# Patient Record
Sex: Female | Born: 1965 | Race: Black or African American | Hispanic: No | Smoking: Never smoker
Health system: Southern US, Community
[De-identification: ages and names within clinical notes are randomized; demographics above are authoritative.]

## PROBLEM LIST (undated history)

## (undated) DIAGNOSIS — R609 Edema, unspecified: Secondary | ICD-10-CM

## (undated) DIAGNOSIS — I519 Heart disease, unspecified: Secondary | ICD-10-CM

## (undated) DIAGNOSIS — I5189 Other ill-defined heart diseases: Secondary | ICD-10-CM

## (undated) DIAGNOSIS — I10 Essential (primary) hypertension: Secondary | ICD-10-CM

## (undated) DIAGNOSIS — E785 Hyperlipidemia, unspecified: Secondary | ICD-10-CM

## (undated) DIAGNOSIS — F329 Major depressive disorder, single episode, unspecified: Secondary | ICD-10-CM

## (undated) DIAGNOSIS — J302 Other seasonal allergic rhinitis: Secondary | ICD-10-CM

## (undated) DIAGNOSIS — J4520 Mild intermittent asthma, uncomplicated: Secondary | ICD-10-CM

## (undated) DIAGNOSIS — M1990 Unspecified osteoarthritis, unspecified site: Secondary | ICD-10-CM

## (undated) DIAGNOSIS — G4730 Sleep apnea, unspecified: Secondary | ICD-10-CM

## (undated) DIAGNOSIS — F32A Depression, unspecified: Secondary | ICD-10-CM

## (undated) DIAGNOSIS — E669 Obesity, unspecified: Secondary | ICD-10-CM

## (undated) DIAGNOSIS — J309 Allergic rhinitis, unspecified: Secondary | ICD-10-CM

## (undated) DIAGNOSIS — E1169 Type 2 diabetes mellitus with other specified complication: Secondary | ICD-10-CM

## (undated) DIAGNOSIS — F419 Anxiety disorder, unspecified: Secondary | ICD-10-CM

## (undated) DIAGNOSIS — T4145XA Adverse effect of unspecified anesthetic, initial encounter: Secondary | ICD-10-CM

## (undated) DIAGNOSIS — T8859XA Other complications of anesthesia, initial encounter: Secondary | ICD-10-CM

## (undated) DIAGNOSIS — J189 Pneumonia, unspecified organism: Secondary | ICD-10-CM

## (undated) DIAGNOSIS — D649 Anemia, unspecified: Secondary | ICD-10-CM

## (undated) HISTORY — DX: Edema, unspecified: R60.9

## (undated) HISTORY — PX: ABDOMINAL SURGERY: SHX537

## (undated) HISTORY — PX: KNEE ARTHROSCOPY: SUR90

## (undated) HISTORY — DX: Depression, unspecified: F32.A

## (undated) HISTORY — DX: Type 2 diabetes mellitus with other specified complication: E11.69

## (undated) HISTORY — DX: Heart disease, unspecified: I51.9

## (undated) HISTORY — DX: Other seasonal allergic rhinitis: J30.2

## (undated) HISTORY — DX: Major depressive disorder, single episode, unspecified: F32.9

## (undated) HISTORY — DX: Hyperlipidemia, unspecified: E78.5

## (undated) HISTORY — DX: Anxiety disorder, unspecified: F41.9

## (undated) HISTORY — PX: CHOLECYSTECTOMY: SHX55

## (undated) HISTORY — DX: Other ill-defined heart diseases: I51.89

## (undated) HISTORY — PX: OOPHORECTOMY: SHX86

## (undated) HISTORY — DX: Morbid (severe) obesity due to excess calories: E66.01

## (undated) HISTORY — PX: OTHER SURGICAL HISTORY: SHX169

## (undated) HISTORY — DX: Type 2 diabetes mellitus with other specified complication: E66.9

## (undated) HISTORY — DX: Allergic rhinitis, unspecified: J30.9

## (undated) HISTORY — DX: Mild intermittent asthma, uncomplicated: J45.20

## (undated) HISTORY — DX: Anemia, unspecified: D64.9

---

## 1997-08-29 ENCOUNTER — Ambulatory Visit (HOSPITAL_BASED_OUTPATIENT_CLINIC_OR_DEPARTMENT_OTHER): Admission: RE | Admit: 1997-08-29 | Discharge: 1997-08-29 | Payer: Self-pay | Admitting: *Deleted

## 1998-09-05 ENCOUNTER — Ambulatory Visit (HOSPITAL_COMMUNITY): Admission: RE | Admit: 1998-09-05 | Discharge: 1998-09-05 | Payer: Self-pay | Admitting: *Deleted

## 1998-10-03 ENCOUNTER — Other Ambulatory Visit: Admission: RE | Admit: 1998-10-03 | Discharge: 1998-10-03 | Payer: Self-pay | Admitting: *Deleted

## 1999-02-11 ENCOUNTER — Encounter: Admission: RE | Admit: 1999-02-11 | Discharge: 1999-02-11 | Payer: Self-pay | Admitting: Hematology and Oncology

## 1999-04-02 ENCOUNTER — Encounter: Admission: RE | Admit: 1999-04-02 | Discharge: 1999-04-02 | Payer: Self-pay | Admitting: Internal Medicine

## 1999-06-04 ENCOUNTER — Encounter: Admission: RE | Admit: 1999-06-04 | Discharge: 1999-06-04 | Payer: Self-pay | Admitting: Hematology and Oncology

## 2000-05-07 ENCOUNTER — Inpatient Hospital Stay (HOSPITAL_COMMUNITY): Admission: AD | Admit: 2000-05-07 | Discharge: 2000-05-07 | Payer: Self-pay | Admitting: *Deleted

## 2000-05-08 ENCOUNTER — Encounter: Payer: Self-pay | Admitting: Obstetrics

## 2000-05-08 ENCOUNTER — Ambulatory Visit (HOSPITAL_COMMUNITY): Admission: RE | Admit: 2000-05-08 | Discharge: 2000-05-08 | Payer: Self-pay | Admitting: Obstetrics

## 2000-06-07 ENCOUNTER — Other Ambulatory Visit: Admission: RE | Admit: 2000-06-07 | Discharge: 2000-06-07 | Payer: Self-pay | Admitting: Obstetrics and Gynecology

## 2000-06-13 ENCOUNTER — Inpatient Hospital Stay (HOSPITAL_COMMUNITY): Admission: AD | Admit: 2000-06-13 | Discharge: 2000-06-13 | Payer: Self-pay | Admitting: Obstetrics and Gynecology

## 2000-07-02 ENCOUNTER — Inpatient Hospital Stay (HOSPITAL_COMMUNITY): Admission: AD | Admit: 2000-07-02 | Discharge: 2000-07-02 | Payer: Self-pay | Admitting: Obstetrics and Gynecology

## 2000-07-29 ENCOUNTER — Ambulatory Visit (HOSPITAL_COMMUNITY): Admission: RE | Admit: 2000-07-29 | Discharge: 2000-07-29 | Payer: Self-pay | Admitting: Obstetrics and Gynecology

## 2000-07-29 ENCOUNTER — Encounter: Payer: Self-pay | Admitting: Obstetrics and Gynecology

## 2000-10-18 ENCOUNTER — Ambulatory Visit (HOSPITAL_COMMUNITY): Admission: RE | Admit: 2000-10-18 | Discharge: 2000-10-18 | Payer: Self-pay | Admitting: Obstetrics and Gynecology

## 2000-10-18 ENCOUNTER — Encounter: Payer: Self-pay | Admitting: Obstetrics and Gynecology

## 2000-11-15 ENCOUNTER — Inpatient Hospital Stay (HOSPITAL_COMMUNITY): Admission: AD | Admit: 2000-11-15 | Discharge: 2000-11-15 | Payer: Self-pay | Admitting: Obstetrics and Gynecology

## 2000-11-15 ENCOUNTER — Encounter: Payer: Self-pay | Admitting: Obstetrics and Gynecology

## 2000-11-18 ENCOUNTER — Encounter (HOSPITAL_COMMUNITY): Admission: RE | Admit: 2000-11-18 | Discharge: 2000-12-18 | Payer: Self-pay | Admitting: Obstetrics and Gynecology

## 2000-11-26 ENCOUNTER — Encounter: Payer: Self-pay | Admitting: Obstetrics and Gynecology

## 2000-12-08 ENCOUNTER — Observation Stay (HOSPITAL_COMMUNITY): Admission: AD | Admit: 2000-12-08 | Discharge: 2000-12-09 | Payer: Self-pay | Admitting: Obstetrics and Gynecology

## 2000-12-09 ENCOUNTER — Encounter: Payer: Self-pay | Admitting: Obstetrics and Gynecology

## 2000-12-12 ENCOUNTER — Inpatient Hospital Stay (HOSPITAL_COMMUNITY): Admission: AD | Admit: 2000-12-12 | Discharge: 2000-12-12 | Payer: Self-pay | Admitting: Obstetrics and Gynecology

## 2000-12-14 ENCOUNTER — Encounter: Payer: Self-pay | Admitting: Obstetrics and Gynecology

## 2000-12-14 ENCOUNTER — Inpatient Hospital Stay (HOSPITAL_COMMUNITY): Admission: AD | Admit: 2000-12-14 | Discharge: 2000-12-14 | Payer: Self-pay | Admitting: Obstetrics and Gynecology

## 2000-12-17 ENCOUNTER — Encounter: Payer: Self-pay | Admitting: Obstetrics and Gynecology

## 2000-12-21 ENCOUNTER — Encounter (INDEPENDENT_AMBULATORY_CARE_PROVIDER_SITE_OTHER): Payer: Self-pay

## 2000-12-21 ENCOUNTER — Inpatient Hospital Stay (HOSPITAL_COMMUNITY): Admission: AD | Admit: 2000-12-21 | Discharge: 2000-12-24 | Payer: Self-pay | Admitting: Obstetrics and Gynecology

## 2000-12-25 ENCOUNTER — Observation Stay (HOSPITAL_COMMUNITY): Admission: AD | Admit: 2000-12-25 | Discharge: 2000-12-26 | Payer: Self-pay | Admitting: Obstetrics and Gynecology

## 2000-12-25 ENCOUNTER — Encounter: Payer: Self-pay | Admitting: Obstetrics and Gynecology

## 2000-12-26 ENCOUNTER — Encounter: Payer: Self-pay | Admitting: Obstetrics and Gynecology

## 2001-01-15 ENCOUNTER — Inpatient Hospital Stay (HOSPITAL_COMMUNITY): Admission: AD | Admit: 2001-01-15 | Discharge: 2001-01-15 | Payer: Self-pay | Admitting: Obstetrics and Gynecology

## 2001-01-19 ENCOUNTER — Encounter (INDEPENDENT_AMBULATORY_CARE_PROVIDER_SITE_OTHER): Payer: Self-pay | Admitting: *Deleted

## 2001-01-19 ENCOUNTER — Ambulatory Visit (HOSPITAL_COMMUNITY): Admission: RE | Admit: 2001-01-19 | Discharge: 2001-01-19 | Payer: Self-pay | Admitting: Obstetrics and Gynecology

## 2001-01-19 ENCOUNTER — Encounter: Payer: Self-pay | Admitting: Obstetrics and Gynecology

## 2001-01-25 ENCOUNTER — Encounter (HOSPITAL_BASED_OUTPATIENT_CLINIC_OR_DEPARTMENT_OTHER): Payer: Self-pay | Admitting: General Surgery

## 2001-01-26 ENCOUNTER — Ambulatory Visit (HOSPITAL_COMMUNITY): Admission: RE | Admit: 2001-01-26 | Discharge: 2001-01-27 | Payer: Self-pay | Admitting: General Surgery

## 2001-01-26 ENCOUNTER — Encounter (INDEPENDENT_AMBULATORY_CARE_PROVIDER_SITE_OTHER): Payer: Self-pay | Admitting: *Deleted

## 2001-01-26 ENCOUNTER — Encounter (HOSPITAL_BASED_OUTPATIENT_CLINIC_OR_DEPARTMENT_OTHER): Payer: Self-pay | Admitting: General Surgery

## 2001-05-03 ENCOUNTER — Encounter: Admission: RE | Admit: 2001-05-03 | Discharge: 2001-05-03 | Payer: Self-pay

## 2002-03-10 ENCOUNTER — Encounter (INDEPENDENT_AMBULATORY_CARE_PROVIDER_SITE_OTHER): Payer: Self-pay | Admitting: *Deleted

## 2002-03-10 ENCOUNTER — Encounter: Payer: Self-pay | Admitting: Endocrinology

## 2002-03-10 ENCOUNTER — Encounter: Admission: RE | Admit: 2002-03-10 | Discharge: 2002-03-10 | Payer: Self-pay | Admitting: Endocrinology

## 2002-04-11 ENCOUNTER — Emergency Department (HOSPITAL_COMMUNITY): Admission: EM | Admit: 2002-04-11 | Discharge: 2002-04-11 | Payer: Self-pay | Admitting: *Deleted

## 2002-04-12 ENCOUNTER — Encounter: Payer: Self-pay | Admitting: Emergency Medicine

## 2002-09-27 ENCOUNTER — Emergency Department (HOSPITAL_COMMUNITY): Admission: EM | Admit: 2002-09-27 | Discharge: 2002-09-27 | Payer: Self-pay | Admitting: Emergency Medicine

## 2002-09-27 ENCOUNTER — Encounter: Payer: Self-pay | Admitting: Emergency Medicine

## 2002-10-05 ENCOUNTER — Emergency Department (HOSPITAL_COMMUNITY): Admission: EM | Admit: 2002-10-05 | Discharge: 2002-10-05 | Payer: Self-pay | Admitting: Emergency Medicine

## 2002-10-06 ENCOUNTER — Emergency Department (HOSPITAL_COMMUNITY): Admission: EM | Admit: 2002-10-06 | Discharge: 2002-10-06 | Payer: Self-pay | Admitting: Emergency Medicine

## 2002-10-06 ENCOUNTER — Encounter (INDEPENDENT_AMBULATORY_CARE_PROVIDER_SITE_OTHER): Payer: Self-pay | Admitting: *Deleted

## 2002-10-06 ENCOUNTER — Encounter: Payer: Self-pay | Admitting: Emergency Medicine

## 2002-10-12 ENCOUNTER — Encounter (INDEPENDENT_AMBULATORY_CARE_PROVIDER_SITE_OTHER): Payer: Self-pay | Admitting: *Deleted

## 2002-10-12 ENCOUNTER — Encounter: Payer: Self-pay | Admitting: Gastroenterology

## 2002-10-12 ENCOUNTER — Encounter: Admission: RE | Admit: 2002-10-12 | Discharge: 2002-10-12 | Payer: Self-pay | Admitting: Gastroenterology

## 2002-12-25 ENCOUNTER — Encounter: Admission: RE | Admit: 2002-12-25 | Discharge: 2002-12-25 | Payer: Self-pay | Admitting: Orthopedic Surgery

## 2002-12-25 ENCOUNTER — Encounter: Payer: Self-pay | Admitting: Orthopedic Surgery

## 2004-01-02 ENCOUNTER — Emergency Department (HOSPITAL_COMMUNITY): Admission: EM | Admit: 2004-01-02 | Discharge: 2004-01-02 | Payer: Self-pay | Admitting: Emergency Medicine

## 2004-03-03 ENCOUNTER — Inpatient Hospital Stay (HOSPITAL_COMMUNITY): Admission: AD | Admit: 2004-03-03 | Discharge: 2004-03-03 | Payer: Self-pay | Admitting: Obstetrics & Gynecology

## 2004-03-14 ENCOUNTER — Other Ambulatory Visit: Admission: RE | Admit: 2004-03-14 | Discharge: 2004-03-14 | Payer: Self-pay | Admitting: Gynecology

## 2004-03-20 ENCOUNTER — Emergency Department (HOSPITAL_COMMUNITY): Admission: EM | Admit: 2004-03-20 | Discharge: 2004-03-20 | Payer: Self-pay | Admitting: Emergency Medicine

## 2004-03-22 ENCOUNTER — Emergency Department (HOSPITAL_COMMUNITY): Admission: EM | Admit: 2004-03-22 | Discharge: 2004-03-23 | Payer: Self-pay | Admitting: Emergency Medicine

## 2004-03-25 ENCOUNTER — Emergency Department (HOSPITAL_COMMUNITY): Admission: EM | Admit: 2004-03-25 | Discharge: 2004-03-26 | Payer: Self-pay | Admitting: Emergency Medicine

## 2004-03-27 ENCOUNTER — Ambulatory Visit: Payer: Self-pay | Admitting: Internal Medicine

## 2004-03-30 ENCOUNTER — Ambulatory Visit (HOSPITAL_COMMUNITY): Admission: RE | Admit: 2004-03-30 | Discharge: 2004-03-30 | Payer: Self-pay | Admitting: Internal Medicine

## 2004-05-07 ENCOUNTER — Ambulatory Visit: Payer: Self-pay | Admitting: Internal Medicine

## 2004-08-22 ENCOUNTER — Ambulatory Visit: Payer: Self-pay | Admitting: Internal Medicine

## 2004-09-17 ENCOUNTER — Ambulatory Visit: Payer: Self-pay | Admitting: Internal Medicine

## 2004-10-31 ENCOUNTER — Ambulatory Visit: Payer: Self-pay | Admitting: Internal Medicine

## 2005-01-12 ENCOUNTER — Ambulatory Visit: Payer: Self-pay | Admitting: Internal Medicine

## 2005-04-16 ENCOUNTER — Ambulatory Visit: Payer: Self-pay | Admitting: Internal Medicine

## 2005-05-12 ENCOUNTER — Ambulatory Visit: Payer: Self-pay | Admitting: Internal Medicine

## 2005-07-24 ENCOUNTER — Ambulatory Visit: Payer: Self-pay | Admitting: Internal Medicine

## 2005-08-19 ENCOUNTER — Ambulatory Visit: Payer: Self-pay | Admitting: Internal Medicine

## 2005-08-26 ENCOUNTER — Ambulatory Visit: Payer: Self-pay | Admitting: Internal Medicine

## 2005-08-27 ENCOUNTER — Encounter: Admission: RE | Admit: 2005-08-27 | Discharge: 2005-08-27 | Payer: Self-pay | Admitting: Internal Medicine

## 2006-02-14 ENCOUNTER — Emergency Department (HOSPITAL_COMMUNITY): Admission: EM | Admit: 2006-02-14 | Discharge: 2006-02-15 | Payer: Self-pay | Admitting: *Deleted

## 2006-03-25 ENCOUNTER — Ambulatory Visit: Payer: Self-pay | Admitting: Internal Medicine

## 2006-05-06 ENCOUNTER — Ambulatory Visit: Payer: Self-pay | Admitting: Internal Medicine

## 2006-05-20 ENCOUNTER — Other Ambulatory Visit: Admission: RE | Admit: 2006-05-20 | Discharge: 2006-05-20 | Payer: Self-pay | Admitting: Gynecology

## 2006-08-23 ENCOUNTER — Ambulatory Visit: Payer: Self-pay | Admitting: Internal Medicine

## 2006-08-23 LAB — CONVERTED CEMR LAB
ALT: 15 units/L (ref 0–40)
AST: 16 units/L (ref 0–37)
Albumin: 3.1 g/dL — ABNORMAL LOW (ref 3.5–5.2)
Alkaline Phosphatase: 81 units/L (ref 39–117)
BUN: 8 mg/dL (ref 6–23)
Bacteria, UA: NEGATIVE
Basophils Absolute: 0 10*3/uL (ref 0.0–0.1)
Basophils Relative: 0.2 % (ref 0.0–1.0)
Bilirubin Urine: NEGATIVE
Bilirubin, Direct: 0.1 mg/dL (ref 0.0–0.3)
CO2: 30 meq/L (ref 19–32)
Calcium: 8.9 mg/dL (ref 8.4–10.5)
Chloride: 107 meq/L (ref 96–112)
Cholesterol: 194 mg/dL (ref 0–200)
Creatinine, Ser: 0.6 mg/dL (ref 0.4–1.2)
Creatinine,U: 31.6 mg/dL
Crystals: NEGATIVE
Eosinophils Absolute: 0.2 10*3/uL (ref 0.0–0.6)
Eosinophils Relative: 2.8 % (ref 0.0–5.0)
Folate: 5.1 ng/mL
GFR calc Af Amer: 144 mL/min
GFR calc non Af Amer: 119 mL/min
Glucose, Bld: 165 mg/dL — ABNORMAL HIGH (ref 70–99)
HCT: 35.9 % — ABNORMAL LOW (ref 36.0–46.0)
HDL: 33.3 mg/dL — ABNORMAL LOW (ref >39.0)
Hemoglobin: 12.3 g/dL (ref 12.0–15.0)
Hgb A1c MFr Bld: 8.4 % — ABNORMAL HIGH (ref 4.6–6.0)
Iron: 47 ug/dL (ref 42–145)
Ketones, ur: NEGATIVE mg/dL
LDL Cholesterol: 147 mg/dL — ABNORMAL HIGH (ref 0–99)
Leukocytes, UA: NEGATIVE
Lymphocytes Relative: 27.2 % (ref 12.0–46.0)
MCHC: 34.3 g/dL (ref 30.0–36.0)
MCV: 92.9 fL (ref 78.0–100.0)
Microalb Creat Ratio: 9.5 mg/g (ref 0.0–30.0)
Microalb, Ur: 0.3 mg/dL (ref 0.0–1.9)
Monocytes Absolute: 0.4 10*3/uL (ref 0.2–0.7)
Monocytes Relative: 4.7 % (ref 3.0–11.0)
Mucus, UA: NEGATIVE
Neutro Abs: 5.4 10*3/uL (ref 1.4–7.7)
Neutrophils Relative %: 65.1 % (ref 43.0–77.0)
Nitrite: NEGATIVE
Platelets: 430 10*3/uL — ABNORMAL HIGH (ref 150–400)
Potassium: 3.8 meq/L (ref 3.5–5.1)
RBC / HPF: NONE SEEN
RBC: 3.86 M/uL — ABNORMAL LOW (ref 3.87–5.11)
RDW: 12.7 % (ref 11.5–14.6)
Saturation Ratios: 14.9 % — ABNORMAL LOW (ref 20.0–50.0)
Sodium: 142 meq/L (ref 135–145)
Specific Gravity, Urine: 1.01 (ref 1.000–1.03)
TSH: 0.7 microintl units/mL (ref 0.35–5.50)
Total Bilirubin: 0.5 mg/dL (ref 0.3–1.2)
Total CHOL/HDL Ratio: 5.8
Total Protein, Urine: NEGATIVE mg/dL
Total Protein: 7.4 g/dL (ref 6.0–8.3)
Transferrin: 226 mg/dL (ref >=212.0)
Triglycerides: 71 mg/dL (ref 0–149)
Urine Glucose: NEGATIVE mg/dL
Urobilinogen, UA: 0.2 (ref 0.0–1.0)
VLDL: 14 mg/dL (ref 0–40)
Vitamin B-12: 406 pg/mL (ref 211–911)
WBC: 8.3 10*3/uL (ref 4.5–10.5)
pH: 6.5 (ref 5.0–8.0)

## 2006-09-28 ENCOUNTER — Ambulatory Visit: Payer: Self-pay | Admitting: Pulmonary Disease

## 2006-11-13 DIAGNOSIS — R51 Headache: Secondary | ICD-10-CM | POA: Insufficient documentation

## 2006-11-13 DIAGNOSIS — R519 Headache, unspecified: Secondary | ICD-10-CM | POA: Insufficient documentation

## 2006-12-14 ENCOUNTER — Ambulatory Visit: Payer: Self-pay | Admitting: Internal Medicine

## 2007-01-13 ENCOUNTER — Ambulatory Visit: Payer: Self-pay | Admitting: Internal Medicine

## 2007-01-13 LAB — CONVERTED CEMR LAB
ALT: 12 units/L (ref 0–35)
AST: 16 units/L (ref 0–37)
Albumin: 2.7 g/dL — ABNORMAL LOW (ref 3.5–5.2)
Alkaline Phosphatase: 72 units/L (ref 39–117)
BUN: 8 mg/dL (ref 6–23)
Basophils Absolute: 0 10*3/uL (ref 0.0–0.1)
Basophils Relative: 0.5 % (ref 0.0–1.0)
Bilirubin, Direct: 0.1 mg/dL (ref 0.0–0.3)
CO2: 29 meq/L (ref 19–32)
Calcium: 8.6 mg/dL (ref 8.4–10.5)
Chloride: 108 meq/L (ref 96–112)
Creatinine, Ser: 0.5 mg/dL (ref 0.4–1.2)
Eosinophils Absolute: 0.3 10*3/uL (ref 0.0–0.6)
Eosinophils Relative: 3.5 % (ref 0.0–5.0)
Folate: 6 ng/mL
GFR calc Af Amer: 177 mL/min
GFR calc non Af Amer: 146 mL/min
Glucose, Bld: 159 mg/dL — ABNORMAL HIGH (ref 70–99)
HCT: 29.7 % — ABNORMAL LOW (ref 36.0–46.0)
Hemoglobin: 10.1 g/dL — ABNORMAL LOW (ref 12.0–15.0)
Hgb A1c MFr Bld: 7.6 % — ABNORMAL HIGH (ref 4.6–6.0)
Iron: 24 ug/dL — ABNORMAL LOW (ref 42–145)
Lymphocytes Relative: 33.7 % (ref 12.0–46.0)
MCHC: 34.1 g/dL (ref 30.0–36.0)
MCV: 85.8 fL (ref 78.0–100.0)
Monocytes Absolute: 0.5 10*3/uL (ref 0.2–0.7)
Monocytes Relative: 4.7 % (ref 3.0–11.0)
Neutro Abs: 5.6 10*3/uL (ref 1.4–7.7)
Neutrophils Relative %: 57.6 % (ref 43.0–77.0)
Platelets: 483 10*3/uL — ABNORMAL HIGH (ref 150–400)
Potassium: 4 meq/L (ref 3.5–5.1)
RBC: 3.46 M/uL — ABNORMAL LOW (ref 3.87–5.11)
RDW: 13.7 % (ref 11.5–14.6)
Saturation Ratios: 5.9 % — ABNORMAL LOW (ref 20.0–50.0)
Sodium: 141 meq/L (ref 135–145)
TSH: 1.13 microintl units/mL (ref 0.35–5.50)
Total Bilirubin: 0.4 mg/dL (ref 0.3–1.2)
Total Protein: 6.7 g/dL (ref 6.0–8.3)
Transferrin: 289.3 mg/dL (ref >=212.0)
Vitamin B-12: 225 pg/mL (ref 211–911)
WBC: 9.6 10*3/uL (ref 4.5–10.5)

## 2007-02-05 ENCOUNTER — Emergency Department (HOSPITAL_COMMUNITY): Admission: EM | Admit: 2007-02-05 | Discharge: 2007-02-05 | Payer: Self-pay | Admitting: Emergency Medicine

## 2007-03-21 ENCOUNTER — Telehealth (INDEPENDENT_AMBULATORY_CARE_PROVIDER_SITE_OTHER): Payer: Self-pay | Admitting: *Deleted

## 2007-03-22 DIAGNOSIS — A6000 Herpesviral infection of urogenital system, unspecified: Secondary | ICD-10-CM | POA: Insufficient documentation

## 2007-03-22 DIAGNOSIS — I10 Essential (primary) hypertension: Secondary | ICD-10-CM | POA: Insufficient documentation

## 2007-03-22 DIAGNOSIS — E119 Type 2 diabetes mellitus without complications: Secondary | ICD-10-CM | POA: Insufficient documentation

## 2007-03-22 DIAGNOSIS — F329 Major depressive disorder, single episode, unspecified: Secondary | ICD-10-CM | POA: Insufficient documentation

## 2007-03-22 DIAGNOSIS — J309 Allergic rhinitis, unspecified: Secondary | ICD-10-CM | POA: Insufficient documentation

## 2007-03-22 DIAGNOSIS — E785 Hyperlipidemia, unspecified: Secondary | ICD-10-CM

## 2007-03-22 DIAGNOSIS — M5030 Other cervical disc degeneration, unspecified cervical region: Secondary | ICD-10-CM | POA: Insufficient documentation

## 2007-03-22 DIAGNOSIS — D509 Iron deficiency anemia, unspecified: Secondary | ICD-10-CM | POA: Insufficient documentation

## 2007-03-22 DIAGNOSIS — E1169 Type 2 diabetes mellitus with other specified complication: Secondary | ICD-10-CM | POA: Insufficient documentation

## 2007-03-22 DIAGNOSIS — F419 Anxiety disorder, unspecified: Secondary | ICD-10-CM | POA: Insufficient documentation

## 2007-06-18 ENCOUNTER — Ambulatory Visit: Payer: Self-pay | Admitting: Internal Medicine

## 2007-09-26 ENCOUNTER — Ambulatory Visit: Payer: Self-pay | Admitting: Internal Medicine

## 2007-10-06 ENCOUNTER — Emergency Department (HOSPITAL_COMMUNITY): Admission: EM | Admit: 2007-10-06 | Discharge: 2007-10-06 | Payer: Self-pay | Admitting: Emergency Medicine

## 2008-01-30 ENCOUNTER — Ambulatory Visit: Payer: Self-pay | Admitting: Internal Medicine

## 2008-01-31 ENCOUNTER — Telehealth: Payer: Self-pay | Admitting: Family Medicine

## 2008-04-19 ENCOUNTER — Ambulatory Visit: Payer: Self-pay | Admitting: Internal Medicine

## 2008-04-19 LAB — CONVERTED CEMR LAB: Rapid Strep: POSITIVE

## 2008-06-19 ENCOUNTER — Emergency Department (HOSPITAL_COMMUNITY): Admission: EM | Admit: 2008-06-19 | Discharge: 2008-06-19 | Payer: Self-pay | Admitting: Emergency Medicine

## 2008-09-05 ENCOUNTER — Telehealth: Payer: Self-pay | Admitting: Internal Medicine

## 2008-09-06 ENCOUNTER — Telehealth: Payer: Self-pay | Admitting: Internal Medicine

## 2008-09-18 ENCOUNTER — Telehealth: Payer: Self-pay | Admitting: Internal Medicine

## 2008-09-18 ENCOUNTER — Ambulatory Visit: Payer: Self-pay | Admitting: Internal Medicine

## 2008-12-13 ENCOUNTER — Emergency Department (HOSPITAL_COMMUNITY): Admission: EM | Admit: 2008-12-13 | Discharge: 2008-12-13 | Payer: Self-pay | Admitting: Emergency Medicine

## 2008-12-14 ENCOUNTER — Ambulatory Visit: Payer: Self-pay | Admitting: Internal Medicine

## 2008-12-17 ENCOUNTER — Telehealth: Payer: Self-pay | Admitting: Internal Medicine

## 2008-12-17 DIAGNOSIS — R42 Dizziness and giddiness: Secondary | ICD-10-CM | POA: Insufficient documentation

## 2008-12-18 ENCOUNTER — Encounter: Payer: Self-pay | Admitting: Internal Medicine

## 2008-12-20 ENCOUNTER — Telehealth: Payer: Self-pay | Admitting: Internal Medicine

## 2008-12-21 ENCOUNTER — Ambulatory Visit: Payer: Self-pay | Admitting: Internal Medicine

## 2008-12-21 ENCOUNTER — Encounter: Payer: Self-pay | Admitting: Internal Medicine

## 2008-12-21 DIAGNOSIS — R002 Palpitations: Secondary | ICD-10-CM | POA: Insufficient documentation

## 2008-12-24 ENCOUNTER — Encounter (INDEPENDENT_AMBULATORY_CARE_PROVIDER_SITE_OTHER): Payer: Self-pay | Admitting: *Deleted

## 2009-01-01 ENCOUNTER — Telehealth: Payer: Self-pay | Admitting: Internal Medicine

## 2009-01-03 ENCOUNTER — Ambulatory Visit: Payer: Self-pay | Admitting: Internal Medicine

## 2009-01-03 DIAGNOSIS — G4700 Insomnia, unspecified: Secondary | ICD-10-CM | POA: Insufficient documentation

## 2009-01-03 DIAGNOSIS — R609 Edema, unspecified: Secondary | ICD-10-CM | POA: Insufficient documentation

## 2009-01-29 ENCOUNTER — Ambulatory Visit: Payer: Self-pay | Admitting: Internal Medicine

## 2009-01-29 DIAGNOSIS — E876 Hypokalemia: Secondary | ICD-10-CM | POA: Insufficient documentation

## 2009-01-29 DIAGNOSIS — R197 Diarrhea, unspecified: Secondary | ICD-10-CM | POA: Insufficient documentation

## 2009-01-29 LAB — CONVERTED CEMR LAB
BUN: 12 mg/dL (ref 6–23)
CO2: 28 meq/L (ref 19–32)
Calcium: 8.8 mg/dL (ref 8.4–10.5)
Chloride: 101 meq/L (ref 96–112)
Cholesterol: 176 mg/dL (ref 0–200)
Creatinine, Ser: 0.8 mg/dL (ref 0.4–1.2)
GFR calc non Af Amer: 101.54 mL/min (ref >60)
HDL: 35.1 mg/dL — ABNORMAL LOW (ref >39.00)
Hgb A1c MFr Bld: 7.6 % — ABNORMAL HIGH (ref 4.6–6.5)
LDL Cholesterol: 120 mg/dL — ABNORMAL HIGH (ref 0–99)
Potassium: 3.3 meq/L — ABNORMAL LOW (ref 3.5–5.1)
Sodium: 137 meq/L (ref 135–145)
Total CHOL/HDL Ratio: 5
Triglycerides: 104 mg/dL (ref 0.0–149.0)

## 2009-02-01 ENCOUNTER — Telehealth: Payer: Self-pay | Admitting: Internal Medicine

## 2009-02-05 ENCOUNTER — Ambulatory Visit: Payer: Self-pay | Admitting: Internal Medicine

## 2009-02-05 DIAGNOSIS — IMO0002 Reserved for concepts with insufficient information to code with codable children: Secondary | ICD-10-CM | POA: Insufficient documentation

## 2009-02-20 ENCOUNTER — Ambulatory Visit: Payer: Self-pay | Admitting: Internal Medicine

## 2009-03-06 ENCOUNTER — Encounter (INDEPENDENT_AMBULATORY_CARE_PROVIDER_SITE_OTHER): Payer: Self-pay | Admitting: *Deleted

## 2009-03-28 ENCOUNTER — Telehealth: Payer: Self-pay | Admitting: Internal Medicine

## 2009-04-02 ENCOUNTER — Telehealth: Payer: Self-pay | Admitting: Internal Medicine

## 2009-04-22 ENCOUNTER — Telehealth: Payer: Self-pay | Admitting: Internal Medicine

## 2009-04-25 ENCOUNTER — Telehealth: Payer: Self-pay | Admitting: Internal Medicine

## 2009-05-03 ENCOUNTER — Ambulatory Visit: Payer: Self-pay | Admitting: Internal Medicine

## 2009-05-20 ENCOUNTER — Telehealth: Payer: Self-pay | Admitting: Internal Medicine

## 2009-05-21 ENCOUNTER — Encounter: Payer: Self-pay | Admitting: Internal Medicine

## 2009-05-21 ENCOUNTER — Encounter: Admission: RE | Admit: 2009-05-21 | Discharge: 2009-05-21 | Payer: Self-pay | Admitting: Orthopaedic Surgery

## 2009-07-17 ENCOUNTER — Ambulatory Visit: Payer: Self-pay | Admitting: Internal Medicine

## 2009-07-17 DIAGNOSIS — R300 Dysuria: Secondary | ICD-10-CM | POA: Insufficient documentation

## 2009-07-17 DIAGNOSIS — F4321 Adjustment disorder with depressed mood: Secondary | ICD-10-CM | POA: Insufficient documentation

## 2009-07-17 LAB — CONVERTED CEMR LAB
Bilirubin Urine: NEGATIVE
Blood in Urine, dipstick: NEGATIVE
Ketones, urine, test strip: NEGATIVE
Nitrite: NEGATIVE
Protein, U semiquant: NEGATIVE
Specific Gravity, Urine: <1.005
WBC Urine, dipstick: NEGATIVE
pH: 5

## 2009-07-22 ENCOUNTER — Telehealth: Payer: Self-pay | Admitting: Internal Medicine

## 2009-07-23 ENCOUNTER — Other Ambulatory Visit: Admission: RE | Admit: 2009-07-23 | Discharge: 2009-07-23 | Payer: Self-pay | Admitting: Gynecology

## 2009-07-23 ENCOUNTER — Ambulatory Visit: Payer: Self-pay | Admitting: Gynecology

## 2009-07-24 ENCOUNTER — Ambulatory Visit: Payer: Self-pay | Admitting: Gynecology

## 2009-08-16 ENCOUNTER — Encounter: Payer: Self-pay | Admitting: Internal Medicine

## 2009-09-24 ENCOUNTER — Telehealth: Payer: Self-pay | Admitting: Internal Medicine

## 2009-11-22 ENCOUNTER — Ambulatory Visit: Payer: Self-pay | Admitting: Internal Medicine

## 2009-12-04 ENCOUNTER — Ambulatory Visit: Payer: Self-pay | Admitting: Internal Medicine

## 2009-12-11 ENCOUNTER — Telehealth: Payer: Self-pay | Admitting: Pulmonary Disease

## 2009-12-26 ENCOUNTER — Encounter: Payer: Self-pay | Admitting: Pulmonary Disease

## 2010-03-06 ENCOUNTER — Ambulatory Visit: Payer: Self-pay | Admitting: Gynecology

## 2010-03-25 ENCOUNTER — Ambulatory Visit: Payer: Self-pay | Admitting: Gynecology

## 2010-05-16 ENCOUNTER — Telehealth: Payer: Self-pay | Admitting: Internal Medicine

## 2010-05-18 ENCOUNTER — Encounter: Payer: Self-pay | Admitting: Internal Medicine

## 2010-05-19 ENCOUNTER — Ambulatory Visit
Admission: RE | Admit: 2010-05-19 | Discharge: 2010-05-19 | Payer: Self-pay | Source: Home / Self Care | Attending: Internal Medicine | Admitting: Internal Medicine

## 2010-05-23 ENCOUNTER — Encounter (INDEPENDENT_AMBULATORY_CARE_PROVIDER_SITE_OTHER): Payer: Self-pay | Admitting: *Deleted

## 2010-05-25 LAB — CONVERTED CEMR LAB
BUN: 12 mg/dL
Basophils Absolute: 0.1 10*3/uL (ref 0.0–0.1)
Basophils Relative: 0.8 % (ref 0.0–3.0)
Chloride: 101 meq/L
Creatinine, Ser: 0.4 mg/dL
Eosinophils Absolute: 0.3 10*3/uL (ref 0.0–0.7)
Eosinophils Relative: 3.5 % (ref 0.0–5.0)
Glucose, Bld: 192 mg/dL
HCT: 36.4 % (ref 36.0–46.0)
HCT: 39 %
Hemoglobin: 12.5 g/dL (ref 12.0–15.0)
Hemoglobin: 13.3 g/dL
Lymphocytes Relative: 26.6 % (ref 12.0–46.0)
Lymphs Abs: 2.5 10*3/uL (ref 0.7–4.0)
MCHC: 34.3 g/dL (ref 30.0–36.0)
MCV: 91.2 fL (ref 78.0–100.0)
Monocytes Absolute: 0.5 10*3/uL (ref 0.1–1.0)
Monocytes Relative: 5.6 % (ref 3.0–12.0)
Neutro Abs: 5.9 10*3/uL (ref 1.4–7.7)
Neutrophils Relative %: 63.5 % (ref 43.0–77.0)
Platelets: 486 10*3/uL — ABNORMAL HIGH (ref 150.0–400.0)
Potassium: 3.7 meq/L
RBC: 3.99 M/uL (ref 3.87–5.11)
RDW: 14.1 % (ref 11.5–14.6)
Sodium: 138 meq/L
TSH: 1.17 microintl units/mL (ref 0.35–5.50)
WBC: 9.3 10*3/uL (ref 4.5–10.5)

## 2010-05-27 NOTE — Assessment & Plan Note (Signed)
Summary: EAR AND  PART OF NECK SWOLLEN--STC   Vital Signs:  Patient profile:   45 year old female Height:      66.5 inches (168.91 cm) Weight:      309.4 pounds (140.64 kg) O2 Sat:      97 % on Room air Temp:     98.4 degrees F (36.89 degrees C) oral Pulse rate:   88 / minute BP sitting:   118 / 70  (left arm) Cuff size:   large  Vitals Entered By: Orlan Leavens RMA (December 04, 2009 11:46 AM)  O2 Flow:  Room air CC: (R) Ear & neck is swollen Is Patient Diabetic? Yes Did you bring your meter with you today? No Pain Assessment Patient in pain? no        Primary Care Provider:  Newt Lukes MD  CC:  (R) Ear & neck is swollen.  History of Present Illness:  07/17/09 OV reviewed:: concerned with rapid hr, ?med changes needed has not f/u with sleep study. also reports death of her kids father and stress/ sadness with upcoming funeral, needs med to help sleep, ambin ineffective  seen 7/29 OV for similar prob: Headaches      This is a 45 year old woman who presents with Headaches.  The symptoms began 8-12 hrs ago.  On a scale of 1 to 10, the intensity is described as a 4.  surface of scalp is tender to touch  posterior right area behind ear and occipital region - ?precipitated by steroid injections into scalp for alopecia at [facility] by derm 4 days ago.  The patient reports nasal congestion and sinus pressure, but denies nausea, vomiting, tearing of eyes, sinus pain, photophobia, and phonophobia.  The headache is described as constant and throbbing.  The location of the pain is unilateral on the right and occipital.  The patient denies the following high-risk features: fever, neck pain/stiffness, vision loss or change, focal weakness, altered mental status, rash, trauma, pain worse with exertion, immunosuppression, concomitant infection, and anticoagulation use.  The headaches are precipitated by change in weather.  Prior treatment has included a narcotic.   Tx with 7 d levaquin for  poss infx now reports symptoms improved while on abx, but now worse than before pain in right ear - deep and superficial to touch also swollen submandib LN 4 days ago, improved with ibuprofen - now reduced in size but not normal  has sched sleep eval 12/10/09 for ?sleep study given apnea problems postop 07/2009 at [facility] (following gyn surg) - prev referred 12/2008 but cx appt  Current Medications (verified): 1)  Celexa 20 Mg Tabs (Citalopram Hydrobromide) .... Take 1 Tablet By Mouth Once A Day 2)  Coreg Cr 20 Mg Xr24h-Cap (Carvedilol Phosphate) .Marland Kitchen.. 1 By Mouth Once Daily 3)  Diovan 320 Mg Tabs (Valsartan) .Marland Kitchen.. 1 By Mouth Once Daily 4)  Klor-Con M20 20 Meq Cr-Tabs (Potassium Chloride Crys Cr) .Marland Kitchen.. 1 By Mouth Once Daily 5)  Lantus 100 Unit/ml Soln (Insulin Glargine) .... Inject Subcutaneously 80 Units At Bedtime 6)  Metformin Hcl 500 Mg Tb24 (Metformin Hcl) .... Take 2 Tablet By Mouth Twice A Day 7)  Novolog Flexpen 100 Unit/ml  Soln (Insulin Aspart) .... Take 18 Units Subcutaneously Three Times A Day/ac 8)  Torsemide 20 Mg Tabs (Torsemide) .Marland Kitchen.. 1 By Mouth Two Times A Day 9)  Proair Hfa 108 (90 Base) Mcg/act Aers (Albuterol Sulfate) .... 2 Puffs Four Times Per Day As Needed 10)  Valium  5 Mg Tabs (Diazepam) .... 1/2-1 Tab By Mouth At Bedtime As Needed 11)  Bayer Breeze 2 Test  Disk (Glucose Blood) .... Check Blood Sugars Two Times A Day  Allergies (verified): 1)  ! * Altace  Past History:  Past Medical History: morbid obesity Hypertension Anemia-iron deficiency Diabetes mellitus, type II, controlled c-spine disc dz Hyperlipidemia  Anxiety Depression Allergic rhinitis  genital herpes hx hx of + PPD alopecia areata  Review of Systems  The patient denies fever, vision loss, decreased hearing, hoarseness, headaches, and abdominal pain.    Physical Exam  General:  alert, appropriate dress, cooperative to examination, and overweight-appearing.   Eyes:  vision grossly intact; pupils  equal, round and reactive to light.  conjunctiva and lids normal.    Ears:  normal pinnae bilaterally, without erythema or swelling, mild tenderness to palpation along right side. L TM clear and R TM hazy but without erythema, effusion, or cerumen impaction. Hearing grossly normal bilaterally  Mouth:  teeth and gums in good repair; mucous membranes moist, without lesions or ulcers. oropharynx clear without exudate, no erythema. +PND Neck:  +marble size firm submandib LN, tender to palp - freely mobile Lungs:  normal respiratory effort, no intercostal retractions or use of accessory muscles; normal breath sounds bilaterally - no crackles and no wheezes.    Heart:  distant tachycardic rate, regular rhythm, no murmur, and no rub. BLE with trace edema. Neurologic:  alert & oriented X3 and cranial nerves II-XII symetrically intact.  strength normal in all extremities, sensation intact to light touch, and gait normal. speech fluent without dysarthria or aphasia; follows commands with good comprehension.    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 Take 1 tablet by mouth once a day 2)  Coreg Cr 20 Mg Xr24h-cap (Carvedilol phosphate) .Marland Kitchen.. 1 by mouth once daily 3)  Diovan 320 Mg Tabs (Valsartan) .Marland Kitchen.. 1 by mouth once daily 4)  Klor-con M20 20 Meq Cr-tabs (Potassium chloride crys cr) .Marland Kitchen.. 1 by mouth once daily 5)  Lantus 100 Unit/ml Soln (Insulin glargine) .... Inject subcutaneously 80 units at bedtime 6)  Metformin Hcl 500 Mg Tb24  (Metformin hcl) .... Take 2 tablet by mouth twice a day 7)  Novolog Flexpen 100 Unit/ml Soln (Insulin aspart) .... Take 18 units subcutaneously three times a day/ac 8)  Torsemide 20 Mg Tabs (Torsemide) .Marland Kitchen.. 1 by mouth two times a day 9)  Proair Hfa 108 (90 Base) Mcg/act Aers (Albuterol sulfate) .... 2 puffs four times per day as needed 10)  Valium 5 Mg Tabs (Diazepam) .... 1/2-1 tab by mouth at bedtime as needed 11)  Bayer Breeze 2 Test Disk (Glucose blood) .... Check blood sugars two times a day 12)  Levaquin 750 Mg Tabs (Levofloxacin) .Marland Kitchen.. 1 by mouth once daily x 3 weeks 13)  Flonase 50 Mcg/act Susp (Fluticasone propionate) .Marland Kitchen.. 1 spray each nostril  every morning  Patient Instructions: 1)  it was good to see you today. 2)  Levaquin x 3 weeks and Flonase nasal spray - your  prescriptions have been electronically submitted to your pharmacy. Please take as directed. Contact our office if you believe you're having problems with the medication(s).  3)  we'll make referral to [facility]. Our office will contact you regarding this appointment once made.  4)  Get plenty of rest, drink lots of clear liquids, use warm compress to tender area and use Tylenol or Ibuprofen for comfort. Return in 7-10 days if you're not better:sooner if you're feeling worse. 5)  also use childrens decongestants as directed (box label use) Prescriptions: FLONASE 50 MCG/ACT SUSP (FLUTICASONE PROPIONATE) 1 spray each nostril  every morning  #1 x 1   Entered and Authorized by:   Newt Lukes MD   Signed by:   Newt Lukes MD on 12/04/2009   Method used:   Electronically to        [phone]* (retail)       [address]       [place], [place]  [address]       Ph: [phone]       Fax: [phone]   RxID:   [id number] LEVAQUIN 750 MG TABS (LEVOFLOXACIN) 1 by mouth once daily x 3 weeks  #21 x 0   Entered and Authorized by:   Newt Lukes MD   Signed by:   Newt Lukes MD on  12/04/2009   Method used:   Electronically to        [facility] # [id number]* (retail)       [address]       [place], [place]  [address]       Ph: [phone]       Fax: [phone]   RxID:   [id number]

## 2010-05-27 NOTE — Progress Notes (Signed)
Summary: breeze 2  Phone Note Refill Request Message from:  Fax from Pharmacy on Sep 24, 2009 8:20 AM  Refills Requested: Medication #1:  Breeze 2 test disc 100's use as directed  Method Requested: Fax to Local Pharmacy Initial call taken by: Orlan Leavens,  Sep 24, 2009 8:20 AM  Follow-up for Phone Call        Faxed paper request back to pharmacy Follow-up by: Orlan Leavens,  Sep 24, 2009 8:22 AM    New/Updated Medications: BAYER BREEZE 2 TEST  DISK (GLUCOSE BLOOD) check blood sugars two times a day Prescriptions: BAYER BREEZE 2 TEST  DISK (GLUCOSE BLOOD) check blood sugars two times a day  #100 x 2   Entered by:   Orlan Leavens   Authorized by:   Newt Lukes MD   Signed by:   Orlan Leavens on 09/24/2009   Method used:   Historical   RxID:   [id number]

## 2010-05-27 NOTE — Miscellaneous (Signed)
Summary: Orders Update  Clinical Lists Changes  Orders: Added new Service order of No Show NS50 (NS50) - Signed 

## 2010-05-27 NOTE — Assessment & Plan Note (Signed)
Summary: STOMACH PAIN/ PAIN WHEN URINATES/NWS   Vital Signs:  Patient profile:   45 year old female Height:      66.5 inches (168.91 cm) Weight:      308.4 pounds (140.18 kg) O2 Sat:      98 % on Room air Temp:     97.0 degrees F (36.11 degrees C) oral Pulse rate:   116 / minute BP sitting:   104 / 60  (left arm) Cuff size:   large  Vitals Entered By: Orlan Leavens (July 17, 2009 4:05 PM)  O2 Flow:  Room air CC: Stomach pain when she urinate. Pt ? UTI, Abdominal pain Is Patient Diabetic? Yes Did you bring your meter with you today? No Pain Assessment Patient in pain? yes     Location: abdomen   Primary Care Provider:  Newt Lukes MD  CC:  Stomach pain when she urinate. Pt ? UTI and Abdominal pain.  History of Present Illness:  Abdominal Pain      This is a 45 year old woman who presents with Abdominal pain.  The symptoms began 1 week ago.  On a scale of 1 to 10, the intensity is described as a 4-5.  taking cranberry supp which seemed to worsen symptoms - then took OTC Azo which helped.  The patient denies nausea, vomiting, diarrhea, constipation, and anorexia.  The location of the pain is suprapubic.  The pain is described as intermittent and dull.  Associated symptoms include dysuria.  The patient denies the following symptoms: fever, weight loss, chest pain, dark urine, and vaginal bleeding.  The pain is worse with lying down.  The pain is better with antispasmodics.    also still concerned with rapid hr, ?med changes needed has not f/u with sleep study. also reports death of her kids father and stress/ sadness with upcoming funeral, needs med to help sleep, ambin ineffective  Current Medications (verified): 1)  Celexa 20 Mg Tabs (Citalopram Hydrobromide) .... Take 1 Tablet By Mouth Once A Day 2)  Coreg Cr 20 Mg Xr24h-Cap (Carvedilol Phosphate) .Marland Kitchen.. 1 By Mouth Once Daily 3)  Diovan 320 Mg Tabs (Valsartan) .Marland Kitchen.. 1 By Mouth Once Daily 4)  Klor-Con M20 20 Meq Cr-Tabs  (Potassium Chloride Crys Cr) .Marland Kitchen.. 1 By Mouth Once Daily 5)  Lantus 100 Unit/ml Soln (Insulin Glargine) .... Inject Subcutaneously 80 Units At Bedtime 6)  Metformin Hcl 500 Mg Tb24 (Metformin Hcl) .... Take 2 Tablet By Mouth Twice A Day 7)  Novolog Flexpen 100 Unit/ml  Soln (Insulin Aspart) .... Take 18 Units Subcutaneously Three Times A Day/ac 8)  Torsemide 20 Mg Tabs (Torsemide) .Marland Kitchen.. 1 By Mouth Two Times A Day 9)  Proair Hfa 108 (90 Base) Mcg/act Aers (Albuterol Sulfate) .... 2 Puffs Four Times Per Day As Needed 10)  Symlin 600 Mcg/ml Soln (Pramlintide Acetate) .... Take 10 Units With Each Meals  Allergies (verified): 1)  ! * Altace  Past History:  Past Medical History: morbid obesity Hypertension Anemia-iron deficiency Diabetes mellitus, type II, controlled c-spine disc dz Hyperlipidemia Anxiety Depression Allergic rhinitis  genital herpes hx hx of + PPD    Review of Systems  The patient denies hoarseness, chest pain, syncope, and severe indigestion/heartburn.    Physical Exam  General:  alert, appropriate dress, cooperative to examination, and overweight-appearing.  spouse at side Lungs:  normal respiratory effort, no intercostal retractions or use of accessory muscles; normal breath sounds bilaterally - no crackles and no wheezes.  Heart:  distant tachycardic rate, regular rhythm, no murmur, and no rub. BLE with trace edema. Abdomen:  obese, soft, non-tender, and normal bowel sounds.     Impression & Recommendations:  Problem # 1:  DYSURIA (ICD-788.1)  ua dip unremarkable but classic symptoms  send for ucx and emperic tx for uti Her updated medication list for this problem includes:    Ciprofloxacin Hcl 500 Mg Tabs (Ciprofloxacin hcl) .Marland Kitchen... 1 by mouth two times a day x 7days  Orders: T-Culture, Urine ([id number]) UA Dipstick w/o Micro (manual) (14782)  Encouraged to push clear liquids, get enough rest, and take acetaminophen as needed. To be seen in 10 days  if no improvement, sooner if worse.  Problem # 2:  GRIEF REACTION (ICD-309.0) death of her kids dad unexpected. anxiety a/w same limited number valium given to try aiding sleep, d/w pt / spouse who understand this will not be refilled  Problem # 3:  PALPITATIONS (ICD-785.1)  Her updated medication list for this problem includes:    Coreg Cr 20 Mg Xr24h-cap (Carvedilol phosphate) .Marland Kitchen... 1 by mouth once daily  likely exac by infx (uti) and stress/anxiety Labs reviewed and normal did not keep cards apt as referred 01/2009 Stress echo ordered 11/2008 for evaluation of underlying anatomic cardiac issues not done plan check of echo or stress test consider re-refer to cards for pt peace of mind and asst with eval of same also discussed need to lose weight to reduce cardiac strain and cont with sleep eval as prev scheduled for ?[facility] Continue ongoing treatment with beta blocker,  offered change of antiHTN meds to inc coreg, dec diovan and pt declines f/u 2-4 weeks to rediscuss  Problem # 4:  MORBID OBESITY (ICD-278.01)  Ht: 66.5 (07/17/2009)   Wt: 308.4 (07/17/2009)   BMI: 50.29 (01/29/2009)  Problem # 5:  DIABETES MELLITUS, TYPE II (ICD-250.00)  Her updated medication list for this problem includes:    Diovan 320 Mg Tabs (Valsartan) .Marland Kitchen... 1 by mouth once daily    Lantus 100 Unit/ml Soln (Insulin glargine) ..... Inject subcutaneously 80 units at bedtime    Metformin Hcl 500 Mg Tb24 (Metformin hcl) .Marland Kitchen... Take 2 tablet by mouth twice a day    Novolog Flexpen 100 Unit/ml Soln (Insulin aspart) .Marland Kitchen... Take 18 units subcutaneously three times a day/ac    Symlin 600 Mcg/ml Soln (Pramlintide acetate) .Marland Kitchen... Take 10 units with each meals  Labs Reviewed: Creat: 0.8 (01/29/2009)     Last Eye Exam: diabetic retinopathy (04/17/2009) Reviewed HgBA1c results: 7.6 (01/29/2009)  7.6 (01/13/2007)  Complete Medication List: 1)  Celexa 20 Mg Tabs (Citalopram hydrobromide) .... Take 1 tablet by mouth once a  day 2)  Coreg Cr 20 Mg Xr24h-cap (Carvedilol phosphate) .Marland Kitchen.. 1 by mouth once daily 3)  Diovan 320 Mg Tabs (Valsartan) .Marland Kitchen.. 1 by mouth once daily 4)  Klor-con M20 20 Meq Cr-tabs (Potassium chloride crys cr) .Marland Kitchen.. 1 by mouth once daily 5)  Lantus 100 Unit/ml Soln (Insulin glargine) .... Inject subcutaneously 80 units at bedtime 6)  Metformin Hcl 500 Mg Tb24 (Metformin hcl) .... Take 2 tablet by mouth twice a day 7)  Novolog Flexpen 100 Unit/ml Soln (Insulin aspart) .... Take 18 units subcutaneously three times a day/ac 8)  Torsemide 20 Mg Tabs (Torsemide) .Marland Kitchen.. 1 by mouth two times a day 9)  Proair Hfa 108 (90 Base) Mcg/act Aers (Albuterol sulfate) .... 2 puffs four times per day as needed 10)  Symlin 600 Mcg/ml Soln (Pramlintide acetate) .Marland KitchenMarland KitchenMarland Kitchen  Take 10 units with each meals 11)  Ciprofloxacin Hcl 500 Mg Tabs (Ciprofloxacin hcl) .Marland Kitchen.. 1 by mouth two times a day x 7days 12)  Valium 5 Mg Tabs (Diazepam) .... 1/2-1 tab by mouth at bedtime as needed  Patient Instructions: 1)  it was good to see you today. 2)  will send urine for urine culture to confirm or deny UTI - in meanwhile treat with Cipro as discussed - 3)  also limited supply of Valium to use for sleep - no refills 4)  Please schedule a follow-up appointment in 2-4 weeks to discuss other concerns like heart rate and sleep apnea, call sooner if problems.  Prescriptions: VALIUM 5 MG TABS (DIAZEPAM) 1/2-1 tab by mouth at bedtime as needed  #7 x 0   Entered and Authorized by:   Newt Lukes MD   Signed by:   Newt Lukes MD on 07/17/2009   Method used:   Print then Give to Patient   RxID:   [id number] CIPROFLOXACIN HCL 500 MG TABS (CIPROFLOXACIN HCL) 1 by mouth two times a day x 7days  #14 x 0   Entered and Authorized by:   Newt Lukes MD   Signed by:   Newt Lukes MD on 07/17/2009   Method used:   Print then Give to Patient   RxID:   [id number]   Laboratory Results   Urine Tests    Routine  Urinalysis   Color: colorless Appearance: Clear Glucose: negative   (Normal Range: Negative) Bilirubin: negative   (Normal Range: Negative) Ketone: negative   (Normal Range: Negative) Spec. Gravity: <1.005   (Normal Range: 1.003-1.035) Blood: negative   (Normal Range: Negative) pH: 5.0   (Normal Range: 5.0-8.0) Protein: negative   (Normal Range: Negative) Urobilinogen: 0.2   (Normal Range: 0-1) Nitrite: negative   (Normal Range: Negative) Leukocyte Esterace: negative   (Normal Range: Negative)

## 2010-05-27 NOTE — Assessment & Plan Note (Signed)
Summary: BAD HEADACHE/CD   Vital Signs:  Patient profile:   45 year old female Height:      66.5 inches (168.91 cm) Weight:      310.0 pounds (140.91 kg) BMI:     49.46 O2 Sat:      97 % on Room air Temp:     98.6 degrees F (37.00 degrees C) oral Pulse rate:   94 / minute BP sitting:   132 / 78  (left arm) Cuff size:   large  Vitals Entered By: Orlan Leavens RMA (November 22, 2009 11:24 AM)  O2 Flow:  Room air CC: Headache, Headaches Is Patient Diabetic? Yes Did you bring your meter with you today? No Pain Assessment Patient in pain? yes     Location: head Type: aching Comments Pt states early this am lower part of (R) head was throbbing/aching. Painful to touch   Primary Care Provider:  Newt Lukes MD  CC:  Headache and Headaches.  History of Present Illness:  07/17/09 OV reviewed:: concerned with rapid hr, ?med changes needed has not f/u with sleep study. also reports death of her kids father and stress/ sadness with upcoming funeral, needs med to help sleep, ambin ineffective  Headaches      This is a 45 year old woman who presents with Headaches.  The symptoms began 8-12 hrs ago.  On a scale of 1 to 10, the intensity is described as a 4.  surface of scalp is tender to touch  posterior right area behind ear and occipital region - ?precipitated by steroid injections into scalp for alopecia at crown by derm 4 days ago.  The patient reports nasal congestion and sinus pressure, but denies nausea, vomiting, tearing of eyes, sinus pain, photophobia, and phonophobia.  The headache is described as constant and throbbing.  The location of the pain is unilateral on the right and occipital.  The patient denies the following high-risk features: fever, neck pain/stiffness, vision loss or change, focal weakness, altered mental status, rash, trauma, pain worse with exertion, immunosuppression, concomitant infection, and anticoagulation use.  The headaches are precipitated by change in  weather.  Prior treatment has included a narcotic.    would like f/u with sleep study given apnea problems postop 07/2009 at [facility] (following gyn surg) - prev referred 12/2008 but cx appt  Current Medications (verified): 1)  Celexa 20 Mg Tabs (Citalopram Hydrobromide) .... Take 1 Tablet By Mouth Once A Day 2)  Coreg Cr 20 Mg Xr24h-Cap (Carvedilol Phosphate) .Marland Kitchen.. 1 By Mouth Once Daily 3)  Diovan 320 Mg Tabs (Valsartan) .Marland Kitchen.. 1 By Mouth Once Daily 4)  Klor-Con M20 20 Meq Cr-Tabs (Potassium Chloride Crys Cr) .Marland Kitchen.. 1 By Mouth Once Daily 5)  Lantus 100 Unit/ml Soln (Insulin Glargine) .... Inject Subcutaneously 80 Units At Bedtime 6)  Metformin Hcl 500 Mg Tb24 (Metformin Hcl) .... Take 2 Tablet By Mouth Twice A Day 7)  Novolog Flexpen 100 Unit/ml  Soln (Insulin Aspart) .... Take 18 Units Subcutaneously Three Times A Day/ac 8)  Torsemide 20 Mg Tabs (Torsemide) .Marland Kitchen.. 1 By Mouth Two Times A Day 9)  Proair Hfa 108 (90 Base) Mcg/act Aers (Albuterol Sulfate) .... 2 Puffs Four Times Per Day As Needed 10)  Valium 5 Mg Tabs (Diazepam) .... 1/2-1 Tab By Mouth At Bedtime As Needed 11)  Bayer Breeze 2 Test  Disk (Glucose Blood) .... Check Blood Sugars Two Times A Day  Allergies (verified): 1)  ! * Altace  Past History:  Past Medical History: morbid obesity Hypertension Anemia-iron deficiency Diabetes mellitus, type II, controlled c-spine disc dz Hyperlipidemia Anxiety Depression Allergic rhinitis  genital herpes hx hx of + PPD alopecia areata  Review of Systems  The patient denies fever, vision loss, decreased hearing, syncope, abdominal pain, and depression.    Physical Exam  General:  alert, appropriate dress, cooperative to examination, and overweight-appearing.  spouse at side Head:  Normocephalic and atraumatic without obvious abnormalities. + alopecia at crown (broken hairs) - no scalp redness or pustules, tender to touch over right occipital region Eyes:  vision grossly intact; pupils  equal, round and reactive to light.  conjunctiva and lids normal.    Ears:  normal pinnae bilaterally, without erythema, swelling, or tenderness to palpation. L TM clear and R TM hazy but without effusion, or cerumen impaction. Hearing grossly normal bilaterally  Mouth:  teeth and gums in good repair; mucous membranes moist, without lesions or ulcers. oropharynx clear without exudate, no erythema. +PND Neurologic:  alert & oriented X3 and cranial nerves II-XII symetrically intact.  strength normal in all extremities, sensation intact to light touch, and gait normal. speech fluent without dysarthria or aphasia; follows commands with good comprehension.    Impression & Recommendations:  Problem # 1:  HEADACHE (ICD-784.0)  symptoms today most c/w scalp cellulitis and or early R OM (hazy TM) given tenderness to palp - neuro exam beningn - reassurance provided tx with 7 days Levaquin -  Her updated medication list for this problem includes:    Coreg Cr 20 Mg Xr24h-cap (Carvedilol phosphate) .Marland Kitchen... 1 by mouth once daily  Problem # 2:  SLEEPLESSNESS (ICD-780.52)  High suspicion for underlying obstructive sleep apnea, treating to patient's sleep complaints Re-refer to pulmonary for further evaluation of same and treatment as needed Discussed with patient's reasons to avoid sleep aids at this time also discussed need to recognize obstructive sleep apnea if present to prevent cardiac and pulmonary complications related to obesity  Orders: Sleep Disorder Referral (Sleep Disorder)  Problem # 3:  MORBID OBESITY (ICD-278.01)  Orders: Sleep Disorder Referral (Sleep Disorder)  Ht: 66.5 (07/17/2009)   Wt: 308.4 (07/17/2009)   BMI: 50.29 (01/29/2009)  Ht: 66.5 (11/22/2009)   Wt: 310.0 (11/22/2009)   BMI: 49.46 (11/22/2009)  Complete Medication List: 1)  Celexa 20 Mg Tabs (Citalopram hydrobromide) .... Take 1 tablet by mouth once a day 2)  Coreg Cr 20 Mg Xr24h-cap (Carvedilol phosphate) .Marland Kitchen.. 1 by  mouth once daily 3)  Diovan 320 Mg Tabs (Valsartan) .Marland Kitchen.. 1 by mouth once daily 4)  Klor-con M20 20 Meq Cr-tabs (Potassium chloride crys cr) .Marland Kitchen.. 1 by mouth once daily 5)  Lantus 100 Unit/ml Soln (Insulin glargine) .... Inject subcutaneously 80 units at bedtime 6)  Metformin Hcl 500 Mg Tb24 (Metformin hcl) .... Take 2 tablet by mouth twice a day 7)  Novolog Flexpen 100 Unit/ml Soln (Insulin aspart) .... Take 18 units subcutaneously three times a day/ac 8)  Torsemide 20 Mg Tabs (Torsemide) .Marland Kitchen.. 1 by mouth two times a day 9)  Proair Hfa 108 (90 Base) Mcg/act Aers (Albuterol sulfate) .... 2 puffs four times per day as needed 10)  Valium 5 Mg Tabs (Diazepam) .... 1/2-1 tab by mouth at bedtime as needed 11)  Bayer Breeze 2 Test Disk (Glucose blood) .... Check blood sugars two times a day 12)  Levaquin 500 Mg Tabs (Levofloxacin) .Marland Kitchen.. 1 by mouth once daily  Patient Instructions: 1)  it was good to see you today. 2)  Levaquin - your prescription have been given to you to submit to your pharmacy. Please take as directed. Contact our office if you believe you're having problems with the medication(s).  3)  Get plenty of rest, drink lots of clear liquids, use warm compress to tender area and use Tylenol or Ibuprofen for comfort. Return in 7-10 days if you're not better:sooner if you're feeling worse. 4)  we'll make referral to sleep specialist at [facility] for evaluation of possible apnea. Our office will contact you regarding this appointment once made.  Prescriptions: LEVAQUIN 500 MG TABS (LEVOFLOXACIN) 1 by mouth once daily  #7 x 0   Entered and Authorized by:   Newt Lukes MD   Signed by:   Newt Lukes MD on 11/22/2009   Method used:   Print then Give to Patient   RxID:   [id number]

## 2010-05-27 NOTE — Progress Notes (Signed)
Summary: not better/results  Phone Note Call from Patient Call back at Home Phone [phone]   Summary of Call: Patient left message on triage that she still has pain, but it is not as bad as it was. Patient is requesting culture results. Patient would also like to know if she should come in again since she is not much better. Please advise. Initial call taken by: Lucious Groves,  July 22, 2009 3:42 PM  Follow-up for Phone Call        U cx did not show any particular bacteria - can try different abx  (ok to finish Cipro 1st)- amox x 5 days after cipro done - erx done - but if still with symptoms, should make OV - thanks Follow-up by: Newt Lukes MD,  July 22, 2009 4:37 PM  Additional Follow-up for Phone Call Additional follow up Details #1::        left message on voicemail to call back to office. Additional Follow-up by: Lucious Groves,  July 22, 2009 4:40 PM    Additional Follow-up for Phone Call Additional follow up Details #2::    Patient notified and will her GYN due to cx results. Follow-up by: Lucious Groves,  July 22, 2009 4:48 PM  New/Updated Medications: AMOXICILLIN 500 MG CAPS (AMOXICILLIN) 1 by mouth three times a day x 5 days Prescriptions: AMOXICILLIN 500 MG CAPS (AMOXICILLIN) 1 by mouth three times a day x 5 days  #15 x 0   Entered and Authorized by:   Newt Lukes MD   Signed by:   Newt Lukes MD on 07/22/2009   Method used:   Electronically to        [facility] # [phone]* (retail)       [address]       [place], [place]  [address]       Ph: [phone]       Fax: [phone]   RxID:   [id number]

## 2010-05-27 NOTE — Consult Note (Signed)
Summary: Endocrinology/[facility]  [facility]ocrinology/[facility]   Imported By: Sherian Rein 08/12/2009 12:06:12  _____________________________________________________________________  External Attachment:    Type:   Image     Comment:   External Document

## 2010-05-27 NOTE — Letter (Signed)
[facility]   Imported By: Lester Campbell 08/26/2009 10:45:25  _____________________________________________________________________  External Attachment:    Type:   Image     Comment:   External Document

## 2010-05-27 NOTE — Progress Notes (Signed)
Summary: nos appt  Phone Note Call from Patient   Caller: [email or web address]  Call For: clance Summary of Call: Rsc nos from 8/16 to 9/1 @ 10:15a. Initial call taken by: Darletta Moll,  December 11, 2009 2:18 PM

## 2010-05-27 NOTE — Assessment & Plan Note (Signed)
Summary: CONGESTION/NWS   Vital Signs:  Patient profile:   45 year old female Height:      66.5 inches (168.91 cm) Weight:      314.4 pounds (142.91 kg) O2 Sat:      97 % on Room air Temp:     98.1 degrees F (36.72 degrees C) oral Pulse rate:   91 / minute BP sitting:   120 / 70  (left arm) Cuff size:   regular  Vitals Entered By: Orlan Leavens (May 03, 2009 11:30 AM)  O2 Flow:  Room air CC: sinus & nasal congestion Is Patient Diabetic? Yes Did you bring your meter with you today? No Pain Assessment Patient in pain? no        Primary Care Provider:  Newt Lukes MD  CC:  sinus & nasal congestion.  History of Present Illness: here today with complaint of  sinus and nasal congestion. onset of symptoms was 8 days ago. course has been gradual onset and now occurs in progressive worsening pattern (spreading into chest from head). problem precipitated by +sick contacts and weather change (snow) symptom characterized as pressure in head located behind and below her eyes and nose problem associated with nasal discharge and postnasal drip (yellow, thick, occ blood tinge)  but not associated with fever, vision or hearing changes. symptoms improved by nothing - tried OTC antihistamines and decongestants. symptoms worsened with nighttime (cool air and lying flat). + prior hx of same symptoms  -generally a/w allergy flares in winter.   Current Medications (verified): 1)  Celexa 20 Mg Tabs (Citalopram Hydrobromide) .... Take 1 Tablet By Mouth Once A Day 2)  Coreg Cr 20 Mg Xr24h-Cap (Carvedilol Phosphate) .Marland Kitchen.. 1 By Mouth Once Daily 3)  Diovan 320 Mg Tabs (Valsartan) .Marland Kitchen.. 1 By Mouth Once Daily 4)  Klor-Con M20 20 Meq Cr-Tabs (Potassium Chloride Crys Cr) .Marland Kitchen.. 1 By Mouth Once Daily 5)  Lantus 100 Unit/ml Soln (Insulin Glargine) .... Inject Subcutaneously 80 Units At Bedtime 6)  Metformin Hcl 500 Mg Tb24 (Metformin Hcl) .... Take 2 Tablet By Mouth Twice A Day 7)  Novolog Flexpen  100 Unit/ml  Soln (Insulin Aspart) .... Take 18 Units Subcutaneously Three Times A Day/ac 8)  Torsemide 20 Mg Tabs (Torsemide) .Marland Kitchen.. 1 By Mouth Two Times A Day 9)  Proair Hfa 108 (90 Base) Mcg/act Aers (Albuterol Sulfate) .... 2 Puffs Four Times Per Day As Needed 10)  Symlin 600 Mcg/ml Soln (Pramlintide Acetate) .... Take 10 Units With Each Meals 11)  Triamcinolone Acetonide 0.5 % Crea (Triamcinolone Acetonide) .... Apply To Affected Area Four Times A Day  Allergies (verified): 1)  ! * Altace  Past History:  Past Medical History: Last updated: 01/29/2009 morbid obesity Hypertension Anemia-iron deficiency Diabetes mellitus, type II, controlled c-spine disc dz Hyperlipidemia Anxiety Depression Allergic rhinitis genital herpes hx hx of + PPD  Review of Systems  The patient denies fever, vision loss, decreased hearing, hoarseness, chest pain, syncope, and peripheral edema.    Physical Exam  General:  alert, appropriate dress, cooperative to examination, and overweight-appearing.   Eyes:  vision grossly intact; pupils equal, round and reactive to light.  conjunctiva and lids normal.    Nose:  no external deformity, no external erythema, mucosal erythema, L maxillary sinus tenderness, and R maxillary sinus tenderness.   Mouth:  teeth and gums in good repair; mucous membranes moist, without lesions or ulcers. oropharynx clear without exudate, mod erythema. +PND Lungs:  normal respiratory effort, no  intercostal retractions or use of accessory muscles; normal breath sounds bilaterally - no crackles and no wheezes.    Heart:  normal rate, regular rhythm, no murmur, and no rub. BLE with trace edema.   Impression & Recommendations:  Problem # 1:  ACUTE MAXILLARY SINUSITIS (ICD-461.0)  Her updated medication list for this problem includes:    Augmentin 875-125 Mg Tabs (Amoxicillin-pot clavulanate) .Marland Kitchen... 1 by mouth two times a day x 7days  Instructed on treatment. Call if symptoms  persist or worsen.   Orders: Prescription Created Electronically [phone])  Complete Medication List: 1)  Celexa 20 Mg Tabs (Citalopram hydrobromide) .... Take 1 tablet by mouth once a day 2)  Coreg Cr 20 Mg Xr24h-cap (Carvedilol phosphate) .Marland Kitchen.. 1 by mouth once daily 3)  Diovan 320 Mg Tabs (Valsartan) .Marland Kitchen.. 1 by mouth once daily 4)  Klor-con M20 20 Meq Cr-tabs (Potassium chloride crys cr) .Marland Kitchen.. 1 by mouth once daily 5)  Lantus 100 Unit/ml Soln (Insulin glargine) .... Inject subcutaneously 80 units at bedtime 6)  Metformin Hcl 500 Mg Tb24 (Metformin hcl) .... Take 2 tablet by mouth twice a day 7)  Novolog Flexpen 100 Unit/ml Soln (Insulin aspart) .... Take 18 units subcutaneously three times a day/ac 8)  Torsemide 20 Mg Tabs (Torsemide) .Marland Kitchen.. 1 by mouth two times a day 9)  Proair Hfa 108 (90 Base) Mcg/act Aers (Albuterol sulfate) .... 2 puffs four times per day as needed 10)  Symlin 600 Mcg/ml Soln (Pramlintide acetate) .... Take 10 units with each meals 11)  Triamcinolone Acetonide 0.5 % Crea (Triamcinolone acetonide) .... Apply to affected area four times a day 12)  Augmentin 875-125 Mg Tabs (Amoxicillin-pot clavulanate) .Marland Kitchen.. 1 by mouth two times a day x 7days  Patient Instructions: 1)  it was good to see you today.  2)  antibiotics - augmentin - your prescription has been electronically submitted to your pharmacy. Please take as directed. Contact our office if you believe you're having problems with the medication(s).  3)  continue Mucinex, nasal saline and try tylenol as needed for ache and fatigue 4)  Acute sinusitis symptoms: Call if no improvement in 5-7 days, sooner if increasing pain, fever, or new symptoms. Prescriptions: AUGMENTIN 875-125 MG TABS (AMOXICILLIN-POT CLAVULANATE) 1 by mouth two times a day x 7days  #14 x 0   Entered and Authorized by:   Newt Lukes MD   Signed by:   Newt Lukes MD on 05/03/2009   Method used:   Electronically to        [facility] # [phone]* (retail)       [address]       [place], [place]  [address]       Ph: [phone]       Fax: [phone]   RxID:   [id number]

## 2010-05-27 NOTE — Progress Notes (Signed)
Summary: Referral  Phone Note Call from Patient   Caller: Patient [phone] Summary of Call: pt called to inform MD of Endo Dr what she wants to be referred to. Pt is requesting Endo clinic at [facility] [phone] Initial call taken by: Margaret Pyle, CMA,  May 20, 2009 1:51 PM  Follow-up for Phone Call        will make referral as requested Follow-up by: Newt Lukes MD,  May 20, 2009 1:57 PM

## 2010-05-27 NOTE — Letter (Signed)
Summary: [facility]   Imported By: Sherian Rein 09/03/2009 10:51:33  _____________________________________________________________________  External Attachment:    Type:   Image     Comment:   External Document

## 2010-05-29 NOTE — Progress Notes (Signed)
Summary: Rx refill req  Phone Note Refill Request Message from:  Patient on May 16, 2010 3:57 PM  Refills Requested: Medication #1:  COREG CR 20 MG XR24H-CAP 1 by mouth once daily   Dosage confirmed as above?Dosage Confirmed   Supply Requested: 1 month pt has appt scheduled fo 05/19/2010   Method Requested: Electronic Initial call taken by: Margaret Pyle, CMA,  May 16, 2010 3:58 PM    Prescriptions: COREG CR 20 MG XR24H-CAP (CARVEDILOL PHOSPHATE) 1 by mouth once daily  #30 Capsule x 0   Entered by:   Margaret Pyle, CMA   Authorized by:   Newt Lukes MD   Signed by:   Margaret Pyle, CMA on 05/16/2010   Method used:   Electronically to        [facility] # [phone]* (retail)       [address]       [place], [place]  [address]       Ph: [phone]       Fax: [phone]   RxID:   [id number]

## 2010-05-29 NOTE — Letter (Addendum)
Summary: Primary Care Consult Scheduled Letter  [facility]  [address] [place], [place] [address]   Phone: [phone]  Fax: [phone]      05/23/2010 MRN: [id number]  Heather Myers [address] [place], [place]  [address]    Dear Heather Myers,      We have scheduled an appointment for you.  At the recommendation of Dr.Lescber, we have scheduled you a consult with  DR. Justus Memory on Feb 29,2012 at 4:00pm.  Their address is [address] The office phone number is [phone].  If this appointment day and time is not convenient for you, please feel free to call the office of the doctor you are being referred to at the number listed above and reschedule the appointment.  Also we scheduled you an appt with Dr.Clance [facility] I[facility] floor [address]  2:15pm If you can not make that appt please give them a call @ [phone].Please give a 24hr notice to cancel /reschedule to avoid a $50.00 Fee. Also bring insurance and any co-pay due at time of visit    It is important for you to keep your scheduled appointments. We are here to make sure you are given good patient care. If you have questions or you have made changes to your appointment, please notify us at  [phone]       , ask for   Heather Myers           .    Thank you,  Patient Care Coordinator [facility]

## 2010-05-29 NOTE — Assessment & Plan Note (Signed)
Summary: FU ON BP /NWS   Vital Signs:  Patient profile:   45 year old female Height:      66.5 inches (168.91 cm) Weight:      310.4 pounds (141.09 kg) O2 Sat:      94 % on Room air Temp:     98.0 degrees F (36.67 degrees C) oral Pulse rate:   109 / minute BP sitting:   118 / 60  (left arm) Cuff size:   large  Vitals Entered By: Orlan Leavens RMA (May 19, 2010 3:07 PM)  O2 Flow:  Room air CC: Follow-up on BP Is Patient Diabetic? Yes Did you bring your meter with you today? No Pain Assessment Patient in pain? no        Primary Care Provider:  Newt Lukes MD  CC:  Follow-up on BP.  History of Present Illness: here for f/u  HTN - complicated by palpitations, chronically very concerned with rapid hr has not f/u with sleep study. missed card eval 01/2009 due to "stress"  ?OSA -cx sleep eval 12/10/09 for ?sleep study given apnea problems postop 07/2009 at [facility] (following gyn surg) - prev referred 12/2008 but cx appt - now with daytime fatigue and trouble falling aslee- ready for sleep eval now  DM2 - follows with endo at [facility] for same - reports compliance with ongoing medical treatment and no changes in medication dose or frequency. denies adverse side effects related to current therapy.   depression - on celexa for years - would like to change medication due poor sleep -   Clinical Review Panels:  Lipid Management   Cholesterol:  176 (01/29/2009)   LDL (bad choesterol):  120 (01/29/2009)   HDL (good cholesterol):  35.10 (01/29/2009)  Diabetes Management   HgBA1C:  7.6 (01/29/2009)   Creatinine:  0.8 (01/29/2009)   Last Dilated Eye Exam:  diabetic retinopathy (04/17/2009)   Last Flu Vaccine:  Fluvax 3+ (01/29/2009)  CBC   WBC:  9.3 (12/21/2008)   RBC:  3.99 (12/21/2008)   Hgb:  12.5 (12/21/2008)   Hct:  36.4 (12/21/2008)   Platelets:  486.0 (12/21/2008)   MCV  91.2 (12/21/2008)   MCHC  34.3 (12/21/2008)   RDW  14.1 (12/21/2008)   PMN:  63.5  (12/21/2008)   Lymphs:  26.6 (12/21/2008)   Monos:  5.6 (12/21/2008)   Eosinophils:  3.5 (12/21/2008)   Basophil:  0.8 (12/21/2008)  Complete Metabolic Panel   Glucose:  220 (01/29/2009)   Sodium:  137 (01/29/2009)   Potassium:  3.3 (01/29/2009)   Chloride:  101 (01/29/2009)   CO2:  28 (01/29/2009)   BUN:  12 (01/29/2009)   Creatinine:  0.8 (01/29/2009)   Albumin:  2.7 (01/13/2007)   Total Protein:  6.7 (01/13/2007)   Calcium:  8.8 (01/29/2009)   Total Bili:  0.4 (01/13/2007)   Alk Phos:  72 (01/13/2007)   SGPT (ALT):  12 (01/13/2007)   SGOT (AST):  16 (01/13/2007)   Current Medications (verified): 1)  Celexa 20 Mg Tabs (Citalopram Hydrobromide) .... Take 1 Tablet By Mouth Once A Day 2)  Coreg Cr 20 Mg Xr24h-Cap (Carvedilol Phosphate) .Marland Kitchen.. 1 By Mouth Once Daily 3)  Diovan 320 Mg Tabs (Valsartan) .Marland Kitchen.. 1 By Mouth Once Daily 4)  Klor-Con M20 20 Meq Cr-Tabs (Potassium Chloride Crys Cr) .Marland Kitchen.. 1 By Mouth Once Daily. Yearly Physical With Labs Due in Oct 5)  Lantus 100 Unit/ml Soln (Insulin Glargine) .... Inject Subcutaneously 80 Units At Bedtime 6)  Metformin  Hcl 500 Mg Tb24 (Metformin Hcl) .... Take 2 Tablet By Mouth Twice A Day 7)  Novolog Flexpen 100 Unit/ml  Soln (Insulin Aspart) .... Take 18 Units Subcutaneously Three Times A Day/ac 8)  Torsemide 20 Mg Tabs (Torsemide) .Marland Kitchen.. 1 By Mouth Two Times A Day 9)  Proair Hfa 108 (90 Base) Mcg/act Aers (Albuterol Sulfate) .... 2 Puffs Four Times Per Day As Needed 10)  Valium 5 Mg Tabs (Diazepam) .... 1/2-1 Tab By Mouth At Bedtime As Needed 11)  Bayer Breeze 2 Test  Disk (Glucose Blood) .... Check Blood Sugars Two Times A Day 12)  Flonase 50 Mcg/act Susp (Fluticasone Propionate) .Marland Kitchen.. 1 Spray Each Nostril  Every Morning  Allergies (verified): 1)  ! * Altace  Past History:  Past Medical History: morbid obesity Hypertension Anemia-iron deficiency Diabetes mellitus, type II, uncontrolled c-spine disc dz Hyperlipidemia   Anxiety Depression Allergic rhinitis   genital herpes hx hx of + PPD alopecia areata  MD roster: endo - [facility] - burns  Review of Systems  The patient denies anorexia, weight loss, chest pain, syncope, headaches, and abdominal pain.    Physical Exam  General:  alert, appropriate dress, cooperative to examination, and overweight-appearing.   Lungs:  normal respiratory effort, no intercostal retractions or use of accessory muscles; normal breath sounds bilaterally - no crackles and no wheezes.    Heart:  distant tachycardic rate, regular rhythm, no murmur, and no rub. BLE with trace edema. Psych:  Oriented X3, memory intact for recent and remote, normally interactive, good eye contact, not anxious appearing, min depressed appearing, and not agitated.      Impression & Recommendations:  Problem # 1:  DEPRESSION (ICD-311)  on celexa since 2006 - change to alt med now -  wellbutrin - erx done after discussion of risk/poss benefits of same -  i declined to rx sleep med until further sleep eval complete The following medications were removed from the medication list:    Valium 5 Mg Tabs (Diazepam) .Marland Kitchen... 1/2-1 tab by mouth at bedtime as needed Her updated medication list for this problem includes:    Wellbutrin Xl 150 Mg Xr24h-tab (Bupropion hcl) .Marland Kitchen... 1 by mouth once daily  Orders: Prescription Created Electronically [phone])  Problem # 2:  SLEEPLESSNESS (ICD-780.52)  High suspicion for underlying obstructive sleep apnea, contrib to patient's sleep complaints Re-refer to pulmonary for further evaluation of same and treatment as needed Discussed with patient's reasons to avoid sleep aids at this time also discussed need to recognize obstructive sleep apnea if present to prevent cardiac and pulmonary complications related to obesity  Orders: Pulmonary Referral (Pulmonary)  Problem # 3:  PALPITATIONS (ICD-785.1)  Her updated medication list for this problem includes:    Coreg Cr  20 Mg Xr24h-cap (Carvedilol phosphate) .Marland Kitchen... 1 by mouth once daily  chronic symptoms did not keep cards apt as referred 01/2009 Stress echo 11/2008 for evaluation of underlying anatomic cardiac issues not done but cardiolite normal re-refer to cards for pt peace of mind -  also discussed need to lose weight to reduce cardiac strain and cont with sleep eval as prev scheduled for ?OSA Continue ongoing treatment with beta blocker, offered change of antiHTN meds to inc coreg, dec diovan and pt declines  Orders: Cardiology Referral (Cardiology)  Problem # 4:  MORBID OBESITY (ICD-278.01)  Orders: Sleep Disorder Referral (Sleep Disorder)  Ht: 66.5 (07/17/2009)   Wt: 308.4 (07/17/2009)   BMI: 50.29 (01/29/2009)  Ht: 66.5 (11/22/2009)   Wt: 310.0 (  11/22/2009)   BMI: 49.46 (11/22/2009)  Ht: 66.5 (05/19/2010)   Wt: 310.4 (05/19/2010)   BMI: 49.46 (11/22/2009)  Complete Medication List: 1)  Wellbutrin Xl 150 Mg Xr24h-tab (Bupropion hcl) .Marland Kitchen.. 1 by mouth once daily 2)  Coreg Cr 20 Mg Xr24h-cap (Carvedilol phosphate) .Marland Kitchen.. 1 by mouth once daily 3)  Diovan 320 Mg Tabs (Valsartan) .Marland Kitchen.. 1 by mouth once daily 4)  Klor-con M20 20 Meq Cr-tabs (Potassium chloride crys cr) .Marland Kitchen.. 1 by mouth once daily. yearly physical with labs due in oct 5)  Lantus 100 Unit/ml Soln (Insulin glargine) .... Inject subcutaneously 80 units at bedtime 6)  Metformin Hcl 500 Mg Tb24 (Metformin hcl) .... Take 2 tablet by mouth twice a day 7)  Novolog Flexpen 100 Unit/ml Soln (Insulin aspart) .... Take 18 units subcutaneously three times a day/ac 8)  Torsemide 20 Mg Tabs (Torsemide) .Marland Kitchen.. 1 by mouth two times a day 9)  Proair Hfa 108 (90 Base) Mcg/act Aers (Albuterol sulfate) .... 2 puffs four times per day as needed 10)  Bayer Breeze 2 Test Disk (Glucose blood) .... Check blood sugars two times a day 11)  Flonase 50 Mcg/act Susp (Fluticasone propionate) .Marland Kitchen.. 1 spray each nostril  every morning  Patient Instructions: 1)  it was  good to see you today. 2)  stop celexa and start wellbutrin for mood - 3)  refills on other medications done as requested 4)  your prescriptions have been electronically submitted to your pharmacy. Please take as directed. Contact our office if you believe you're having problems with the medication(s).  5)  we'll make referral to  sleep specialist for possible sleep apnea and other sleep problems and to cardiology for opinion about your heartrate. Our office will contact you regarding this appointment once made.  6)  continue to follow at [facility] for your diabetes 7)  Please schedule a follow-up appointment in 3-4 months, call sooner if problems.  Prescriptions: TORSEMIDE 20 MG TABS (TORSEMIDE) 1 by mouth two times a day  #60 x 6   Entered and Authorized by:   Newt Lukes MD   Signed by:   Newt Lukes MD on 05/19/2010   Method used:   Electronically to        [facility] # [phone]* (retail)       [address]       [place], [place]  [address]       Ph: [phone]       Fax: [phone]   RxID:   [id number] KLOR-CON M20 20 MEQ CR-TABS (POTASSIUM CHLORIDE CRYS CR) 1 by mouth once daily. Yearly physical with labs due in Oct  #30 x 6   Entered and Authorized by:   Newt Lukes MD   Signed by:   Newt Lukes MD on 05/19/2010   Method used:   Electronically to        [facility] # [phone]* (retail)       [address]       [place], [place]  [address]       Ph: [phone]       Fax: [phone]   RxID:   [id number] DIOVAN 320 MG TABS (VALSARTAN) 1 by mouth once daily  #30 x 6   Entered and Authorized by:   Newt Lukes MD   Signed by:   Newt Lukes MD on 05/19/2010   Method used:   Electronically to        [facility] # [phone]* (retail)       [address]       [place], [place]  [address]       Ph: [phone]       Fax: [phone]   RxID:   [id number] COREG CR 20 MG XR24H-CAP (CARVEDILOL PHOSPHATE) 1 by mouth once  daily  #30 x 6   Entered and Authorized by:   Newt Lukes MD   Signed by:   Newt Lukes MD on 05/19/2010   Method used:   Electronically to        [facility] # [phone]* (retail)       [address]       [place], [place]  [address]       Ph: [phone]       Fax: [phone]   RxID:   [id number] WELLBUTRIN XL 150 MG XR24H-TAB (BUPROPION HCL) 1 by mouth once daily  #30 x 6   Entered and Authorized by:   Newt Lukes MD   Signed by:   Newt Lukes MD on 05/19/2010   Method used:   Electronically to        [facility] # [phone]* (retail)       [address]       [place], [place]  [address]       Ph: [phone]       Fax: [phone]   RxID:   [id number]    Orders Added: 1)  Est. Patient Level IV [94854] 2)  Prescription Created Electronically [[id number]] 3)  Pulmonary Referral [Pulmonary] 4)  Cardiology Referral [Cardiology]

## 2010-06-10 ENCOUNTER — Institutional Professional Consult (permissible substitution): Payer: Self-pay | Admitting: Pulmonary Disease

## 2010-06-11 ENCOUNTER — Institutional Professional Consult (permissible substitution) (INDEPENDENT_AMBULATORY_CARE_PROVIDER_SITE_OTHER): Payer: BC Managed Care – PPO | Admitting: Internal Medicine

## 2010-06-11 ENCOUNTER — Other Ambulatory Visit: Payer: Self-pay | Admitting: Internal Medicine

## 2010-06-11 ENCOUNTER — Telehealth: Payer: Self-pay | Admitting: Pulmonary Disease

## 2010-06-11 ENCOUNTER — Telehealth: Payer: Self-pay | Admitting: Internal Medicine

## 2010-06-11 ENCOUNTER — Encounter: Payer: Self-pay | Admitting: Internal Medicine

## 2010-06-11 DIAGNOSIS — I479 Paroxysmal tachycardia, unspecified: Secondary | ICD-10-CM

## 2010-06-11 DIAGNOSIS — R0600 Dyspnea, unspecified: Secondary | ICD-10-CM | POA: Insufficient documentation

## 2010-06-11 DIAGNOSIS — I10 Essential (primary) hypertension: Secondary | ICD-10-CM

## 2010-06-11 DIAGNOSIS — R0989 Other specified symptoms and signs involving the circulatory and respiratory systems: Secondary | ICD-10-CM

## 2010-06-11 DIAGNOSIS — R000 Tachycardia, unspecified: Secondary | ICD-10-CM

## 2010-06-12 ENCOUNTER — Ambulatory Visit (INDEPENDENT_AMBULATORY_CARE_PROVIDER_SITE_OTHER)
Admission: RE | Admit: 2010-06-12 | Discharge: 2010-06-12 | Disposition: A | Discharge status: Home / Self Care | Payer: BC Managed Care – PPO | Source: Ambulatory Visit | Attending: Internal Medicine | Admitting: Internal Medicine

## 2010-06-12 DIAGNOSIS — R079 Chest pain, unspecified: Secondary | ICD-10-CM

## 2010-06-12 DIAGNOSIS — R000 Tachycardia, unspecified: Secondary | ICD-10-CM

## 2010-06-12 HISTORY — DX: Essential (primary) hypertension: I10

## 2010-06-12 LAB — CONVERTED CEMR LAB
BUN: 9 mg/dL (ref 6–23)
CO2: 25 meq/L (ref 19–32)
Calcium: 9.1 mg/dL (ref 8.4–10.5)
Chloride: 100 meq/L (ref 96–112)
Creatinine, Ser: 0.65 mg/dL (ref 0.40–1.20)
Glucose, Bld: 280 mg/dL — ABNORMAL HIGH (ref 70–99)
Potassium: 4.1 meq/L (ref 3.5–5.3)

## 2010-06-12 MED ORDER — IOHEXOL 350 MG/ML SOLN
100.0000 mL | Freq: Once | INTRAVENOUS | Status: AC | PRN
  Administered 2010-06-12: 80 mL via INTRAVENOUS

## 2010-06-12 MED ORDER — IOHEXOL 300 MG/ML  SOLN: 80.0000 mL | Freq: Once | INTRAMUSCULAR | Status: DC | PRN

## 2010-06-16 ENCOUNTER — Other Ambulatory Visit: Payer: BC Managed Care – PPO

## 2010-06-18 NOTE — Progress Notes (Signed)
Summary: patient wants a med change  Phone Note Call from Patient Call back at Home Phone [phone]   Caller: Patient Call For: Newt Lukes MD Summary of Call: Patient called stating that in mid January the doctor changed her meds from Celexa to Wellbutrin. Patient states she is having side effects from the Wellbutrin, such as, anxiety and jittery feeling. Patient requests to discontinue the Wellbutrin and requests a new rx for Celexa be sent to [facility]. Please Advise. Initial call taken by: Daphane Shepherd,  June 11, 2010 11:02 AM  Follow-up for Phone Call        ok - stop wellbutrin and resume celexa 20mg  once daily as before - new erx celexa done Follow-up by: Newt Lukes MD,  June 11, 2010 11:38 AM  Additional Follow-up for Phone Call Additional follow up Details #1::        Notified pt with md response. Additional Follow-up by: Orlan Leavens RMA,  June 11, 2010 11:59 AM    New/Updated Medications: CELEXA 20 MG TABS (CITALOPRAM HYDROBROMIDE) 1 by mouth once daily Prescriptions: CELEXA 20 MG TABS (CITALOPRAM HYDROBROMIDE) 1 by mouth once daily  #30 x 6   Entered and Authorized by:   Newt Lukes MD   Signed by:   Newt Lukes MD on 06/11/2010   Method used:   Electronically to        [facility] # [phone]* (retail)       [address]       [place], [place]  [address]       Ph: [phone]       Fax: [phone]   RxID:   [id number]

## 2010-06-18 NOTE — Assessment & Plan Note (Signed)
Summary: tachycardia/palpitations  Medications Added METOPROLOL SUCCINATE 50 MG XR24H-TAB (METOPROLOL SUCCINATE) Take one tablet by mouth daily NOVOLOG FLEXPEN 100 UNIT/ML  SOLN (INSULIN ASPART) take 24 units subcutaneously three times a day/ac      Allergies Added:   Primary Provider:  Newt Lukes MD  CC:  fast heart rate. New pt with complaints of chest pain and fast heart rate.  History of Present Illness: Mrs. Heather Myers is seen at the request of Dr. Felicity Coyer because of tachycardia.  She is a morbidly obese diabetic hypertensive woman who has a long standing history of rapid heart rates. Over the last 3 or 4 years she's had multiple recordings over 100 but typically in the 110-15 range. That is quite distinct was happened over the last 72-96 hours. In the context of nausea and some vomiting, no fevers, she's had increasing palpitations associated with heart rates as fast as 150. She is on it she is sleeping on 3 pillows instead of one-2 pillows and she's been more aware of her breathing and she describes this not as discomfort or pain as much as simply an awareness.  She does not take birth control pills. She has not had prior clots. She has taken recent car trips at the 3-1/2 hours in length. She has some peripheral edema; but this has not changed significantly.  She has noted some chest discomfort more on the right side of her chest aggravated by exertion. She is also noted orthostatic lightheadedness over the same 3-5 day period  She describes her anxiety about her condition as new dating back to the death of her mother, this has been a watershed  event in herlife;  she has been on antidepressants for this but has not pursued counseling.  She has considered weight loss surgery    Current Medications (verified): 1)  Celexa 20 Mg Tabs (Citalopram Hydrobromide) .Marland Kitchen.. 1 By Mouth Once Daily 2)  Coreg Cr 20 Mg Xr24h-Cap (Carvedilol Phosphate) .Marland Kitchen.. 1 By Mouth Once Daily 3)  Diovan  320 Mg Tabs (Valsartan) .Marland Kitchen.. 1 By Mouth Once Daily 4)  Klor-Con M20 20 Meq Cr-Tabs (Potassium Chloride Crys Cr) .Marland Kitchen.. 1 By Mouth Once Daily. Yearly Physical With Labs Due in Oct 5)  Lantus 100 Unit/ml Soln (Insulin Glargine) .... Inject Subcutaneously 80 Units At Bedtime 6)  Metformin Hcl 500 Mg Tb24 (Metformin Hcl) .... Take 2 Tablet By Mouth Twice A Day 7)  Novolog Flexpen 100 Unit/ml  Soln (Insulin Aspart) .... Take 24 Units Subcutaneously Three Times A Day/ac 8)  Torsemide 20 Mg Tabs (Torsemide) .Marland Kitchen.. 1 By Mouth Two Times A Day 9)  Proair Hfa 108 (90 Base) Mcg/act Aers (Albuterol Sulfate) .... 2 Puffs Four Times Per Day As Needed 10)  Bayer Breeze 2 Test  Disk (Glucose Blood) .... Check Blood Sugars Two Times A Day  Allergies (verified): 1)  ! * Altace  Past History:  Past Medical History: Last updated: 05/19/2010 morbid obesity Hypertension Anemia-iron deficiency Diabetes mellitus, type II, uncontrolled c-spine disc dz Hyperlipidemia  Anxiety Depression Allergic rhinitis   genital herpes hx hx of + PPD alopecia areata  MD roster: endo - [facility] - burns  Past Surgical History: Last updated: 03/22/2007 Cholecystectomy c-section x 2  Family History: Last updated: 01/03/2009 Family History of CAD Female 1st degree relative mom died 74 - DM, CKD, retinopathy, AKA strong FH DM - maternal side  Social History: Last updated: 04/19/2008 Never Smoked Alcohol use-no Married Drug use-no Regular exercise-no  Review of Systems  full review of systems was negative apart from a history of present illness and past medical history.   Vital Signs:  Patient profile:   45 year old female Height:      66.5 inches Weight:      309 pounds BMI:     49.30 Pulse rate:   113 / minute BP sitting:   131 / 83  (left arm) Cuff size:   large  Vitals Entered By: Judithe Modest CMA (June 11, 2010 3:29 PM)  Physical Exam  General:  Well developed, well nourished, morbidly  obese middle-aged African American woman appearing her stated age no acute distress. Head:  normal HEENT Neck:  supple without discernible neck vein distention carotids are brisk and without bruits no masses or thyromegaly or lymphadenopathy was appreciated Chest Wall:  without kyphosis or CVA tenderness Lungs:  clear to auscultation Heart:  rapid but regular rate and rhythm Abdomen:  soft protuberant no obvious tenderness bowel sounds are present Msk:  Back normal, normal gait. Muscle strength and tone normal. Pulses:  pulses normal in all 4 extremities Extremities:  No clubbing or cyanosis.trace edema Neurologic:  Alert and oriented x 3.grossly normal motor and sensory function Skin:  warm and dry Psych:  engaging affect   EKG  Procedure date:  06/11/2010  Findings:      sinus rhythm at 113 Intervals 0.04/2005/0.35 Axis is 18 poor R wave progression  Impression & Recommendations:  Problem # 1:  UNSPECIFIED  TACHYCARDIA-PROBABLY SINUS (ICD-427.2) Mrs. Heather Myers has a tachycardia. Apparently she as stated 140 beats per minute the other day. Echocardiographic late today is similar to what she had in 2010 which appears by P wave vector to likely be sinus. The much faster heart rates may represent a different mechanism; however, given the context of typical symptoms i.e. orthostatic lightheadedness, nausea and vomiting, and shortness of breath I wonder whether it's not secondary to whatever the underlying process is.  Secondary tachycardias need to be excluded.  she has had iron deficiency anemia in the past although repeat blood work she says has not been done in the last year or so.  we'll measure her TSH.  Other potential causes of secondary tachycardia are suggested by the abrupt onset of her symptoms accompanied shortness of breath and her recent travel. Pulmonary as embolism needs to be excluded as well; she has some evidence of orthostatic intolerance which gives rise to the  questions noted above.  She also has significant shortness of breath with dyspnea on exertion and some atypical chest pains. She has multiple cardiac risk factors. Echocardiography for diastolic dysfunction and to assess valvularr disease is appropriate;   Myoview is also indicated to exclude coronary artery disease which may be manifesting also as shortness of breath.  given the long-standing nature of her tachycardia and hypertension we'll also check her urine for metanephrines. she is scheduled to see pulmonary for sleep study evaluation; will arrange a outpatient sleep study in anticipation of that visit  She has been taking carvedilol once a day. We'll change her to metoprolol as it is a more powerful beta blocker.  I note now that she is taking long-acting carvedilol. Her updated medication list for this problem includes:    Metoprolol Succinate 50 Mg Xr24h-tab (Metoprolol succinate) .Marland Kitchen... Take one tablet by mouth daily  Problem # 2:  GRIEF REACTION (ICD-309.0) it is a significant component of this ongoing as relates to the death of her mother. I have encouraged her to seek reconciliation  of this  Problem # 3:  MORBID OBESITY (ICD-278.01) I have encouraged her to consider further obesity reduction surgery  Problem # 4:  HYPERTENSION (ICD-401.9) She is a remote history of hypokalemia. in the context of hypertension hyperaldosteronism needs to be excluded. Her updated medication list for this problem includes:    Metoprolol Succinate 50 Mg Xr24h-tab (Metoprolol succinate) .Marland Kitchen... Take one tablet by mouth daily    Diovan 320 Mg Tabs (Valsartan) .Marland Kitchen... 1 by mouth once daily    Torsemide 20 Mg Tabs (Torsemide) .Marland Kitchen... 1 by mouth two times a day  Orders: T-Basic Metabolic Panel [phone]) Misc. Referral (Misc. Ref) Echocardiogram (Echo) Nuclear Stress Test (Nuc Stress Test) CT Scan  (CT Scan)  Other Orders: EKG w/ Interpretation (93000)  Patient Instructions: 1)  Non-Cardiac CT  Angiography (CTA), is a special type of CT scan that uses a computer to produce multi-dimensional views of major blood vessels throughout the body. In CT angiography, a contrast material is injected through an IV to help visualize the blood vessels. You are scheduled for this test on 06/12/10 at 10:30, please arrive in our office at 10:15 for check in. Nothing to eat or drink 2 hours prior to test.  DO NOT TAKE METFORMIN TONIGHT, TOMORROW MORNING OR 48 HOURS AFTER TEST.  Drink plenty of water before and after test.  2)  Your physician recommends that you have lab work: BMP, urine metanephrines 3)  Your physician has requested that you have an echocardiogram.  Echocardiography is a painless test that uses sound waves to create images of your heart. It provides your doctor with information about the size and shape of your heart and how well your heart's chambers and valves are working.  This procedure takes approximately one hour. There are no restrictions for this procedure. 4)  Your physician has requested that you have a Lexiscan.  For further information please visit [email or web address]  Please follow instruction sheet, as given. 5)  Your physician has recommended you make the following change in your medication: STOP Carvedilol (COREG), START Metoprolol Succinate 50mg  once a day 6)  Obtain Outpatient nightwatch study.  Prescriptions: METOPROLOL SUCCINATE 50 MG XR24H-TAB (METOPROLOL SUCCINATE) Take one tablet by mouth daily  #30 x 6   Entered by:   Julieta Gutting, RN, BSN   Authorized by:   Nathen May, MD, [facility]   Signed by:   Julieta Gutting, RN, BSN on 06/11/2010   Method used:   Electronically to        [facility] # [phone]* (retail)       [address]       [place], [place]  [address]       Ph: [phone]       Fax: [phone]   RxID:   [id number]

## 2010-06-18 NOTE — Progress Notes (Signed)
Summary: chest pains,with high pulse rate   Phone Note Call from Patient Call back at Home Phone [phone]   Caller: Patient Summary of Call: Pt pusle rate is high and pt is chest pain Initial call taken by: Judie Grieve,  June 11, 2010 10:32 AM  Follow-up for Phone Call        I spoke with the pt and she sees Dr Felicity Coyer for Primary Care.  Dr Felicity Coyer has referred the pt to EP for tachycardia and palpitations but the pt does not have an appt until the 29th.  The pt said she is feeling bad.  The pt's pulse is normally 110-118 the past couple of months.  Over the past 2 days the pt's pulse has been 130-148.  The pt also has an ache on the right side of her chest that is tender to touch. The pt denies pain in the left side of chest.  The pt also has some SOB with increased pulse and has been using inhaler more. Appt moved up to today at 2:45 with Dr Graciela Husbands.  Follow-up by: Julieta Gutting, RN, BSN,  June 11, 2010 10:42 AM

## 2010-06-18 NOTE — Progress Notes (Signed)
Summary: nos appt  Phone Note Call from Patient   Caller: [email or web address]  Call For: Malin Cervini Summary of Call: Rsc nos from 2/14 to 3/6. Initial call taken by: Darletta Moll,  June 11, 2010 9:39 AM

## 2010-06-20 ENCOUNTER — Telehealth (INDEPENDENT_AMBULATORY_CARE_PROVIDER_SITE_OTHER): Payer: Self-pay | Admitting: *Deleted

## 2010-06-23 ENCOUNTER — Telehealth (INDEPENDENT_AMBULATORY_CARE_PROVIDER_SITE_OTHER): Payer: Self-pay

## 2010-06-23 ENCOUNTER — Other Ambulatory Visit (HOSPITAL_COMMUNITY): Payer: BC Managed Care – PPO

## 2010-06-24 NOTE — Progress Notes (Signed)
Summary: Nuclear Pre-Procedure  Phone Note Outgoing Call Call back at [facility] Phone [phone]   Call placed by: Stanton Kidney, EMT-P,  June 20, 2010 12:12 PM Action Taken: Phone Call Completed Summary of Call: Left message with information on Myoview Information Sheet (see scanned document for details). Stanton Kidney, EMT-P  June 20, 2010 12:12 PM      Nuclear Med Background Indications for Stress Test: Evaluation for Ischemia     Symptoms: Chest Pain, Chest Pain with Exertion, DOE, Light-Headedness, Rapid HR, SOB    Nuclear Pre-Procedure Cardiac Risk Factors: Family History - CAD, Hypertension, IDDM Type 2, Lipids, Obesity Height (in): 66.5

## 2010-06-25 ENCOUNTER — Institutional Professional Consult (permissible substitution): Payer: Self-pay | Admitting: Internal Medicine

## 2010-07-01 ENCOUNTER — Encounter: Payer: Self-pay | Admitting: Pulmonary Disease

## 2010-07-01 ENCOUNTER — Institutional Professional Consult (permissible substitution) (INDEPENDENT_AMBULATORY_CARE_PROVIDER_SITE_OTHER): Payer: Self-pay | Admitting: Pulmonary Disease

## 2010-07-01 DIAGNOSIS — R0602 Shortness of breath: Secondary | ICD-10-CM

## 2010-07-02 ENCOUNTER — Telehealth: Payer: Self-pay | Admitting: Pulmonary Disease

## 2010-07-03 NOTE — Progress Notes (Signed)
Summary: No Show  Phone Note Outgoing Call Call back at [facility] Phone [phone]   Call placed by: Irean Hong, RN,  June 23, 2010 2:31 PM Summary of Call: Left message for the patient to call us back regarding no show for Myoview and Echo today. Notified Dr. Odessa Fleming office of the no show. Menelik Mcfarren,RN.

## 2010-07-08 NOTE — Miscellaneous (Signed)
Summary: Orders Update  Clinical Lists Changes  Orders: Added new Service order of No Show NS50 (NS50) - Signed 

## 2010-07-08 NOTE — Progress Notes (Signed)
Summary: nos appt  Phone Note Call from Patient   Caller: [email or web address]  Call For: Floyed Masoud Summary of Call: LMTCB x2 to rsc nos from 3/6. Initial call taken by: Darletta Moll,  July 02, 2010 2:54 PM

## 2010-07-29 ENCOUNTER — Other Ambulatory Visit (HOSPITAL_COMMUNITY): Payer: Self-pay | Admitting: Internal Medicine

## 2010-07-29 DIAGNOSIS — R000 Tachycardia, unspecified: Secondary | ICD-10-CM

## 2010-07-30 ENCOUNTER — Ambulatory Visit (HOSPITAL_COMMUNITY): Payer: BC Managed Care – PPO | Attending: Internal Medicine | Admitting: Radiology

## 2010-07-30 VITALS — Ht 66.5 in | Wt 305.0 lb

## 2010-07-30 DIAGNOSIS — R079 Chest pain, unspecified: Secondary | ICD-10-CM

## 2010-07-30 DIAGNOSIS — R0989 Other specified symptoms and signs involving the circulatory and respiratory systems: Secondary | ICD-10-CM

## 2010-07-30 NOTE — Progress Notes (Signed)
[facility] SITE 3 NUCLEAR MED [address] [place] [address] [phone]  Cardiology Nuclear Med Study  Heather Myers is a 45 y.o. female [id number] 1966-01-07   Nuclear Med Background Indication for Stress Test:  Evaluation for Ischemia History:  10 years ago GXT:No report; No prior known history of CAD  Cardiac Risk Factors: Family History - CAD, Hypertension, IDDM Type 2, Lipids and Obesity  Symptoms:  Chest Pain with Exertion, DOE, Fatigue, Fatigue with Exertion, Light-Headedness, Rapid HR and SOB   Nuclear Pre-Procedure Caffeine/Decaff Intake:  None NPO After: 12:00am   Lungs:  Clear IV 0.9% NS with Angio Cath:  22g  IV Site: R Antecubital  IV Started by:  Irean Hong, RN  Chest Size (in):  44 Cup Size: n/a  Height: 5' 6.5" (1.689 m)  Weight:  305 lb (138.347 kg)  BMI:  Body mass index is 48.49 kg/(m^2). Tech Comments:  Held metoprolol since 4pm yesterday    Nuclear Med Study 1 or 2 day study: 2 day  Stress Test Type:  Treadmill/Lexiscan  Reading MD: Charlton Haws, MD  Order Authorizing Provider:  Dr. Berton Mount, MD  Resting Radionuclide: Technetium 58m Tetrofosmin  Resting Radionuclide Dose: 33 mCi   Stress Radionuclide:  Technetium 83m Tetrofosmin  Stress Radionuclide Dose: 33 mCi           Stress Protocol Rest HR: 96 Stress HR: 137  Rest BP: 131/73 Stress BP: 137/71  Exercise Time (min): 1.0 MPH, 0 % grade x 2 minutes METS: n/a   Predicted Max HR: 178 bpm % Max HR: 76.97 bpm Rate Pressure Product: 21308   Dose of Adenosine (mg):  n/a Dose of Lexiscan: 0.4 mg  Dose of Atropine (mg): n/a Dose of Dobutamine: n/a mcg/kg/min (at max HR)  Stress Test Technologist: Irean Hong, RN  Nuclear Technologist:  Domenic Polite, CNMT     Rest Procedure:  Myocardial perfusion imaging was performed at rest 45 minutes following the intravenous administration of Technetium 26m Tetrofosmin. Rest ECG: NSR with poor R wave  progression  Stress Procedure:  The patient received IV Lexiscan 0.4 mg over 15-seconds with concurrent low level exercise. Technetium 34m Tetrofosmin was injected at 30-seconds while the patient continued walking.  There were no significant changes with Lexiscan.  Quantitative spect images were obtained after a 45-minute delay. Stress ECG: No significant change from baseline ECG  QPS Raw Data Images:  Patient motion noted; appropriate software correction applied. Stress Images:  Normal homogeneous uptake in all areas of the myocardium. Rest Images:  Normal homogeneous uptake in all areas of the myocardium. Subtraction (SDS):  Normal Transient Ischemic Dilatation (Normal <1.22):  1.12 Lung/Heart Ratio (Normal <0.45):  .33  Quantitative Gated Spect Images QGS EDV:  71 ml QGS ESV:  28 ml QGS cine images:  EF normal but ? apical hypokinesis with midcavitary obliteration QGS EF: 60%  Impression Exercise Capacity:  Lexiscan with low level exercise. BP Response:  Normal blood pressure response. Clinical Symptoms:  No chest pain. ECG Impression:  No significant ST segment change suggestive of ischemia. Comparison with Prior Nuclear Study: No previous nuclear study performed  Overall Impression:  Probably motion artifact with inferior wall looking normal on vertical images.  No ischemia.  Suggest echo to further assess LV   Charlton Haws

## 2010-07-31 ENCOUNTER — Other Ambulatory Visit (HOSPITAL_COMMUNITY): Payer: BC Managed Care – PPO

## 2010-07-31 ENCOUNTER — Other Ambulatory Visit (HOSPITAL_COMMUNITY): Payer: Self-pay

## 2010-07-31 ENCOUNTER — Ambulatory Visit (HOSPITAL_COMMUNITY): Payer: BC Managed Care – PPO | Attending: Internal Medicine | Admitting: Radiology

## 2010-07-31 ENCOUNTER — Other Ambulatory Visit (HOSPITAL_COMMUNITY): Payer: BC Managed Care – PPO | Admitting: Radiology

## 2010-07-31 ENCOUNTER — Ambulatory Visit (HOSPITAL_BASED_OUTPATIENT_CLINIC_OR_DEPARTMENT_OTHER): Payer: BC Managed Care – PPO

## 2010-07-31 DIAGNOSIS — R079 Chest pain, unspecified: Secondary | ICD-10-CM

## 2010-07-31 DIAGNOSIS — I479 Paroxysmal tachycardia, unspecified: Secondary | ICD-10-CM

## 2010-07-31 DIAGNOSIS — R002 Palpitations: Secondary | ICD-10-CM

## 2010-07-31 DIAGNOSIS — R000 Tachycardia, unspecified: Secondary | ICD-10-CM

## 2010-07-31 DIAGNOSIS — E119 Type 2 diabetes mellitus without complications: Secondary | ICD-10-CM

## 2010-07-31 DIAGNOSIS — R0609 Other forms of dyspnea: Secondary | ICD-10-CM

## 2010-07-31 MED ORDER — REGADENOSON 0.4 MG/5ML IV SOLN
0.4000 mg | Freq: Once | INTRAVENOUS | Status: AC
  Administered 2010-07-31: 0.4 mg via INTRAVENOUS

## 2010-07-31 MED ORDER — TECHNETIUM TC 99M TETROFOSMIN IV KIT
33.0000 | PACK | Freq: Once | INTRAVENOUS | Status: AC | PRN
  Administered 2010-07-31: 33 via INTRAVENOUS

## 2010-07-31 MED ORDER — PERFLUTREN PROTEIN A MICROSPH IV SUSP
1.3000 mL | Freq: Once | INTRAVENOUS | Status: AC
  Administered 2010-07-31: 1.3 mL via INTRAVENOUS

## 2010-07-31 MED ORDER — TECHNETIUM TC 99M TETROFOSMIN IV KIT
33.0000 | PACK | Freq: Once | INTRAVENOUS | Status: AC | PRN
  Administered 2010-07-30: 33 via INTRAVENOUS

## 2010-08-02 LAB — URINALYSIS, ROUTINE W REFLEX MICROSCOPIC
Bilirubin Urine: NEGATIVE
Glucose, UA: NEGATIVE mg/dL
Hgb urine dipstick: NEGATIVE
Protein, ur: NEGATIVE mg/dL
Specific Gravity, Urine: 1.027 (ref 1.005–1.030)

## 2010-08-02 LAB — POCT CARDIAC MARKERS
CKMB, poc: <1 ng/mL — ABNORMAL LOW (ref 1.0–8.0)
Myoglobin, poc: 70.2 ng/mL (ref 12–200)

## 2010-08-02 LAB — POCT I-STAT, CHEM 8
HCT: 39 % (ref 36.0–46.0)
Hemoglobin: 13.3 g/dL (ref 12.0–15.0)
Potassium: 3.7 mEq/L (ref 3.5–5.1)
Sodium: 138 mEq/L (ref 135–145)
TCO2: 25 mmol/L (ref 0–100)

## 2010-08-02 LAB — POCT PREGNANCY, URINE: Preg Test, Ur: NEGATIVE

## 2010-08-04 NOTE — Progress Notes (Signed)
Please inform pt normal study Thanks

## 2010-08-05 NOTE — Progress Notes (Addendum)
LMTCB.Marland Kitchen/CY  PT AWARE OF STRESS RESULTS./CY

## 2010-08-09 ENCOUNTER — Encounter: Payer: Self-pay | Admitting: Internal Medicine

## 2010-08-11 ENCOUNTER — Ambulatory Visit (HOSPITAL_BASED_OUTPATIENT_CLINIC_OR_DEPARTMENT_OTHER): Payer: BC Managed Care – PPO

## 2010-08-12 ENCOUNTER — Ambulatory Visit (HOSPITAL_BASED_OUTPATIENT_CLINIC_OR_DEPARTMENT_OTHER): Payer: BC Managed Care – PPO | Attending: Internal Medicine

## 2010-08-12 DIAGNOSIS — G4733 Obstructive sleep apnea (adult) (pediatric): Secondary | ICD-10-CM | POA: Insufficient documentation

## 2010-08-13 ENCOUNTER — Encounter (HOSPITAL_BASED_OUTPATIENT_CLINIC_OR_DEPARTMENT_OTHER): Payer: BC Managed Care – PPO

## 2010-08-21 DIAGNOSIS — R0609 Other forms of dyspnea: Secondary | ICD-10-CM

## 2010-08-21 DIAGNOSIS — G4710 Hypersomnia, unspecified: Secondary | ICD-10-CM

## 2010-08-21 DIAGNOSIS — G4730 Sleep apnea, unspecified: Secondary | ICD-10-CM

## 2010-08-21 DIAGNOSIS — R0989 Other specified symptoms and signs involving the circulatory and respiratory systems: Secondary | ICD-10-CM

## 2010-08-22 NOTE — Procedures (Signed)
NAME:  Heather Myers, Heather Myers      ACCOUNT NO.:  [id number]  MEDICAL RECORD NO.:  [id number]          PATIENT TYPE:  OUT  LOCATION:  SLEEP CENTER                 FACILITY:  [facility]  PHYSICIAN:  Barbaraann Share, MD,FCCPDATE OF BIRTH:  04-20-1968  DATE OF STUDY:  08/12/2010                           NOCTURNAL POLYSOMNOGRAM  REFERRING PHYSICIAN:  STEVEN C KLEIN  INDICATION FOR STUDY:  Hypersomnia with sleep apnea.  EPWORTH SCORE:  1.  SLEEP ARCHITECTURE:  The patient had a total sleep time of 309 minutes with only 3.5 minutes of slow wave sleep and only 43 minutes of REM. Sleep onset latency was normal at 17 minutes, and REM onset was at the upper limits of normal at 120 minutes.  Sleep efficiency was mildly reduced at 87%.  RESPIRATORY DATA:  The patient was found to have 3 apneas and 43 obstructive hypopneas, giving her an apnea/hypopnea index of 9 events per hour.  Events occurred in all body positions, but were most prominent during REM.  Mild to moderate snoring was noted.  OXYGEN DATA:  There was O2 desaturation as low as 84% with the patient's obstructive events.  CARDIAC DATA:  No clinically significant arrhythmias were noted.  MOVEMENT/PARASOMNIA:  The patient had no significant leg jerks or other abnormal behaviors seen.  IMPRESSION/RECOMMENDATIONS:  Mild obstructive sleep apnea/hypopnea syndrome with an apnea-hypopnea index of 9 events per hour and O2 desaturation as low as 84%.  Treatment for this degree of sleep apnea can include a trial of weight loss alone, upper airway surgery, dental appliance, and also CPAP.  Clinical correlation is suggested.     Barbaraann Share, MD,FCCP Diplomate, American Board of Sleep Medicine Electronically Signed    [facility]/MEDQ  D:  08/21/2010 15:53:06  T:  08/22/2010 03:50:38  Job:  [id number]

## 2010-09-09 NOTE — Assessment & Plan Note (Signed)
[facility]                             PULMONARY OFFICE NOTE   NAME:Heather Myers, Heather Myers             MRN:          [id number]  DATE:09/28/2006                            DOB:          Oct 03, 1965    HISTORY OF PRESENT ILLNESS:  Patient is a 45 year old African-American  female patient of Dr. Jonny Ruiz, who has a known history of diabetes  mellitus, hypertension, rhinitis, high blood pressure, and morbid  obesity, who presents with a one week history of nasal congestion, sinus  pain and pressure, right ear pain and shield.  The patient denies any  chest pain, shortness of breath, orthopnea, PND, leg swelling, recent  travel or antibiotic use.  She denies any use of over-the-counter  products.   PAST MEDICAL HISTORY:  Reviewed.   CURRENT MEDICATIONS:  Reviewed.   PHYSICAL EXAMINATION:  GENERAL:  Patient is a morbidly obese female in  no acute distress.  VITAL SIGNS:  She is afebrile with stable vital signs.  O2 saturation is  99% on room air.  HEENT:  Nasal mucosa is erythematous.  Maxillary sinus tenderness to  percussion.  Some mild bulging of the right TM.  The left TM is normal.  NECK:  Supple without adenopathy.  No JVD.  LUNGS:  The lung sounds are clear.  CARDIAC:  Regular rate.  ABDOMEN:  Soft and nontender.  EXTREMITIES:  Warm without any calf cyanosis, clubbing, or edema.   IMPRESSION/PLAN:  Acute sinusitis.  Patient is given Omnicef x10 days.  Added Mucinex twice daily.  Nasal hygiene regimen with saline and Afrin  nasal spray.  Patient is to return back with Dr. Jonny Ruiz as scheduled or  sooner if needed.      Rubye Oaks, NP  Electronically Signed      Lonzo Cloud. Kriste Basque, MD  Electronically Signed   TP/MedQ  DD: 09/28/2006  DT: 09/29/2006  Job #: [id number]

## 2010-09-10 LAB — TSH: TSH: 1.5 u[IU]/mL (ref 0.41–5.90)

## 2010-09-10 LAB — HEMOGLOBIN A1C: Hgb A1c MFr Bld: 7.7 % — AB (ref 4.0–6.0)

## 2010-09-10 LAB — LIPID PANEL: LDL Cholesterol: 127 mg/dL

## 2010-09-12 NOTE — H&P (Signed)
[facility] of [facility]  Patient:    Heather Myers, Heather Myers Visit Number: [id number] MRN: [id number]          Service Type: OBS Location: 910A 9105 01 Attending Physician:  Shaune Spittle Dictated by:   Philipp Deputy, C.N.M. Admit Date:  12/21/2000 Discharge Date: 12/24/2000                           History and Physical  DATE OF BIRTH:                06-14-1965.  HISTORY:                      Heather Myers is a 45 year old, gravida 3, para 2-0-1-2, who is status post cesarean section x four days who presents with shortness of breath with activity as well as edema of the lower extremities which is not improving as well as an incidence this morning of severe back pain. She was discharged from [facility] [place] on December 24, 2000 on postoperative day #3 from her cesarean section. Her pregnancy had been followed by the [facility] OB/GYN MD service and had been complicated by (1) insulin-dependent diabetes, (2) history of HSV, (3) history of previous cesarean section, (4) Rh negative, (5) obesity, (6) questionable LMP, (7) first trimester spotting, and (8) positive group B strep. Her cesarean section took place on December 22, 2000 by Dr. Dierdre Forth with delivery of a viable female infant Waldron Labs weighing 7 pounds 13 ounces. Her postoperative course was uneventful. She was breast-feeding. Her blood sugars remained stable and on postoperative day #3 she was deemed to have received the full benefit of her hospital stay and was discharged home.  PAST MEDICAL HISTORY:  ALLERGIES:                    No known drug allergies.  MEDICAL HISTORY:              She has a history of diabetes mellitus which is controlled with insulin and she is obese.  OBSTETRICAL HISTORY:          She has had cesarean section x 2 in 1998 and just recently on December 21, 2000.  GYNECOLOGICAL HISTORY:        Her Gyn history is remarkable for spontaneous AB with D&C in  May of 2000.  OBJECTIVE DATA:  VITAL SIGNS:                  Blood pressure 169/83. Her other vital signs are stable and she is afebrile.  HEENT:                        Grossly within normal limits.  CHEST:                        Lungs are decreased in the bases with slight questionable crackles.  HEART:                        Regular to rate and rhythm.  ABDOMEN:                      Soft and appropriately tender. Her incision is clean, dry, and intact.  BACK:  Negative for costovertebral angle tenderness. She does have some tenderness near her spinal site.  PELVIC:                       Deferred.  EXTREMITIES:                  Remarkable for 2+ edema.  LABORATORY DATA:              Complete blood count:  Hemoglobin is 10.4, white blood cell count is 9.5, platelets are 459,000. Her SGOT is 46 and SGPT is 48.  ASSESSMENT:                   1. Status post cesarean section.                               2. Rule out pulmonary edema.                               3. Musculoskeletal back pain.                               4. Insulin-dependent diabetes mellitus.                               5. Increased blood pressure.  PLAN:                         1. Admit for 23-hour observation per consult                                  with Dr. Stefano Gaul.                               2. Lasix therapy. Dictated by:   Philipp Deputy, C.N.M. Attending Physician:  Shaune Spittle DD:  12/25/00 TD:  12/25/00 Job: [id number]

## 2010-09-12 NOTE — Discharge Summary (Signed)
[facility]  Patient:    Heather Myers, Heather Myers                    MRN: [id number] Adm. Date:  [id number] Disc. Date: [id number] Attending:  Leonard Schwartz Dictator:   Nigel Bridgeman, C.N.M.                           Discharge Summary  DATE OF BIRTH:                07-May-1965.  ADMITTING DIAGNOSES:          1. Intrauterine pregnancy at 37 weeks.                               2. Insulin-dependent diabetes.                               3. Nausea and vomiting with ketonuria.                               4. Nonreassuring fetal heart rate.  DISCHARGE DIAGNOSES:          1. Intrauterine pregnancy at 37 weeks.                               2. Insulin-dependent diabetes.                               3. Resolution of ketonuria.                               4. Reassuring fetal status.  PROCEDURES:                   None.  HOSPITAL COURSE:              Heather Myers is a 45 year old, gravida 3, para 1-0-1-1, at 37 weeks who was seen at the office on December 08, 2000 with a 24-hour history of nausea and vomiting and inability to keep anything down for 24 hours. She had moderate ketones at the office. She was therefore sent to Maternity Admissions for monitoring. At that time, she had a fetal heart rate deceleration. The decision was made to admit for 23-hour observation for IV fluids and further fetal monitoring. The patient had an ultrasound scheduled already on August 16, therefore, she received IV fluids overnight on August 14. She had a negative ketone check in the morning of August 15. She also had an OB ultrasound with a biophysical profile which showed estimated fetal weight of 2894 g with a normal AFI. BPP was 8/8. Fetal heart rate remained within normal limits. Labs that were done on the evening of August 14 were within normal limits. The patient had resolution of her nausea and vomiting. She was deemed to have received the full benefit of her  hospital stay and was discharged home.  DISCHARGE INSTRUCTIONS:       The patient is to continue with increased rest. She is to continue her nonstress tests twice a week. She will continue her insulin dosing regimen and she  will follow up as needed in the office.  DISCHARGE MEDICATIONS:        She will continue her insulin regimen.  DISCHARGE FOLLOWUP:           Followup will occur with a visit at [facility] next weeks and two NSTs per week until delivery. DD:  12/09/00 TD:  12/09/00 Job: [id number]

## 2010-09-12 NOTE — H&P (Signed)
[facility] of [facility]  Patient:    Heather Myers, Heather Myers                    MRN: [id number] Adm. Date:  [id number] Attending:  Leonard Schwartz Dictator:   Mack Guise, C.N.M.                         History and Physical  HISTORY OF PRESENT ILLNESS:   Heather Myers is a 45 year old gravida 3 para 1-0-1-1 at [redacted] weeks gestation.  She has a history of insulin-dependent diabetes and presented to the office of [facility] today with nausea and vomiting x 24 hours and moderate ketonuria.  She was sent from the office to MAU for IV fluids and Phenergan.  She does report positive fetal movements; no cramping, contractions, or pain.  She is presently feeling better following administration of IV fluids and Phenergan but fetal heart rate tracing was nonreassuring with one prolonged deep deceleration x 80 seconds down to 70-80 with return to baseline and mild variable decelerations noted.  Otherwise, strip has been reactive at times.  Patient had mild uterine irritability on admission which has since stopped.  She is to be admitted for 23-hour observation per Dr. Marline Backbone.  This patients pregnancy has been followed by the M.D. service at [facility] and is remarkable for: 1. Insulin-dependent diabetes. 2. Previous C section. 3. History HSV. 4. First trimester spotting. 5. Questionable LMP. 6. Obesity.  This patients pregnancy was initially evaluated at the office of [facility] in February 2002.  OBSTETRICAL HISTORY:          In July 1998, patient had a cesarean delivery with the birth of a 6 pound 3 ounce female infant at 13 weeks secondary to an HSV outbreak.  In May 2000 patient had a six-week SAB with a D&C, and the present pregnancy.  MEDICAL HISTORY:              History of diabetes.  History of abnormal Pap and cryo.  History of HSV.  Trichomonas diagnosed in January 2002 and treated. Patient has a history of positive PPD with treatment for six months  and diabetes.  FAMILY HISTORY:               Mother and maternal grandfather with a history of hypertension.  Patients mother with history of kidney disease and dialysis and CVA.  Maternal grandfather - Alzheimers disease.  PREGNANCY COURSE:             This patients pregnancy has been complicated by diabetes.  Her blood sugars have been fairly stable throughout pregnancy.  She has remained size equal to dates throughout, normotensive, with no proteinuria.  Ultrasounds and NSTs/antenatal testing throughout has been within normal limits to this point.  GENETIC HISTORY:              Unremarkable.  There is no family history of genetic or chromosomal disorders, children that died in infancy or that were born with birth defects.  SOCIAL HISTORY:               Heather Myers is a 45 year old African-American divorced female.  The father of the baby, Sula Rumple, is involved and supportive.  They are of the [place] faith.  REVIEW OF SYSTEMS:            As described above.  Patient is typical of uterine pregnancy at 37 weeks admitted with nausea and vomiting.  PHYSICAL EXAMINATION:  VITAL SIGNS:                  Stable, afebrile.  HEENT:                        Unremarkable.  HEART:                        Regular rate and rhythm.  LUNGS:                        Clear.  ABDOMEN:                      Gravid in its contour.  Uterine fundus is noted to extend 37 cm above the level of the pubic symphysis.   Fetal heart rate tracing was initially reacted and then found to have a prolonged deceleration following the administration of Phenergan and IV fluids.  Fetal heart rate went down to 70-80 x 80-90 seconds with return to baseline.  Fetal heart rate has since recovered.  Accelerations are noted as well as mild variable decelerations.  Uterine irritability was noted initially on the fetal heart rate monitor strip but has since ceased following fluids.  EXTREMITIES:                   Show no pathologic edema.  DTRs are 1+ with no clonus.  ASSESSMENT:                   1. Intrauterine pregnancy at 37 weeks.                               2. Insulin-dependent diabetes.                               3. Nausea and vomiting.                               4. Ketonuria.                               5. Nonreassuring fetal heart rate tracing.  PLAN:                         Admit per Dr. Marline Backbone for 23-hour observation.  Complete OB ultrasound.  Comprehensive metabolic panel. Continuous electronic fetal monitoring.  Diet 2100 calorie ADA.  IV fluids one-half normal saline at 125 cc/hour and blood sugar parameters as ordered. DD:  12/08/00 TD:  12/08/00 Job: [id number]

## 2010-09-12 NOTE — Discharge Summary (Signed)
[facility] of [facility]  Patient:    Heather Myers, Heather Myers Visit Number: [id number] MRN: [id number]          Service Type: OBS Location: 910A 9105 01 Attending Physician:  Shaune Spittle Dictated by:   Saverio Danker, C.N.M. Admit Date:  12/21/2000 Discharge Date: 12/24/2000                             Discharge Summary  ADMISSION DIAGNOSES:          1. Intrauterine pregnancy at term.                               2. Insulin-dependent diabetes.                               3. Previous cesarean section.                               4. Desires repeat cesarean section.  DISCHARGE DIAGNOSES:          1. Intrauterine pregnancy at term.                               2. Insulin-dependent diabetes.                               3. Previous cesarean section.                               4. Desires repeat cesarean section.                               5. Breast-feeding.  PROCEDURES THIS ADMISSION:    Repeat low transverse cesarean section for delivery of a viable female infant named Waldron Labs, who weighed 7 pounds 13 ounces and had Apgars of 9 and 9 on December 21, 2000, by [employer]. Haygood, M.D., and Naima A. Normand Sloop, M.D.  The patient also had a right ovarian cyst that was removed during cesarean section.  HOSPITAL COURSE:              Heather Myers is a 45 year old, married, black female, gravida 3, para 1-0-1-1, at term, who presented for repeat cesarean section secondary to previous cesarean section and a history of insulin-dependent diabetes.  She underwent the same for delivery of a viable female infant named Waldron Labs, who weighed 7 pounds 13 ounces and had Apgars of 9 and 9, attended by [employer]. Haygood, M.D., and Naima A. Normand Sloop, M.D., on December 21, 2000.  Please see the operative note for details.  Postoperatively, she has done well.  She is ambulating, voiding, and eating without difficulty. Her vital signs are stable and she has been afebrile  throughout her hospital stay.  Her blood sugars have been relatively stable in the office throughout her hospital stay.  She is noted to have some serosanguineous drainage from her incision but is deemed ready for discharge today.  DISCHARGE INSTRUCTIONS:       As per the [facility] OB[facility]GYN handout.  DISCHARGE MEDICATIONS:  1. Motrin 600 mg p.o. q.6h. p.r.n. pain.                               2. Tylox one to two p.o. q.4-6h. p.r.n. for                                  pain.                               3. Insulin as directed by Dr. Chestine Spore, which                                  currently is 8 units of NPH b.i.d.  DISCHARGE LABORATORY DATA:    Her hemoglobin is 10.3, her WBC count is 12.8, and her platelets are 378.  DISCHARGE FOLLOW-UP:          Will be on Tuesday for staple removal, in six weeks for a routine postoperative visit, and with Dr. Chestine Spore in approximately two weeks. Dictated by:   Vance Gather Duplantis, C.N.M. Attending Physician:  Shaune Spittle DD:  12/24/00 TD:  12/24/00 Job: [id number]

## 2010-09-12 NOTE — H&P (Signed)
[facility]  Patient:    Heather Myers, VIAR Visit Number: [id number] MRN: [id number]          Service Type: ANT Location: MATC Attending Physician:  Leonard Schwartz Dictated by:   Pierre Bali Normand Sloop, M.D. Adm. Date:  11/18/2000 Disc. Date: 12/18/2000                           History and Physical  HISTORY OF PRESENT ILLNESS:   The patient is a 45 year old African-American female, G3, P1-0-1-1, whose EDC is December 29, 2000, by a 9-week ultrasound which makes her 38-6/7 weeks.  The patient is for a repeat elective cesarean section.  Prenatal care was a [address] [place].  COMPLICATIONS:                1. She is insulin-dependent diabetic for 11                                  years before conception.                               2. History of previous cesarean section.                               3. History of herpes simplex virus.                               4. Obesity.                               5. Rh-negative.  The patient did received                                  RhoGAM.  PRENATAL LABORATORY DATA:     Last hemoglobin was 11.9, platelets of 404.  She is Rh-negative, O negative, antibody negative.  Sickle cell negative. Hemoglobin A1c was 6.1.  RPR was nonreactive.  Rubella-immune.  Hepatitis B surface antigen negative.  GC and Chlamydia are both negative.  GBS positive. MSAFP and Pap within normal range.  PAST OBSTETRICAL HISTORY:     Significant for a full term C-section July 1998 and a D&E in May of 2000 for a blighted ovum.  PAST MEDICAL HISTORY:         As above.  PAST SURGICAL HISTORY:        She had a C-section x1, D&E x1, and cervical cryosurgery.  ALLERGIES:                    The patient has no known drug allergies.  FAMILY HISTORY:               Significant for hypertension and cardiovascular accident in mother and maternal grandmother.  SOCIAL HISTORY:               Negative x3.  PAST  GYNECOLOGIC HISTORY:     Menarche at age 8 occurring every 28 days, lasting for 4-5 days.  History of abnormal Pap which required cryo and repeat Paps have been normal.  The patient  is for a repeat C-section.  Physical examination will be done on admission. Dictated by:   Pierre Bali. Normand Sloop, M.D. Attending Physician:  Leonard Schwartz DD:  12/20/00 TD:  12/20/00 Job: [id number]

## 2010-09-12 NOTE — Op Note (Signed)
[facility]  Patient:    Heather Myers, Heather Myers Visit Number: [id number] MRN: [id number]          Service Type: DSU Location: [id number] Attending Physician:  Sonda Primes Dictated by:   Mardene Celeste. Lurene Shadow, M.D. Proc. Date: 01/26/01 Admit Date:  01/26/2001                             Operative Report  PREOPERATIVE DIAGNOSIS:  Chronic calculous cholecystitis.  POSTOPERATIVE DIAGNOSIS:  Chronic calculous cholecystitis.  PROCEDURE:  Laparoscopic cholecystectomy with intraoperative cholangiogram.  SURGEON:  Luisa Hart L. Lurene Shadow, M.D.  ASSISTANT:  Marnee Spring. Wiliam Ke, M.D.  ANESTHESIA:  General.  INDICATION:  The patient is a 45 year old recently postpartum diabetic woman presenting with epigastric and right upper quadrant pain associated with nausea and with emesis.  She was evaluated by ultrasound and noted to have chronic cholelithiasis.  Liver function studies done did not show any elevation of her liver function except for a mild elevation in her alkaline phosphatase up to 123.  Amylase was normal at 103.  She is brought to the operating room now for laparoscopic cholecystectomy with intraoperative cholangiography.  DESCRIPTION OF PROCEDURE:  Following the induction of satisfactory general anesthesia with the patient positioned supinely, the abdomen was routinely prepped and draped to be included in the sterile operative field.  Open laparoscopy with insertion of a Hasson cannula at the umbilicus and insufflation of the peritoneal cavity was carried out using carbon dioxide up to 14 mmHg pressure.  Visual exploration of the abdomen was carried out.  The uterus appeared to be of normal size.  The adnexal structures were not viewed. None of the small or large intestine appeared to be abnormal.  The gallbladder was chronically scarred with multiple adhesions to it, and it was packed with multiple gallstones.  The liver surfaces were smooth.   Liver edges were sharp. Anterior gastric wall and duodenal sweep appeared to be normal.  Under direct vision, epigastric and lateral ports were placed.  The gallbladder was grasped and retracted cephalad, dissection carried down to the region of the ampulla with isolation of the cystic artery and cystic duct. The cystic artery was traced up to its entrance in the gallbladder wall and then was doubly clipped and transected.  The cystic duct was traced to its entry into the ampulla of the gallbladder, and then it was clipped proximally. The cystic duct was opened and a cystic duct cholangiogram carried out with one-half strength Hypaque.  The resulting cholangiogram showed normal-caliber ducts with free flow of contrast into the duodenum.  The cystic duct was doubly clipped and transected after the cystic duct catheter was removed, and the gallbladder was then dissected free from the liver bed using electrocautery and maintaining hemostasis throughout the course of the dissection.  Having retrieved the gallbladder and removed the gallbladder, the gallbladder was retrieved using an Endocatch.  The gallbladder was removed and forwarded for pathologic evaluation.  The peritoneal cavity was then thoroughly inspected, additional bleeding points treated with electrocautery, and sponge, instrument, and sharp counts verified.  The wound was closed in layers as follows:  The midline closed with 0 Dexon and 4-0 Dexon, epigastric and lateral sites closed with 4-0 Dexon sutures.  All wounds reinforced with Steri-Strips and sterile dressings applied.  The anesthetic reversed.  Patient removed from the operating room to the recovery room in stable condition.  She tolerated the  procedure well. Dictated by:   Mardene Celeste. Lurene Shadow, M.D. Attending Physician:  Sonda Primes DD:  01/26/01 TD:  01/26/01 Job: [id number]

## 2010-09-12 NOTE — H&P (Signed)
[facility]  Patient:    Heather Myers, Heather Myers Visit Number: [id number] MRN: [id number]          Service Type: ANT Location: [facility] Attending Physician:  Leonard Schwartz Dictated by:   Vance Gather Duplantis, C.N.M. Adm. Date:  11/18/2000 Disc. Date: 12/18/2000                           History and Physical  HISTORY:                      [name] is a 45 year old married black female gravida 3, para 1-0-1-1 at [redacted] weeks gestation admitted for repeat cesarean section secondary to history of previous cesarean section and history of insulin-dependent diabetes during this pregnancy as well as prior to this pregnancy.  She denies any visual disturbances or headache.  She denies any nausea, vomiting, leaking, or vaginal bleeding.  Her pregnancy has been followed by [facility] by the M.D. service and has been complicated by insulin-dependent diabetes, history of HSV, history of previous cesarean section, Rh negative, obesity, questionable LMP, and first trimester spotting.  Her group B Strep is also positive.  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  In May 2000 she had a miscarriage at approximately 6 weeks.  She has no other OB/GYN problems.  PAST GYNECOLOGICAL HISTORY:   She did have cryo surgery for abnormal Pap.  ALLERGIES:                    No known drug allergies.  PAST MEDICAL HISTORY:         Usual childhood diseases.  Immune to hepatitis B.  She has had the ______ vaccination.  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  She has insulin-dependent diabetes for the last 11 years.  She reports occasional urinary tract infections.  PAST SURGICAL HISTORY:        Cesarean section in 1998, D&C in 2000.  FAMILY HISTORY:                Significant for multiple family members with hypertension.  Mother with type 2 diabetes along with maternal grandfather and maternal aunts.  Mother requires dialysis, has had a CVA.  Maternal grandfather has Alzheimers disease.  GENETIC HISTORY:              Negative.  SOCIAL HISTORY:               She is married.  The father of the baby is involved and supportive.  She is employed full-time.  Her husband is unemployed.  They are of the [place] faith.  They deny any illicit drug use, alcohol, or smoking with this pregnancy.  PRENATAL LABORATORIES:        Blood type O-.  Antibody screen negative. Sickle cell trait negative.  Syphilis nonreactive.  Rubella immune.  Hepatitis B surface antigen negative.  GC and chlamydia negative.  Maternal serum alpha fetoprotein within normal range.  A 36 beta Strep was positive.  PHYSICAL EXAMINATION  VITAL SIGNS:                  Stable.  She is afebrile.  HEENT:  Grossly within normal limits.  HEART:                        Regular rhythm and rate.  CHEST:                        Clear.  BREASTS:                      Soft and nontender.  ABDOMEN:                      Obese and gravid with fetal heart rate in the 150s by Doppler.  No regular uterine contractions are noted.  PELVIC:                       Deferred.  EXTREMITIES:                  Within normal limits.  ASSESSMENT:                   1. Intrauterine pregnancy at term.                               2. Insulin-dependent diabetes.                               3. Previous cesarean section, desires repeat.  PLAN:                         Admit to [facility] for elective repeat cesarean section per Dr. Dierdre Forth on December 21, 2000. Dictated by:   Vance Gather Duplantis, C.N.M. Attending Physician:  Leonard Schwartz DD:  12/20/00 TD:  12/20/00 Job: [id number]

## 2010-09-12 NOTE — Discharge Summary (Signed)
[facility]  Patient:    Heather Myers, Heather Myers Visit Number: [id number] MRN: [id number]          Service Type: OBV Location: [id number] Attending Physician:  Leonard Schwartz Dictated by:   Janine Limbo, M.D. Admit Date:  12/25/2000 Disc. Date: 12/26/00               Ricki Rodriguez, M.D.   Discharge Summary  DISCHARGE DIAGNOSES:          1. Fluid overload.                               2. Status post cesarean section on                                  December 22, 2000.                               3. Diabetes.                               4. Obesity (weight 295 pounds).                               5. Hypokalemia.                               6. Mild mitral valve regurgitation.                               7. Mild left atrial dilatation.                               8. Questionable hilar adenopathy on                                  chest x-ray.  PROCEDURES THIS ADMISSION:    1. On December 26, 2000 echocardiogram.                               2. On December 26, 2000 spiral CT scan.  HISTORY OF PRESENT ILLNESS:   [name] is a 45 year old female, gravida 3, para 2-0-1-2, who is status post cesarean section from December 22, 2000 as a repeat procedure. Her pregnancy was complicated by diabetes. She delivered a healthy infant. The patient was discharged to home on postoperative day #3. The patient did have slight proteinuria at the time of her delivery but was not noted to be hypertensive and did not have any other findings consistent with preeclampsia. However, on December 25, 2000, the patient began having progressive shortness of breath and also complained of pain in her right lower back. She presented to [facility] for evaluation. Please see history and physical exam for details.  ADMISSION PHYSICAL EXAMINATION:                  The patient was afebrile and her blood  pressure was 169/83. Her weight was 295  pounds. Her lungs had decreased breath sounds in the bases and slight rales. Her abdomen was nontender and her incision was clean, dry, and appropriately tender. Back with no CVA tenderness although she did have some tenderness in the mid spine area.  ADMISSION LABORATORY VALUES:  The patients hemoglobin was 10.4, her white blood cell count was 9500. Her platelet count was 459,000. Her chemistries were within normal limits except for an SGOT of 46 and an SGPT of 48. Her blood sugar was 106. Her urinalysis showed moderate hemoglobin but negative protein. Her chest x-ray showed mild interstitial edema with slight cardiomegaly. There was a questionable hilar adenopathy.  HOSPITAL COURSE:              The patient was admitted to the hospital and aggressive diuresis was begun with Lasix. The patient voided a large amount. Her weight on December 26, 2000 was 288 pounds. The patients exam was improved. We repeated her laboratory studies and her SGOT was 37 and her SGPT was 38. Her potassium was 3.3. We repeated her chest x-ray, however, and the chest x-ray showed a slight increased in her cardiomegaly and slight increase in her interstitial edema. And again, there was a question of hilar adenopathy noted. A spiral CT was then obtained that showed no definite pulmonary emboli and findings consistent with edema. There was a small right pleural effusion noted. Dr. Orpah Cobb from the department of cardiology saw the patient and he performed an echocardiogram. The patient was found to have normal left ventricular systolic function. Her left ventricular ejection fraction was noted to be normal. 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 There was thought to be mild mitral valve regurgitation. The left atrium was only mildly dilated. After he completed his evaluation, he felt that the patient was ready for discharge. The  patients blood sugars remained stable with her routine insulin values. Because we felt the patient did not have a peripartum cardiomyopathy, we felt that it was appropriate to discharge the patient to home. Her EKG was normal according to Dr. Algie Coffer.  DISCHARGE MEDICATIONS:        1. Toprol XL 50 mg daily.                               2. K-Dur 20 mEq twice each day.                               3. Lasix 40 mg once each day.                               4. Pain medication as needed.                               5. Insulin as needed.  FOLLOWUP INSTRUCTIONS:        The patient will return to see Dr. Algie Coffer in two weeks and she will return to [facility] in six weeks. She will maintain bed rest. She will watch her diet so that her sugars and her salt intake will be limited. She will call for questions or concerns. Dictated by:   Marlowe Sax  Stefano Gaul, M.D. Attending Physician:  Leonard Schwartz DD:  12/26/00 TD:  12/26/00 Job: [id number]

## 2010-09-15 ENCOUNTER — Encounter: Payer: Self-pay | Admitting: Internal Medicine

## 2010-10-07 ENCOUNTER — Other Ambulatory Visit: Payer: Self-pay | Admitting: Internal Medicine

## 2010-10-07 ENCOUNTER — Telehealth: Payer: Self-pay | Admitting: *Deleted

## 2010-10-07 NOTE — Telephone Encounter (Signed)
I have left a message for the patient to call regarding echo and sleep study results. It does not look like she was ever made aware of these.

## 2010-10-14 ENCOUNTER — Encounter: Payer: Self-pay | Admitting: *Deleted

## 2010-10-14 NOTE — Telephone Encounter (Signed)
Letter of results mailed to the patient.  

## 2010-10-24 ENCOUNTER — Encounter: Payer: Self-pay | Admitting: Internal Medicine

## 2010-11-27 ENCOUNTER — Other Ambulatory Visit: Payer: Self-pay | Admitting: Internal Medicine

## 2010-12-16 ENCOUNTER — Ambulatory Visit: Payer: BC Managed Care – PPO | Admitting: Gynecology

## 2010-12-28 ENCOUNTER — Other Ambulatory Visit: Payer: Self-pay | Admitting: Internal Medicine

## 2011-01-14 ENCOUNTER — Other Ambulatory Visit: Payer: Self-pay | Admitting: Internal Medicine

## 2011-01-22 LAB — DIFFERENTIAL
Basophils Absolute: 0
Basophils Relative: 0
Eosinophils Absolute: 0.3
Eosinophils Relative: 3
Lymphocytes Relative: 28
Lymphs Abs: 2.5
Monocytes Absolute: 0.5
Monocytes Relative: 5
Neutro Abs: 5.4
Neutrophils Relative %: 63

## 2011-01-22 LAB — POCT I-STAT, CHEM 8
BUN: 6
Calcium, Ion: 1.17
Chloride: 103
Creatinine, Ser: 0.6
Glucose, Bld: 269 — ABNORMAL HIGH
HCT: 33 — ABNORMAL LOW
Hemoglobin: 11.2 — ABNORMAL LOW
Potassium: 3.8
Sodium: 138
TCO2: 24

## 2011-01-22 LAB — URINALYSIS, ROUTINE W REFLEX MICROSCOPIC
Bilirubin Urine: NEGATIVE
Glucose, UA: >1000 — AB
Ketones, ur: NEGATIVE
Leukocytes, UA: NEGATIVE
Nitrite: NEGATIVE
Protein, ur: NEGATIVE
Specific Gravity, Urine: 1.019
Urobilinogen, UA: 0.2
pH: 6.5

## 2011-01-22 LAB — CBC
HCT: 31.3 — ABNORMAL LOW
Hemoglobin: 10.6 — ABNORMAL LOW
MCHC: 33.9
MCV: 86.2
Platelets: 487 — ABNORMAL HIGH
RBC: 3.63 — ABNORMAL LOW
RDW: 12.9
WBC: 8.7

## 2011-01-22 LAB — POCT PREGNANCY, URINE: Preg Test, Ur: NEGATIVE

## 2011-01-22 LAB — URINE MICROSCOPIC-ADD ON

## 2011-02-02 ENCOUNTER — Telehealth: Payer: Self-pay

## 2011-02-02 NOTE — Telephone Encounter (Signed)
Spontaneous drainage is a good thing - she needs to wash this area in the shower or bath with warm soapy water tonight and in AM - I will eval tomorrow to see if any antibiotics are needed - keep covered overnight with gauze, no neosporin or ointment recommended

## 2011-02-02 NOTE — Telephone Encounter (Signed)
Pt called stating that she has an area on her stomach about the side of a quarter that was raised and hard. Yesterday, area ruptured and the discharge was foul smelling, blood-tinged and painful. MD has available slots tomorrow but pt is requesting advisement until appt, please advise.

## 2011-02-02 NOTE — Telephone Encounter (Signed)
Pt advised and understood, she has appt scheduled for tomorrow afternoon with Dr Jonny Ruiz

## 2011-02-03 ENCOUNTER — Encounter: Payer: Self-pay | Admitting: Internal Medicine

## 2011-02-03 ENCOUNTER — Ambulatory Visit (INDEPENDENT_AMBULATORY_CARE_PROVIDER_SITE_OTHER): Payer: BC Managed Care – PPO | Admitting: Internal Medicine

## 2011-02-03 VITALS — BP 124/68 | HR 102 | Temp 98.6°F | Ht 66.0 in

## 2011-02-03 DIAGNOSIS — I10 Essential (primary) hypertension: Secondary | ICD-10-CM

## 2011-02-03 DIAGNOSIS — L02219 Cutaneous abscess of trunk, unspecified: Secondary | ICD-10-CM

## 2011-02-03 DIAGNOSIS — L02211 Cutaneous abscess of abdominal wall: Secondary | ICD-10-CM | POA: Insufficient documentation

## 2011-02-03 DIAGNOSIS — L03319 Cellulitis of trunk, unspecified: Secondary | ICD-10-CM

## 2011-02-03 DIAGNOSIS — E119 Type 2 diabetes mellitus without complications: Secondary | ICD-10-CM

## 2011-02-03 MED ORDER — DOXYCYCLINE HYCLATE 100 MG PO TABS: 100.0000 mg | ORAL_TABLET | Freq: Two times a day (BID) | ORAL | Status: AC

## 2011-02-03 NOTE — Patient Instructions (Signed)
Take all new medications as prescribed Continue all other medications as before  

## 2011-02-05 ENCOUNTER — Encounter: Payer: Self-pay | Admitting: Internal Medicine

## 2011-02-05 LAB — POCT CARDIAC MARKERS: Myoglobin, poc: 63.3

## 2011-02-05 LAB — DIFFERENTIAL
Basophils Absolute: 0
Basophils Relative: 0
Eosinophils Absolute: 0.2
Monocytes Relative: 4
Neutro Abs: 6.8
Neutrophils Relative %: 66

## 2011-02-05 LAB — CBC
MCHC: 32.9
MCV: 85.6
Platelets: 512 — ABNORMAL HIGH
RDW: 15.4 — ABNORMAL HIGH

## 2011-02-05 LAB — D-DIMER, QUANTITATIVE: D-Dimer, Quant: 0.78 — ABNORMAL HIGH

## 2011-02-05 LAB — I-STAT 8, (EC8 V) (CONVERTED LAB)
Glucose, Bld: 178 — ABNORMAL HIGH
Potassium: 3.7
TCO2: 27
pCO2, Ven: 43.8 — ABNORMAL LOW
pH, Ven: 7.377 — ABNORMAL HIGH

## 2011-02-05 LAB — POCT I-STAT CREATININE
Creatinine, Ser: 0.6
Operator id: 285491

## 2011-02-05 NOTE — Assessment & Plan Note (Signed)
stable overall by hx and exam, most recent data reviewed with pt, and pt to continue medical treatment as before  BP Readings from Last 3 Encounters:  02/03/11 124/68  06/11/10 131/83  05/19/10 118/60

## 2011-02-05 NOTE — Progress Notes (Signed)
Subjective:    Patient ID: Heather Myers, female    DOB: April 18, 1966, 45 y.o.   MRN: [id number]  HPI  Here to c/o pain and swelling to area RLQ under skin fold, with spont drainage 2 days prior but still painful and swollen, no fever, chills, and no other current boils.  Pt denies chest pain, increased sob or doe, wheezing, orthopnea, PND, increased LE swelling, palpitations, dizziness or syncope.  Pt denies new neurological symptoms such as new headache, or facial or extremity weakness or numbness   Pt denies polydipsia, polyuria.  Past Medical History  Diagnosis Date  . Diabetes mellitus   . Hypertension   . Depression   . Anxiety   . Hyperlipidemia   . Anemia   . Allergy   . Morbid obesity    Past Surgical History  Procedure Date  . Cholecystectomy   . Cesarean section     x's 2  . Rso     reports that she has never smoked. She does not have any smokeless tobacco history on file. She reports that she does not drink alcohol or use illicit drugs. family history includes Diabetes in her mother and other. Allergies  Allergen Reactions  . Ramipril    Current Outpatient Prescriptions on File Prior to Visit  Medication Sig Dispense Refill  . albuterol (PROAIR HFA) 108 (90 BASE) MCG/ACT inhaler Inhale 2 puffs into the lungs 4 (four) times daily.        Marland Kitchen BAYER BREEZE 2 TEST DISK USE AS DIRECTED  100 each  1  . citalopram (CELEXA) 20 MG tablet TAKE ONE TABLET BY MOUTH DAILY  30 tablet  5  . DIOVAN 320 MG tablet TAKE 1 TABLET BY MOUTH ONCE DAILY  30 tablet  5  . insulin aspart (NOVOLOG PENFILL) 100 UNIT/ML injection Inject 24 Units into the skin 3 (three) times daily before meals.        . insulin glargine (LANTUS) 100 UNIT/ML injection Inject 90 Units into the skin at bedtime.        . metFORMIN (GLUCOPHAGE) 500 MG tablet Take 500 mg by mouth 2 (two) times daily with a meal.        . metoprolol (TOPROL-XL) 50 MG 24 hr tablet TAKE 1 TABLET BY MOUTH DAILY  30 tablet  5  .  potassium chloride SA (K-DUR,KLOR-CON) 20 MEQ tablet Take 20 mEq by mouth daily.        Marland Kitchen torsemide (DEMADEX) 20 MG tablet Take 20 mg by mouth 2 (two) times daily.         Review of Systems Review of Systems  Constitutional: Negative for diaphoresis and unexpected weight change.  HENT: Negative for drooling and tinnitus.   Eyes: Negative for photophobia and visual disturbance.  Respiratory: Negative for choking and stridor.   Gastrointestinal: Negative for vomiting and blood in stool.  Genitourinary: Negative for hematuria and decreased urine volume.      Objective:   Physical Exam BP 124/68  Pulse 102  Temp(Src) 98.6 F (37 C) (Oral)  Ht 5\' 6"  (1.676 m)  SpO2 98% Physical Exam  VS noted, not ill appearing, morbid obese Constitutional: Pt appears well-developed and well-nourished.  HENT: Head: Normocephalic.  Right Ear: External ear normal.  Left Ear: External ear normal.  Eyes: Conjunctivae and EOM are normal. Pupils are equal, round, and reactive to light.  Neck: Normal range of motion. Neck supple.  Cardiovascular: Normal rate and regular rhythm.   Pulmonary/Chest: Effort normal  and breath sounds normal.  Abd:  Soft, NT, non-distended, + BS, but with pannus of RLQ covering 1 cm abscess s/p spont drainage no longer fluctuant, but with mild surrounding erythema, tender, red Neurological: Pt is alert. No cranial nerve deficit.  Skin: Skin is warm. No erythema.  Psychiatric: Pt behavior is normal. Thought content normal.     Assessment & Plan:

## 2011-02-05 NOTE — Assessment & Plan Note (Signed)
Mild to mod, for antibx course,  to f/u any worsening symptoms or concerns, 

## 2011-02-05 NOTE — Assessment & Plan Note (Signed)
stable overall by hx and exam, most recent data reviewed with pt, and pt to continue medical treatment as before  Lab Results  Component Value Date   HGBA1C 7.7* 09/10/2010

## 2011-02-15 ENCOUNTER — Other Ambulatory Visit: Payer: Self-pay | Admitting: Internal Medicine

## 2011-03-02 ENCOUNTER — Other Ambulatory Visit: Payer: Self-pay | Admitting: Internal Medicine

## 2011-04-07 ENCOUNTER — Ambulatory Visit (INDEPENDENT_AMBULATORY_CARE_PROVIDER_SITE_OTHER)
Admission: RE | Admit: 2011-04-07 | Discharge: 2011-04-07 | Disposition: A | Discharge status: Home / Self Care | Payer: BC Managed Care – PPO | Source: Ambulatory Visit | Attending: Internal Medicine | Admitting: Internal Medicine

## 2011-04-07 ENCOUNTER — Ambulatory Visit (INDEPENDENT_AMBULATORY_CARE_PROVIDER_SITE_OTHER): Payer: BC Managed Care – PPO | Admitting: Internal Medicine

## 2011-04-07 ENCOUNTER — Encounter: Payer: Self-pay | Admitting: Internal Medicine

## 2011-04-07 VITALS — BP 120/68 | HR 91 | Temp 98.4°F

## 2011-04-07 DIAGNOSIS — J069 Acute upper respiratory infection, unspecified: Secondary | ICD-10-CM

## 2011-04-07 DIAGNOSIS — R0600 Dyspnea, unspecified: Secondary | ICD-10-CM

## 2011-04-07 DIAGNOSIS — R0609 Other forms of dyspnea: Secondary | ICD-10-CM

## 2011-04-07 MED ORDER — OSELTAMIVIR PHOSPHATE 75 MG PO CAPS: 75.0000 mg | ORAL_CAPSULE | Freq: Two times a day (BID) | ORAL | Status: AC

## 2011-04-07 MED ORDER — ALBUTEROL SULFATE HFA 108 (90 BASE) MCG/ACT IN AERS: 2.0000 | INHALATION_SPRAY | Freq: Four times a day (QID) | RESPIRATORY_TRACT | Status: DC

## 2011-04-07 MED ORDER — PROMETHAZINE-CODEINE 6.25-10 MG/5ML PO SYRP: 5.0000 mL | ORAL_SOLUTION | ORAL | Status: AC | PRN

## 2011-04-07 NOTE — Progress Notes (Signed)
  Subjective:    HPI  complains of cold symptoms  Onset 3 days ago, gradually worsening symptoms  associated with rhinorrhea, sneezing, sore throat, mild headache and fever Also moderate-severe myalgias and mild-mod chest congestion with shortness of breath and dry coughing Symptoms worse at night Minimal relief with OTC meds Precipitated by sick contacts - dtr with flu dx last week  Past Medical History  Diagnosis Date  . Diabetes mellitus   . Hypertension   . Depression   . Anxiety   . Hyperlipidemia   . Anemia   . Allergy   . Morbid obesity     Review of Systems Constitutional: ongoing fever; no night sweats, no unexpected weight change Pulmonary: No pleurisy, wheeze or hemoptysis Cardiovascular: No chest pain or palpitations     Objective:   Physical Exam BP 120/68  Pulse 91  Temp 98.4 F (36.9 C)  SpO2 97% GEN: mildly ill appearing and audible head/chest congestion -spouse at side HENT: NCAT, mild sinus tenderness bilaterally, nares with clear discharge, oropharynx mild erythema, no exudate Eyes: Vision grossly intact, no conjunctivitis Lungs: Clear to auscultation without rhonchi or wheeze, no increased work of breathing Cardiovascular: Regular rate and rhythm, no bilateral edema      Assessment & Plan:  Viral URI  Cough, postnasal drip related to above Dyspnea without hypoxia   Explained lack of efficacy for antibiotics in viral disease - empiric Tamiflu given influenza exposure at home Check CXR due to dyspnea and asthma hx -  refill Alb MDI Prescription cough suppression - new prescriptions done Symptomatic care with Tylenol or Advil, hydration and rest -  salt gargle advised as needed

## 2011-04-07 NOTE — Patient Instructions (Signed)
It was good to see you today. Test(s) ordered today. Your results will be called to you after review (48-72hours after test completion). If any changes need to be made, you will be notified at that time. Use Tamiflu twice a day for the next 5 days and codeine cough syrup at night as needed Also continue Tylenol, abdominal or Nyquil as you're doing Rest, stay hydrated and give yourself 7-10 days to recover

## 2011-04-08 ENCOUNTER — Other Ambulatory Visit: Payer: Self-pay | Admitting: Internal Medicine

## 2011-04-08 ENCOUNTER — Encounter: Payer: Self-pay | Admitting: Internal Medicine

## 2011-04-08 ENCOUNTER — Ambulatory Visit (INDEPENDENT_AMBULATORY_CARE_PROVIDER_SITE_OTHER): Payer: BC Managed Care – PPO | Admitting: Internal Medicine

## 2011-04-08 VITALS — BP 118/78 | HR 79 | Temp 98.3°F

## 2011-04-08 DIAGNOSIS — J45909 Unspecified asthma, uncomplicated: Secondary | ICD-10-CM

## 2011-04-08 DIAGNOSIS — J45901 Unspecified asthma with (acute) exacerbation: Secondary | ICD-10-CM

## 2011-04-08 DIAGNOSIS — J40 Bronchitis, not specified as acute or chronic: Secondary | ICD-10-CM

## 2011-04-08 MED ORDER — DOXYCYCLINE HYCLATE 100 MG PO TABS: 100.0000 mg | ORAL_TABLET | Freq: Two times a day (BID) | ORAL | Status: AC

## 2011-04-08 MED ORDER — PREDNISONE (PAK) 10 MG PO TABS: 10.0000 mg | ORAL_TABLET | ORAL | Status: AC

## 2011-04-08 MED ORDER — METHYLPREDNISOLONE ACETATE 80 MG/ML IJ SUSP
120.0000 mg | Freq: Once | INTRAMUSCULAR | Status: AC
  Administered 2011-04-08: 120 mg via INTRAMUSCULAR

## 2011-04-08 NOTE — Progress Notes (Signed)
  Subjective:    Patient ID: Heather Myers, female    DOB: May 11, 1965, 45 y.o.   MRN: [id number]  HPI See office visit yesterday Symptoms worse with increased chest tightness, wheeze Request nebulizer treatment and steroids  PMH reviewed   Review of Systems Constitutional: No fever Cardiovascular: No chest pain or edema    Objective:   Physical Exam BP 118/78  Pulse 79  Temp(Src) 98.3 F (36.8 C) (Oral)  SpO2 98% Gen: No acute distress, mildly uncomfortable and ill Lungs: Moderate air movement with mild expiratory wheeze before no treatment; after albuterol treatment, improved air movement without wheeze. Clear to auscultation bilaterally  Dg Chest 2 View  04/07/2011   *RADIOLOGY REPORT*  Clinical Data: Cough and shortness of breath.  CHEST - 2 VIEW  Comparison: 12/13/2008  Findings: Asymmetric bronchial prominence at the right lung base. Early infiltrate is not excluded.  No evidence of edema or pleural fluid.  The heart size is at the upper limits of normal.  The bony thorax is unremarkable.  IMPRESSION: Right lower lung bronchial prominence with potential early right lower lung infiltrate.  Original Report Authenticated By: Reola Calkins, M.D.      Assessment & Plan:  Bronchitis versus early right lower lobe pneumonia Asthma exacerbation, acute  Albuterol nebulizer and IM Medrol done today Continue doxycycline antibiotics twice daily x1 week has called in earlier today, see chest x-ray result note Continue cough syrup and symptomatic care as advised yesterday

## 2011-04-08 NOTE — Patient Instructions (Signed)
You've been given an albuterol nebulizer treatment and Medrol steroid shot today Six-day prednisone pack also since your pharmacy -take this in addition to doxycycline antibiotics and treatment as prescribed yesterday

## 2011-04-09 ENCOUNTER — Ambulatory Visit: Payer: BC Managed Care – PPO | Admitting: Internal Medicine

## 2011-04-17 ENCOUNTER — Other Ambulatory Visit: Payer: Self-pay | Admitting: Internal Medicine

## 2011-05-02 ENCOUNTER — Other Ambulatory Visit: Payer: Self-pay | Admitting: Internal Medicine

## 2011-05-04 ENCOUNTER — Telehealth: Payer: Self-pay | Admitting: *Deleted

## 2011-05-04 NOTE — Telephone Encounter (Signed)
Duplicate request already sent torsemide to pharmacy...05/04/11@10 :28am/LMB

## 2011-07-03 ENCOUNTER — Other Ambulatory Visit: Payer: Self-pay | Admitting: Internal Medicine

## 2011-07-14 ENCOUNTER — Other Ambulatory Visit: Payer: Self-pay | Admitting: Internal Medicine

## 2011-07-21 ENCOUNTER — Ambulatory Visit (INDEPENDENT_AMBULATORY_CARE_PROVIDER_SITE_OTHER): Payer: BC Managed Care – PPO | Admitting: Internal Medicine

## 2011-07-21 ENCOUNTER — Encounter: Payer: Self-pay | Admitting: Internal Medicine

## 2011-07-21 ENCOUNTER — Other Ambulatory Visit (INDEPENDENT_AMBULATORY_CARE_PROVIDER_SITE_OTHER): Payer: BC Managed Care – PPO

## 2011-07-21 VITALS — BP 122/70 | HR 101 | Temp 98.4°F | Ht 66.0 in | Wt 318.0 lb

## 2011-07-21 DIAGNOSIS — L0292 Furuncle, unspecified: Secondary | ICD-10-CM

## 2011-07-21 DIAGNOSIS — L0293 Carbuncle, unspecified: Secondary | ICD-10-CM

## 2011-07-21 DIAGNOSIS — E119 Type 2 diabetes mellitus without complications: Secondary | ICD-10-CM

## 2011-07-21 DIAGNOSIS — F411 Generalized anxiety disorder: Secondary | ICD-10-CM

## 2011-07-21 LAB — HEMOGLOBIN A1C: Hgb A1c MFr Bld: 8.4 % — ABNORMAL HIGH (ref 4.6–6.5)

## 2011-07-21 LAB — BASIC METABOLIC PANEL
CO2: 27 mEq/L (ref 19–32)
Calcium: 8.6 mg/dL (ref 8.4–10.5)
Potassium: 3.3 mEq/L — ABNORMAL LOW (ref 3.5–5.1)
Sodium: 137 mEq/L (ref 135–145)

## 2011-07-21 MED ORDER — ALPRAZOLAM 0.25 MG PO TABS: 0.2500 mg | ORAL_TABLET | Freq: Two times a day (BID) | ORAL | Status: AC | PRN

## 2011-07-21 MED ORDER — INSULIN GLARGINE 100 UNIT/ML ~~LOC~~ SOLN: 80.0000 [IU] | Freq: Every day | SUBCUTANEOUS | Status: DC

## 2011-07-21 MED ORDER — CITALOPRAM HYDROBROMIDE 40 MG PO TABS: 40.0000 mg | ORAL_TABLET | Freq: Every day | ORAL | Status: DC

## 2011-07-21 MED ORDER — METFORMIN HCL 500 MG PO TABS: 500.0000 mg | ORAL_TABLET | Freq: Two times a day (BID) | ORAL | Status: DC

## 2011-07-21 MED ORDER — SULFAMETHOXAZOLE-TRIMETHOPRIM 800-160 MG PO TABS: 1.0000 | ORAL_TABLET | Freq: Two times a day (BID) | ORAL | Status: AC

## 2011-07-21 MED ORDER — INSULIN ASPART 100 UNIT/ML ~~LOC~~ SOLN: 20.0000 [IU] | Freq: Three times a day (TID) | SUBCUTANEOUS | Status: DC

## 2011-07-21 NOTE — Assessment & Plan Note (Signed)
associated with panic attacks On celexa for years - prior Wellbutrin ineffective Increase celexa to 40mg  daily and use prn low dose xanax we reviewed potential risk/benefit and possible side effects - pt understands and agrees to same

## 2011-07-21 NOTE — Patient Instructions (Signed)
It was good to see you today. Increase dose of Celexa and use low-dose Xanax as needed for anxiety and panic attack symptoms Use Septra antibiotics twice daily for one week to control infection Your prescription(s) have been submitted to your pharmacy. Please take as directed and contact our office if you believe you are having problem(s) with the medication(s). we'll make referral to general surg for your recurrent boil . Our office will contact you regarding appointment(s) once made. Test(s) ordered today. Your results will be called to you after review (48-72hours after test completion). If any changes need to be made, you will be notified at that time. Medications reviewed, no other changes at this time. Refill on medication(s) as discussed today.  Please schedule followup in 3-4 months for physical, labs and diabetes mellitus check, call sooner if problems.

## 2011-07-21 NOTE — Progress Notes (Signed)
  Subjective:    Patient ID: Heather Myers, female    DOB: 21-Apr-1966, 46 y.o.   MRN: [id number]  HPI  complains of open sore on stomach over RLQ Recurrent boils same area- most recent spot noted 3 days ago - Progressively worse until spont "rupture" last PM - decreased pain and swelling No fever  Anxiety - describes panic attack last PM -   called EMS for same due to chest pain and shortness of breath No chest pain now - increased stress/anxiety due to work, $   Past Medical History  Diagnosis Date  . Diabetes mellitus type 2 in obese   . Hypertension   . Depression   . Anxiety   . Hyperlipidemia   . Anemia   . Allergy   . Morbid obesity     Review of Systems  Constitutional: Positive for fatigue. Negative for fever.  Respiratory: Negative for cough.   Cardiovascular: Negative for palpitations and leg swelling.  Gastrointestinal: Negative for nausea and vomiting.       Objective:   Physical Exam BP 122/70  Pulse 101  Temp(Src) 98.4 F (36.9 C) (Oral)  Ht 5\' 6"  (1.676 m)  Wt 318 lb (144.244 kg)  BMI 51.33 kg/m2  SpO2 97% Wt Readings from Last 3 Encounters:  07/21/11 318 lb (144.244 kg)  07/31/10 305 lb (138.347 kg)  06/11/10 309 lb (140.161 kg)   Constitutional: Heather Myers is obese, but appears well-developed and well-nourished. No distress.  Neck: Normal range of motion. Neck supple. No JVD present. No thyromegaly present.  Cardiovascular: Normal rate, regular rhythm and normal heart sounds.  No murmur heard. No BLE edema. Pulmonary/Chest: Effort normal and breath sounds normal. No respiratory distress. Heather Myers has no wheezes.  Abdominal: Soft. Bowel sounds are normal. Heather Myers exhibits no distension. There is no tenderness. no masses Skin: RLQ granulomatous area with purulent drainage - tender but no fluctuance or celluliitis - remaining skin is warm and dry. No rash noted. No erythema.  Psychiatric: Heather Myers has a normal mood and affect. Her behavior is normal. Judgment  and thought content normal.   Lab Results  Component Value Date   WBC 9.3 12/21/2008   HGB 13.3 12/21/2008   HGB 12.5 12/21/2008   HCT 39.0 12/21/2008   HCT 36.4 12/21/2008   PLT 486.0* 12/21/2008   GLUCOSE 280* 06/11/2010   CHOL 213* 09/10/2010   TRIG 214* 09/10/2010   HDL 43 09/10/2010   LDLCALC 127 09/10/2010   ALT 12 01/13/2007   AST 16 01/13/2007   NA 135 06/11/2010   K 4.1 06/11/2010   CL 100 06/11/2010   CREATININE 0.65 06/11/2010   BUN 9 06/11/2010   CO2 25 06/11/2010   TSH 1.50 09/10/2010   HGBA1C 7.7* 09/10/2010   MICROALBUR 0.3 08/23/2006   EMS EKG from last PM - NSR at 80bpm, no ischemic changes    Assessment & Plan:   RLQ chronic boil - recurrent spont drainage - Septra x 7 week - refer to gen surg  Also see problem list. Medications and labs reviewed today.

## 2011-07-21 NOTE — Assessment & Plan Note (Signed)
Variable compliance, complicated by obesity and insulin resistance Check A1c now, out for last 72 hours but will titrate and adjust medicines as needed On ARB, pt refuses statin -  Reminded of need for annual eye exam Lab Results  Component Value Date   HGBA1C 7.7* 09/10/2010

## 2011-07-22 ENCOUNTER — Encounter (INDEPENDENT_AMBULATORY_CARE_PROVIDER_SITE_OTHER): Payer: Self-pay | Admitting: Surgery

## 2011-07-22 ENCOUNTER — Telehealth: Payer: Self-pay | Admitting: *Deleted

## 2011-07-22 MED ORDER — METFORMIN HCL 500 MG PO TABS: ORAL_TABLET | ORAL | Status: DC

## 2011-07-22 NOTE — Telephone Encounter (Signed)
Called pt with lab results. Pt states rx for metformin was incorrect. She take 2 500mg  twice a day. Needing new script sent to pharmacy.... 07/22/11@12 :12pm/LMB

## 2011-07-23 ENCOUNTER — Telehealth (INDEPENDENT_AMBULATORY_CARE_PROVIDER_SITE_OTHER): Payer: Self-pay | Admitting: General Surgery

## 2011-07-23 NOTE — Telephone Encounter (Signed)
I have left two message on unidentified VM to call me re: time changed for appt on Monday.  She was originally advised to check in at 2:00, but needs to come in at 2:45 for a 3:15 appt.

## 2011-07-27 ENCOUNTER — Encounter (INDEPENDENT_AMBULATORY_CARE_PROVIDER_SITE_OTHER): Payer: Self-pay | Admitting: Surgery

## 2011-07-27 ENCOUNTER — Ambulatory Visit (INDEPENDENT_AMBULATORY_CARE_PROVIDER_SITE_OTHER): Payer: BC Managed Care – PPO | Admitting: Surgery

## 2011-07-27 VITALS — BP 148/82 | HR 66 | Temp 97.1°F | Resp 20 | Ht 65.0 in | Wt 309.8 lb

## 2011-07-27 DIAGNOSIS — L02219 Cutaneous abscess of trunk, unspecified: Secondary | ICD-10-CM

## 2011-07-27 DIAGNOSIS — L02211 Cutaneous abscess of abdominal wall: Secondary | ICD-10-CM

## 2011-07-27 NOTE — Progress Notes (Signed)
Patient ID: Heather Myers, female   DOB: October 07, 1965, 46 y.o.   MRN: [id number]  Chief Complaint  Patient presents with  . Abscess    HPI Heather Myers is a 46 y.o. female.  Chronic abdominal wall abscess HPI This is a very pleasant female referred by Dr. Felicity Coyer for evaluation of a chronic abscess on her right lower quadrant abdominal wall. She has had this intermittently for several months. He was allowed and then again drained spontaneously. She has been on antibiotics for approximate 7 weeks. She denies fevers or chills. She is otherwise without complaints Past Medical History  Diagnosis Date  . Diabetes mellitus type 2 in obese   . Hypertension   . Depression   . Anxiety   . Hyperlipidemia   . Anemia   . Allergy   . Morbid obesity     Past Surgical History  Procedure Date  . Cholecystectomy   . Cesarean section     x's 2  . Rso     Family History  Problem Relation Age of Onset  . Diabetes Mother   . Diabetes Other     Social History History  Substance Use Topics  . Smoking status: Never Smoker   . Smokeless tobacco: Not on file   Comment: Married  . Alcohol Use: No    Allergies  Allergen Reactions  . Ramipril     Current Outpatient Prescriptions  Medication Sig Dispense Refill  . albuterol (PROAIR HFA) 108 (90 BASE) MCG/ACT inhaler Inhale 2 puffs into the lungs 4 (four) times daily.  18 g  2  . ALPRAZolam (XANAX) 0.25 MG tablet Take 1 tablet (0.25 mg total) by mouth 2 (two) times daily as needed for anxiety.  30 tablet  1  . BAYER BREEZE 2 TEST DISK TEST BLOOD GLUCOSE UP TO 6 TIMES PER DAY AS DIRECTED  200 each  2  . citalopram (CELEXA) 40 MG tablet Take 1 tablet (40 mg total) by mouth daily.  30 tablet  5  . DIOVAN 320 MG tablet TAKE 1 TABLET BY MOUTH ONCE DAILY  30 tablet  5  . Hydroquinone-Sunscreens (LUSTRA-ULTRA) 4 % CREA APPLY TO DARK AREAS ON SKIN TWICE DAILY  56.8 g  0  . insulin aspart (NOVOLOG PENFILL) 100 UNIT/ML injection  Inject 20 Units into the skin 3 (three) times daily before meals.  2 vial  2  . insulin glargine (LANTUS) 100 UNIT/ML injection Inject 80 Units into the skin at bedtime.  10 mL  6  . metFORMIN (GLUCOPHAGE) 500 MG tablet Take 2 tablets by mouth twice a day  120 tablet  5  . metoprolol (TOPROL-XL) 50 MG 24 hr tablet TAKE 1 TABLET BY MOUTH DAILY  30 tablet  5  . potassium chloride SA (K-DUR,KLOR-CON) 20 MEQ tablet Take 20 mEq by mouth daily.        Marland Kitchen sulfamethoxazole-trimethoprim (SEPTRA DS) 800-160 MG per tablet Take 1 tablet by mouth 2 (two) times daily.  14 tablet  0  . torsemide (DEMADEX) 20 MG tablet TAKE 1 TABLET BY MOUTH TWICE DAILY  60 tablet  5    Review of Systems Review of Systems  All other systems reviewed and are negative.    Blood pressure 148/82, pulse 66, temperature 97.1 F (36.2 C), temperature source Temporal, resp. rate 20, height 5\' 5"  (1.651 m), weight 309 lb 12.8 oz (140.524 kg).  Physical Exam Physical Exam  Constitutional: She appears well-developed and well-nourished. No distress.  HENT:  Head: Normocephalic and atraumatic.  Right Ear: External ear normal.  Left Ear: External ear normal.  Mouth/Throat: No oropharyngeal exudate.  Eyes: Conjunctivae are normal. Pupils are equal, round, and reactive to light. Right eye exhibits no discharge. Left eye exhibits no discharge. No scleral icterus.  Neck: Normal range of motion. Neck supple. No tracheal deviation present. No thyromegaly present.  Cardiovascular: Normal rate, regular rhythm, normal heart sounds and intact distal pulses.   No murmur heard. Pulmonary/Chest: Effort normal and breath sounds normal. No respiratory distress. She has no wheezes.  Abdominal: Soft. Bowel sounds are normal. She exhibits no distension. There is no tenderness. There is no rebound.       There is a 2 cm chronic open wound on her right lower quadrant.There is minimal induration and no erythema or purulence  Lymphadenopathy:    She  has no cervical adenopathy.  Skin: Skin is warm and dry. No rash noted. No erythema.  Psychiatric: Her behavior is normal. Judgment normal.    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 I discussed this with her and her family in detail. I discussed the risks of surgery which includes but is not limited to bleeding, infection, need for further surgery, having a chronic open wound again, et Karie Soda. She understands and wishes to proceed. Surgery will be scheduled. Likelihood of success is good       Jaryd Drew A 07/27/2011, 3:22 PM

## 2011-07-29 ENCOUNTER — Encounter (HOSPITAL_BASED_OUTPATIENT_CLINIC_OR_DEPARTMENT_OTHER): Payer: Self-pay | Admitting: *Deleted

## 2011-07-29 NOTE — Progress Notes (Signed)
To come in for [id number]  

## 2011-07-31 ENCOUNTER — Encounter (HOSPITAL_BASED_OUTPATIENT_CLINIC_OR_DEPARTMENT_OTHER)
Admission: RE | Admit: 2011-07-31 | Discharge: 2011-07-31 | Disposition: A | Discharge status: Home / Self Care | Payer: BC Managed Care – PPO | Source: Ambulatory Visit | Attending: Surgery | Admitting: Surgery

## 2011-07-31 ENCOUNTER — Other Ambulatory Visit: Payer: Self-pay

## 2011-07-31 LAB — BASIC METABOLIC PANEL
BUN: 9 mg/dL (ref 6–23)
CO2: 25 mEq/L (ref 19–32)
Chloride: 98 mEq/L (ref 96–112)
GFR calc Af Amer: >90 mL/min (ref >90)
Potassium: 3.7 mEq/L (ref 3.5–5.1)

## 2011-08-02 ENCOUNTER — Other Ambulatory Visit: Payer: Self-pay | Admitting: Internal Medicine

## 2011-08-03 ENCOUNTER — Encounter (HOSPITAL_BASED_OUTPATIENT_CLINIC_OR_DEPARTMENT_OTHER): Payer: Self-pay | Admitting: Anesthesiology

## 2011-08-03 ENCOUNTER — Encounter (HOSPITAL_BASED_OUTPATIENT_CLINIC_OR_DEPARTMENT_OTHER): Payer: Self-pay

## 2011-08-03 ENCOUNTER — Ambulatory Visit (HOSPITAL_BASED_OUTPATIENT_CLINIC_OR_DEPARTMENT_OTHER): Payer: BC Managed Care – PPO | Admitting: Anesthesiology

## 2011-08-03 ENCOUNTER — Ambulatory Visit (HOSPITAL_BASED_OUTPATIENT_CLINIC_OR_DEPARTMENT_OTHER)
Admission: RE | Admit: 2011-08-03 | Discharge: 2011-08-03 | Disposition: A | Discharge status: Home / Self Care | Payer: BC Managed Care – PPO | Source: Ambulatory Visit | Attending: Surgery | Admitting: Surgery

## 2011-08-03 ENCOUNTER — Encounter (HOSPITAL_BASED_OUTPATIENT_CLINIC_OR_DEPARTMENT_OTHER)
Admission: RE | Disposition: A | Discharge status: Home / Self Care | Payer: Self-pay | Source: Ambulatory Visit | Attending: Surgery

## 2011-08-03 DIAGNOSIS — L03319 Cellulitis of trunk, unspecified: Secondary | ICD-10-CM

## 2011-08-03 DIAGNOSIS — I10 Essential (primary) hypertension: Secondary | ICD-10-CM | POA: Insufficient documentation

## 2011-08-03 DIAGNOSIS — J45909 Unspecified asthma, uncomplicated: Secondary | ICD-10-CM | POA: Insufficient documentation

## 2011-08-03 DIAGNOSIS — Z01810 Encounter for preprocedural cardiovascular examination: Secondary | ICD-10-CM | POA: Insufficient documentation

## 2011-08-03 DIAGNOSIS — F411 Generalized anxiety disorder: Secondary | ICD-10-CM | POA: Insufficient documentation

## 2011-08-03 DIAGNOSIS — E119 Type 2 diabetes mellitus without complications: Secondary | ICD-10-CM | POA: Insufficient documentation

## 2011-08-03 DIAGNOSIS — K219 Gastro-esophageal reflux disease without esophagitis: Secondary | ICD-10-CM | POA: Insufficient documentation

## 2011-08-03 DIAGNOSIS — F3289 Other specified depressive episodes: Secondary | ICD-10-CM | POA: Insufficient documentation

## 2011-08-03 DIAGNOSIS — F329 Major depressive disorder, single episode, unspecified: Secondary | ICD-10-CM | POA: Insufficient documentation

## 2011-08-03 DIAGNOSIS — L02219 Cutaneous abscess of trunk, unspecified: Secondary | ICD-10-CM

## 2011-08-03 DIAGNOSIS — Z794 Long term (current) use of insulin: Secondary | ICD-10-CM | POA: Insufficient documentation

## 2011-08-03 HISTORY — PX: LIPOMA EXCISION: SHX5283

## 2011-08-03 LAB — GLUCOSE, CAPILLARY: Glucose-Capillary: 164 mg/dL — ABNORMAL HIGH (ref 70–99)

## 2011-08-03 SURGERY — EXCISION LIPOMA
Anesthesia: Monitor Anesthesia Care | Site: Abdomen | Laterality: Right | Wound class: Clean Contaminated

## 2011-08-03 MED ORDER — LACTATED RINGERS IV SOLN
INTRAVENOUS | Status: DC
  Administered 2011-08-03 (×2): via INTRAVENOUS

## 2011-08-03 MED ORDER — LIDOCAINE HCL (CARDIAC) 20 MG/ML IV SOLN
INTRAVENOUS | Status: DC | PRN
  Administered 2011-08-03: 30 mg via INTRAVENOUS

## 2011-08-03 MED ORDER — CEFAZOLIN SODIUM-DEXTROSE 2-3 GM-% IV SOLR
2.0000 g | INTRAVENOUS | Status: AC
  Administered 2011-08-03: 2 g via INTRAVENOUS

## 2011-08-03 MED ORDER — FENTANYL CITRATE 0.05 MG/ML IJ SOLN
INTRAMUSCULAR | Status: DC | PRN
  Administered 2011-08-03: 50 ug via INTRAVENOUS

## 2011-08-03 MED ORDER — SODIUM CHLORIDE 0.9 % IV SOLN: 250.0000 mL | INTRAVENOUS | Status: DC | PRN

## 2011-08-03 MED ORDER — MIDAZOLAM HCL 5 MG/5ML IJ SOLN
INTRAMUSCULAR | Status: DC | PRN
  Administered 2011-08-03: 2 mg via INTRAVENOUS

## 2011-08-03 MED ORDER — SODIUM CHLORIDE 0.9 % IJ SOLN: 3.0000 mL | INTRAMUSCULAR | Status: DC | PRN

## 2011-08-03 MED ORDER — ACETAMINOPHEN 650 MG RE SUPP: 650.0000 mg | RECTAL | Status: DC | PRN

## 2011-08-03 MED ORDER — DOXYCYCLINE HYCLATE 100 MG PO TABS: 100.0000 mg | ORAL_TABLET | Freq: Every day | ORAL | Status: AC

## 2011-08-03 MED ORDER — LIDOCAINE HCL (PF) 1 % IJ SOLN
INTRAMUSCULAR | Status: DC | PRN
  Administered 2011-08-03: 20 mL

## 2011-08-03 MED ORDER — ACETAMINOPHEN 325 MG PO TABS: 650.0000 mg | ORAL_TABLET | ORAL | Status: DC | PRN

## 2011-08-03 MED ORDER — OXYCODONE HCL 5 MG PO TABS
5.0000 mg | ORAL_TABLET | ORAL | Status: DC | PRN
  Administered 2011-08-03: 5 mg via ORAL

## 2011-08-03 MED ORDER — MORPHINE SULFATE 2 MG/ML IJ SOLN: 2.0000 mg | INTRAMUSCULAR | Status: DC | PRN

## 2011-08-03 MED ORDER — OXYCODONE-ACETAMINOPHEN 5-325 MG PO TABS: ORAL_TABLET | ORAL | Status: AC

## 2011-08-03 MED ORDER — PROPOFOL 10 MG/ML IV EMUL
INTRAVENOUS | Status: DC | PRN
  Administered 2011-08-03: 100 ug/kg/min via INTRAVENOUS

## 2011-08-03 MED ORDER — SODIUM CHLORIDE 0.9 % IJ SOLN: 3.0000 mL | Freq: Two times a day (BID) | INTRAMUSCULAR | Status: DC

## 2011-08-03 MED ORDER — ONDANSETRON HCL 4 MG/2ML IJ SOLN: 4.0000 mg | Freq: Four times a day (QID) | INTRAMUSCULAR | Status: DC | PRN

## 2011-08-03 MED ORDER — FENTANYL CITRATE 0.05 MG/ML IJ SOLN: 25.0000 ug | INTRAMUSCULAR | Status: DC | PRN

## 2011-08-03 SURGICAL SUPPLY — 47 items
0.5% BUPIVACAINE WITH EPI 1:200,000 IMPLANT
APL SKNCLS STERI-STRIP NONHPOA (GAUZE/BANDAGES/DRESSINGS)
BENZOIN TINCTURE PRP APPL 2/3 (GAUZE/BANDAGES/DRESSINGS) ×1 IMPLANT
BLADE HEX COATED 2.75 (ELECTRODE) ×2 IMPLANT
BLADE SURG 15 STRL LF DISP TIS (BLADE) ×1 IMPLANT
BLADE SURG 15 STRL SS (BLADE) ×2
BLADE SURG ROTATE 9660 (MISCELLANEOUS) IMPLANT
CANISTER SUCTION 1200CC (MISCELLANEOUS) IMPLANT
CHLORAPREP W/TINT 26ML (MISCELLANEOUS) ×2 IMPLANT
CLOTH BEACON ORANGE TIMEOUT ST (SAFETY) ×2 IMPLANT
COVER MAYO STAND STRL (DRAPES) ×2 IMPLANT
COVER TABLE BACK 60X90 (DRAPES) ×2 IMPLANT
DECANTER SPIKE VIAL GLASS SM (MISCELLANEOUS) IMPLANT
DRAPE PED LAPAROTOMY (DRAPES) ×2 IMPLANT
DRAPE UTILITY XL STRL (DRAPES) ×2 IMPLANT
DRSG TEGADERM 4X4.75 (GAUZE/BANDAGES/DRESSINGS) ×1 IMPLANT
ELECT REM PT RETURN 9FT ADLT (ELECTROSURGICAL) ×2
ELECTRODE REM PT RTRN 9FT ADLT (ELECTROSURGICAL) ×1 IMPLANT
GAUZE SPONGE 4X4 12PLY STRL LF (GAUZE/BANDAGES/DRESSINGS) ×1 IMPLANT
GLOVE BIO SURGEON STRL SZ 6.5 (GLOVE) ×1 IMPLANT
GLOVE SURG SIGNA 7.5 PF LTX (GLOVE) ×2 IMPLANT
GOWN PREVENTION PLUS XLARGE (GOWN DISPOSABLE) ×2 IMPLANT
GOWN PREVENTION PLUS XXLARGE (GOWN DISPOSABLE) ×2 IMPLANT
NDL HYPO 25X1 1.5 SAFETY (NEEDLE) ×1 IMPLANT
NEEDLE HYPO 25X1 1.5 SAFETY (NEEDLE) ×2 IMPLANT
NEUT SODIUM BICARBINATE 4% IMPLANT
NS IRRIG 1000ML POUR BTL (IV SOLUTION) ×1 IMPLANT
PACK BASIN DAY SURGERY FS (CUSTOM PROCEDURE TRAY) ×2 IMPLANT
PENCIL BUTTON HOLSTER BLD 10FT (ELECTRODE) ×2 IMPLANT
SLEEVE SCD COMPRESS KNEE MED (MISCELLANEOUS) IMPLANT
SPONGE GAUZE 4X4 12PLY (GAUZE/BANDAGES/DRESSINGS) ×1 IMPLANT
SPONGE LAP 4X18 X RAY DECT (DISPOSABLE) ×2 IMPLANT
STRIP CLOSURE SKIN 1/2X4 (GAUZE/BANDAGES/DRESSINGS) ×1 IMPLANT
SUT ETHILON 2 0 FS 18 (SUTURE) ×2 IMPLANT
SUT MNCRL AB 4-0 PS2 18 (SUTURE) IMPLANT
SUT VIC AB 2-0 SH 27 (SUTURE)
SUT VIC AB 2-0 SH 27XBRD (SUTURE) IMPLANT
SUT VIC AB 3-0 SH 27 (SUTURE)
SUT VIC AB 3-0 SH 27X BRD (SUTURE) IMPLANT
SYR BULB 3OZ (MISCELLANEOUS) ×1 IMPLANT
SYR CONTROL 10ML LL (SYRINGE) ×2 IMPLANT
TAPE CLOTH SURG 4X10 WHT LF (GAUZE/BANDAGES/DRESSINGS) ×1 IMPLANT
TOWEL OR 17X24 6PK STRL BLUE (TOWEL DISPOSABLE) ×2 IMPLANT
TOWEL OR NON WOVEN STRL DISP B (DISPOSABLE) ×2 IMPLANT
TUBE CONNECTING 20X1/4 (TUBING) IMPLANT
WATER STERILE IRR 1000ML POUR (IV SOLUTION) ×1 IMPLANT
YANKAUER SUCT BULB TIP NO VENT (SUCTIONS) IMPLANT

## 2011-08-03 NOTE — Transfer of Care (Signed)
Immediate Anesthesia Transfer of Care Note  Patient: Heather Myers  Procedure(s) Performed: Procedure(s) (LRB): EXCISION LIPOMA (Right)  Patient Location: PACU  Anesthesia Type: MAC  Level of Consciousness: awake and oriented  Airway & Oxygen Therapy: Patient Spontanous Breathing and Patient connected to face mask oxygen  Post-op Assessment: Report given to PACU RN and Post -op Vital signs reviewed and stable  Post vital signs: Reviewed and stable  Complications: No apparent anesthesia complications

## 2011-08-03 NOTE — Discharge Instructions (Signed)
May remove bandage and shower starting tomorrow  Ice pack as needed   Post Anesthesia Home Care Instructions  Activity: Get plenty of rest for the remainder of the day. A responsible adult should stay with you for 24 hours following the procedure.  For the next 24 hours, DO NOT: -Drive a car -Advertising copywriter -Drink alcoholic beverages -Take any medication unless instructed by your physician -Make any legal decisions or sign important papers.  Meals: Start with liquid foods such as gelatin or soup. Progress to regular foods as tolerated. Avoid greasy, spicy, heavy foods. If nausea and/or vomiting occur, drink only clear liquids until the nausea and/or vomiting subsides. Call your physician if vomiting continues.  Special Instructions/Symptoms: Your throat may feel dry or sore from the anesthesia or the breathing tube placed in your throat during surgery. If this causes discomfort, gargle with warm salt water. The discomfort should disappear within 24 hours.

## 2011-08-03 NOTE — Op Note (Signed)
EXCISION LIPOMA  Procedure Note  Heather Myers 08/03/2011   Pre-op Diagnosis:  excision chronic abscess Right lower abdominal wall     Post-op Diagnosis: same  Procedure(s): 6 CM WIDE EXCISION CHRONIC RIGHT LOWER QUADRANT ABDOMINAL WALL ABSCESS  Surgeon(s): Shelly Rubenstein, MD  Anesthesia: Monitor Anesthesia Care  Staff:  Nolon Nations Ward, RN - Relief Circulator Salley Scarlet, RN - Relief Scrub Venia Minks Arledge, RN - Circulator Flor M Neiers, CST - Scrub Person  Estimated Blood Loss: Minimal                       Procedure: The patient was brought to the operating room and identified the correct patient. She was placed supine on the operating room table and general anesthesia was induced. A right lower quadrant was then prepped and draped in the usual sterile fashion. I anesthetized the skin around the chronic abscess with lidocaine. I then made an elliptical incision with the scalpel. I did this down to the subcutaneous tissue with the electric cautery. This was a 6 cm wide excision of chronic abdominal wall abscess. I completely excised the abscess and chronic drainage and tissue with the cautery and sent to pathology for evaluation. Hemostasis was then achieved with cautery. I irrigated the wound thoroughly with normal saline. I then closed the skin with interrupted 2-0 nylon sutures. Gauze and tape were applied. The patient tolerated the procedure well. All the counts were correct at the end of the procedure. The patient was then taken in a stable condition from the operating room to the recovery room.  Jolon Degante A   Date: 08/03/2011  Time: 1:44 PM

## 2011-08-03 NOTE — Anesthesia Postprocedure Evaluation (Signed)
  Anesthesia Post-op Note  Patient: Heather Myers  Procedure(s) Performed: Procedure(s) (LRB): EXCISION LIPOMA (Right)  Patient Location: PACU  Anesthesia Type: MAC  Level of Consciousness: awake, alert  and oriented  Airway and Oxygen Therapy: Patient Spontanous Breathing  Post-op Pain: none  Post-op Assessment: Post-op Vital signs reviewed, Patient's Cardiovascular Status Stable, Respiratory Function Stable, Patent Airway, No signs of Nausea or vomiting and Pain level controlled  Post-op Vital Signs: Reviewed and stable  Complications: No apparent anesthesia complications

## 2011-08-03 NOTE — Interval H&P Note (Signed)
History and Physical Interval Note:  She has had no change in her history or exam  08/03/2011 11:08 AM  Norma Fredrickson  has presented today for surgery, with the diagnosis of wide excision chronic abscess Right lower abdominal wall  The various methods of treatment have been discussed with the patient and family. After consideration of risks, benefits and other options for treatment, the patient has consented to  Procedure(s) (LRB): EXCISION LIPOMA (Right) as a surgical intervention .  The patients' history has been reviewed, patient examined, no change in status, stable for surgery.  I have reviewed the patients' chart and labs.  Questions were answered to the patient's satisfaction.     Kris Burd A

## 2011-08-03 NOTE — H&P (View-Only) (Signed)
Patient ID: Heather Myers, female   DOB: 11/10/1965, 46 y.o.   MRN: [id number]  Chief Complaint  Patient presents with  . Abscess    HPI Heather Myers is a 46 y.o. female.  Chronic abdominal wall abscess HPI This is a very pleasant female referred by Dr. Leschber for evaluation of a chronic abscess on her right lower quadrant abdominal wall. She has had this intermittently for several months. He was allowed and then again drained spontaneously. She has been on antibiotics for approximate 7 weeks. She denies fevers or chills. She is otherwise without complaints Past Medical History  Diagnosis Date  . Diabetes mellitus type 2 in obese   . Hypertension   . Depression   . Anxiety   . Hyperlipidemia   . Anemia   . Allergy   . Morbid obesity     Past Surgical History  Procedure Date  . Cholecystectomy   . Cesarean section     x's 2  . Rso     Family History  Problem Relation Age of Onset  . Diabetes Mother   . Diabetes Other     Social History History  Substance Use Topics  . Smoking status: Never Smoker   . Smokeless tobacco: Not on file   Comment: Married  . Alcohol Use: No    Allergies  Allergen Reactions  . Ramipril     Current Outpatient Prescriptions  Medication Sig Dispense Refill  . albuterol (PROAIR HFA) 108 (90 BASE) MCG/ACT inhaler Inhale 2 puffs into the lungs 4 (four) times daily.  18 g  2  . ALPRAZolam (XANAX) 0.25 MG tablet Take 1 tablet (0.25 mg total) by mouth 2 (two) times daily as needed for anxiety.  30 tablet  1  . BAYER BREEZE 2 TEST DISK TEST BLOOD GLUCOSE UP TO 6 TIMES PER DAY AS DIRECTED  200 each  2  . citalopram (CELEXA) 40 MG tablet Take 1 tablet (40 mg total) by mouth daily.  30 tablet  5  . DIOVAN 320 MG tablet TAKE 1 TABLET BY MOUTH ONCE DAILY  30 tablet  5  . Hydroquinone-Sunscreens (LUSTRA-ULTRA) 4 % CREA APPLY TO DARK AREAS ON SKIN TWICE DAILY  56.8 g  0  . insulin aspart (NOVOLOG PENFILL) 100 UNIT/ML injection  Inject 20 Units into the skin 3 (three) times daily before meals.  2 vial  2  . insulin glargine (LANTUS) 100 UNIT/ML injection Inject 80 Units into the skin at bedtime.  10 mL  6  . metFORMIN (GLUCOPHAGE) 500 MG tablet Take 2 tablets by mouth twice a day  120 tablet  5  . metoprolol (TOPROL-XL) 50 MG 24 hr tablet TAKE 1 TABLET BY MOUTH DAILY  30 tablet  5  . potassium chloride SA (K-DUR,KLOR-CON) 20 MEQ tablet Take 20 mEq by mouth daily.        . sulfamethoxazole-trimethoprim (SEPTRA DS) 800-160 MG per tablet Take 1 tablet by mouth 2 (two) times daily.  14 tablet  0  . torsemide (DEMADEX) 20 MG tablet TAKE 1 TABLET BY MOUTH TWICE DAILY  60 tablet  5    Review of Systems Review of Systems  All other systems reviewed and are negative.    Blood pressure 148/82, pulse 66, temperature 97.1 F (36.2 C), temperature source Temporal, resp. rate 20, height 5' 5" (1.651 m), weight 309 lb 12.8 oz (140.524 kg).  Physical Exam Physical Exam  Constitutional: She appears well-developed and well-nourished. No distress.  HENT:    Head: Normocephalic and atraumatic.  Right Ear: External ear normal.  Left Ear: External ear normal.  Mouth/Throat: No oropharyngeal exudate.  Eyes: Conjunctivae are normal. Pupils are equal, round, and reactive to light. Right eye exhibits no discharge. Left eye exhibits no discharge. No scleral icterus.  Neck: Normal range of motion. Neck supple. No tracheal deviation present. No thyromegaly present.  Cardiovascular: Normal rate, regular rhythm, normal heart sounds and intact distal pulses.   No murmur heard. Pulmonary/Chest: Effort normal and breath sounds normal. No respiratory distress. She has no wheezes.  Abdominal: Soft. Bowel sounds are normal. She exhibits no distension. There is no tenderness. There is no rebound.       There is a 2 cm chronic open wound on her right lower quadrant.There is minimal induration and no erythema or purulence  Lymphadenopathy:    She  has no cervical adenopathy.  Skin: Skin is warm and dry. No rash noted. No erythema.  Psychiatric: Her behavior is normal. Judgment normal.    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 I discussed this with her and her family in detail. I discussed the risks of surgery which includes but is not limited to bleeding, infection, need for further surgery, having a chronic open wound again, et cetera. She understands and wishes to proceed. Surgery will be scheduled. Likelihood of success is good       Heather Myers A 07/27/2011, 3:22 PM    

## 2011-08-03 NOTE — Anesthesia Preprocedure Evaluation (Addendum)
Anesthesia Evaluation  Patient identified by MRN, date of birth, ID band Patient awake    Reviewed: Allergy & Precautions, H&P , NPO status , Patient's Chart, lab work & pertinent test results, reviewed documented beta blocker date and time   History of Anesthesia Complications (+) PROLONGED EMERGENCE  Airway Mallampati: II TM Distance: >3 FB Neck ROM: Full    Dental No notable dental hx. (+) Teeth Intact and Dental Advisory Given   Pulmonary asthma (used albuterol today) ,  breath sounds clear to auscultation  Pulmonary exam normal       Cardiovascular hypertension, Pt. on medications and Pt. on home beta blockers Rhythm:Regular Rate:Normal  ECHO 4/12: normal LVF, EF 55-65%, normal valves   Neuro/Psych PSYCHIATRIC DISORDERS Anxiety Depression negative neurological ROS     GI/Hepatic Neg liver ROS, GERD-  Controlled,  Endo/Other  Diabetes mellitus- (glu 164), Type 2, Insulin Dependent and Oral Hypoglycemic AgentsMorbid obesity  Renal/GU negative Renal ROS     Musculoskeletal   Abdominal (+) + obese,   Peds  Hematology negative hematology ROS (+)   Anesthesia Other Findings   Reproductive/Obstetrics LMP last week                          Anesthesia Physical Anesthesia Plan  ASA: III  Anesthesia Plan: MAC   Post-op Pain Management:    Induction:   Airway Management Planned: Simple Face Mask  Additional Equipment:   Intra-op Plan:   Post-operative Plan:   Informed Consent: I have reviewed the patients History and Physical, chart, labs and discussed the procedure including the risks, benefits and alternatives for the proposed anesthesia with the patient or authorized representative who has indicated his/her understanding and acceptance.   Dental advisory given  Plan Discussed with: CRNA and Surgeon  Anesthesia Plan Comments: (Plan routine monitors, MAC)        Anesthesia  Quick Evaluation

## 2011-08-04 ENCOUNTER — Encounter (HOSPITAL_BASED_OUTPATIENT_CLINIC_OR_DEPARTMENT_OTHER): Payer: Self-pay | Admitting: Surgery

## 2011-08-10 ENCOUNTER — Encounter (INDEPENDENT_AMBULATORY_CARE_PROVIDER_SITE_OTHER): Payer: Self-pay | Admitting: Surgery

## 2011-08-10 ENCOUNTER — Ambulatory Visit (INDEPENDENT_AMBULATORY_CARE_PROVIDER_SITE_OTHER): Payer: BC Managed Care – PPO | Admitting: Surgery

## 2011-08-10 VITALS — BP 138/82 | HR 84 | Temp 97.6°F | Resp 16 | Ht 65.0 in | Wt 315.0 lb

## 2011-08-10 DIAGNOSIS — Z09 Encounter for follow-up examination after completed treatment for conditions other than malignant neoplasm: Secondary | ICD-10-CM

## 2011-08-10 NOTE — Progress Notes (Signed)
Subjective:     Patient ID: Heather Myers, female   DOB: [birth year], 46 y.o.   MRN: [id number]  HPI She is here for a postop visit status post excision of a chronic abdominal wound. She is doing well and has no complaints  Review of Systems     Objective:   Physical Exam The incision is healing well. I removed the sutures and placed Steri-Strips. The pathology showed chronic inflamed tissue    Assessment:     Patient status post excision of chronic abdominal wall    Plan:     I will see her back as needed. She may return to normal

## 2011-08-11 ENCOUNTER — Encounter (HOSPITAL_COMMUNITY): Payer: Self-pay

## 2011-08-11 ENCOUNTER — Emergency Department (HOSPITAL_COMMUNITY)
Admission: EM | Admit: 2011-08-11 | Discharge: 2011-08-11 | Disposition: A | Discharge status: Home / Self Care | Payer: BC Managed Care – PPO | Attending: Emergency Medicine | Admitting: Emergency Medicine

## 2011-08-11 DIAGNOSIS — Z5189 Encounter for other specified aftercare: Secondary | ICD-10-CM

## 2011-08-11 DIAGNOSIS — I10 Essential (primary) hypertension: Secondary | ICD-10-CM | POA: Insufficient documentation

## 2011-08-11 DIAGNOSIS — T8130XA Disruption of wound, unspecified, initial encounter: Secondary | ICD-10-CM | POA: Insufficient documentation

## 2011-08-11 DIAGNOSIS — E785 Hyperlipidemia, unspecified: Secondary | ICD-10-CM | POA: Insufficient documentation

## 2011-08-11 DIAGNOSIS — Y839 Surgical procedure, unspecified as the cause of abnormal reaction of the patient, or of later complication, without mention of misadventure at the time of the procedure: Secondary | ICD-10-CM | POA: Insufficient documentation

## 2011-08-11 DIAGNOSIS — E119 Type 2 diabetes mellitus without complications: Secondary | ICD-10-CM | POA: Insufficient documentation

## 2011-08-11 DIAGNOSIS — D649 Anemia, unspecified: Secondary | ICD-10-CM | POA: Insufficient documentation

## 2011-08-11 NOTE — Discharge Instructions (Signed)
Wound Debridement A wound must be clean to heal. It also must get a good supply of blood. Anything that is stopping this must be taken out of the wound. This could be dead tissue, scar tissue, or a collection of fluid (pus). Something from outside the body also might have gotten into the wound. These items are removed in a procedure called debridement. Wounds that are not cleaned by debridement heal slowly or not at all. They can become infected. The infection can also spread to nearby areas or to other parts of the body through the blood. Debridement is sometimes done to get a sample of tissue from the wound. The tissue can be checked under a microscope or sent to a lab for testing. LET YOUR CAREGIVER KNOW ABOUT:   Allergies to food or medicine.   Medicines taken, including vitamins, herbs, eyedrops, over-the-counter medicines, and creams.   Use of steroids (by mouth or creams).   Previous problems with anesthetics or numbing medicines.   History of bleeding problems or blood clots.   Previous surgery.   Other health problems, including diabetes and kidney problems.   Possibility of pregnancy, if this applies.  RISKS AND COMPLICATIONS  Problems from debridement are rare. If they do occur, they are usually not serious. Risks could include:  Bleeding that does not stop.   Infection.   Damage to healthy tissue inside the wound. This could include blood vessels or nerves.   Pain.   Lack of healing. This could mean another debridement is needed.  BEFORE THE PROCEDURE   The wound will be checked for signs of healing or infection. This may include:   Blood tests to check for infection.   Measuring the wound. This includes how deep it is. A metal tool (probe) may be used.   You must give informed consent. This requires signing a legal paper that gives permission for the procedure.   You may need to speak with the person who would give you anesthesia during the procedure  (anesthesiologist). Sometimes anesthesia is not needed. If anesthesia is used, the area around the wound may be numbed (local anesthesia). If the wound is deep or wide, you may be put to sleep during the surgery (general anesthesia). Ask your caregiver what to expect.   If you are having general anesthesia, do not eat or drink anything for at least 6 hours before the procedure. Ask if it is okay to take needed medicine with a sip of water.   This might be an outpatient procedure, meaning you will go home the same day. Make arrangements ahead of time for someone to drive you home. Also, make sure someone can stay with you for a few days.   On the day of the debridement, your caregiver will need to know the last time you had anything to eat or drink. This includes water, gum, and candy.  PROCEDURE   Small monitors may be placed on your body. They are used to check your heart, blood pressure, and oxygen level.   You may be given medicine through an intravenous (IV) access tube.   You might be given a medicine to help you relax (sedative).   You may be given general anesthesia to help you sleep or local anesthesia to numb the area around the wound.  Surgical debridement:  Once you are asleep or numb, the wound may be washed with a sterile saltwater solution.   Scissors, surgical knives (scalpels), and surgical tweezers (forceps) will be used  to remove dead or dying tissue. Any other material that should not be in the wound will also be taken out.   After the wound is clean, it will be washed again.   A bandage (dressing) may be placed over the wound.  Sometimes, other methods are used. These methods might be done along with surgical debridement or instead of it. They include: Mechanical debridement:  A moist dressing is placed over the wound. It is left in place until it is dry. The dressing is lifted off. This lifts away any dead tissue.   Sometimes, whirlpool baths are used.    Sometimes, pulsed lavage is done. This involves flushing the wound with sterile solution under pressure.  Chemical debridement:  A chemical medicine is put on the wound. The aim is to dissolve dead or dying tissue. Ointments may also be used.  Autolytic debridement:  A special dressing is used to trap moisture inside the wound. The goal is for the wound to heal naturally under the dressing. Healing takes longer with this treatment.  AFTER THE PROCEDURE   You will stay in a recovery area until the anesthesia has worn off. Your blood pressure and pulse will be checked periodically. You may continue to get fluids through the IV for awhile. Once you have recovered, you should be able to go home.   Some pain after debridement is normal. You will probably be given pain medicine.   Before you go home, make sure you know how to care for the wound. This includes knowing when the dressing should be changed and how to change it.   Set a follow-up appointment before leaving.  HOME CARE INSTRUCTIONS   Take all medicine that has been prescribed. Follow the directions carefully. Do not take over-the-counter painkillers unless your caregiver says it is okay. Some of them can cause bleeding after surgical procedures.   Do not drive if you are taking narcotic pain medicine.   Follow all instructions about caring for your wound.   How active you can be will depend on your wound. Ask your caregiver whether there is anything you should or should not do while you heal.   Keep all follow-up appointments.  SEEK MEDICAL CARE IF:   You have any questions about medicines.   You have any questions about caring for your wound.   Pain continues, even after taking pain medicine.   You have an oral temperature above 102 F (38.9 C).  SEEK IMMEDIATE MEDICAL CARE IF:   Pain increases, even after taking pain medicine.   A bad smell comes from the wound.   The wound becomes red or swollen.   Blood or  other fluid is leaking from the wound.   The skin around the wound becomes white, blue, or black.   You have an oral temperature above 102 F (38.9 C), not controlled by medicine.  MAKE SURE YOU:   Understand these instructions.   Will watch your condition.   Will get help right away if you are not doing well or get worse.  Document Released: 07/08/2009 Document Revised: 04/02/2011 Document Reviewed: 07/08/2009 [facility] Patient Information 2012 [place], [place].

## 2011-08-11 NOTE — ED Notes (Signed)
Pt had a boil surgically removed from her stomach last Monday, yesterday she had the stitches removed and today she has lots of bleeding from the area, she called the on call surgeon who suggested and ice pack, but pt wanted to be seen because its more than just drainage, she also complains of pain at the area

## 2011-08-12 NOTE — ED Provider Notes (Signed)
History     CSN: [id number]  Arrival date & time 08/11/11  1850   First MD Initiated Contact with Patient 08/11/11 2135      Chief Complaint  Patient presents with  . Wound Check    (Consider location/radiation/quality/duration/timing/severity/associated sxs/prior treatment) Patient is a 46 y.o. female presenting with wound check. The history is provided by the patient. No language interpreter was used.  Wound Check  Treated in ED: was treated by surgery -  dr Rayburn Ma. this was performed about 8 days ago. had staples removed yesterday. Treatments since wound repair include oral antibiotics. There has been bloody discharge from the wound. There is no redness present. There is no swelling present. The pain has improved.    Past Medical History  Diagnosis Date  . Diabetes mellitus type 2 in obese   . Hypertension   . Depression   . Anxiety   . Hyperlipidemia   . Anemia   . Allergy   . Morbid obesity     Past Surgical History  Procedure Date  . Cholecystectomy   . Cesarean section     x's 2  . Rso   . 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 Diabetes Mother   . Diabetes Other     History  Substance Use Topics  . Smoking status: Never Smoker   . Smokeless tobacco: Not on file   Comment: Married  . Alcohol Use: No    OB History    Grav Para Term Preterm Abortions TAB SAB Ect Mult Living   3 2   1  1   2       Review of Systems  Constitutional: Negative for fever, chills, activity change, appetite change and fatigue.  HENT: Negative for congestion, sore throat, rhinorrhea, neck pain and neck stiffness.   Respiratory: Negative for cough and shortness of breath.   Cardiovascular: Negative for chest pain and palpitations.    Gastrointestinal: Negative for nausea, vomiting and abdominal pain.  Genitourinary: Negative for dysuria, urgency, frequency and flank pain.  Musculoskeletal: Negative for myalgias, back pain and arthralgias.  Skin: Positive for wound. Negative for color change and rash.  Neurological: Negative for dizziness, weakness, light-headedness, numbness and headaches.  All other systems reviewed and are negative.    Allergies  Ramipril  Home Medications  Please see chart  BP 142/59  Pulse 98  Temp(Src) 98.6 F (37 C) (Oral)  Resp 24  Wt 312 lb 6 oz (141.692 kg)  SpO2 98%  LMP 07/27/2011  Physical Exam  Nursing note and vitals reviewed. Constitutional: She is oriented to person, place, and time. She appears well-developed and well-nourished. No distress.  HENT:  Head: Normocephalic and atraumatic.  Mouth/Throat: Oropharynx is clear and moist.  Eyes: Conjunctivae and EOM are normal. Pupils are equal, round, and reactive to light.  Neck: Normal range of motion. Neck supple.  Cardiovascular: Normal rate, regular rhythm, normal heart sounds and intact distal pulses.  Exam reveals no gallop and no friction rub.   No murmur heard. Pulmonary/Chest: Effort normal and breath sounds normal. No respiratory distress. She exhibits no tenderness.  Abdominal: Soft. Bowel sounds are normal. There is tenderness. There is no rebound and no guarding.       Mild amount  of localized tenderness around the incision. Likely a seroma. I wasn't able to express a significant amount of fluid. There is a small area of wound dehiscence. I applied Steri-Strips  Musculoskeletal: Normal range of motion. She exhibits no edema and no tenderness.  Neurological: She is alert and oriented to person, place, and time. No cranial nerve deficit.  Skin: Skin is warm and dry.    ED Course  Procedures (including critical care time)  Labs Reviewed - No data to display No results found.   1. Wound check, abscess        MDM  Wound check for a likely seroma. I applied Steri-Strips to the small area of wound dehiscence. Instructed to followup with Gen. surgery clinic. There is no indication for antibiotics. There is no evidence of cellulitis. She has pain medication at home. There is no indication for further testing.        Dayton Bailiff, MD 08/12/11 [phone]

## 2011-08-14 ENCOUNTER — Encounter (INDEPENDENT_AMBULATORY_CARE_PROVIDER_SITE_OTHER): Payer: Self-pay | Admitting: General Surgery

## 2011-08-14 ENCOUNTER — Ambulatory Visit (INDEPENDENT_AMBULATORY_CARE_PROVIDER_SITE_OTHER): Payer: BC Managed Care – PPO | Admitting: General Surgery

## 2011-08-14 VITALS — BP 136/72 | HR 99 | Temp 97.9°F | Ht 65.0 in | Wt 309.4 lb

## 2011-08-14 DIAGNOSIS — Z9889 Other specified postprocedural states: Secondary | ICD-10-CM

## 2011-08-14 NOTE — Patient Instructions (Signed)
Shower daily then clean the wound with betadine and apply a dry bulky dressing daily.

## 2011-08-14 NOTE — Progress Notes (Signed)
She presents to the urgent office today because she's been having some drainage from her right lower quadrant incision.  Monday she noticed a fair amount  of thin bloody drainage on her clothes. She went to the emergency department the next day and they put a Steri-Strip on the part of the wound had become separated. She is still having some drainage from the area. No fever or chills.  PE:  Abd- there is a right lower quadrant incision with a lateral aspect is status superficial separation and a small amount of serous drainage present. No erythema or purulence.  Assessment: Spontaneous drainage of wound seroma was superficial separation of the skin.  Plan: Shower daily. Cleaned with Betadine then we applied a bulky dressing. Return visit to Dr. Rosaura Carpenter in one week. Call if she see signs of infection

## 2011-08-25 ENCOUNTER — Ambulatory Visit: Payer: BC Managed Care – PPO | Admitting: Endocrinology

## 2011-08-26 ENCOUNTER — Ambulatory Visit (INDEPENDENT_AMBULATORY_CARE_PROVIDER_SITE_OTHER): Payer: BC Managed Care – PPO | Admitting: Surgery

## 2011-08-26 ENCOUNTER — Encounter (INDEPENDENT_AMBULATORY_CARE_PROVIDER_SITE_OTHER): Payer: Self-pay | Admitting: Surgery

## 2011-08-26 VITALS — BP 144/82 | HR 80 | Temp 96.0°F | Resp 16 | Ht 65.0 in | Wt 311.8 lb

## 2011-08-26 DIAGNOSIS — Z09 Encounter for follow-up examination after completed treatment for conditions other than malignant neoplasm: Secondary | ICD-10-CM

## 2011-08-26 NOTE — Progress Notes (Signed)
Subjective:     Patient ID: Heather Myers, female   DOB: [birth year], 46 y.o.   MRN: [id number]  HPI  She is here for followup visit. Her wound had broken down and had a small area draining. She reports a draining has decreased. She reports no purulence Review of Systems     Objective:   Physical Exam    On exam there is a small open area. Over this a Q-tip and treated with silver nitrate and then packed it with quarter-inch gauze. There is no erythema or purulence Assessment:     Open surgical wound    Plan:     She will start packing with quarter-inch gauze twice daily. I see her back in 2 weeks

## 2011-09-08 ENCOUNTER — Other Ambulatory Visit: Payer: Self-pay | Admitting: *Deleted

## 2011-09-08 NOTE — Telephone Encounter (Signed)
Received PA form completed and place on md desk for signature... 09/08/11@3 :21pm/LMB

## 2011-09-08 NOTE — Telephone Encounter (Signed)
Received fax pt needing PA on Diovan 320 mg take 1 tab daily. Contacted insurance faxing over PA form. Ref # [id number].Marland KitchenMarland Kitchen5/14/13@2 :54pm/LMB

## 2011-09-10 NOTE — Telephone Encounter (Signed)
Faxed Pa back to insurance waiting on approval status... 09/10/11@9 :16am/LMB

## 2011-09-11 MED ORDER — IRBESARTAN 300 MG PO TABS: 300.0000 mg | ORAL_TABLET | Freq: Every day | ORAL | Status: DC

## 2011-09-11 NOTE — Telephone Encounter (Signed)
Notified pt with med change will send new BP med to [facility]... 09/11/11@4 :44pm/LMB

## 2011-09-11 NOTE — Telephone Encounter (Signed)
change to Avapro as this is the formulary version of "Diovan" (same class)

## 2011-09-11 NOTE — Telephone Encounter (Signed)
Received Pa back med has been denied. Place form on md desk to review... 09/11/11@4 :33pm/lmb

## 2011-09-15 ENCOUNTER — Encounter (INDEPENDENT_AMBULATORY_CARE_PROVIDER_SITE_OTHER): Payer: BC Managed Care – PPO | Admitting: Surgery

## 2011-09-25 ENCOUNTER — Encounter (INDEPENDENT_AMBULATORY_CARE_PROVIDER_SITE_OTHER): Payer: BC Managed Care – PPO | Admitting: Surgery

## 2011-10-01 ENCOUNTER — Encounter (INDEPENDENT_AMBULATORY_CARE_PROVIDER_SITE_OTHER): Payer: Self-pay | Admitting: Surgery

## 2011-10-10 ENCOUNTER — Other Ambulatory Visit: Payer: Self-pay | Admitting: Internal Medicine

## 2011-10-19 ENCOUNTER — Other Ambulatory Visit: Payer: Self-pay | Admitting: *Deleted

## 2011-10-19 MED ORDER — METOPROLOL SUCCINATE ER 50 MG PO TB24: 50.0000 mg | ORAL_TABLET | Freq: Every day | ORAL | Status: DC

## 2011-11-09 ENCOUNTER — Encounter (HOSPITAL_COMMUNITY): Payer: Self-pay

## 2011-11-09 ENCOUNTER — Inpatient Hospital Stay (HOSPITAL_COMMUNITY)
Admission: AD | Admit: 2011-11-09 | Discharge: 2011-11-09 | Disposition: A | Discharge status: Home / Self Care | Payer: BC Managed Care – PPO | Source: Ambulatory Visit | Attending: Gynecology | Admitting: Gynecology

## 2011-11-09 DIAGNOSIS — N949 Unspecified condition associated with female genital organs and menstrual cycle: Secondary | ICD-10-CM | POA: Insufficient documentation

## 2011-11-09 DIAGNOSIS — N926 Irregular menstruation, unspecified: Secondary | ICD-10-CM

## 2011-11-09 DIAGNOSIS — N939 Abnormal uterine and vaginal bleeding, unspecified: Secondary | ICD-10-CM

## 2011-11-09 DIAGNOSIS — N938 Other specified abnormal uterine and vaginal bleeding: Secondary | ICD-10-CM | POA: Insufficient documentation

## 2011-11-09 LAB — POCT PREGNANCY, URINE: Preg Test, Ur: NEGATIVE

## 2011-11-09 NOTE — MAU Note (Signed)
About 2 hrs ago went to restroom, small spot of blood noted. About 30 minutes ago, went to the restoom and "it was like a faucet of blood", strings of blood clots. Period ended last Thurs, this is not normal for her, does not seem like start of period.

## 2011-11-09 NOTE — MAU Note (Signed)
Pt states bleeding began 2 hours ago, initial scant spotting, then 45 minutes ago was extremely heavy, with stringy and long clots. Bright red blood. Menstrual cycle just ended 5 days. R ovary removed 5 years ago r/t mass on ovary. Hx fibroids.

## 2011-11-09 NOTE — MAU Provider Note (Signed)
History     CSN: [id number]  Arrival date and time: 11/09/11 1825   First Provider Initiated Contact with Patient 11/09/11 1929      Chief Complaint  Patient presents with  . Vaginal Bleeding   HPI This is a 46 y.o. female who presents with c/o heavy bleeding episode this afternoon about 5pm. States finished her period 5 days ago and then spotted today, followed by a gush of heavy bleeding. Bleeding has subsided now. Had several myomectomy procedures in past. R Oopherectomy was done for benign mass. Has occasional menstrual irregularities, but was mostly concerned over the appearance of this blood,"its color and texture were so different".  Denies dizziness or weakness.   Pt states bleeding began 2 hours ago, initial scant spotting, then 45 minutes ago was extremely heavy, with stringy and long clots. Bright red blood. Menstrual cycle just ended 5 days. R ovary removed 5 years ago r/t mass on ovary. Hx fibroids.       OB History    Grav Para Term Preterm Abortions TAB SAB Ect Mult Living   3 2   1  1   2       Past Medical History  Diagnosis Date  . Diabetes mellitus type 2 in obese   . Hypertension   . Depression   . Anxiety   . Hyperlipidemia   . Anemia   . Allergy   . Morbid obesity   . Abdominal wall abscess     Past Surgical History  Procedure Date  . Cholecystectomy   . Cesarean section     x's 2  . Rso   . 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 Abdominal surgery     chronic abd wall abscess  . Oophorectomy     R ovary removed at [facility] History  Problem Relation Age of Onset  . Diabetes Mother   . Diabetes Other   . Other Neg Hx     History  Substance Use Topics  . Smoking status: Never Smoker   . Smokeless tobacco: Never Used   Comment: Married  .  Alcohol Use: No    Allergies:  Allergies  Allergen Reactions  . Ramipril Shortness Of Breath    No prescriptions prior to admission    ROS As listed in HPI  Physical Exam   Blood pressure 118/68, pulse 102, temperature 99.4 F (37.4 C), temperature source Oral, resp. rate 20, height 5\' 5"  (1.651 m), weight 309 lb (140.161 kg), last menstrual period 10/31/2011.  Physical Exam  Constitutional: She is oriented to person, place, and time. She appears well-developed and well-nourished. No distress (morbidly obese).  HENT:  Head: Normocephalic.  Cardiovascular: Normal rate.   Respiratory: Effort normal.  GI: Soft. She exhibits no distension and no mass. There is no tenderness. There is no rebound and no guarding.  Genitourinary: Uterus normal. Vaginal discharge (moderate blood in vault, small clot, cervix closed, uterus and adnexae difficult to palpate due to habitus) found.  Musculoskeletal: Normal range of motion.  Neurological: She is alert and oriented to person, place, and time.  Skin: Skin is warm and dry.  Psychiatric: She has a normal mood and affect.    MAU Course  Procedures  MDM Discussed with Dr Hyacinth Meeker. Will d/c home and have her call  Dr Lily Peer in the morning  Assessment and Plan  A:  Menomettrorhagia      No active hemorrhage      History of fibroids P:  Discharge home      Call Dr Lily Peer in am  [facility] 11/09/2011, 8:25 PM

## 2011-11-09 NOTE — MAU Note (Signed)
Hx of fibroids, mass on ovary- cancer scare, is very scared, states has just been a hard day.

## 2011-12-16 ENCOUNTER — Other Ambulatory Visit: Payer: Self-pay | Admitting: Internal Medicine

## 2011-12-23 ENCOUNTER — Other Ambulatory Visit: Payer: Self-pay | Admitting: Internal Medicine

## 2012-01-04 ENCOUNTER — Other Ambulatory Visit: Payer: Self-pay | Admitting: Internal Medicine

## 2012-01-18 ENCOUNTER — Telehealth: Payer: Self-pay | Admitting: Internal Medicine

## 2012-01-18 NOTE — Telephone Encounter (Signed)
Pt advised via personal VM 

## 2012-01-18 NOTE — Telephone Encounter (Signed)
Pt is welcome to go to urg care if needed, but no appts available for today

## 2012-01-18 NOTE — Telephone Encounter (Signed)
Caller: [name]/Patient; Patient Name: Heather Myers; PCP: Rene Paci (Adults only); Best Callback Phone Number: [phone] Pt had called for an appointment. She was told there are no appointments. Pt is weak/SOB with congestion and sinus pain. She has pain in her ear. Pt has been taking PCN x 10 days for a tooth infection/she had it removed - the ear that hurts is on the same side. Pt is scheduled tomorrow at 09:00. Pt is afeb. She is calling to nurse to say she wants an appt today if at all possible. Rn triaged per upper resp infection:disposition see in 24h but pt insistent office try to work her in today. Office note sent

## 2012-01-19 ENCOUNTER — Ambulatory Visit (INDEPENDENT_AMBULATORY_CARE_PROVIDER_SITE_OTHER): Payer: BC Managed Care – PPO | Admitting: Internal Medicine

## 2012-01-19 ENCOUNTER — Encounter: Payer: Self-pay | Admitting: Internal Medicine

## 2012-01-19 VITALS — BP 116/80 | HR 84 | Temp 98.1°F | Resp 16 | Wt 312.5 lb

## 2012-01-19 DIAGNOSIS — J309 Allergic rhinitis, unspecified: Secondary | ICD-10-CM

## 2012-01-19 DIAGNOSIS — J019 Acute sinusitis, unspecified: Secondary | ICD-10-CM

## 2012-01-19 DIAGNOSIS — E119 Type 2 diabetes mellitus without complications: Secondary | ICD-10-CM

## 2012-01-19 MED ORDER — ALBUTEROL SULFATE HFA 108 (90 BASE) MCG/ACT IN AERS: 2.0000 | INHALATION_SPRAY | Freq: Four times a day (QID) | RESPIRATORY_TRACT | Status: DC

## 2012-01-19 MED ORDER — PROMETHAZINE-PHENYLEPHRINE 6.25-5 MG/5ML PO SYRP: 5.0000 mL | ORAL_SOLUTION | ORAL | Status: DC | PRN

## 2012-01-19 MED ORDER — FLUTICASONE PROPIONATE 50 MCG/ACT NA SUSP: 2.0000 | Freq: Every day | NASAL | Status: DC

## 2012-01-19 MED ORDER — LEVOFLOXACIN 500 MG PO TABS: 500.0000 mg | ORAL_TABLET | Freq: Every day | ORAL | Status: DC

## 2012-01-19 NOTE — Patient Instructions (Signed)
It was good to see you today. antibiotics and prescription cough syrup - Your prescription(s) have been submitted to your pharmacy. Please take as directed and contact our office if you believe you are having problem(s) with the medication(s). Alternate between ibuprofen and tylenol for aches, pain and fever symptoms as discussed Hydrate, rest and call if worse or unimproved

## 2012-01-19 NOTE — Assessment & Plan Note (Signed)
The over-the-counter medications and nasal steroids

## 2012-01-19 NOTE — Progress Notes (Signed)
  Subjective:    Patient ID: Heather Myers, female    DOB: 1966/04/05, 46 y.o.   MRN: [id number]  HPI  Complains of facial swelling, pain and L ear discomfort Onset 10 days ago Precipitated by dental work and tooth removal -on penicillin>7 days,  Overlap with URI symptoms in past 3 days: Nasal congestion, sneezing, sore throat and increased asthma and allergy symptoms  Past Medical History  Diagnosis Date  . Diabetes mellitus type 2 in obese   . Hypertension   . Depression   . Anxiety   . Hyperlipidemia   . Anemia   . Allergic rhinitis   . Morbid obesity     Review of Systems  Constitutional: Positive for chills, appetite change and fatigue. Negative for unexpected weight change.  HENT: Positive for ear pain, congestion, facial swelling, dental problem, postnasal drip and sinus pressure. Negative for hearing loss, neck stiffness and ear discharge.   Respiratory: Positive for cough and shortness of breath. Negative for wheezing.   Cardiovascular: Negative for chest pain and leg swelling.  Neurological: Negative for weakness and headaches.       Objective:   Physical Exam BP 116/80  Pulse 84  Temp 98.1 F (36.7 C) (Oral)  Resp 16  Wt 312 lb 8 oz (141.749 kg)  SpO2 97% Wt Readings from Last 3 Encounters:  01/19/12 312 lb 8 oz (141.749 kg)  11/09/11 309 lb (140.161 kg)  08/26/11 311 lb 12.8 oz (141.432 kg)   Constitutional: She is overweight, but appears well-developed and well-nourished. No distress.  HENT: Head: Normocephalic and atraumatic. Ears: R TM ok, no erythema or effusion; left tympanic membrane hazy with clear effusion. Nose: Swollen turbinates, left or the right. Mild left maxillary tenderness to palpation Mouth/Throat: Oropharynx is red, but clear and moist. No oropharyngeal exudate.  dental extraction without evidence of persisting abscess or drainage Eyes: Conjunctivae and EOM are normal. Pupils are equal, round, and reactive to light. No scleral  icterus.  Neck: Normal range of motion. Neck supple. Mild cervical adenopathy. No JVD present. No thyromegaly present.  Cardiovascular: Normal rate, regular rhythm and normal heart sounds.  No murmur heard. No BLE edema. Pulmonary/Chest: Effort normal and breath sounds normal. No respiratory distress. She has no wheezes. Neurological: She is alert and oriented to person, place, and time. No cranial nerve deficit. Coordination normal.  Lab Results  Component Value Date   WBC 9.3 12/21/2008   HGB 11.0* 08/03/2011   HCT 39.0 12/21/2008   HCT 36.4 12/21/2008   PLT 486.0* 12/21/2008   GLUCOSE 189* 07/31/2011   CHOL 213* 09/10/2010   TRIG 214* 09/10/2010   HDL 43 09/10/2010   LDLCALC 127 09/10/2010   ALT 12 01/13/2007   AST 16 01/13/2007   NA 136 07/31/2011   K 3.7 07/31/2011   CL 98 07/31/2011   CREATININE 0.45* 07/31/2011   BUN 9 07/31/2011   CO2 25 07/31/2011   TSH 1.50 09/10/2010   HGBA1C 8.4* 07/21/2011   MICROALBUR 0.3 08/23/2006       Assessment & Plan:  Acute maxillary sinusitis, complicated by URI and recent dental work  Change antibiotics from penicillin to fluoroquinolone Add nasal steroid, patient request systemic steroids but I declined same today Symptomatic cough and congestion syrup Continue medications for allergic rhinitis and asthma, refills provided Reassurance regarding dental procedure, followup with dentist if persisting mouth problems

## 2012-01-19 NOTE — Assessment & Plan Note (Signed)
Variable compliance, complicated by obesity and insulin resistance On ARB, pt refuses statin -  Reminded of need for annual eye exam and follow up for a1c Lab Results  Component Value Date   HGBA1C 8.4* 07/21/2011

## 2012-01-27 ENCOUNTER — Other Ambulatory Visit: Payer: Self-pay | Admitting: Internal Medicine

## 2012-02-29 ENCOUNTER — Other Ambulatory Visit: Payer: Self-pay | Admitting: Internal Medicine

## 2012-03-10 ENCOUNTER — Telehealth: Payer: Self-pay | Admitting: *Deleted

## 2012-03-10 ENCOUNTER — Other Ambulatory Visit: Payer: Self-pay | Admitting: Internal Medicine

## 2012-03-10 ENCOUNTER — Other Ambulatory Visit: Payer: Self-pay | Admitting: *Deleted

## 2012-03-10 NOTE — Telephone Encounter (Signed)
try calling patient to see if she could make an appointmet for medication (Metoprolol) to be filled but, her voicemail box was full and could not receive no more messages

## 2012-03-10 NOTE — Telephone Encounter (Signed)
Pt needs appointment then refill can be made Fax Received. Refill Completed. Heather Myers (R.M.A)   

## 2012-04-01 ENCOUNTER — Other Ambulatory Visit: Payer: Self-pay | Admitting: Internal Medicine

## 2012-04-04 ENCOUNTER — Other Ambulatory Visit: Payer: Self-pay | Admitting: *Deleted

## 2012-04-13 ENCOUNTER — Other Ambulatory Visit: Payer: Self-pay | Admitting: Internal Medicine

## 2012-04-16 ENCOUNTER — Other Ambulatory Visit: Payer: Self-pay | Admitting: Internal Medicine

## 2012-04-25 ENCOUNTER — Other Ambulatory Visit: Payer: Self-pay | Admitting: Internal Medicine

## 2012-06-21 ENCOUNTER — Other Ambulatory Visit: Payer: Self-pay | Admitting: Internal Medicine

## 2012-06-27 ENCOUNTER — Other Ambulatory Visit: Payer: Self-pay | Admitting: Internal Medicine

## 2012-07-19 ENCOUNTER — Other Ambulatory Visit: Payer: Self-pay | Admitting: Internal Medicine

## 2012-07-20 ENCOUNTER — Other Ambulatory Visit: Payer: Self-pay | Admitting: *Deleted

## 2012-07-20 NOTE — Telephone Encounter (Signed)
Received fax pt needing PA on diovan 320 mg. Notified insurance faxed over PA form has been completed and fax bck. Waiting on approval status...Raechel Chute

## 2012-07-21 NOTE — Telephone Encounter (Signed)
Received Pa back med has been approve. Notified pharmacy...Raechel Chute

## 2012-08-27 ENCOUNTER — Other Ambulatory Visit: Payer: Self-pay | Admitting: Internal Medicine

## 2012-09-11 ENCOUNTER — Other Ambulatory Visit: Payer: Self-pay | Admitting: Internal Medicine

## 2012-09-16 ENCOUNTER — Encounter: Payer: Self-pay | Admitting: Internal Medicine

## 2012-09-16 ENCOUNTER — Encounter: Payer: Self-pay | Admitting: Family Medicine

## 2012-09-16 ENCOUNTER — Ambulatory Visit (INDEPENDENT_AMBULATORY_CARE_PROVIDER_SITE_OTHER): Payer: BC Managed Care – PPO | Admitting: Family Medicine

## 2012-09-16 VITALS — BP 100/70 | Temp 98.3°F | Wt 310.0 lb

## 2012-09-16 DIAGNOSIS — J209 Acute bronchitis, unspecified: Secondary | ICD-10-CM

## 2012-09-16 DIAGNOSIS — J069 Acute upper respiratory infection, unspecified: Secondary | ICD-10-CM

## 2012-09-16 MED ORDER — FLUTICASONE PROPIONATE HFA 44 MCG/ACT IN AERO: 1.0000 | INHALATION_SPRAY | Freq: Two times a day (BID) | RESPIRATORY_TRACT | Status: DC

## 2012-09-16 MED ORDER — BENZONATATE 100 MG PO CAPS: 100.0000 mg | ORAL_CAPSULE | Freq: Two times a day (BID) | ORAL | Status: DC | PRN

## 2012-09-16 MED ORDER — DOXYCYCLINE HYCLATE 100 MG PO TABS: 100.0000 mg | ORAL_TABLET | Freq: Two times a day (BID) | ORAL | Status: DC

## 2012-09-16 NOTE — Patient Instructions (Signed)
INSTRUCTIONS FOR UPPER RESPIRATORY INFECTION:  -plenty of rest and fluids  -take inhaler twice daily for the next week or until symptoms resolved  -take allergy medication every day  -nasal saline wash 2-3 times daily (use prepackaged nasal saline or bottled/distilled water if making your own)   -can use sinex or afrin nasal spray for drainage and nasal congestion - but do NOT use longer then 3-4 days  -can use tylenol or ibuprofen as directed for aches and sorethroat  -in the winter time, using a humidifier at night is helpful (please follow cleaning instructions)  -if you are taking a cough medication - use only as directed, may also try a teaspoon of honey to coat the throat and throat lozenges  -for sore throat, salt water gargles can help  -follow up if you have fevers, facial pain, tooth pain, difficulty breathing or are worsening or not getting better in 5-7 days

## 2012-09-16 NOTE — Progress Notes (Signed)
Chief Complaint  Patient presents with  . Cough    severe congestion, sore throat, feeling hot/cold, lack of energy and SOB x 1 week;     HPI:  Acute visit for cough and congestion: -started 1 week ago -symptoms: nasal congestion, cough, drainage in throat, fatigue, feels hot and cold at times, sore throat, mild SOB at times, ear fullness -denies: fevers, NVD, wheezing, tooth pain -has tried: albuterol for the SOB - doesn't help much, on claritin, musinex -hc of anxiety, AR, no hx asthma or lung disease -she wants cough medication that won't effect BP at all ROS: See pertinent positives and negatives per HPI.  Past Medical History  Diagnosis Date  . Diabetes mellitus type 2 in obese   . Hypertension   . Depression   . Anxiety   . Hyperlipidemia   . Anemia   . Allergic rhinitis   . Morbid obesity     Family History  Problem Relation Age of Onset  . Diabetes Mother   . Diabetes Other   . Other Neg Hx     History   Social History  . Marital Status: Married    Spouse Name: N/A    Number of Children: N/A  . Years of Education: N/A   Social History Main Topics  . Smoking status: Never Smoker   . Smokeless tobacco: Never Used     Comment: Married  . Alcohol Use: No  . Drug Use: No  . Sexually Active: Not Currently   Other Topics Concern  . None   Social History Narrative  . None    Current outpatient prescriptions:albuterol (PROAIR HFA) 108 (90 BASE) MCG/ACT inhaler, Inhale 2 puffs into the lungs 4 (four) times daily., Disp: 18 g, Rfl: 2;  BAYER BREEZE 2 TEST DISK, TEST BLOOD GLUCOSE UP TO 6 TIMES A DAY AS DIRECTED, Disp: 200 each, Rfl: 2;  citalopram (CELEXA) 40 MG tablet, TAKE 1 TABLET BY MOUTH ONCE DAILY, Disp: 30 tablet, Rfl: 5;  DIOVAN 320 MG tablet, TAKE 1 TABLET EVERY DAY, Disp: 30 tablet, Rfl: 1 fluticasone (FLONASE) 50 MCG/ACT nasal spray, Place 2 sprays into the nose daily., Disp: 16 g, Rfl: 6;  Hydroquinone-Sunscreens (LUSTRA-ULTRA) 4 % CREA, APPLY TO  DARK AREAS ON SKIN TWICE DAILY, Disp: 56.8 g, Rfl: 0;  ibuprofen (ADVIL,MOTRIN) 600 MG tablet, , Disp: , Rfl: ;  KLOR-CON M20 20 MEQ tablet, TAKE 1 TABLET BY MOUTH EVERY DAY, Disp: 30 tablet, Rfl: 4 LANTUS SOLOSTAR 100 UNIT/ML injection, INJECT 80 UNITS INTO THE SKIN AT BEDTIME, Disp: 30 mL, Rfl: 5;  metFORMIN (GLUCOPHAGE) 500 MG tablet, TAKE 2 TABLETS TWICE A DAY, Disp: 120 tablet, Rfl: 1;  metoprolol succinate (TOPROL-XL) 50 MG 24 hr tablet, TAKE 1 TABLET BY MOUTH DAILY. TAKE WITH OR IMMEDIATELY FOLLOWING A MEAL., Disp: 30 tablet, Rfl: 1 NOVOLOG FLEXPEN 100 UNIT/ML injection, INJECT 20 UNITS INTO THE SKIN 3 TIMES A DAY BEFORE MEALS, Disp: 30 Syringe, Rfl: 2;  NOVOLOG FLEXPEN 100 UNIT/ML injection, INJECT 20 UNITS INTO THE SKIN 3 TIMES A DAY BEFORE MEALS, Disp: 2 pen, Rfl: 2;  promethazine-phenylephrine (PROMETHAZINE-PHENYLEPHRINE) 6.25-5 MG/5ML SYRP, Take 5 mLs by mouth every 4 (four) hours as needed., Disp: 200 mL, Rfl: 0 torsemide (DEMADEX) 20 MG tablet, TAKE 1 TABLET BY MOUTH TWICE DAILY, Disp: 60 tablet, Rfl: 5;  benzonatate (TESSALON) 100 MG capsule, Take 1 capsule (100 mg total) by mouth 2 (two) times daily as needed for cough., Disp: 20 capsule, Rfl: 0;  doxycycline (VIBRA-TABS) 100  MG tablet, Take 1 tablet (100 mg total) by mouth 2 (two) times daily., Disp: 20 tablet, Rfl: 0 fluticasone (FLOVENT HFA) 44 MCG/ACT inhaler, Inhale 1 puff into the lungs 2 (two) times daily., Disp: 1 Inhaler, Rfl: 0;  Hydrocodone-Acetaminophen 5-300 MG TABS, , Disp: , Rfl: ;  irbesartan (AVAPRO) 300 MG tablet, Take 1 tablet (300 mg total) by mouth at bedtime., Disp: 30 tablet, Rfl: 5;  levofloxacin (LEVAQUIN) 500 MG tablet, Take 1 tablet (500 mg total) by mouth daily., Disp: 7 tablet, Rfl: 0  EXAM:  Filed Vitals:   09/16/12 1404  BP: 100/70  Temp: 98.3 F (36.8 C)    Body mass index is 51.59 kg/(m^2).  GENERAL: vitals reviewed and listed above, alert, oriented, appears well hydrated and in no acute  distress  HEENT: atraumatic, conjunttiva clear, no obvious abnormalities on inspection of external nose and ears  NECK: no obvious masses on inspection  LUNGS: clear to auscultation bilaterally, no wheezes, rales or rhonchi, good air movement  CV: HRRR, no peripheral edema  MS: moves all extremities without noticeable abnormality  PSYCH: pleasant and cooperative, no obvious depression or anxiety  ASSESSMENT AND PLAN:  Discussed the following assessment and plan:  Upper respiratory infection - Plan: benzonatate (TESSALON) 100 MG capsule, fluticasone (FLOVENT HFA) 44 MCG/ACT inhaler  Acute bronchitis - Plan: doxycycline (VIBRA-TABS) 100 MG tablet  -likely viral upper resp infection with mild bronchitis - lung exam and O2 sats normal. Tx supportive care and ICS. If worsening or not improving abx - risks discussed, shred if doe snot use. See doctor if worsening or not improving. -Patient advised to return or notify a doctor immediately if symptoms worsen or persist or new concerns arise.  Patient Instructions  INSTRUCTIONS FOR UPPER RESPIRATORY INFECTION:  -plenty of rest and fluids  -take inhaler twice daily for the next week or until symptoms resolved  -take allergy medication every day  -nasal saline wash 2-3 times daily (use prepackaged nasal saline or bottled/distilled water if making your own)   -can use sinex or afrin nasal spray for drainage and nasal congestion - but do NOT use longer then 3-4 days  -can use tylenol or ibuprofen as directed for aches and sorethroat  -in the winter time, using a humidifier at night is helpful (please follow cleaning instructions)  -if you are taking a cough medication - use only as directed, may also try a teaspoon of honey to coat the throat and throat lozenges  -for sore throat, salt water gargles can help  -follow up if you have fevers, facial pain, tooth pain, difficulty breathing or are worsening or not getting better in 5-7  days      Heather Myers R.

## 2012-09-24 ENCOUNTER — Other Ambulatory Visit: Payer: Self-pay | Admitting: Internal Medicine

## 2012-09-26 ENCOUNTER — Other Ambulatory Visit (INDEPENDENT_AMBULATORY_CARE_PROVIDER_SITE_OTHER): Payer: BC Managed Care – PPO

## 2012-09-26 ENCOUNTER — Encounter: Payer: Self-pay | Admitting: Internal Medicine

## 2012-09-26 ENCOUNTER — Ambulatory Visit (INDEPENDENT_AMBULATORY_CARE_PROVIDER_SITE_OTHER): Payer: BC Managed Care – PPO | Admitting: Internal Medicine

## 2012-09-26 VITALS — BP 132/78 | HR 93 | Temp 98.6°F | Wt 308.4 lb

## 2012-09-26 DIAGNOSIS — Z0000 Encounter for general adult medical examination without abnormal findings: Secondary | ICD-10-CM

## 2012-09-26 DIAGNOSIS — Z23 Encounter for immunization: Secondary | ICD-10-CM

## 2012-09-26 DIAGNOSIS — E119 Type 2 diabetes mellitus without complications: Secondary | ICD-10-CM

## 2012-09-26 DIAGNOSIS — E785 Hyperlipidemia, unspecified: Secondary | ICD-10-CM

## 2012-09-26 DIAGNOSIS — I10 Essential (primary) hypertension: Secondary | ICD-10-CM

## 2012-09-26 LAB — URINALYSIS, ROUTINE W REFLEX MICROSCOPIC
Nitrite: NEGATIVE
Specific Gravity, Urine: 1.025 (ref 1.000–1.030)
Urine Glucose: NEGATIVE
pH: 6 (ref 5.0–8.0)

## 2012-09-26 LAB — LIPID PANEL
Cholesterol: 188 mg/dL (ref 0–200)
LDL Cholesterol: 122 mg/dL — ABNORMAL HIGH (ref 0–99)
Total CHOL/HDL Ratio: 6
Triglycerides: 159 mg/dL — ABNORMAL HIGH (ref 0.0–149.0)
VLDL: 31.8 mg/dL (ref 0.0–40.0)

## 2012-09-26 LAB — CBC WITH DIFFERENTIAL/PLATELET
Basophils Relative: 0.3 % (ref 0.0–3.0)
Eosinophils Absolute: 0.3 10*3/uL (ref 0.0–0.7)
Eosinophils Relative: 3.3 % (ref 0.0–5.0)
HCT: 33.5 % — ABNORMAL LOW (ref 36.0–46.0)
Hemoglobin: 11.1 g/dL — ABNORMAL LOW (ref 12.0–15.0)
MCHC: 33.3 g/dL (ref 30.0–36.0)
MCV: 86 fl (ref 78.0–100.0)
Monocytes Absolute: 0.6 10*3/uL (ref 0.1–1.0)
Neutro Abs: 5.8 10*3/uL (ref 1.4–7.7)
Neutrophils Relative %: 60.2 % (ref 43.0–77.0)
RBC: 3.89 Mil/uL (ref 3.87–5.11)
WBC: 9.7 10*3/uL (ref 4.5–10.5)

## 2012-09-26 LAB — BASIC METABOLIC PANEL
CO2: 25 mEq/L (ref 19–32)
Chloride: 100 mEq/L (ref 96–112)
Creatinine, Ser: 0.9 mg/dL (ref 0.4–1.2)
Potassium: 3.4 mEq/L — ABNORMAL LOW (ref 3.5–5.1)
Sodium: 137 mEq/L (ref 135–145)

## 2012-09-26 LAB — MICROALBUMIN / CREATININE URINE RATIO: Microalb, Ur: 2 mg/dL — ABNORMAL HIGH (ref 0.0–1.9)

## 2012-09-26 LAB — HEPATIC FUNCTION PANEL
Albumin: 3.1 g/dL — ABNORMAL LOW (ref 3.5–5.2)
Alkaline Phosphatase: 93 U/L (ref 39–117)
Total Protein: 7.7 g/dL (ref 6.0–8.3)

## 2012-09-26 MED ORDER — CITALOPRAM HYDROBROMIDE 40 MG PO TABS: 40.0000 mg | ORAL_TABLET | Freq: Every day | ORAL | Status: DC

## 2012-09-26 MED ORDER — VALSARTAN 320 MG PO TABS: 320.0000 mg | ORAL_TABLET | Freq: Every day | ORAL | Status: DC

## 2012-09-26 MED ORDER — POTASSIUM CHLORIDE CRYS ER 20 MEQ PO TBCR: 20.0000 meq | EXTENDED_RELEASE_TABLET | Freq: Every day | ORAL | Status: DC

## 2012-09-26 MED ORDER — METOPROLOL SUCCINATE ER 50 MG PO TB24: 50.0000 mg | ORAL_TABLET | Freq: Every day | ORAL | Status: DC

## 2012-09-26 MED ORDER — INSULIN GLARGINE 100 UNIT/ML SOLOSTAR PEN: PEN_INJECTOR | SUBCUTANEOUS | Status: DC

## 2012-09-26 MED ORDER — METFORMIN HCL ER 500 MG PO TB24: 1000.0000 mg | ORAL_TABLET | Freq: Two times a day (BID) | ORAL | Status: DC

## 2012-09-26 MED ORDER — TORSEMIDE 20 MG PO TABS: 20.0000 mg | ORAL_TABLET | Freq: Every day | ORAL | Status: DC

## 2012-09-26 NOTE — Assessment & Plan Note (Signed)
Historically has declined statin due to feared side effects, but has been unable to lose weight Check now and pt willing to consider statin trial if LDL>130

## 2012-09-26 NOTE — Assessment & Plan Note (Signed)
Variable compliance, complicated by obesity and insulin resistance On ARB, consider statin if LDL >130 (pt now agreeable to same) -  Reminded of need for annual eye exam and follow up for a1c Lab Results  Component Value Date   HGBA1C 8.4* 07/21/2011

## 2012-09-26 NOTE — Assessment & Plan Note (Signed)
  BP Readings from Last 3 Encounters:  09/26/12 132/78  09/16/12 100/70  01/19/12 116/80   The current medical regimen is effective;  continue present plan and medications.

## 2012-09-26 NOTE — Progress Notes (Signed)
Subjective:    Patient ID: Heather Myers, female    DOB: 27-Dec-1965, 47 y.o.   MRN: [id number]  HPI  patient is here today for annual physical. Patient feels well overall.  Also reviewed chronic medical issues: DM2, HTN and hyperlipidemia Needs refills on all meds   Past Medical History  Diagnosis Date  . Diabetes mellitus type 2 in obese   . Hypertension   . Depression   . Anxiety   . Hyperlipidemia   . Anemia   . Allergic rhinitis   . Morbid obesity    Family History  Problem Relation Age of Onset  . Diabetes Mother   . Diabetes Other   . Other Neg Hx    History  Substance Use Topics  . Smoking status: Never Smoker   . Smokeless tobacco: Never Used     Comment: Married  . Alcohol Use: No    Review of Systems  Constitutional: Positive for fatigue. Negative for fever.  Respiratory: Negative for cough.   Cardiovascular: Negative for palpitations and leg swelling.  Gastrointestinal: Negative for nausea and vomiting.  Musculoskeletal: Negative for gait problem or joint swelling.  Skin: Negative for rash.  Neurological: Negative for dizziness or headache.  No other specific complaints in a complete review of systems (except as listed in HPI above).      Objective:   Physical Exam  BP 132/78  Pulse 93  Temp(Src) 98.6 F (37 C) (Oral)  Wt 308 lb 6.4 oz (139.889 kg)  BMI 51.32 kg/m2  SpO2 97% Wt Readings from Last 3 Encounters:  09/26/12 308 lb 6.4 oz (139.889 kg)  09/16/12 310 lb (140.615 kg)  01/19/12 312 lb 8 oz (141.749 kg)   Constitutional: She is obese, but appears well-developed and well-nourished. No distress.  HENT: Head: Normocephalic and atraumatic. Ears: B TMs ok, no erythema or effusion; Nose: Nose normal. Mouth/Throat: Oropharynx is clear and moist. No oropharyngeal exudate.  Eyes: Conjunctivae and EOM are normal. Pupils are equal, round, and reactive to light. No scleral icterus.  Neck: Normal range of motion. Neck supple. No JVD  or LAD present. Thick neck.  Cardiovascular: Normal rate, regular rhythm and normal heart sounds.  No murmur heard. trace distal BLE edema. Pulmonary/Chest: Effort normal and breath sounds normal. No respiratory distress. She has no wheezes.  Abdominal: Soft. Bowel sounds are normal. She exhibits no distension. There is no tenderness. no masses Musculoskeletal: Normal range of motion, no joint effusions. No gross deformities Neurological: She is alert and oriented to person, place, and time. No cranial nerve deficit. Coordination, balance, strength, speech and gait are normal.  Skin: Skin is warm and dry. No rash noted. No erythema.  Psychiatric: She has a normal mood and affect. Her behavior is normal. Judgment and thought content normal.     Lab Results  Component Value Date   WBC 9.3 12/21/2008   HGB 11.0* 08/03/2011   HCT 39.0 12/21/2008   HCT 36.4 12/21/2008   PLT 486.0* 12/21/2008   GLUCOSE 189* 07/31/2011   CHOL 213* 09/10/2010   TRIG 214* 09/10/2010   HDL 43 09/10/2010   LDLCALC 127 09/10/2010   ALT 12 01/13/2007   AST 16 01/13/2007   NA 136 07/31/2011   K 3.7 07/31/2011   CL 98 07/31/2011   CREATININE 0.45* 07/31/2011   BUN 9 07/31/2011   CO2 25 07/31/2011   TSH 1.50 09/10/2010   HGBA1C 8.4* 07/21/2011   MICROALBUR 0.3 08/23/2006  Assessment & Plan:  CPX/v70.0 - Patient has been counseled on age-appropriate routine health concerns for screening and prevention. These are reviewed and up-to-date. Immunizations are up-to-date or declined. Labs ordered and reviewed.  Also see problem list. Medications and labs reviewed today.

## 2012-09-26 NOTE — Patient Instructions (Addendum)
It was good to see you today. We have reviewed your prior records including labs and tests today Health Maintenance reviewed - all recommended immunizations and age-appropriate screenings are up-to-date. Test(s) ordered today. Your results will be released to MyChart (or called to you) after review, usually within 72hours after test completion. If any changes need to be made, you will be notified at that same time. Medications reviewed and updated, no changes recommended at this time. Work on lifestyle changes as discussed (low fat, low carb, increased protein diet; improved exercise efforts; weight loss) to control sugar, blood pressure and cholesterol levels and/or reduce risk of developing other medical problems. Look into [email or web address] or other type of food journal to assist you in this process. Please schedule followup in 6 months, call sooner if problems. Health Maintenance, Females A healthy lifestyle and preventative care can promote health and wellness.  Maintain regular health, dental, and eye exams.  Eat a healthy diet. Foods like vegetables, fruits, whole grains, low-fat dairy products, and lean protein foods contain the nutrients you need without too many calories. Decrease your intake of foods high in solid fats, added sugars, and salt. Get information about a proper diet from your caregiver, if necessary.  Regular physical exercise is one of the most important things you can do for your health. Most adults should get at least 150 minutes of moderate-intensity exercise (any activity that increases your heart rate and causes you to sweat) each week. In addition, most adults need muscle-strengthening exercises on 2 or more days a week.   Maintain a healthy weight. The body mass index (BMI) is a screening tool to identify possible weight problems. It provides an estimate of body fat based on height and weight. Your caregiver can help determine your BMI, and can help you achieve or  maintain a healthy weight. For adults 20 years and older:  A BMI below 18.5 is considered underweight.  A BMI of 18.5 to 24.9 is normal.  A BMI of 25 to 29.9 is considered overweight.  A BMI of 30 and above is considered obese.  Maintain normal blood lipids and cholesterol by exercising and minimizing your intake of saturated fat. Eat a balanced diet with plenty of fruits and vegetables. Blood tests for lipids and cholesterol should begin at age 52 and be repeated every 5 years. If your lipid or cholesterol levels are high, you are over 50, or you are a high risk for heart disease, you may need your cholesterol levels checked more frequently.Ongoing high lipid and cholesterol levels should be treated with medicines if diet and exercise are not effective.  If you smoke, find out from your caregiver how to quit. If you do not use tobacco, do not start.  If you are pregnant, do not drink alcohol. If you are breastfeeding, be very cautious about drinking alcohol. If you are not pregnant and choose to drink alcohol, do not exceed 1 drink per day. One drink is considered to be 12 ounces (355 mL) of beer, 5 ounces (148 mL) of wine, or 1.5 ounces (44 mL) of liquor.  Avoid use of street drugs. Do not share needles with anyone. Ask for help if you need support or instructions about stopping the use of drugs.  High blood pressure causes heart disease and increases the risk of stroke. Blood pressure should be checked at least every 1 to 2 years. Ongoing high blood pressure should be treated with medicines, if weight loss and exercise are not effective.  If you are 30 to 45 years old, ask your caregiver if you should take aspirin to prevent strokes.  Diabetes screening involves taking a blood sample to check your fasting blood sugar level. This should be done once every 3 years, after age 17, if you are within normal weight and without risk factors for diabetes. Testing should be considered at a younger  age or be carried out more frequently if you are overweight and have at least 1 risk factor for diabetes.  Breast cancer screening is essential preventative care for women. You should practice "breast self-awareness." This means understanding the normal appearance and feel of your breasts and may include breast self-examination. Any changes detected, no matter how small, should be reported to a caregiver. Women in their 21s and 30s should have a clinical breast exam (CBE) by a caregiver as part of a regular health exam every 1 to 3 years. After age 20, women should have a CBE every year. Starting at age 32, women should consider having a mammogram (breast X-ray) every year. Women who have a family history of breast cancer should talk to their caregiver about genetic screening. Women at a high risk of breast cancer should talk to their caregiver about having an MRI and a mammogram every year.  The Pap test is a screening test for cervical cancer. Women should have a Pap test starting at age 63. Between ages 71 and 41, Pap tests should be repeated every 2 years. Beginning at age 6, you should have a Pap test every 3 years as long as the past 3 Pap tests have been normal. If you had a hysterectomy for a problem that was not cancer or a condition that could lead to cancer, then you no longer need Pap tests. If you are between ages 31 and 73, and you have had normal Pap tests going back 10 years, you no longer need Pap tests. If you have had past treatment for cervical cancer or a condition that could lead to cancer, you need Pap tests and screening for cancer for at least 20 years after your treatment. If Pap tests have been discontinued, risk factors (such as a new sexual partner) need to be reassessed to determine if screening should be resumed. Some women have medical problems that increase the chance of getting cervical cancer. In these cases, your caregiver may recommend more frequent screening and Pap  tests.  The human papillomavirus (HPV) test is an additional test that may be used for cervical cancer screening. The HPV test looks for the virus that can cause the cell changes on the cervix. The cells collected during the Pap test can be tested for HPV. The HPV test could be used to screen women aged 54 years and older, and should be used in women of any age who have unclear Pap test results. After the age of 67, women should have HPV testing at the same frequency as a Pap test.  Colorectal cancer can be detected and often prevented. Most routine colorectal cancer screening begins at the age of 59 and continues through age 11. However, your caregiver may recommend screening at an earlier age if you have risk factors for colon cancer. On a yearly basis, your caregiver may provide home test kits to check for hidden blood in the stool. Use of a small camera at the end of a tube, to directly examine the colon (sigmoidoscopy or colonoscopy), can detect the earliest forms of colorectal cancer. Talk to your  caregiver about this at age 11, when routine screening begins. Direct examination of the colon should be repeated every 5 to 10 years through age 27, unless early forms of pre-cancerous polyps or small growths are found.  Hepatitis C blood testing is recommended for all people born from [birth year] through 1963 and any individual with known risks for hepatitis C.  Practice safe sex. Use condoms and avoid high-risk sexual practices to reduce the spread of sexually transmitted infections (STIs). Sexually active women aged 6 and younger should be checked for Chlamydia, which is a common sexually transmitted infection. Older women with new or multiple partners should also be tested for Chlamydia. Testing for other STIs is recommended if you are sexually active and at increased risk.  Osteoporosis is a disease in which the bones lose minerals and strength with aging. This can result in serious bone fractures. The risk  of osteoporosis can be identified using a bone density scan. Women ages 73 and over and women at risk for fractures or osteoporosis should discuss screening with their caregivers. Ask your caregiver whether you should be taking a calcium supplement or vitamin D to reduce the rate of osteoporosis.  Menopause can be associated with physical symptoms and risks. Hormone replacement therapy is available to decrease symptoms and risks. You should talk to your caregiver about whether hormone replacement therapy is right for you.  Use sunscreen with a sun protection factor (SPF) of 30 or greater. Apply sunscreen liberally and repeatedly throughout the day. You should seek shade when your shadow is shorter than you. Protect yourself by wearing long sleeves, pants, a wide-brimmed hat, and sunglasses year round, whenever you are outdoors.  Notify your caregiver of new moles or changes in moles, especially if there is a change in shape or color. Also notify your caregiver if a mole is larger than the size of a pencil eraser.  Stay current with your immunizations. Document Released: 10/27/2010 Document Revised: 07/06/2011 Document Reviewed: 10/27/2010 [facility] Patient Information 2014 [place], [place].

## 2012-09-27 ENCOUNTER — Other Ambulatory Visit: Payer: Self-pay | Admitting: *Deleted

## 2012-09-27 ENCOUNTER — Encounter: Payer: Self-pay | Admitting: Internal Medicine

## 2012-09-27 MED ORDER — ATORVASTATIN CALCIUM 10 MG PO TABS: 10.0000 mg | ORAL_TABLET | Freq: Every day | ORAL | Status: DC

## 2012-09-27 MED ORDER — INSULIN ASPART 100 UNIT/ML FLEXPEN: 20.0000 [IU] | PEN_INJECTOR | Freq: Three times a day (TID) | SUBCUTANEOUS | Status: DC

## 2012-09-27 NOTE — Telephone Encounter (Signed)
Called pt concerning labs results. Pt states she needed refill on her insulin. infom pt will send to pharmacy.Marland KitchenRaechel Chute

## 2012-10-07 ENCOUNTER — Other Ambulatory Visit: Payer: Self-pay | Admitting: Internal Medicine

## 2012-10-31 ENCOUNTER — Emergency Department (HOSPITAL_COMMUNITY): Payer: BC Managed Care – PPO

## 2012-10-31 ENCOUNTER — Emergency Department (HOSPITAL_COMMUNITY)
Admission: EM | Admit: 2012-10-31 | Discharge: 2012-10-31 | Disposition: A | Discharge status: Home / Self Care | Payer: BC Managed Care – PPO | Attending: Emergency Medicine | Admitting: Emergency Medicine

## 2012-10-31 ENCOUNTER — Encounter (HOSPITAL_COMMUNITY): Payer: Self-pay | Admitting: Emergency Medicine

## 2012-10-31 ENCOUNTER — Other Ambulatory Visit: Payer: Self-pay

## 2012-10-31 ENCOUNTER — Telehealth: Payer: Self-pay | Admitting: Internal Medicine

## 2012-10-31 DIAGNOSIS — M7989 Other specified soft tissue disorders: Secondary | ICD-10-CM | POA: Insufficient documentation

## 2012-10-31 DIAGNOSIS — J3489 Other specified disorders of nose and nasal sinuses: Secondary | ICD-10-CM | POA: Insufficient documentation

## 2012-10-31 DIAGNOSIS — R110 Nausea: Secondary | ICD-10-CM | POA: Insufficient documentation

## 2012-10-31 DIAGNOSIS — R059 Cough, unspecified: Secondary | ICD-10-CM | POA: Insufficient documentation

## 2012-10-31 DIAGNOSIS — Z794 Long term (current) use of insulin: Secondary | ICD-10-CM | POA: Insufficient documentation

## 2012-10-31 DIAGNOSIS — E785 Hyperlipidemia, unspecified: Secondary | ICD-10-CM | POA: Insufficient documentation

## 2012-10-31 DIAGNOSIS — Z79899 Other long term (current) drug therapy: Secondary | ICD-10-CM | POA: Insufficient documentation

## 2012-10-31 DIAGNOSIS — K5900 Constipation, unspecified: Secondary | ICD-10-CM | POA: Insufficient documentation

## 2012-10-31 DIAGNOSIS — R1013 Epigastric pain: Secondary | ICD-10-CM | POA: Insufficient documentation

## 2012-10-31 DIAGNOSIS — R05 Cough: Secondary | ICD-10-CM | POA: Insufficient documentation

## 2012-10-31 DIAGNOSIS — Z888 Allergy status to other drugs, medicaments and biological substances status: Secondary | ICD-10-CM | POA: Insufficient documentation

## 2012-10-31 DIAGNOSIS — J309 Allergic rhinitis, unspecified: Secondary | ICD-10-CM | POA: Insufficient documentation

## 2012-10-31 DIAGNOSIS — R0602 Shortness of breath: Secondary | ICD-10-CM | POA: Insufficient documentation

## 2012-10-31 DIAGNOSIS — M549 Dorsalgia, unspecified: Secondary | ICD-10-CM | POA: Insufficient documentation

## 2012-10-31 DIAGNOSIS — E119 Type 2 diabetes mellitus without complications: Secondary | ICD-10-CM | POA: Insufficient documentation

## 2012-10-31 DIAGNOSIS — F341 Dysthymic disorder: Secondary | ICD-10-CM | POA: Insufficient documentation

## 2012-10-31 DIAGNOSIS — I10 Essential (primary) hypertension: Secondary | ICD-10-CM | POA: Insufficient documentation

## 2012-10-31 DIAGNOSIS — R1310 Dysphagia, unspecified: Secondary | ICD-10-CM | POA: Insufficient documentation

## 2012-10-31 DIAGNOSIS — R079 Chest pain, unspecified: Secondary | ICD-10-CM | POA: Insufficient documentation

## 2012-10-31 LAB — COMPREHENSIVE METABOLIC PANEL
Alkaline Phosphatase: 112 U/L (ref 39–117)
BUN: 7 mg/dL (ref 6–23)
Chloride: 98 mEq/L (ref 96–112)
GFR calc Af Amer: >90 mL/min (ref >90)
GFR calc non Af Amer: >90 mL/min (ref >90)
Glucose, Bld: 113 mg/dL — ABNORMAL HIGH (ref 70–99)
Potassium: 3.4 mEq/L — ABNORMAL LOW (ref 3.5–5.1)
Total Bilirubin: 0.3 mg/dL (ref 0.3–1.2)

## 2012-10-31 LAB — CBC WITH DIFFERENTIAL/PLATELET
Eosinophils Absolute: 0.3 10*3/uL (ref 0.0–0.7)
HCT: 34.7 % — ABNORMAL LOW (ref 36.0–46.0)
Hemoglobin: 10.9 g/dL — ABNORMAL LOW (ref 12.0–15.0)
Lymphs Abs: 3.2 10*3/uL (ref 0.7–4.0)
MCH: 27.5 pg (ref 26.0–34.0)
Monocytes Relative: 5 % (ref 3–12)
Neutro Abs: 4.7 10*3/uL (ref 1.7–7.7)
Neutrophils Relative %: 54 % (ref 43–77)
RBC: 3.97 MIL/uL (ref 3.87–5.11)

## 2012-10-31 LAB — CBC
Hemoglobin: 10.8 g/dL — ABNORMAL LOW (ref 12.0–15.0)
MCV: 87.3 fL (ref 78.0–100.0)
Platelets: 507 10*3/uL — ABNORMAL HIGH (ref 150–400)
RBC: 3.86 MIL/uL — ABNORMAL LOW (ref 3.87–5.11)
WBC: 8.6 10*3/uL (ref 4.0–10.5)

## 2012-10-31 LAB — LIPASE, BLOOD: Lipase: 25 U/L (ref 11–59)

## 2012-10-31 MED ORDER — FAMOTIDINE 20 MG PO TABS
40.0000 mg | ORAL_TABLET | Freq: Once | ORAL | Status: AC
  Administered 2012-10-31: 40 mg via ORAL
  Filled 2012-10-31: qty 2

## 2012-10-31 MED ORDER — SODIUM CHLORIDE 0.9 % IV SOLN
INTRAVENOUS | Status: DC
  Administered 2012-10-31: 15:00:00 via INTRAVENOUS

## 2012-10-31 MED ORDER — ESOMEPRAZOLE MAGNESIUM 20 MG PO PACK: 20.0000 mg | PACK | Freq: Every day | ORAL | Status: DC

## 2012-10-31 MED ORDER — SUCRALFATE 1 G PO TABS: 1.0000 g | ORAL_TABLET | Freq: Four times a day (QID) | ORAL | Status: DC

## 2012-10-31 MED ORDER — GI COCKTAIL ~~LOC~~
30.0000 mL | Freq: Once | ORAL | Status: AC
  Administered 2012-10-31: 30 mL via ORAL
  Filled 2012-10-31: qty 30

## 2012-10-31 MED ORDER — IOHEXOL 350 MG/ML SOLN
100.0000 mL | Freq: Once | INTRAVENOUS | Status: AC | PRN
  Administered 2012-10-31: 100 mL via INTRAVENOUS

## 2012-10-31 NOTE — ED Notes (Signed)
Pt. Stated, I started having chest pain like throbbing off and on, I think its a very bad case of indigestion.  Every time I eat or drink something it hurts really bad.  Called my Dr. And the nurse said to come here to be evaluated.  I also have some SOB and I've had a cold , but it started before the chest pain.

## 2012-10-31 NOTE — ED Provider Notes (Signed)
I saw and evaluated the patient, reviewed the resident's note and I agree with the findings and plan.  Patient describes epigastric pain is worse with swallowing liquids. She also notes exertional dyspnea. Did have a recent history of travel week ago on a plane. We'll treat her GERD symptoms as well as will her out for pulmonary embolism.  Toy Baker, MD 10/31/12 [phone]

## 2012-10-31 NOTE — ED Notes (Signed)
310 lbs. Stated, pt

## 2012-10-31 NOTE — ED Provider Notes (Signed)
History    CSN: [id number] Arrival date & time 10/31/12  1250  First MD Initiated Contact with Patient 10/31/12 1404     Chief Complaint  Patient presents with  . Chest Pain    HPI   Pt is a 47 yo AA F with pmh of insulin dep DM, HTN, morbid obesity, HL, anemia, and recent travel who presents with CP that is worse with eating for 1 week.  Pt recently traveled to [place] [facility] and returned last Monday with complaints of chest congestion, dry cough with no hemoptysis, and SOB worse with deep breathing. She took doxycycline for a few days with no resolution of symptoms. Symptoms progressed to dull 4/10  Intermittent retrosternal CP radiating to back that is worse with eating, particularly after eating a oily meal with pasta last week. She also reports pain and trouble swallowing to both solids and liquids and feeling of food being stuck in her chest. Pt took OTC medication including tums, alka seltzer, and pepcid with no relief of symptoms. No reports of heart burn, burping, sour taste in mouth, or excessive salivation. No fevers, chills, weight loss, syncope, or leg pain.        Pt has had to use inhaler for past few days due to SOB that is worse with deep breathing. No abdominal pain or vomiting, but does report nausea. BM are more formed and less frequent than usual (1x/ day vs normally 3-4x/day due to metformin). No blood in stool.    Pt with chronic swelling of both legs and is on a diuretic. Pt was recently started Lipitor 3 weeks ago. She had GERD 7 years ago and prescribed Nexium at that time with resolution of symptoms and has not been on medication since. Has never had upper endoscopy done before.       Past Medical History  Diagnosis Date  . Diabetes mellitus type 2 in obese   . Hypertension   . Depression   . Anxiety   . Hyperlipidemia   . Anemia   . Allergic rhinitis   . Morbid obesity    Past Surgical History  Procedure Laterality Date  . Cholecystectomy    . Cesarean  section      x's 2  . Rso    . 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 Abdominal surgery      chronic abd wall abscess  . Oophorectomy      R ovary removed at [facility] History  Problem Relation Age of Onset  . Diabetes Mother   . Diabetes Other   . Other Neg Hx    History  Substance Use Topics  . Smoking status: Never Smoker   . Smokeless tobacco: Never Used     Comment: Married  . Alcohol Use: No   OB History   Grav Para Term Preterm Abortions TAB SAB Ect Mult Living   3 2   1  1   2      Review of Systems  Constitutional: Negative for fever, chills, appetite change and fatigue.  HENT: Positive for congestion and trouble swallowing (pain on swallowing).   Respiratory: Positive for cough and shortness of breath.   Cardiovascular: Positive for chest pain and leg swelling (chronic bilateral swelling). Negative for palpitations.  Gastrointestinal: Positive for nausea  and constipation. Negative for vomiting, abdominal pain, diarrhea, blood in stool and abdominal distention.  Endocrine: Negative.   Genitourinary: Negative.   Musculoskeletal: Positive for back pain (CP radiating to back).  Skin: Negative.   Neurological: Negative.   Hematological: Negative.   Psychiatric/Behavioral: Negative.     Allergies  Ramipril  Home Medications   Current Outpatient Rx  Name  Route  Sig  Dispense  Refill  . albuterol (PROAIR HFA) 108 (90 BASE) MCG/ACT inhaler   Inhalation   Inhale 2 puffs into the lungs 4 (four) times daily.   18 g   2   . atorvastatin (LIPITOR) 10 MG tablet   Oral   Take 1 tablet (10 mg total) by mouth daily.   90 tablet   3   . BAYER BREEZE 2 TEST DISK      TEST BLOOD GLUCOSE UP TO 6 TIMES A DAY AS DIRECTED   200 each   2   . citalopram (CELEXA) 40 MG  tablet   Oral   Take 1 tablet (40 mg total) by mouth daily.   30 tablet   11   . Famotidine (PEPCID AC PO)   Oral   Take 1 tablet by mouth every 6 (six) hours as needed.         . fluticasone (FLONASE) 50 MCG/ACT nasal spray   Nasal   Place 2 sprays into the nose daily.   16 g   6   . fluticasone (FLOVENT HFA) 44 MCG/ACT inhaler   Inhalation   Inhale 1 puff into the lungs 2 (two) times daily.   1 Inhaler   0   . Hydroquinone-Sunscreens (LUSTRA-ULTRA) 4 % CREA      APPLY TO DARK AREAS ON SKIN TWICE DAILY   56.8 g   0   . insulin aspart (NOVOLOG FLEXPEN) 100 UNIT/ML SOPN FlexPen   Subcutaneous   Inject 20 Units into the skin 3 (three) times daily with meals.         . Insulin Glargine (LANTUS SOLOSTAR [place])   Subcutaneous   Inject 80 Units into the skin daily.         . metFORMIN (GLUCOPHAGE XR) 500 MG 24 hr tablet   Oral   Take 2 tablets (1,000 mg total) by mouth 2 (two) times daily.   120 tablet   11   . metoprolol succinate (TOPROL-XL) 50 MG 24 hr tablet   Oral   Take 1 tablet (50 mg total) by mouth daily. Take with or immediately following a meal.   30 tablet   11   . Multiple Vitamins-Minerals (MULTIVITAMIN WITH MINERALS) tablet   Oral   Take 1 tablet by mouth daily.         . potassium chloride SA (KLOR-CON M20) 20 MEQ tablet   Oral   Take 1 tablet (20 mEq total) by mouth daily.   30 tablet   11   . Pseudoephedrine HCl (SUDAFED 12 HOUR PO)   Oral   Take 1 tablet by mouth every 6 (six) hours as needed.         . torsemide (DEMADEX) 20 MG tablet   Oral   Take 1 tablet (20 mg total) by mouth daily.   60 tablet   11   . valsartan (DIOVAN) 320 MG tablet   Oral   Take 1 tablet (320 mg total) by mouth daily.   30 tablet   11    BP 135/80  Pulse 81  Temp(Src) 97.6 F (36.4 C) (Oral)  Resp 17  SpO2 100%  LMP 10/24/2012 Physical Exam  Constitutional: She is oriented to person, place, and time. She appears well-developed and  well-nourished.  Morbidly obese   HENT:  Head: Normocephalic and atraumatic.  Eyes: EOM are normal.  Neck: Normal range of motion. Neck supple.  Cardiovascular: Normal rate, regular rhythm and normal heart sounds.   Pulmonary/Chest: Effort normal and breath sounds normal. No respiratory distress. She has no wheezes. She has no rales. She exhibits no tenderness.  Abdominal: Soft. Bowel sounds are normal. She exhibits no distension. There is no tenderness. There is no rebound and no guarding.  Musculoskeletal: Normal range of motion. She exhibits edema (bilateral nonpitting edema of legs).  Neurological: She is alert and oriented to person, place, and time.  Skin: Skin is warm and dry. No pallor.  Psychiatric: She has a normal mood and affect.    ED Course  Procedures (including critical care time) Labs Reviewed  CBC - Abnormal; Notable for the following:    RBC 3.86 (*)    Hemoglobin 10.8 (*)    HCT 33.7 (*)    Platelets 507 (*)    All other components within normal limits  CBC WITH DIFFERENTIAL - Abnormal; Notable for the following:    Hemoglobin 10.9 (*)    HCT 34.7 (*)    Platelets 492 (*)    All other components within normal limits  COMPREHENSIVE METABOLIC PANEL - Abnormal; Notable for the following:    Potassium 3.4 (*)    Glucose, Bld 113 (*)    Albumin 3.3 (*)    All other components within normal limits  PRO B NATRIURETIC PEPTIDE  LIPASE, BLOOD  POCT I-STAT TROPONIN I   Dg Chest 2 View  10/31/2012   *RADIOLOGY REPORT*  Clinical Data: Chest pain  CHEST - 2 VIEW  Comparison: 04/07/2011  Findings: Low volume film without edema or focal lung consolidation. Cardiopericardial silhouette is at upper limits of normal for size. Imaged bony structures of the thorax are intact.  IMPRESSION: Low volume film without acute cardiopulmonary findings.   Original Report Authenticated By: Kennith Center, M.D.   Ct Angio Chest Pe W/cm &/or Wo Cm  10/31/2012   *RADIOLOGY REPORT*  Clinical  Data: Shortness of breath.  Upper back pain.  CT ANGIOGRAPHY CHEST  Technique:  Multidetector CT imaging of the chest using the standard protocol during bolus administration of intravenous contrast. Multiplanar reconstructed images including MIPs were obtained and reviewed to evaluate the vascular anatomy.  Contrast: OMNIPAQUE IOHEXOL 350 MG/ML SOLN  Comparison: Plain film earlier today.  CT 06/12/2010  Findings: Lung windows demonstrate moderate degradation secondary patient body habitus.  There is also mild motion degradation.  No airspace opacities.  Lung bases minimally excluded.  Soft tissue windows:  The quality of this exam for evaluation of pulmonary embolism is poor to moderate.  Primary limitations are detailed above.  No evidence of central or large lobar pulmonary embolism.  Smaller emboli cannot be excluded.  Pulmonary artery enlargement, with the outflow tract measuring 3.4 cm.  Normal aortic caliber without dissection.  Mild to moderate cardiomegaly, without pericardial or pleural effusion.  Similar borderline enlarged azygo-esophageal recess 9 mm node.  No hilar adenopathy.  Limited abdominal imaging demonstrates No acute osseous abnormality.  IMPRESSION:  1.  Poor to moderate quality evaluation for pulmonary embolism secondary to patient body habitus and motion artifact.  No large embolism identified. 2.  No other explanation  for chest pain/shortness of breath. 3. Pulmonary artery enlargement suggests pulmonary arterial hypertension. 4.  Cardiomegaly.   Original Report Authenticated By: Jeronimo Greaves, M.D.   No diagnosis found.  MDM  Assessment:  46 yo AA F with pmh of insulin dep DM, HTN, morbid obesity, HL, anemia, and recent travel who presents with CP that is worse with meals for 1 week.    Plan:  Chest Pain - PE vs ACS vs GERD vs Dyspepsia vs Diffuse Esophageal spasm vs Esophagitis  -Obtain 12-lead EKG ---> no STEMI/NSTEMI, cannot r/o anterior infarct of undetermined age -Obtain  troponins ---> neg x 1 -Obtain CXR ---> no acute cardiopulmonary findings -Obtain CTA chest ---> No evidence of PE  -Obtain CBC w/diff ---> no leukocytosis, normocytic anemia    -Obtain CMP--->   AST/ALT/Alk Phos/Bili WNL  -Obtain BNP---> WNL  -Obtain Lipase---> WNL  -Administer GI cocktail & Pepcid PO 40 mg for suspected GERD ---> reports feeling better    Disposition: Home ---> Pain better after GI cocktail. CP most likely non-cardiac in origin due to normal EKG, troponin, CXR, CTA Chest & BNP. Etiology unclear, most likely due to GERD  vs dyspepsia vs diffuse esophageal spasm.     Discharge Instructions: To take Nexium 20mg  daily and sucralfate 1 tab x 4 daily as empiric trial for acid suppression     Pt also advised to not take metformin for 2 days due to recent contrast administration. Pt also informed of QT long prolongation with combination of citalopram and nexium.   Will need to f/u with GI for further workup for noncardac CP due to GERD and esophageal dysphagia considering alarm features of odynophagia and anemia (iron deficient?).                          Otis Brace, MD 10/31/12 2046  Otis Brace, MD 10/31/12 2047

## 2012-10-31 NOTE — Telephone Encounter (Signed)
Patient Information:  Caller Name: Aleja  Phone: [phone]  Patient: Heather Myers, Heather Myers  Gender: Female  DOB: 10-06-1965  Age: 47 Years  PCP: Rene Paci (Adults only)  Pregnant: No  Office Follow Up:  Does the office need to follow up with this patient?: No  Instructions For The Office: N/A  RN Note:  If chest pain lasting 5 minutes will call 911.  Symptoms  Reason For Call & Symptoms: Has had chest pain daily for 4 days, started 7/3 and happens multiple times daily.  Pain comes in center of chest and to the right of center.  Pain lasting up to one minute or less and feels like tightness of chest.  Thought might be reflux since pain comes if even eats cracker or drink water or gingerale.  Has taken Tums (as many as 7-8 at a time), AlkaSeltzer, Pepcid but no relief of Sx but will burp.  When walking or climbing stairs gets SOB.  Notes chest soreness when swallows.  Sometimes pain goes to upper back with the chest tightness.  Reviewed Health History In EMR: Yes  Reviewed Medications In EMR: Yes  Reviewed Allergies In EMR: Yes  Reviewed Surgeries / Procedures: Yes  Date of Onset of Symptoms: 10/27/2012  Treatments Tried: Tums 7-8 at a time, AlkaSeltzer, Pepcid  Treatments Tried Worked: No OB / GYN:  LMP: 10/24/2012  Guideline(s) Used:  Chest Pain  Disposition Per Guideline:   Go to ED Now  Reason For Disposition Reached:   Difficulty breathing  Advice Given:  Call Back If:  Severe chest pain  Constant chest pain lasting longer than 5 minutes  You become worse.  Patient Will Follow Care Advice:  YES

## 2012-11-01 NOTE — ED Provider Notes (Signed)
I saw and evaluated the patient, reviewed the resident's note and I agree with the findings and plan.  Toy Baker, MD 11/01/12 1515

## 2012-11-05 ENCOUNTER — Other Ambulatory Visit: Payer: Self-pay | Admitting: Internal Medicine

## 2012-12-14 ENCOUNTER — Encounter: Payer: Self-pay | Admitting: Internal Medicine

## 2012-12-14 ENCOUNTER — Ambulatory Visit (INDEPENDENT_AMBULATORY_CARE_PROVIDER_SITE_OTHER): Payer: BC Managed Care – PPO | Admitting: Internal Medicine

## 2012-12-14 VITALS — BP 118/60 | HR 104 | Temp 98.4°F | Wt 309.8 lb

## 2012-12-14 DIAGNOSIS — IMO0002 Reserved for concepts with insufficient information to code with codable children: Secondary | ICD-10-CM

## 2012-12-14 DIAGNOSIS — M79609 Pain in unspecified limb: Secondary | ICD-10-CM

## 2012-12-14 DIAGNOSIS — T304 Corrosion of unspecified body region, unspecified degree: Secondary | ICD-10-CM

## 2012-12-14 DIAGNOSIS — T300 Burn of unspecified body region, unspecified degree: Secondary | ICD-10-CM

## 2012-12-14 DIAGNOSIS — M79601 Pain in right arm: Secondary | ICD-10-CM

## 2012-12-14 MED ORDER — TRAMADOL HCL 50 MG PO TABS: 50.0000 mg | ORAL_TABLET | Freq: Three times a day (TID) | ORAL | Status: DC | PRN
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
## 2012-12-15 ENCOUNTER — Other Ambulatory Visit (HOSPITAL_COMMUNITY): Payer: BC Managed Care – PPO

## 2012-12-16 ENCOUNTER — Ambulatory Visit (HOSPITAL_COMMUNITY)
Admission: RE | Admit: 2012-12-16 | Discharge: 2012-12-16 | Disposition: A | Discharge status: Home / Self Care | Payer: BC Managed Care – PPO | Source: Ambulatory Visit | Attending: Internal Medicine | Admitting: Internal Medicine

## 2012-12-16 DIAGNOSIS — IMO0002 Reserved for concepts with insufficient information to code with codable children: Secondary | ICD-10-CM | POA: Insufficient documentation

## 2012-12-16 DIAGNOSIS — M79601 Pain in right arm: Secondary | ICD-10-CM

## 2012-12-16 DIAGNOSIS — T304 Corrosion of unspecified body region, unspecified degree: Secondary | ICD-10-CM

## 2012-12-16 DIAGNOSIS — T22019A Burn of unspecified degree of unspecified forearm, initial encounter: Secondary | ICD-10-CM | POA: Insufficient documentation

## 2013-01-01 ENCOUNTER — Other Ambulatory Visit: Payer: Self-pay | Admitting: Internal Medicine

## 2013-01-09 ENCOUNTER — Other Ambulatory Visit: Payer: Self-pay | Admitting: *Deleted

## 2013-01-09 MED ORDER — INSULIN PEN NEEDLE 31G X 8 MM MISC: Status: DC

## 2013-01-09 NOTE — Telephone Encounter (Signed)
Received fax pt needing refill on her BD pen needles short 31G x 5/16...lmb

## 2013-02-01 ENCOUNTER — Other Ambulatory Visit: Payer: Self-pay | Admitting: *Deleted

## 2013-02-01 MED ORDER — GLUCOSE BLOOD VI DISK: DISK | Status: DC

## 2013-03-24 ENCOUNTER — Other Ambulatory Visit: Payer: Self-pay | Admitting: Internal Medicine

## 2013-03-26 ENCOUNTER — Other Ambulatory Visit: Payer: Self-pay | Admitting: Internal Medicine

## 2013-03-30 ENCOUNTER — Encounter: Payer: Self-pay | Admitting: Gynecology

## 2013-03-30 ENCOUNTER — Ambulatory Visit (INDEPENDENT_AMBULATORY_CARE_PROVIDER_SITE_OTHER): Payer: BC Managed Care – PPO | Admitting: Gynecology

## 2013-03-30 VITALS — BP 110/72

## 2013-03-30 DIAGNOSIS — D649 Anemia, unspecified: Secondary | ICD-10-CM

## 2013-03-30 DIAGNOSIS — N946 Dysmenorrhea, unspecified: Secondary | ICD-10-CM

## 2013-03-30 DIAGNOSIS — N920 Excessive and frequent menstruation with regular cycle: Secondary | ICD-10-CM

## 2013-03-30 DIAGNOSIS — J329 Chronic sinusitis, unspecified: Secondary | ICD-10-CM

## 2013-03-30 LAB — CBC WITH DIFFERENTIAL/PLATELET
Basophils Absolute: 0 10*3/uL (ref 0.0–0.1)
Eosinophils Relative: 3 % (ref 0–5)
Lymphocytes Relative: 35 % (ref 12–46)
MCV: 86.3 fL (ref 78.0–100.0)
Neutrophils Relative %: 55 % (ref 43–77)
Platelets: 527 10*3/uL — ABNORMAL HIGH (ref 150–400)
RDW: 15.2 % (ref 11.5–15.5)
WBC: 10.5 10*3/uL (ref 4.0–10.5)

## 2013-03-30 MED ORDER — CEFUROXIME AXETIL 250 MG PO TABS: 250.0000 mg | ORAL_TABLET | Freq: Two times a day (BID) | ORAL | Status: DC

## 2013-03-30 MED ORDER — MEGESTROL ACETATE 40 MG PO TABS: 40.0000 mg | ORAL_TABLET | Freq: Two times a day (BID) | ORAL | Status: DC

## 2013-03-30 NOTE — Patient Instructions (Signed)

## 2013-03-30 NOTE — Progress Notes (Signed)
   The patient is a 47 year old gravida 4 para 2 AB 2 who has not been seen in the office in over 4 years presented to the office complaining of worsening menorrhagia, dysmenorrhea and feeling tired and fatigued. Her PCP has her on iron supplementation for her anemia. She stated in 2011 she had a laparotomy and had left salpingo-oophorectomy for benign ovarian mass. Since she has not been seen in the office since that time her record has been off site and have been requested for further review. Patient also was complaining of sinus congestion and postnasal drip. She denies any fever, chills, nausea, vomiting or cough.  Exam: Morbidly obese HEENT: Tender maxillary sinus bilateral Lungs: Clear to auscultation a Rogers or wheezing Heart: Regular rate and rhythm no murmurs or gallops Abdomen: Pendulous soft nontender no rebound or guarding Pelvic exam: Bartholin's urethra Skene was within normal limits Vagina: Some blood was present in the vaginal vault Cervix: No lesions or discharge Bimanual exam limited due to pendulous abdomen and the patient's obesity Adnexa: Same as above Rectal exam: Not done  The patient was counseled for endometrial biopsy as a result of her menorrhagia. The cervix was cleansed with Betadine solution. The uterus sounded to 8 cm. The sterile Pipelle was introduced into the uterine cavity and tissue was submitted for histological evaluation. Of note patient states she has not been sexually active for 9 months and has not been using any form of contraception. Patient's PCP is treating her for hypertension, hyperlipidemia and diabetes.  Assessment/plan: Obese patient with metromenorrhagia high-risk for endometrial hyperplasia or endometrial cancer. Endometrial biopsy done today results pending at time of this dictation. Patient will be started on Megace 40 mg twice a day for the next 15 days help stop her bleeding as we schedule for a sonohysterogram within that time to rule out  any intracavitary defect. A CBC, TSH and prolactin were obtained today. For sinusitis she'll be started on Ceftin 500 mg twice a day for 7 days. She was encouraged to increase her iron tablet from once a day to twice a day.

## 2013-03-31 ENCOUNTER — Ambulatory Visit: Payer: BC Managed Care – PPO | Admitting: Internal Medicine

## 2013-03-31 ENCOUNTER — Telehealth: Payer: Self-pay | Admitting: *Deleted

## 2013-03-31 ENCOUNTER — Other Ambulatory Visit: Payer: Self-pay | Admitting: Gynecology

## 2013-03-31 DIAGNOSIS — R7989 Other specified abnormal findings of blood chemistry: Secondary | ICD-10-CM

## 2013-03-31 DIAGNOSIS — Z0289 Encounter for other administrative examinations: Secondary | ICD-10-CM

## 2013-03-31 DIAGNOSIS — D509 Iron deficiency anemia, unspecified: Secondary | ICD-10-CM

## 2013-03-31 NOTE — Telephone Encounter (Signed)
error 

## 2013-04-05 ENCOUNTER — Other Ambulatory Visit: Payer: Self-pay | Admitting: Gynecology

## 2013-04-05 DIAGNOSIS — N946 Dysmenorrhea, unspecified: Secondary | ICD-10-CM

## 2013-04-05 DIAGNOSIS — N920 Excessive and frequent menstruation with regular cycle: Secondary | ICD-10-CM

## 2013-04-05 DIAGNOSIS — D649 Anemia, unspecified: Secondary | ICD-10-CM

## 2013-04-07 ENCOUNTER — Telehealth: Payer: Self-pay | Admitting: Gynecology

## 2013-04-07 ENCOUNTER — Encounter: Payer: Self-pay | Admitting: Gynecology

## 2013-04-07 ENCOUNTER — Ambulatory Visit (INDEPENDENT_AMBULATORY_CARE_PROVIDER_SITE_OTHER): Payer: BC Managed Care – PPO

## 2013-04-07 ENCOUNTER — Ambulatory Visit (INDEPENDENT_AMBULATORY_CARE_PROVIDER_SITE_OTHER): Payer: BC Managed Care – PPO | Admitting: Gynecology

## 2013-04-07 DIAGNOSIS — N920 Excessive and frequent menstruation with regular cycle: Secondary | ICD-10-CM

## 2013-04-07 DIAGNOSIS — N946 Dysmenorrhea, unspecified: Secondary | ICD-10-CM

## 2013-04-07 DIAGNOSIS — D649 Anemia, unspecified: Secondary | ICD-10-CM

## 2013-04-07 NOTE — Progress Notes (Addendum)
Patient is a 47 year old gravida 4 para 2 AB 2 who was seen in the office on 03/30/2013 for the first time in 4 years. Patient had been complaining of worsening menorrhagia, dysmenorrhea and feeling tired and fatigued. Her PCP has her on iron supplementation for her anemia. She stated in 2011 she had a laparotomy and had left salpingo-oophorectomy for benign ovarian mass. Since she has not been seen in the office since that time her record has been off site and have been requested for further review. On that office visit patient underwent an endometrial biopsy and was started on Megace 40 mg twice a day for 15 days to help stop her bleeding until the time of her sonohysterogram which is a recently she is here for today and to discuss treatment options. We had discussed before about starting her on the Mirena IUD for contraception as well as cycle control. Her pathology report was as follows:  Diagnosis Endometrium, biopsy, uterus - INACTIVE ENDOMETRIUM WITH BREAKDOWN. - BENIGN ENDOCERVICAL EPITHELIUM. - NO HYPERPLASIA OR MALIGNANCY  The patient's CBC demonstrated her hemoglobin was 11.6 and hematocrit 35.3 with platelet count 527,000. Her TSH and prolactin were normal also.  Patient's ultrasound today as follows: Uterus measuring 9.6 x 7.0 x 5.5 cm with endometrial stripe of 7.5 mm. Patient had 3 fibroids the largest one measuring 17 x 12 mm. 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 No free fluid seen. The cervix had previously been cleansed with Betadine solution and a sterile Pipelle was introduced into the uterine cavity whereby normal saline was instilled. There was no intracavitary defect noted.  Patient was then counseled for placement of Mirena IUD. The risks benefits and pros and cons were discussed. The patient noted this form of contraception and for 5 years and is 99% effective. The uterus sounded to approximately 8 cm. The Mirena IUD was adjusted  accordingly. Under ultrasound guidance the Mirena IUD was introduced and placed in the appropriate location without any complications and the IUD string was trimmed. Ultrasound once again was done to confirm placement of the IUD.  The IUD was placed laterally for contraception but for cycle control so that we can get her anemia corrected. We had discussed that since she is overweight and with her other comorbidities she would be a high-risk for endometrial hyperplasia or endometrial cancer in the Mirena IUD would help prevent that. Literature and information was provided all questions were answered and she will return back in one month. We will repeat her CBC to followup her thrombocytosis which may be reactive to her anemia but if persistent we will refer to hematologist.  Mirena IUD lot number [id number]

## 2013-04-07 NOTE — Telephone Encounter (Signed)
04/07/13-PT [employer] COVERS THE REMOVAL/INSERTION AND NEW MIRENA IUD AT 100% WITH A $15.00 COPAY/WL

## 2013-04-07 NOTE — Patient Instructions (Signed)
Levonorgestrel intrauterine device (IUD) What is this medicine? LEVONORGESTREL IUD (LEE voe nor jes trel) is a contraceptive (birth control) device. The device is placed inside the uterus by a healthcare professional. It is used to prevent pregnancy and can also be used to treat heavy bleeding that occurs during your period. Depending on the device, it can be used for 3 to 5 years. This medicine may be used for other purposes; ask your health care provider or pharmacist if you have questions. COMMON BRAND NAME(S): Mirena, Skyla What should I tell my health care provider before I take this medicine? They need to know if you have any of these conditions: -abnormal Pap smear -cancer of the breast, uterus, or cervix -diabetes -endometritis -genital or pelvic infection now or in the past -have more than one sexual partner or your partner has more than one partner -heart disease -history of an ectopic or tubal pregnancy -immune system problems -IUD in place -liver disease or tumor -problems with blood clots or take blood-thinners -use intravenous drugs -uterus of unusual shape -vaginal bleeding that has not been explained -an unusual or allergic reaction to levonorgestrel, other hormones, silicone, or polyethylene, medicines, foods, dyes, or preservatives -pregnant or trying to get pregnant -breast-feeding How should I use this medicine? This device is placed inside the uterus by a health care professional. Talk to your pediatrician regarding the use of this medicine in children. Special care may be needed. Overdosage: If you think you have taken too much of this medicine contact a poison control center or emergency room at once. NOTE: This medicine is only for you. Do not share this medicine with others. What if I miss a dose? This does not apply. What may interact with this medicine? Do not take this medicine with any of the following  medications: -amprenavir -bosentan -fosamprenavir This medicine may also interact with the following medications: -aprepitant -barbiturate medicines for inducing sleep or treating seizures -bexarotene -griseofulvin -medicines to treat seizures like carbamazepine, ethotoin, felbamate, oxcarbazepine, phenytoin, topiramate -modafinil -pioglitazone -rifabutin -rifampin -rifapentine -some medicines to treat HIV infection like atazanavir, indinavir, lopinavir, nelfinavir, tipranavir, ritonavir -St. John's wort -warfarin This list may not describe all possible interactions. Give your health care provider a list of all the medicines, herbs, non-prescription drugs, or dietary supplements you use. Also tell them if you smoke, drink alcohol, or use illegal drugs. Some items may interact with your medicine. What should I watch for while using this medicine? Visit your doctor or health care professional for regular check ups. See your doctor if you or your partner has sexual contact with others, becomes HIV positive, or gets a sexual transmitted disease. This product does not protect you against HIV infection (AIDS) or other sexually transmitted diseases. You can check the placement of the IUD yourself by reaching up to the top of your vagina with clean fingers to feel the threads. Do not pull on the threads. It is a good habit to check placement after each menstrual period. Call your doctor right away if you feel more of the IUD than just the threads or if you cannot feel the threads at all. The IUD may come out by itself. You may become pregnant if the device comes out. If you notice that the IUD has come out use a backup birth control method like condoms and call your health care provider. Using tampons will not change the position of the IUD and are okay to use during your period. What side effects may I   notice from receiving this medicine? Side effects that you should report to your doctor or  health care professional as soon as possible: -allergic reactions like skin rash, itching or hives, swelling of the face, lips, or tongue -fever, flu-like symptoms -genital sores -high blood pressure -no menstrual period for 6 weeks during use -pain, swelling, warmth in the leg -pelvic pain or tenderness -severe or sudden headache -signs of pregnancy -stomach cramping -sudden shortness of breath -trouble with balance, talking, or walking -unusual vaginal bleeding, discharge -yellowing of the eyes or skin Side effects that usually do not require medical attention (report to your doctor or health care professional if they continue or are bothersome): -acne -breast pain -change in sex drive or performance -changes in weight -cramping, dizziness, or faintness while the device is being inserted -headache -irregular menstrual bleeding within first 3 to 6 months of use -nausea This list may not describe all possible side effects. Call your doctor for medical advice about side effects. You may report side effects to FDA at 1-800-FDA-1088. Where should I keep my medicine? This does not apply. NOTE: This sheet is a summary. It may not cover all possible information. If you have questions about this medicine, talk to your doctor, pharmacist, or health care provider.  2014, [employer]vier/Gold Standard. (2011-05-14 13:54:04)  

## 2013-04-10 LAB — HM PAP SMEAR

## 2013-05-05 ENCOUNTER — Ambulatory Visit (INDEPENDENT_AMBULATORY_CARE_PROVIDER_SITE_OTHER): Payer: BC Managed Care – PPO | Admitting: Gynecology

## 2013-05-05 ENCOUNTER — Telehealth: Payer: Self-pay | Admitting: *Deleted

## 2013-05-05 ENCOUNTER — Telehealth: Payer: Self-pay

## 2013-05-05 ENCOUNTER — Ambulatory Visit: Payer: BC Managed Care – PPO | Admitting: Internal Medicine

## 2013-05-05 ENCOUNTER — Encounter: Payer: Self-pay | Admitting: Gynecology

## 2013-05-05 DIAGNOSIS — R739 Hyperglycemia, unspecified: Secondary | ICD-10-CM

## 2013-05-05 DIAGNOSIS — Z30431 Encounter for routine checking of intrauterine contraceptive device: Secondary | ICD-10-CM

## 2013-05-05 DIAGNOSIS — R7309 Other abnormal glucose: Secondary | ICD-10-CM

## 2013-05-05 DIAGNOSIS — E119 Type 2 diabetes mellitus without complications: Secondary | ICD-10-CM

## 2013-05-05 NOTE — Telephone Encounter (Signed)
Pt called regarding referral for endocrinologist? I left message for pt to call.

## 2013-05-05 NOTE — Progress Notes (Addendum)
   Patient is a 48 year old gravida 4 para 2 AB 2 who was seen in the office on 03/30/2013 for the first time in 4 years. Patient had been complaining of worsening menorrhagia, dysmenorrhea and feeling tired and fatigued. Her PCP has her on iron supplementation for her anemia. She stated in 2011 she had a laparotomy and had left salpingo-oophorectomy for benign ovarian mass. Since she has not been seen in the office since that time until he office visit 04/07/2013. On that same office visit patient had an endometrial biopsy which was benign.  She also had an ultrasound that day which demonstrated the following:  Uterus measuring 9.6 x 7.0 x 5.5 cm with endometrial stripe of 7.5 mm. Patient had 3 fibroids the largest one measuring 17 x 12 mm. 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 No free fluid seen. The cervix had previously been cleansed with Betadine solution and a sterile Pipelle was introduced into the uterine cavity whereby normal saline was instilled. There was no intracavitary defect noted.  Patient band on the office visit 04/07/2013 had a Mirena IUD placed for cycle control as well as for contraception. She states that she has done well spotting for a few days but her bleeding appears to be now under control. The reason for her visit today was also for followup on the IUD as well as she brought up issues about her blood sugars being out of control. She is morbidly obese and has type 2 diabetes it appears to be brittle and is being followed by her internist. She was concerned whether the Megace I had placed her on for 3 weeks which she finished a week ago was contributing to her blood sugar alteration. I explained to her that the half life of the Megace as 50 hours so she should have worn off of the effect  by now. She also brought up issues that she had received steroid injection to her knee 6 weeks ago and I explained to her that the half life of  corticosteroid is 36 hours. Exam: Pelvic: And urethra Skene was within normal limits Vagina: No lesions or discharge Cervix: No lesions or discharge IUD string was seen and trimmed Uterus: Anteverted normal size shape and consistency Adnexa: No palpable masses or tenderness Rectal exam: Not done  Assessment/plan: Patient doing well with Mirena IUD to help control her menorrhagia and dysmenorrhea. She will contact her PCP for adjustment of her insulin requirements. Patient stated that her blood sugar today was 224 and a few days ago had been over 300.

## 2013-05-05 NOTE — Telephone Encounter (Signed)
Patient had appointment for Friday (05/05/13) afternoon.  Called pt informing her we had to reschedule. She stated she could not move, walk, or stand due to her blood sugars problems. I asked her if she wanted to seek emergent care or if she wanted to speak with [facility], however, she declined.  I asked her if she wanted to schedule at a different office today, however, she refused stating she would only see Dr.Leschber.  I offered her an ov for Monday, but she again refused.  The patient ended the call.

## 2013-05-08 ENCOUNTER — Ambulatory Visit (INDEPENDENT_AMBULATORY_CARE_PROVIDER_SITE_OTHER): Payer: BC Managed Care – PPO | Admitting: Internal Medicine

## 2013-05-08 ENCOUNTER — Other Ambulatory Visit (INDEPENDENT_AMBULATORY_CARE_PROVIDER_SITE_OTHER): Payer: BC Managed Care – PPO

## 2013-05-08 ENCOUNTER — Ambulatory Visit (INDEPENDENT_AMBULATORY_CARE_PROVIDER_SITE_OTHER)
Admission: RE | Admit: 2013-05-08 | Discharge: 2013-05-08 | Disposition: A | Discharge status: Home / Self Care | Payer: BC Managed Care – PPO | Source: Ambulatory Visit | Attending: Internal Medicine | Admitting: Internal Medicine

## 2013-05-08 ENCOUNTER — Encounter: Payer: Self-pay | Admitting: Internal Medicine

## 2013-05-08 VITALS — BP 118/72 | HR 119 | Temp 97.0°F | Resp 16 | Ht 65.0 in | Wt 302.0 lb

## 2013-05-08 DIAGNOSIS — K5900 Constipation, unspecified: Secondary | ICD-10-CM

## 2013-05-08 DIAGNOSIS — R002 Palpitations: Secondary | ICD-10-CM

## 2013-05-08 DIAGNOSIS — D509 Iron deficiency anemia, unspecified: Secondary | ICD-10-CM

## 2013-05-08 DIAGNOSIS — I10 Essential (primary) hypertension: Secondary | ICD-10-CM

## 2013-05-08 DIAGNOSIS — E119 Type 2 diabetes mellitus without complications: Secondary | ICD-10-CM

## 2013-05-08 DIAGNOSIS — R0602 Shortness of breath: Secondary | ICD-10-CM

## 2013-05-08 LAB — COMPREHENSIVE METABOLIC PANEL
ALBUMIN: 3.4 g/dL — AB (ref 3.5–5.2)
ALT: 17 U/L (ref 0–35)
AST: 24 U/L (ref 0–37)
Alkaline Phosphatase: 110 U/L (ref 39–117)
BUN: 9 mg/dL (ref 6–23)
CHLORIDE: 101 meq/L (ref 96–112)
CO2: 27 meq/L (ref 19–32)
Calcium: 9.5 mg/dL (ref 8.4–10.5)
Creatinine, Ser: 0.7 mg/dL (ref 0.4–1.2)
GFR: 112.39 mL/min (ref >60.00)
Glucose, Bld: 215 mg/dL — ABNORMAL HIGH (ref 70–99)
POTASSIUM: 3.6 meq/L (ref 3.5–5.1)
SODIUM: 137 meq/L (ref 135–145)
TOTAL PROTEIN: 8.5 g/dL — AB (ref 6.0–8.3)
Total Bilirubin: 0.7 mg/dL (ref 0.3–1.2)

## 2013-05-08 LAB — CBC WITH DIFFERENTIAL/PLATELET
Basophils Absolute: 0 10*3/uL (ref 0.0–0.1)
Basophils Relative: 0.5 % (ref 0.0–3.0)
EOS PCT: 2.6 % (ref 0.0–5.0)
Eosinophils Absolute: 0.3 10*3/uL (ref 0.0–0.7)
HEMATOCRIT: 36.7 % (ref 36.0–46.0)
HEMOGLOBIN: 12.3 g/dL (ref 12.0–15.0)
Lymphocytes Relative: 26.5 % (ref 12.0–46.0)
Lymphs Abs: 2.6 10*3/uL (ref 0.7–4.0)
MCHC: 33.5 g/dL (ref 30.0–36.0)
MCV: 86.2 fl (ref 78.0–100.0)
MONO ABS: 0.7 10*3/uL (ref 0.1–1.0)
Monocytes Relative: 7.3 % (ref 3.0–12.0)
NEUTROS ABS: 6.2 10*3/uL (ref 1.4–7.7)
NEUTROS PCT: 63.1 % (ref 43.0–77.0)
Platelets: 581 10*3/uL — ABNORMAL HIGH (ref 150.0–400.0)
RBC: 4.25 Mil/uL (ref 3.87–5.11)
RDW: 15.7 % — ABNORMAL HIGH (ref 11.5–14.6)
WBC: 9.8 10*3/uL (ref 4.5–10.5)

## 2013-05-08 LAB — FERRITIN: Ferritin: 9.7 ng/mL — ABNORMAL LOW (ref 10.0–291.0)

## 2013-05-08 LAB — VITAMIN B12: VITAMIN B 12: 571 pg/mL (ref 211–911)

## 2013-05-08 LAB — IBC PANEL
IRON: 96 ug/dL (ref 42–145)
Saturation Ratios: 20.9 % (ref 20.0–50.0)
Transferrin: 327.7 mg/dL (ref 212.0–360.0)

## 2013-05-08 LAB — GLUCOSE, POCT (MANUAL RESULT ENTRY): POC GLUCOSE: 225 mg/dL — AB (ref 70–99)

## 2013-05-08 LAB — TSH: TSH: 1.37 u[IU]/mL (ref 0.35–5.50)

## 2013-05-08 LAB — FOLATE

## 2013-05-08 LAB — HEMOGLOBIN A1C: Hgb A1c MFr Bld: 8.7 % — ABNORMAL HIGH (ref 4.6–6.5)

## 2013-05-08 MED ORDER — INSULIN ASPART 100 UNIT/ML FLEXPEN: 40.0000 [IU] | PEN_INJECTOR | Freq: Three times a day (TID) | SUBCUTANEOUS | Status: DC

## 2013-05-08 MED ORDER — INSULIN GLARGINE 100 UNIT/ML SOLOSTAR PEN: 140.0000 [IU] | PEN_INJECTOR | Freq: Every day | SUBCUTANEOUS | Status: DC

## 2013-05-08 MED ORDER — CANAGLIFLOZIN 300 MG PO TABS: 1.0000 | ORAL_TABLET | Freq: Every day | ORAL | Status: DC

## 2013-05-08 MED ORDER — LINACLOTIDE 290 MCG PO CAPS: 290.0000 ug | ORAL_CAPSULE | Freq: Every day | ORAL | Status: DC

## 2013-05-08 NOTE — Patient Instructions (Signed)

## 2013-05-08 NOTE — Progress Notes (Signed)
Subjective:    Patient ID: Genessis Flanary, female    DOB: Jul 24, 1965, 48 y.o.   MRN: [id number]  HPI Comments: She got a steroid injection in her right knee about 6 weeks ago and her GYN has started megace ---> her blood sugars have been very high (over 400) so she increased her dose of lantus and novolog a few weeks ago but her blood sugars are still too high.  Constipation This is a chronic problem. The current episode started more than 1 month ago. The problem is unchanged. Her stool frequency is 2 to 3 times per week. The stool is described as formed and firm. The patient is on a high fiber diet. She does not exercise regularly. There has not been adequate water intake. Associated symptoms include weight loss. Pertinent negatives include no abdominal pain, anorexia, back pain, bloating, diarrhea, difficulty urinating, fecal incontinence, fever, flatus, hematochezia, hemorrhoids, melena, nausea, rectal pain or vomiting. Risk factors include change in medication usage/dosage. She has tried diet changes and laxatives (miralax, mag citrate, dulcolax) for the symptoms. The treatment provided no relief.      Review of Systems  Constitutional: Positive for weight loss and unexpected weight change (some weight loss). Negative for fever, chills, diaphoresis, appetite change and fatigue.  HENT: Negative.   Eyes: Negative.   Respiratory: Positive for shortness of breath. Negative for cough, choking, chest tightness and stridor.   Cardiovascular: Negative.  Negative for chest pain, palpitations and leg swelling.  Gastrointestinal: Positive for constipation. Negative for nausea, vomiting, abdominal pain, diarrhea, blood in stool, melena, hematochezia, anal bleeding, rectal pain, bloating, anorexia, flatus and hemorrhoids.  Endocrine: Positive for polydipsia, polyphagia and polyuria.  Genitourinary: Negative.  Negative for difficulty urinating.  Musculoskeletal: Negative.  Negative for back pain.    Skin: Negative.   Allergic/Immunologic: Negative.   Neurological: Negative.   Hematological: Negative.  Negative for adenopathy. Does not bruise/bleed easily.  Psychiatric/Behavioral: Negative.        Objective:   Physical Exam  Vitals reviewed. Constitutional: She is oriented to person, place, and time. She appears well-developed and well-nourished. No distress.  HENT:  Head: Normocephalic and atraumatic.  Mouth/Throat: Oropharynx is clear and moist. No oropharyngeal exudate.  Eyes: Conjunctivae are normal. Right eye exhibits no discharge. Left eye exhibits no discharge. No scleral icterus.  Neck: Normal range of motion. Neck supple. No JVD present. No tracheal deviation present. No thyromegaly present.  Cardiovascular: Normal rate, regular rhythm, normal heart sounds and intact distal pulses.  Exam reveals no gallop and no friction rub.   No murmur heard. Pulmonary/Chest: Effort normal and breath sounds normal. No stridor. No respiratory distress. She has no wheezes. She has no rales. She exhibits no tenderness.  Abdominal: Soft. Bowel sounds are normal. She exhibits no distension and no mass. There is no tenderness. There is no rebound and no guarding.  Musculoskeletal: Normal range of motion. She exhibits no edema and no tenderness.  Lymphadenopathy:    She has no cervical adenopathy.  Neurological: She is oriented to person, place, and time.  Skin: Skin is warm and dry. No rash noted. She is not diaphoretic. No erythema. No pallor.  Psychiatric: She has a normal mood and affect. Her behavior is normal. Judgment and thought content normal.     Lab Results  Component Value Date   WBC 10.5 03/30/2013   HGB 11.6* 03/30/2013   HCT 35.3* 03/30/2013   PLT 527* 03/30/2013   GLUCOSE 113* 10/31/2012   CHOL 188  09/26/2012   TRIG 159.0* 09/26/2012   HDL 34.10* 09/26/2012   LDLCALC 122* 09/26/2012   ALT 11 10/31/2012   AST 18 10/31/2012   NA 137 10/31/2012   K 3.4* 10/31/2012   CL 98 10/31/2012    CREATININE 0.53 10/31/2012   BUN 7 10/31/2012   CO2 29 10/31/2012   TSH 1.768 03/30/2013   HGBA1C 8.4* 09/26/2012   MICROALBUR 2.0* 09/26/2012       Assessment & Plan:

## 2013-05-08 NOTE — Progress Notes (Signed)
Pre visit review using our clinic review tool, if applicable. No additional management support is needed unless otherwise documented below in the visit note. 

## 2013-05-08 NOTE — Telephone Encounter (Signed)
Pt left message in triage voicemail stating JF asked her to make appointment with her endo. At [facility] Dr.Burns due to glucose readings, but patient was informed she would need a referral. I called to make referral and was informed that she would have to be a new patient and next appointment for her would be may 2015. I did not make this appointment for pt due to the date. I left a message asking patient to call me to discuss this.

## 2013-05-09 ENCOUNTER — Encounter: Payer: Self-pay | Admitting: Internal Medicine

## 2013-05-09 NOTE — Assessment & Plan Note (Signed)
Her previous ECHO showed LVH and very mild diastolic dysfunction Her EKG today shows persistent, unchanged tachycardia with no acute changes She does not appear to have an issue with volume overload I will check her CXR for edema and will look at her labs for secondary causes of SOB

## 2013-05-09 NOTE — Assessment & Plan Note (Signed)
She has started iron so this is the most likely cause for her constipation I have reviewed her labs and do not see a secondary metabolic cause for constipation I have asked her to try linzess to help with this

## 2013-05-09 NOTE — Assessment & Plan Note (Addendum)
Her CBC and iron level look better today Her other vitamin levels are normal

## 2013-05-09 NOTE — Telephone Encounter (Signed)
Pt informed with the below note and would like to be seen by another endocrinologist, I placed referral in epic for [facility] and they will contact patient with time and date.

## 2013-05-09 NOTE — Assessment & Plan Note (Signed)
I have recorded the change in insulin doses that she has started Her blood sugar is still too high so I have asked her to add invokana to her current regimen

## 2013-05-10 ENCOUNTER — Telehealth: Payer: Self-pay | Admitting: *Deleted

## 2013-05-10 ENCOUNTER — Other Ambulatory Visit: Payer: Self-pay | Admitting: *Deleted

## 2013-05-10 ENCOUNTER — Encounter: Payer: Self-pay | Admitting: Internal Medicine

## 2013-05-10 DIAGNOSIS — E119 Type 2 diabetes mellitus without complications: Secondary | ICD-10-CM

## 2013-05-10 MED ORDER — INSULIN GLARGINE 100 UNIT/ML SOLOSTAR PEN: 140.0000 [IU] | PEN_INJECTOR | Freq: Every day | SUBCUTANEOUS | Status: DC

## 2013-05-10 NOTE — Telephone Encounter (Signed)
Notified pharmacy escribe had been sent.

## 2013-05-10 NOTE — Telephone Encounter (Signed)
Ok to change disp quantity as requested by pharmacy to match rx'd Lantus dose Thanks

## 2013-05-10 NOTE — Telephone Encounter (Signed)
Pharmacy phoned needing pt's Lantus quantity to be increased to 64ml to acomodate the increased dosage (140 units from 80 units) so that supply will last for at least 30 days.  Please advise & I'll send to pharmacy.  Last OV with PCP 05/08/13 Leitha Schuller)  Pharmacy CB# [phone]

## 2013-05-10 NOTE — Telephone Encounter (Signed)
Pt saw Dr. Yetta Barre on 05/08/13 he has mail out lab letter pls see his response & inform pt...Raechel Chute

## 2013-05-10 NOTE — Telephone Encounter (Signed)
Notified pharmacy requested increase for lantus had been escribed per MD order.

## 2013-05-10 NOTE — Telephone Encounter (Signed)
Patient phoned requesting results from labs performed on Monday 05/08/13.  She is also needing assistance logging into her mychart.  Advised patient it was included in her AVS paperwork provided at conclusion of her visit.  States that paperwork is at home.  Please advise regarding results.   CB# [phone]

## 2013-05-11 ENCOUNTER — Ambulatory Visit: Payer: BC Managed Care – PPO | Admitting: Gynecology

## 2013-05-11 ENCOUNTER — Encounter: Payer: Self-pay | Admitting: Internal Medicine

## 2013-05-12 NOTE — Telephone Encounter (Signed)
Left voicemail message for patient with MD response.

## 2013-05-16 NOTE — Telephone Encounter (Signed)
I called [facility] to see if pt has been scheduled, they have tried to contact patient but pt has yet to call back to schedule.

## 2013-05-18 ENCOUNTER — Telehealth: Payer: Self-pay | Admitting: *Deleted

## 2013-05-18 MED ORDER — TRANEXAMIC ACID 650 MG PO TABS: ORAL_TABLET | ORAL | Status: DC

## 2013-05-18 NOTE — Telephone Encounter (Signed)
Spoke with pt regarding the below and she is going to call to have scheduled.

## 2013-05-18 NOTE — Telephone Encounter (Signed)
Pt called c/o vaginal bleeding x 10 days now, heavy, wearing tampon and pad. Changing every 2 hours, pt said she has tried megace in past but Rx made her blood sugar level elevated. Please advise

## 2013-05-18 NOTE — Telephone Encounter (Signed)
Calling prescription for Lysteda 650 mg 2 by mouth 3 times a day for 5 days to cut down her bleeding #30 refill x11. Not to exceed 5 days a month.

## 2013-05-18 NOTE — Telephone Encounter (Addendum)
Pt informed, rx sent 

## 2013-05-24 NOTE — Telephone Encounter (Signed)
appt 05/30/13 @ 1:15 pm

## 2013-05-30 ENCOUNTER — Ambulatory Visit: Payer: BC Managed Care – PPO | Admitting: Internal Medicine

## 2013-06-05 ENCOUNTER — Ambulatory Visit (INDEPENDENT_AMBULATORY_CARE_PROVIDER_SITE_OTHER): Payer: BC Managed Care – PPO | Admitting: Internal Medicine

## 2013-06-05 ENCOUNTER — Encounter: Payer: Self-pay | Admitting: Internal Medicine

## 2013-06-05 VITALS — BP 118/62 | HR 107 | Temp 98.7°F | Resp 12 | Ht 66.0 in | Wt 305.6 lb

## 2013-06-05 DIAGNOSIS — E119 Type 2 diabetes mellitus without complications: Secondary | ICD-10-CM

## 2013-06-05 MED ORDER — INSULIN PEN NEEDLE 31G X 5 MM MISC: Status: DC

## 2013-06-05 MED ORDER — GLUCOSE BLOOD VI DISK: DISK | Status: DC

## 2013-06-05 MED ORDER — CVS LANCETS THIN MISC: Status: DC

## 2013-06-05 MED ORDER — DIOVAN 320 MG PO TABS: 320.0000 mg | ORAL_TABLET | Freq: Every day | ORAL | Status: DC

## 2013-06-05 NOTE — Progress Notes (Signed)
Patient ID: Heather Myers, female   DOB: 06-May-1965, 48 y.o.   MRN: [id number]  HPI: Heather Myers is a 48 y.o.-year-old female-year-old female, referred by Dr. Jonny Ruiz, for management of DM2, insulin-dependent, uncontrolled, without complications.  Patient has been diagnosed with diabetes in 1991; she started insulin in 1993.  Last hemoglobin A1c was: Lab Results  Component Value Date   HGBA1C 8.7* 05/08/2013   HGBA1C 8.4* 09/26/2012   HGBA1C 8.4* 07/21/2011  Previously well controlled. In 03/2013 >> steroid injection in knee. End of 03/2013 >> started Megace for prolonged uterine bleeding (took this for 3 weeks).  Sugars then were the highest then that have ever been.  Pt is on a regimen of: - Metformin XR 1000 mg po bid >> constipation - Lantus 80 units qhs - Novolog 25 units bid ac (skips b'fast usually) - Invokana 300 mg  - Chromium nicotinate 400 mcg   Pt checks her sugars 3-4 a day and they are: - am: 140-200 - 2h after b'fast: n/c - before lunch: n/c - 2h after lunch: 150 - before dinner: n/c - 2h after dinner: 160-180 No lows. Lowest sugar was 68 (skipped dinner) at 2 am; she has hypoglycemia awareness at 70.  Highest sugar was 400 before Invokana.  Pt's meals are: - Breakfast: skips usually - Lunch: sandwich with soup or past; burger - Dinner: meat + veggie + starch x 2 - Snacks: many: sweets (cravings) >> donuts, cookies, cake   - no CKD, last BUN/creatinine:  Lab Results  Component Value Date   BUN 9 05/08/2013   CREATININE 0.7 05/08/2013  She is on Valsartan. - last set of lipids: Lab Results  Component Value Date   CHOL 188 09/26/2012   HDL 34.10* 09/26/2012   LDLCALC 122* 09/26/2012   TRIG 159.0* 09/26/2012   CHOLHDL 6 09/26/2012  She is on Lipitor.  Pt has FH of DM in mother.   ROS: Constitutional: no weight gain/loss, + fatigue, no subjective hyperthermia/hypothermia, + poor sleep Eyes: + blurry vision, no xerophthalmia ENT: no sore throat, no nodules palpated in throat,  no dysphagia/odynophagia, no hoarseness Cardiovascular: no CP/+ SOB/no palpitations/leg swelling Respiratory: no cough/+ SOB Gastrointestinal: no N/V/D/+ C Musculoskeletal: no muscle/+ joint aches Skin: no rashes, + hair loss Neurological: no tremors/numbness/tingling/dizziness Psychiatric: no depression/anxiety  Past Medical History  Diagnosis Date  . Diabetes mellitus type 2 in obese   . Hypertension   . Depression   . Anxiety   . Hyperlipidemia   . Anemia   . Allergic rhinitis   . Morbid obesity    Past Surgical History  Procedure Laterality Date  . Cholecystectomy    . Cesarean section      x's 2  . Rso    . 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 Abdominal surgery      chronic abd wall abscess  . Oophorectomy      R ovary [facility] at [facility]   History   Social History  . Marital Status: Married    Spouse Name: N/A    Number of Children: 2: 69 and 75 y/o   Occupational History  . 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 Smoking status: Never Smoker   . Smokeless tobacco: Never Used     Comment:  Married  . Alcohol Use: No  . Drug Use: No  . Sexual Activity: Not Currently   Current Outpatient Prescriptions on File Prior to Visit  Medication Sig Dispense Refill  . albuterol (PROAIR HFA) 108 (90 BASE) MCG/ACT inhaler Inhale 2 puffs into the lungs 4 (four) times daily.  18 g  2  . atorvastatin (LIPITOR) 10 MG tablet Take 1 tablet (10 mg total) by mouth daily.  90 tablet  3  . Canagliflozin (INVOKANA) 300 MG TABS Take 1 tablet (300 mg total) by mouth daily.  90 tablet  3  . citalopram (CELEXA) 40 MG tablet Take 1 tablet (40 mg total) by mouth daily.  30 tablet  11  . esomeprazole (NEXIUM) 20 MG packet Take 20 mg by mouth daily before  breakfast.  30 each  0  . Famotidine (PEPCID AC PO) Take 1 tablet by mouth every 6 (six) hours as needed.      . fluticasone (FLONASE) 50 MCG/ACT nasal spray Place 2 sprays into the nose daily.  16 g  6  . fluticasone (FLOVENT HFA) 44 MCG/ACT inhaler Inhale 1 puff into the lungs 2 (two) times daily.  1 Inhaler  0  . Glucose Blood (BAYER BREEZE 2 TEST) DISK TEST BLOOD GLUCOSE UP TO 6 TIMES A DAY AS DIRECTED  200 each  5  . Hydroquinone-Sunscreens (LUSTRA-ULTRA) 4 % CREA APPLY TO DARK AREAS ON SKIN TWICE DAILY  56.8 g  0  . insulin aspart (NOVOLOG FLEXPEN) 100 UNIT/ML FlexPen Inject 40 Units into the skin 3 (three) times daily with meals.  3 pen  2  . Insulin Glargine (LANTUS SOLOSTAR) 100 UNIT/ML Solostar Pen Inject 140 Units into the skin daily at 10 pm.  45 mL  11  . Insulin Pen Needle (B-D ULTRAFINE III SHORT PEN) 31G X 8 MM MISC Use to administer insulin four time a day Dx 250.00  100 each  5  . Linaclotide (LINZESS) 290 MCG CAPS capsule Take 1 capsule (290 mcg total) by mouth daily.  90 capsule  1  . metFORMIN (GLUCOPHAGE XR) 500 MG 24 hr tablet Take 2 tablets (1,000 mg total) by mouth 2 (two) times daily.  120 tablet  11  . metoprolol succinate (TOPROL-XL) 50 MG 24 hr tablet Take 1 tablet (50 mg total) by mouth daily. Take with or immediately following a meal.  30 tablet  11  . Multiple Vitamins-Minerals (MULTIVITAMIN WITH MINERALS) tablet Take 1 tablet by mouth daily.      . potassium chloride SA (KLOR-CON M20) 20 MEQ tablet Take 1 tablet (20 mEq total) by mouth daily.  30 tablet  11  . Pseudoephedrine HCl (SUDAFED 12 HOUR PO) Take 1 tablet by mouth every 6 (six) hours as needed.      . sucralfate (CARAFATE) 1 G tablet Take 1 tablet (1 g total) by mouth 4 (four) times daily.  28 tablet  0  . torsemide (DEMADEX) 20 MG tablet TAKE 1 TABLET BY MOUTH TWICE DAILY  60 tablet  5  . tranexamic acid (LYSTEDA) 650 MG TABS tablet Take 2 by mouth 3 times a day for 5 days (Not to exceed 5 days a month)   30 tablet  11  . valsartan (DIOVAN) 320 MG tablet Take 1 tablet (320 mg total) by mouth daily.  30 tablet  11   No current facility-administered medications on file prior to visit.   Allergies  Allergen Reactions  . Ramipril Shortness Of Breath   Family History  Problem Relation Age of Onset  . Diabetes Mother   . Diabetes Other   . Other Neg Hx    PE: BP 118/62  Pulse 107  Temp(Src) 98.7 F (37.1 C) (Oral)  Resp 12  Ht 5\' 6"  (1.676 m)  Wt 305 lb 9.6 oz (138.619 kg)  BMI 49.35 kg/m2  SpO2 97% Wt Readings from Last 3 Encounters:  06/05/13 305 lb 9.6 oz (138.619 kg)  05/08/13 302 lb (136.986 kg)  12/14/12 309 lb 12.8 oz (140.524 kg)   Constitutional: obese, in NAD Eyes: PERRLA, EOMI, no exophthalmos ENT: moist mucous membranes, no thyromegaly, no cervical lymphadenopathy Cardiovascular: RRR, No MRG Respiratory: CTA B Gastrointestinal: abdomen soft, NT, ND, BS+ Musculoskeletal: no deformities, strength intact in all 4 Skin: moist, warm, no rashes Neurological: no tremor with outstretched hands, DTR normal in all 4  ASSESSMENT: 1. DM2, insulin-dependent, uncontrolled, without complications  PLAN:  1. Patient with long-standing, recently more uncontrolled diabetes, on basal-bolus insulin + oral antidiabetic regimen (meformin, Invokana), which became insufficient - I discussed with the patient ways to improve her diabetes, but also overweight and general health. She is asking specifically about weight loss medicines or gastric bypass. I suggested that she try a plant-based diet and gave her references for this. I explained the benefits and we discussed about specific changes that she needs to make in her diet. She tells me she is "addicted" to sweets. She also tells me that she witnessed her mom suffering a leg amputation and then her death at 46 years old from complications of diabetes, before she was scheduled for the second leg amputation. She is willing to try the diet as  she realizes that she needs to work on her entire body, rather than just diabetes. - We discussed about options for treatment for her diabetes, and I suggested to:  Patient Instructions  Please return in 1 month with your sugar log.  Please keep Lantus at 80 units. Increase mealtime NovoLog to 28 units. Add the following sliding scale insulin: - 120-140: + 1 unit  - 141-160: + 2 units  - 161-180: + 3 units  - 181-200: + 4 units  - 201-240: + 5 units  - 241-280: + 6 units  - 281-320: + 7 units  - 321-360: + 8 units  - >361: + 10 units - continue checking sugars at different times of the day - check 3-4 times a day, rotating checks - given sugar log and advised how to fill it and to bring it at next appt  - given foot care handout and explained the principles  - given instructions for hypoglycemia management "15-15 rule"  - I will also refer her to nutrition. - refilled lancets, strips and Diovan - Return to clinic in 1 mo with sugar log

## 2013-06-05 NOTE — Patient Instructions (Addendum)
Please return in 1 month with your sugar log.  Please keep Lantus at 80 units. Increase mealtime NovoLog to 28 units. Add the following sliding scale insulin: - 120-140: + 1 unit  - 141-160: + 2 units  - 161-180: + 3 units  - 181-200: + 4 units  - 201-240: + 5 units  - 241-280: + 6 units  - 281-320: + 7 units  - 321-360: + 8 units  - >361: + 10 units  Please consider the following ways to cut down carbs and fat and increase fiber and micronutrients in your diet: - substitute whole grain for white bread or pasta - substitute brown rice for white rice - substitute 90-calorie flat bread pieces for slices of bread when possible - substitute sweet potatoes or yams for white potatoes - substitute humus for margarine - substitute tofu for cheese when possible - substitute almond or rice milk for regular milk (would not drink soy milk daily due to concern for soy estrogen influence on breast cancer risk) - substitute dark chocolate for other sweets when possible - substitute water - can add lemon or orange slices for taste - for diet sodas (artificial sweeteners will trick your body that you can eat sweets without getting calories and will lead you to overeating and weight gain in the long run) - do not skip breakfast or other meals (this will slow down the metabolism and will result in more weight gain over time)  - can try smoothies made from fruit and almond/rice milk in am instead of regular breakfast - can also try old-fashioned (not instant) oatmeal made with almond/rice milk in am - order the dressing on the side when eating salad at a restaurant (pour less than half of the dressing on the salad) - eat as little meat as possible - can try juicing, but should not forget that juicing will get rid of the fiber, so would alternate with eating raw veg./fruits or drinking smoothies - use as little oil as possible, even when using olive oil - can dress a salad with a mix of balsamic vinegar and  lemon juice, for e.g. - use agave nectar, stevia sugar, or regular sugar rather than artificial sweateners - steam or broil/roast veggies  - snack on veggies/fruit/nuts (unsalted, preferably) when possible, rather than processed foods - reduce or eliminate aspartame in diet (it is in diet sodas, chewing gum, etc) Read the labels!  Try to read Dr. Katherina Right book: "Program for Reversing Diabetes" for the vegan concept and other ideas for healthy eating.  Plant-based diet materials:  - Lectures (you tube):  Lequita Asal: "Breaking the Food Seduction"  Doug Lisle: "How to Lose Weight, without Losing Your Mind"  Lucile Crater: "What is Insulin Resistance" [employer] - Documentaries:  Supersize Me  [employer]  Forks over [employer], Sick and Nearly Dead  [employer] the [employer] - Books:  Lequita Asal: "Program for Reversing Diabetes"  Ferol Luz: "The [place] Study"  Konrad Penta: "Supermarket Vegan" (cookbook) - Facebook pages:   Reece Agar versus Knives  Vegucated  [employer]  Food Matters - Healthy nutrition info websites:  LateTelevision.com.ee   PATIENT INSTRUCTIONS FOR TYPE 2 DIABETES:  DIET AND EXERCISE Diet and exercise is an important part of diabetic treatment.  We recommended aerobic exercise in the form of brisk walking (working between 40-60% of maximal aerobic capacity, similar to brisk walking) for 150 minutes per week (such as 30 minutes five days per week)  along with 3 times per week performing 'resistance' training (using various gauge rubber tubes with handles) 5-10 exercises involving the major muscle groups (upper body, lower body and core) performing 10-15 repetitions (or near fatigue) each exercise. Start at half the above goal but build slowly to reach the above goals. If limited by weight, joint pain, or disability, we recommend daily walking in a swimming pool with water up to waist to  reduce pressure from joints while allow for adequate exercise.    BLOOD GLUCOSES Monitoring your blood glucoses is important for continued management of your diabetes. Please check your blood glucoses 2-4 times a day: fasting, before meals and at bedtime (you can rotate these measurements - e.g. one day check before the 3 meals, the next day check before 2 of the meals and before bedtime, etc.   HYPOGLYCEMIA (low blood sugar) Hypoglycemia is usually a reaction to not eating, exercising, or taking too much insulin/ other diabetes drugs.  Symptoms include tremors, sweating, hunger, confusion, headache, etc. Treat IMMEDIATELY with 15 grams of Carbs:   4 glucose tablets    cup regular juice/soda   2 tablespoons raisins   4 teaspoons sugar   1 tablespoon honey Recheck blood glucose in 15 mins and repeat above if still symptomatic/blood glucose <100. Please contact our office at [phone] if you have questions about how to next handle your insulin.  RECOMMENDATIONS TO REDUCE YOUR RISK OF DIABETIC COMPLICATIONS: * Take your prescribed MEDICATION(S). * Follow a DIABETIC diet: Complex carbs, fiber rich foods, heart healthy fish twice weekly, (monounsaturated and polyunsaturated) fats * AVOID saturated/trans fats, high fat foods, >2,300 mg salt per day. * EXERCISE at least 5 times a week for 30 minutes or preferably daily.  * DO NOT SMOKE OR DRINK more than 1 drink a day. * Check your FEET every day. Do not wear tightfitting shoes. Contact us if you develop an ulcer * See your EYE doctor once a year or more if needed * Get a FLU shot once a year * Get a PNEUMONIA vaccine once before and once after age 36 years  GOALS:  * Your Hemoglobin A1c of <7%  * fasting sugars need to be <130 * after meals sugars need to be <180 (2h after you start eating) * Your Systolic BP should be 140 or lower  * Your Diastolic BP should be 80 or lower  * Your HDL (Good Cholesterol) should be 40 or higher  *  Your LDL (Bad Cholesterol) should be 100 or lower  * Your Triglycerides should be 150 or lower  * Your Urine microalbumin (kidney function) should be <30 * Your Body Mass Index should be 25 or lower   We will be glad to help you achieve these goals. Our telephone number is: [phone].

## 2013-06-26 ENCOUNTER — Telehealth: Payer: Self-pay | Admitting: Internal Medicine

## 2013-06-26 NOTE — Telephone Encounter (Signed)
Having problems with blood pressure it is dropping very low, very dizzy, she has started Invokana and thinks this could be the issue. Please advise.

## 2013-06-27 ENCOUNTER — Telehealth: Payer: Self-pay | Admitting: *Deleted

## 2013-06-27 NOTE — Telephone Encounter (Signed)
Carollee Herter, please tell her to decrease to 1/2 dose and check with Dr Felicity Coyer if she can only take one Demadex tab a day (in her med list appears that she takes 2 a day). If she can, then increase back Invokana to 300 mg daily.

## 2013-06-27 NOTE — Telephone Encounter (Signed)
Pt called and stated that her bp is dropping very low, been very dizzy.She has started Invokana and thinks this could be the issue. Returned pt's call. Pt states her bp has been low since last Friday. Yesterday it was 90/50 (sitting) and 85/48 (lying down). Pt stated that she has had some light dizziness since beginning Invokana. Pt staid her bp was 100/60 on Sat and she was dizzy all day. Pt stated she is on diovan, metoprolol, lasix and a potassium supplement. Pt states she did not take the metoprolol yesterday because of her low bp; her pulse went up to 125. Pt states that her usual bp is 117/70. She is concerned and is unsure if this is the Invokana or something else. Please advise.

## 2013-06-27 NOTE — Telephone Encounter (Signed)
Lvm for pt to return call.

## 2013-06-29 ENCOUNTER — Ambulatory Visit (INDEPENDENT_AMBULATORY_CARE_PROVIDER_SITE_OTHER): Payer: BC Managed Care – PPO | Admitting: Internal Medicine

## 2013-06-29 ENCOUNTER — Encounter: Payer: Self-pay | Admitting: Internal Medicine

## 2013-06-29 VITALS — BP 130/82 | HR 90 | Temp 98.2°F | Resp 16 | Wt 305.0 lb

## 2013-06-29 DIAGNOSIS — IMO0002 Reserved for concepts with insufficient information to code with codable children: Secondary | ICD-10-CM

## 2013-06-29 DIAGNOSIS — M179 Osteoarthritis of knee, unspecified: Secondary | ICD-10-CM | POA: Insufficient documentation

## 2013-06-29 DIAGNOSIS — I10 Essential (primary) hypertension: Secondary | ICD-10-CM

## 2013-06-29 DIAGNOSIS — M1710 Unilateral primary osteoarthritis, unspecified knee: Secondary | ICD-10-CM

## 2013-06-29 MED ORDER — METHYLPREDNISOLONE ACETATE 40 MG/ML IJ SUSP
40.0000 mg | Freq: Once | INTRAMUSCULAR | Status: AC
  Administered 2013-06-29: 40 mg via INTRA_ARTICULAR

## 2013-06-29 MED ORDER — METHYLPREDNISOLONE ACETATE 40 MG/ML IJ SUSP: 40.0000 mg | Freq: Once | INTRAMUSCULAR | Status: DC

## 2013-06-29 NOTE — Patient Instructions (Signed)
Osteoarthritis Osteoarthritis is a disease that causes soreness and swelling (inflammation) of a joint. It occurs when the cartilage at the affected joint wears down. Cartilage acts as a cushion, covering the ends of bones where they meet to form a joint. Osteoarthritis is the most common form of arthritis. It often occurs in older people. The joints affected most often by this condition include those in the:  Ends of the fingers.  Thumbs.  Neck.  Lower back.  Knees.  Hips. CAUSES  Over time, the cartilage that covers the ends of bones begins to wear away. This causes bone to rub on bone, producing pain and stiffness in the affected joints.  RISK FACTORS Certain factors can increase your chances of having osteoarthritis, including:  Older age.  Excessive body weight.  Overuse of joints. SIGNS AND SYMPTOMS   Pain, swelling, and stiffness in the joint.  Over time, the joint may lose its normal shape.  Small deposits of bone (osteophytes) may grow on the edges of the joint.  Bits of bone or cartilage can break off and float inside the joint space. This may cause more pain and damage. DIAGNOSIS  Your health care provider will do a physical exam and ask about your symptoms. Various tests may be ordered, such as:  X-rays of the affected joint.  An MRI scan.  Blood tests to rule out other types of arthritis.  Joint fluid tests. This involves using a needle to draw fluid from the joint and examining the fluid under a microscope. TREATMENT  Goals of treatment are to control pain and improve joint function. Treatment plans may include:  A prescribed exercise program that allows for rest and joint relief.  A weight control plan.  Pain relief techniques, such as:  Properly applied heat and cold.  Electric pulses delivered to nerve endings under the skin (transcutaneous electrical nerve stimulation, TENS).  Massage.  Certain nutritional supplements.  Medicines to  control pain, such as:  Acetaminophen.  Nonsteroidal anti-inflammatory drugs (NSAIDs), such as naproxen.  Narcotic or central-acting agents, such as tramadol.  Corticosteroids. These can be given orally or as an injection.  Surgery to reposition the bones and relieve pain (osteotomy) or to remove loose pieces of bone and cartilage. Joint replacement may be needed in advanced states of osteoarthritis. HOME CARE INSTRUCTIONS   Only take over-the-counter or prescription medicines as directed by your health care provider. Take all medicines exactly as instructed.  Maintain a healthy weight. Follow your health care provider's instructions for weight control. This may include dietary instructions.  Exercise as directed. Your health care provider can recommend specific types of exercise. These may include:  Strengthening exercises These are done to strengthen the muscles that support joints affected by arthritis. They can be performed with weights or with exercise bands to add resistance.  Aerobic activities These are exercises, such as brisk walking or low-impact aerobics, that get your heart pumping.  Range-of-motion activities These keep your joints limber.  Balance and agility exercises These help you maintain daily living skills.  Rest your affected joints as directed by your health care provider.  Follow up with your health care provider as directed. SEEK MEDICAL CARE IF:   Your skin turns red.  You develop a rash in addition to your joint pain.  You have worsening joint pain. SEEK IMMEDIATE MEDICAL CARE IF:  You have a significant loss of weight or appetite.  You have a fever along with joint or muscle aches.  You have   night sweats. FOR MORE INFORMATION  National Institute of Arthritis and Musculoskeletal and Skin Diseases: www.niams.nih.gov National Institute on Aging: www.nia.nih.gov American College of Rheumatology: www.rheumatology.org Document Released: 04/13/2005  Document Revised: 02/01/2013 Document Reviewed: 12/19/2012 [employer] Patient Information 2014 [employer].  

## 2013-06-29 NOTE — Addendum Note (Signed)
Addended by: Rock Nephew T on: 06/29/2013 02:43 PM   Modules accepted: Orders

## 2013-06-29 NOTE — Progress Notes (Signed)
Pre visit review using our clinic review tool, if applicable. No additional management support is needed unless otherwise documented below in the visit note. 

## 2013-06-29 NOTE — Assessment & Plan Note (Signed)
She is having some episode of symptomatic hypotension so I have asked her to stop the loop diuretic but to continue the other meds for HTN

## 2013-06-29 NOTE — Progress Notes (Signed)
Subjective:    Patient ID: Heather Myers, female    DOB: [birth year], 48 y.o.   MRN: [id number]  Arthritis Presents for follow-up visit. The disease course has been stable. The condition has lasted for 2 years. She complains of pain. She reports no stiffness, joint swelling or joint warmth. Affected locations include the right knee. Her pain is at a severity of 2/10. Pertinent negatives include no diarrhea, dry eyes, dry mouth, dysuria, fatigue, fever, pain at night, pain while resting, rash, Raynaud's syndrome, uveitis or weight loss. Her past medical history is significant for osteoarthritis. Her pertinent risk factors include overuse. Past treatments include acetaminophen, an opioid and NSAIDs. The treatment provided mild relief. Factors aggravating her arthritis include activity. Compliance with prior treatments has been variable.      Review of Systems  Constitutional: Negative.  Negative for fever, chills, weight loss, diaphoresis, appetite change and fatigue.  HENT: Negative.   Eyes: Negative.   Respiratory: Negative.  Negative for cough, choking, chest tightness, shortness of breath, wheezing and stridor.   Cardiovascular: Negative for chest pain, palpitations and leg swelling.  Gastrointestinal: Negative.  Negative for nausea, vomiting, abdominal pain, diarrhea, constipation and blood in stool.  Endocrine: Negative.   Genitourinary: Negative.  Negative for dysuria.  Musculoskeletal: Positive for arthralgias and arthritis. Negative for back pain, joint swelling, myalgias, neck pain, neck stiffness and stiffness.  Skin: Negative.  Negative for color change, pallor and rash.  Allergic/Immunologic: Negative.   Neurological: Positive for dizziness and light-headedness. Negative for tremors, seizures, syncope, facial asymmetry, speech difficulty, weakness, numbness and headaches.       She has had a few episodes of orthostatic dizziness and lightheadedness and her SBP was down to 90 mmHG.   Hematological: Negative.  Negative for adenopathy. Does not bruise/bleed easily.  Psychiatric/Behavioral: Negative.        Objective:   Physical Exam  Vitals reviewed. Constitutional: She is oriented to person, place, and time. She appears well-developed and well-nourished. No distress.  HENT:  Head: Normocephalic and atraumatic.  Mouth/Throat: Oropharynx is clear and moist. No oropharyngeal exudate.  Eyes: Conjunctivae are normal. Right eye exhibits no discharge. Left eye exhibits no discharge. No scleral icterus.  Neck: Normal range of motion. Neck supple. No JVD present. No tracheal deviation present. No thyromegaly present.  Cardiovascular: Normal rate, regular rhythm, normal heart sounds and intact distal pulses.  Exam reveals no gallop and no friction rub.   No murmur heard. Pulmonary/Chest: Effort normal and breath sounds normal. No stridor. No respiratory distress. She has no wheezes. She has no rales. She exhibits no tenderness.  Abdominal: Soft. Bowel sounds are normal. She exhibits no distension and no mass. There is no tenderness. There is no rebound and no guarding.  Musculoskeletal: Normal range of motion. She exhibits no edema and no tenderness.       Right knee: She exhibits deformity (DJD changes). She exhibits normal range of motion, no swelling, no effusion, no ecchymosis, no laceration, no erythema, normal alignment, no LCL laxity, normal patellar mobility and no bony tenderness. No lateral joint line, no MCL, no LCL and no patellar tendon tenderness noted.  Rt knee, medial approach over the lower half of the patella. The area was cleaned with betadine and prepped and draped in sterile fashion. Local anesthesia was obtained with 2% lido with epi. Then the joint space was entered with the injection of 1/2 cc of plain 1% lido - 1 cc of plain 0.5% marcaine - and 40  mg of depo-medrol. She tolerated it well, no complications.  Lymphadenopathy:    She has no cervical adenopathy.   Neurological: She is oriented to person, place, and time.  Skin: Skin is warm and dry. No rash noted. She is not diaphoretic. No erythema. No pallor.  Psychiatric: She has a normal mood and affect. Her behavior is normal. Judgment and thought content normal.     Lab Results  Component Value Date   WBC 9.8 05/08/2013   HGB 12.3 05/08/2013   HCT 36.7 05/08/2013   PLT 581.0* 05/08/2013   GLUCOSE 215* 05/08/2013   CHOL 188 09/26/2012   TRIG 159.0* 09/26/2012   HDL 34.10* 09/26/2012   LDLCALC 122* 09/26/2012   ALT 17 05/08/2013   AST 24 05/08/2013   NA 137 05/08/2013   K 3.6 05/08/2013   CL 101 05/08/2013   CREATININE 0.7 05/08/2013   BUN 9 05/08/2013   CO2 27 05/08/2013   TSH 1.37 05/08/2013   HGBA1C 8.7* 05/08/2013   MICROALBUR 2.0* 09/26/2012       Assessment & Plan:

## 2013-06-29 NOTE — Telephone Encounter (Signed)
Called pt back again and lvm advising her per Dr Elvera Lennox, to decrease to 1/2 dose and check with Dr Felicity Coyer if she can only take one Demadex tab a day (in her med list appears that she takes 2 a day). If she can, then increase back Invokana to 300 mg daily. Advised pt to return my call and let me know that she got the message.

## 2013-07-07 ENCOUNTER — Ambulatory Visit: Payer: BC Managed Care – PPO | Admitting: Internal Medicine

## 2013-07-07 DIAGNOSIS — Z0289 Encounter for other administrative examinations: Secondary | ICD-10-CM

## 2013-07-13 ENCOUNTER — Other Ambulatory Visit: Payer: Self-pay | Admitting: Physician Assistant

## 2013-07-13 ENCOUNTER — Other Ambulatory Visit: Payer: BC Managed Care – PPO

## 2013-07-13 DIAGNOSIS — M25561 Pain in right knee: Secondary | ICD-10-CM

## 2013-07-22 ENCOUNTER — Ambulatory Visit
Admission: RE | Admit: 2013-07-22 | Discharge: 2013-07-22 | Disposition: A | Discharge status: Home / Self Care | Payer: BC Managed Care – PPO | Source: Ambulatory Visit | Attending: Physician Assistant | Admitting: Physician Assistant

## 2013-07-22 DIAGNOSIS — M25561 Pain in right knee: Secondary | ICD-10-CM

## 2013-07-24 ENCOUNTER — Other Ambulatory Visit: Payer: Self-pay | Admitting: Orthopaedic Surgery

## 2013-07-24 DIAGNOSIS — M25561 Pain in right knee: Secondary | ICD-10-CM

## 2013-07-29 ENCOUNTER — Other Ambulatory Visit: Payer: Self-pay | Admitting: Internal Medicine

## 2013-09-14 ENCOUNTER — Other Ambulatory Visit: Payer: Self-pay | Admitting: Internal Medicine

## 2013-09-29 ENCOUNTER — Other Ambulatory Visit: Payer: Self-pay | Admitting: Internal Medicine

## 2013-10-01 ENCOUNTER — Other Ambulatory Visit: Payer: Self-pay | Admitting: Internal Medicine

## 2013-10-07 ENCOUNTER — Other Ambulatory Visit: Payer: Self-pay | Admitting: Internal Medicine

## 2013-10-19 ENCOUNTER — Ambulatory Visit (INDEPENDENT_AMBULATORY_CARE_PROVIDER_SITE_OTHER): Payer: BC Managed Care – PPO | Admitting: Internal Medicine

## 2013-10-19 ENCOUNTER — Encounter: Payer: Self-pay | Admitting: Internal Medicine

## 2013-10-19 VITALS — BP 112/78 | HR 101 | Temp 98.3°F | Ht 65.0 in | Wt 306.0 lb

## 2013-10-19 DIAGNOSIS — I10 Essential (primary) hypertension: Secondary | ICD-10-CM

## 2013-10-19 DIAGNOSIS — R21 Rash and other nonspecific skin eruption: Secondary | ICD-10-CM

## 2013-10-19 MED ORDER — TRIAMCINOLONE ACETONIDE 0.1 % EX CREA
1.0000 | TOPICAL_CREAM | Freq: Two times a day (BID) | CUTANEOUS | Status: DC
Start: 2013-10-19 — End: 2015-04-18

## 2013-10-19 MED ORDER — LUSTRA-ULTRA 4 % EX CREA: TOPICAL_CREAM | CUTANEOUS | Status: DC

## 2013-10-19 MED ORDER — DOXYCYCLINE HYCLATE 100 MG PO TABS: 100.0000 mg | ORAL_TABLET | Freq: Two times a day (BID) | ORAL | Status: DC

## 2013-10-19 NOTE — Progress Notes (Signed)
Subjective:    Patient ID: Heather Myers, female    DOB: February 28, 1966, 48 y.o.   MRN: [id number]  HPI  Here to f/u, s/p right knee surgury x 7 wks without apparent complication, pain near resolved overall, ambulating ok, no fever , falls or other trauma, but now with rash to the patellar area with itchy painful red spots turning brownish.   Past Medical History  Diagnosis Date  . Diabetes mellitus type 2 in obese   . Hypertension   . Depression   . Anxiety   . Hyperlipidemia   . Anemia   . Allergic rhinitis   . Morbid obesity    Past Surgical History  Procedure Laterality Date  . Cholecystectomy    . Cesarean section      x's 2  . Rso    . 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 Abdominal surgery      chronic abd wall abscess  . Oophorectomy      R ovary removed at [facility]    reports that she has never smoked. She has never used smokeless tobacco. She reports that she does not drink alcohol or use illicit drugs. family history includes Diabetes in her mother and other. There is no history of Other. Allergies  Allergen Reactions  . Ramipril Shortness Of Breath   Current Outpatient Prescriptions on File Prior to Visit  Medication Sig Dispense Refill  . albuterol (PROAIR HFA) 108 (90 BASE) MCG/ACT inhaler Inhale 2 puffs into the lungs 4 (four) times daily.  18 g  2  . atorvastatin (LIPITOR) 10 MG tablet TAKE 1 TABLET EVERY DAY  90 tablet  0  . BAYER BREEZE 2 TEST DISK TEST BLOOD GLUCOSE UP TO 6 TIMES A DAY AS DIRECTED  200 each  5  . Canagliflozin (INVOKANA) 300 MG TABS Take 1 tablet (300 mg total) by mouth daily.  90 tablet  3  . citalopram (CELEXA) 40 MG tablet TAKE 1 TABLET BY MOUTH ONCE DAILY  30 tablet  1  . [facility] LANCETS THIN 26G MISC       . [facility] LANCETS THIN MISC Check 4x a day  400  each  3  . DIOVAN 320 MG tablet Take 1 tablet (320 mg total) by mouth daily.  30 tablet  11  . esomeprazole (NEXIUM) 20 MG packet Take 20 mg by mouth daily before breakfast.  30 each  0  . Famotidine (PEPCID AC PO) Take 1 tablet by mouth every 6 (six) hours as needed.      . fluticasone (FLONASE) 50 MCG/ACT nasal spray Place 2 sprays into the nose daily.  16 g  6  . fluticasone (FLOVENT HFA) 44 MCG/ACT inhaler Inhale 1 puff into the lungs 2 (two) times daily.  1 Inhaler  0  . insulin aspart (NOVOLOG) 100 UNIT/ML FlexPen Inject 25 Units into the skin 3 (three) times daily with meals.      . Insulin Glargine (LANTUS) 100 UNIT/ML Solostar Pen Inject 80 Units into the skin daily at 10 pm.      . Insulin Pen Needle 31G X 5 MM MISC Use 4x a day  400 each  3  . KLOR-CON M20 20 MEQ tablet TAKE 1 TABLET BY MOUTH ONCE DAILY  30 tablet  0  . Linaclotide Karlene Einstein)  290 MCG CAPS capsule Take 1 capsule (290 mcg total) by mouth daily.  90 capsule  1  . metFORMIN (GLUCOPHAGE-XR) 500 MG 24 hr tablet TAKE 2 TABLETS BY MOUTH TWICE DAILY  120 tablet  0  . metoprolol succinate (TOPROL-XL) 50 MG 24 hr tablet TAKE 1 TABLET BY MOUTH ONCE DAILY WITH OR IMMEDIATELY FOLLOWING A MEAL  30 tablet  1  . Multiple Vitamins-Minerals (MULTIVITAMIN WITH MINERALS) tablet Take 1 tablet by mouth daily.      . Pseudoephedrine HCl (SUDAFED 12 HOUR PO) Take 1 tablet by mouth every 6 (six) hours as needed.      . torsemide (DEMADEX) 20 MG tablet TAKE 1 TABLET BY MOUTH TWICE DAILY  60 tablet  1  . tranexamic acid (LYSTEDA) 650 MG TABS tablet Take 2 by mouth 3 times a day for 5 days (Not to exceed 5 days a month)  30 tablet  11  . sucralfate (CARAFATE) 1 G tablet Take 1 tablet (1 g total) by mouth 4 (four) times daily.  28 tablet  0   No current facility-administered medications on file prior to visit.   Review of Systems All otherwise neg per pt     Objective:   Physical Exam BP 112/78  Pulse 101  Temp(Src) 98.3 F (36.8 C) (Oral)   Ht 5\' 5"  (1.651 m)  Wt 306 lb (138.801 kg)  BMI 50.92 kg/m2  SpO2 99% VS noted,  Constitutional: Pt appears well-developed, well-nourished. /morbid obese HENT: Head: NCAT.  Right Ear: External ear normal.  Left Ear: External ear normal.  Eyes: . Pupils are equal, round, and reactive to light. Conjunctivae and EOM are normal Neck: Normal range of motion. Neck supple.  Cardiovascular: Normal rate and regular rhythm.   Pulmonary/Chest: Effort normal and breath sounds normal.  Neurological: Pt is alert. Not confused , motor grossly intact Skin: Skin is warm. Mult erythem tender lesions to right knee, without red streaks or drainage, several brownish healing spots noted as well Psychiatric: Pt behavior is normal. No agitation.     Assessment & Plan:

## 2013-10-19 NOTE — Patient Instructions (Signed)
Please take all new medication as prescribed  Please continue all other medications as before, and refills have been done if requested.  Please have the pharmacy call with any other refills you may need.  Please continue your efforts at being more active, low cholesterol diet, and weight control.  Please keep your appointments with your specialists as you may have planned    

## 2013-10-19 NOTE — Progress Notes (Signed)
Pre visit review using our clinic review tool, if applicable. No additional management support is needed unless otherwise documented below in the visit note. 

## 2013-10-22 NOTE — Assessment & Plan Note (Signed)
BP Readings from Last 3 Encounters:  10/19/13 112/78  06/29/13 130/82  06/05/13 118/62

## 2013-10-22 NOTE — Assessment & Plan Note (Signed)
Mild, doubt directly related to surgury, cant r/o folliculitis, for doxy course, triam cr prn,  to f/u any worsening symptoms or concerns

## 2013-11-01 ENCOUNTER — Telehealth: Payer: Self-pay | Admitting: *Deleted

## 2013-11-01 DIAGNOSIS — E1165 Type 2 diabetes mellitus with hyperglycemia: Principal | ICD-10-CM

## 2013-11-01 DIAGNOSIS — E1129 Type 2 diabetes mellitus with other diabetic kidney complication: Secondary | ICD-10-CM

## 2013-11-01 NOTE — Telephone Encounter (Signed)
Unable to reach patient because "voice mail is not set up" Message sent to mychart Lipid panel ordered Diabetic bundle

## 2013-11-02 ENCOUNTER — Telehealth: Payer: Self-pay

## 2013-11-02 NOTE — Telephone Encounter (Signed)
Hydroquinone-Sunscreen 4% crm. Has been discontinued, but Hydroquinone without sunscreen is available,  Please send in alternative to [facility]

## 2013-11-03 MED ORDER — HYDROQUINONE 4 % EX CREA: TOPICAL_CREAM | Freq: Two times a day (BID) | CUTANEOUS | Status: DC

## 2013-11-03 NOTE — Telephone Encounter (Signed)
Hydroquinone w/o sunscreen - erx done

## 2013-11-04 ENCOUNTER — Other Ambulatory Visit: Payer: Self-pay | Admitting: Internal Medicine

## 2013-11-10 ENCOUNTER — Ambulatory Visit: Payer: BC Managed Care – PPO | Admitting: Internal Medicine

## 2013-11-10 DIAGNOSIS — Z0289 Encounter for other administrative examinations: Secondary | ICD-10-CM

## 2013-11-16 ENCOUNTER — Ambulatory Visit (INDEPENDENT_AMBULATORY_CARE_PROVIDER_SITE_OTHER): Payer: BC Managed Care – PPO | Admitting: Physician Assistant

## 2013-11-16 ENCOUNTER — Encounter: Payer: Self-pay | Admitting: Physician Assistant

## 2013-11-16 VITALS — BP 120/80 | HR 72 | Temp 98.5°F | Resp 18 | Wt 301.0 lb

## 2013-11-16 DIAGNOSIS — E1165 Type 2 diabetes mellitus with hyperglycemia: Principal | ICD-10-CM

## 2013-11-16 DIAGNOSIS — E1129 Type 2 diabetes mellitus with other diabetic kidney complication: Secondary | ICD-10-CM

## 2013-11-16 LAB — BASIC METABOLIC PANEL
BUN: 10 mg/dL (ref 6–23)
CO2: 26 meq/L (ref 19–32)
CREATININE: 0.7 mg/dL (ref 0.4–1.2)
Calcium: 9.2 mg/dL (ref 8.4–10.5)
Chloride: 105 mEq/L (ref 96–112)
GFR: 119.78 mL/min (ref >60.00)
Glucose, Bld: 184 mg/dL — ABNORMAL HIGH (ref 70–99)
Potassium: 3.5 mEq/L (ref 3.5–5.1)
Sodium: 136 mEq/L (ref 135–145)

## 2013-11-16 LAB — LIPID PANEL
CHOL/HDL RATIO: 5
Cholesterol: 196 mg/dL (ref 0–200)
HDL: 35.8 mg/dL — ABNORMAL LOW (ref >39.00)
LDL Cholesterol: 126 mg/dL — ABNORMAL HIGH (ref 0–99)
NonHDL: 160.2
Triglycerides: 172 mg/dL — ABNORMAL HIGH (ref 0.0–149.0)
VLDL: 34.4 mg/dL (ref 0.0–40.0)

## 2013-11-16 LAB — MICROALBUMIN / CREATININE URINE RATIO
Creatinine,U: 41 mg/dL
MICROALB UR: 0.1 mg/dL (ref 0.0–1.9)
Microalb Creat Ratio: 0.2 mg/g (ref 0.0–30.0)

## 2013-11-16 LAB — HEMOGLOBIN A1C: Hgb A1c MFr Bld: 8 % — ABNORMAL HIGH (ref 4.6–6.5)

## 2013-11-16 MED ORDER — METFORMIN HCL ER 500 MG PO TB24: ORAL_TABLET | ORAL | Status: DC

## 2013-11-16 NOTE — Progress Notes (Signed)
Pre visit review using our clinic review tool, if applicable. No additional management support is needed unless otherwise documented below in the visit note. 

## 2013-11-16 NOTE — Assessment & Plan Note (Signed)
Blood sugars controlled well on medication. Invokana working well to reduce fingerstick bg. A1c on 05/08/13 was 8.7%. Will reassess. Pt needs ophthalmology referral for eye exam. Foot exam normal.

## 2013-11-16 NOTE — Patient Instructions (Addendum)
We have refilled your medication.  We will call you with the results of your lab work when available.  You will be called to schedule an appointment with an ophthalmologist.  If emergency symptoms discussed during visit developed, seek medical attention immediately.  Followup in about 3 months to reassess, or for worsening or persistent symptoms despite treatment.     Type 2 Diabetes Mellitus Type 2 diabetes mellitus is a long-term (chronic) disease. In type 2 diabetes:  The pancreas does not make enough of a hormone called insulin.  The cells in the body do not respond as well to the insulin that is made.  Both of the above can happen. Normally, insulin moves sugars from food into tissue cells. This gives you energy. If you have type 2 diabetes, sugars cannot be moved into tissue cells. This causes high blood sugar (hyperglycemia).  HOME CARE  Have your hemoglobin A1c level checked twice a year. The level shows if your diabetes is under control or out of control.  Test your blood sugar level every day as told by your doctor.  Check your ketone levels by testing your pee (urine) when you are sick and as told.  Take your diabetes or insulin medicine as told by your doctor.  Never run out of insulin.  Adjust how much insulin you give yourself based on how many carbs (carbohydrates) you eat. Carbs are in many foods, such as fruits, vegetables, whole grains, and dairy products.  Have a healthy snack between every healthy meal. Have 3 meals and 3 snacks a day.  Lose weight if you are overweight.  Carry a medical alert card or wear your medical alert jewelry.  Carry a 15-gram carb snack with you at all times. Examples include:  Glucose pills, 3 or 4.  Glucose gel, 15-gram tube.  Raisins, 2 tablespoons (24 grams).  Jelly beans, 6.  Animal crackers, 8.  Regular (not diet) pop, 4 ounces (120 milliliters).  Gummy treats, 9.  Notice low blood sugar (hypoglycemia)  symptoms, such as:  Shaking (tremors).  Trouble thinking clearly.  Sweating.  Faster heart rate.  Headache.  Dry mouth.  Hunger.  Crabbiness (irritability).  Being worried or tense (anxious).  Restless sleep.  A change in speech or coordination.  Confusion.  Treat low blood sugar right away. If you are alert and can swallow, follow the 15:15 rule:  Take 15-20 grams of a rapid-acting glucose or carb. This includes glucose gel, glucose pills, or 4 ounces (120 milliliters) of fruit juice, regular pop, or low-fat milk.  Check your blood sugar level 15 minutes after taking the glucose.  Take 15-20 grams more of glucose if the repeat blood sugar level is still 70 mg/dL (milligrams/deciliter) or below.  Eat a meal or snack within 1 hour of the blood sugar levels going back to normal.  Notice early symptoms of high blood sugar, such as:  Being really thirsty or drinking a lot (polydipsia).  Peeing a lot (polyuria).  Do at least 150 minutes of physical activity a week or as told.  Split the 150 minutes of activity up during the week. Do not do 150 minutes of activity in one day.  Perform exercises, such as weight lifting, at least 2 times a week or as told.  Spend no more than 90 minutes at one time inactive.  Adjust your insulin or food intake as needed if you start a new exercise or sport.  Follow your sick-day plan when you are not able  to eat or drink as usual.  Do not smoke, chew tobacco, or use electronic cigarettes.  Women who are not pregnant should drink no more than 1 drink a day. Men should drink no more than 2 drinks a day.  Only drink alcohol with food.  Ask your doctor if alcohol is safe for you.  Tell your doctor if you drink alcohol several times during the week.  See your doctor regularly.  Schedule an eye exam soon after you are told you have diabetes. Schedule exams once every year.  Check your skin and feet every day. Check for cuts,  bruises, redness, nail problems, bleeding, blisters, or sores. A doctor should do a foot exam once a year.  Brush your teeth and gums twice a day. Floss once a day. Visit your dentist regularly.  Share your diabetes plan with your workplace or school.  Stay up-to-date with shots that fight against diseases (immunizations).  Learn how to deal with stress.  Get diabetes education and support as needed.  Ask your doctor for special help if:  You need help to maintain or improve how you do things on your own.  You need help to maintain or improve the quality of your life.  You have foot or hand problems.  You have trouble cleaning yourself, dressing, eating, or doing physical activity. GET HELP IF:  You are unable to eat or drink for more than 6 hours.  You feel sick to your stomach (nauseous) or throw up (vomit) for more than 6 hours.  Your blood sugar level is over 240 mg/dL.  There is a change in mental status.  You get another serious illness.  You have watery poop (diarrhea) for more than 6 hours.  You have been sick or have had a fever for 2 or more days and are not getting better.  You have pain when you are active. GET HELP RIGHT AWAY IF:  You have trouble breathing.  Your ketone levels are higher than your doctor says they should be. MAKE SURE YOU:  Understand these instructions.  Will watch your condition.  Will get help right away if you are not doing well or get worse. Document Released: 01/21/2008 Document Revised: 08/28/2013 Document Reviewed: 11/13/2011 [facility] Patient Information 2015 [place], [place]. This information is not intended to replace advice given to you by your health care provider. Make sure you discuss any questions you have with your health care provider.

## 2013-11-16 NOTE — Progress Notes (Signed)
Subjective:    Patient ID: Heather Myers, female    DOB: Feb 07, 1966, 48 y.o.   MRN: [id number]  HPI Patient is a 48 y.o. female presenting for medication management of DM II. She takes metformin, invokana, Lantus 80cc, Novolog 18-20 units TID. Had been having elevated blood sugars despite extra insulin, was started on Invokana a few months ago, and this "worked like a miracle," and her blood sugars have been well controlled since. Pt states that her AM fasting blood sugars 115-125. Pt states that she is tolerating these medications very well, and denies adverse effects to medication.  Patient denies fevers, chills, nausea, vomiting, diarrhea, shortness of breath, chest pain, headache, syncope.   Review of Systems As per HPI and are otherwise negative.    Past Medical History  Diagnosis Date  . Diabetes mellitus type 2 in obese   . Hypertension   . Depression   . Anxiety   . Hyperlipidemia   . Anemia   . Allergic rhinitis   . Morbid obesity     History   Social History  . Marital Status: Married    Spouse Name: N/A    Number of Children: N/A  . Years of Education: N/A   Occupational History  . Not on file.   Social History Main Topics  . Smoking status: Never Smoker   . Smokeless tobacco: Never Used     Comment: Married  . Alcohol Use: No  . Drug Use: No  . Sexual Activity: Not Currently   Other Topics Concern  . Not on file   Social History Narrative  . No narrative on file    Past Surgical History  Procedure Laterality Date  . Cholecystectomy    . Cesarean section      x's 2  . Rso    . 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 Abdominal surgery      chronic abd wall abscess  . Oophorectomy      R ovary removed at [facility] History  Problem Relation Age  of Onset  . Diabetes Mother   . Diabetes Other   . Other Neg Hx     Allergies  Allergen Reactions  . Ramipril Shortness Of Breath    Current Outpatient Prescriptions on File Prior to Visit  Medication Sig Dispense Refill  . albuterol (PROAIR HFA) 108 (90 BASE) MCG/ACT inhaler Inhale 2 puffs into the lungs 4 (four) times daily.  18 g  2  . atorvastatin (LIPITOR) 10 MG tablet TAKE 1 TABLET EVERY DAY  90 tablet  0  . BAYER BREEZE 2 TEST DISK TEST BLOOD GLUCOSE UP TO 6 TIMES A DAY AS DIRECTED  200 each  5  . Canagliflozin (INVOKANA) 300 MG TABS Take 1 tablet (300 mg total) by mouth daily.  90 tablet  3  . citalopram (CELEXA) 40 MG tablet TAKE 1 TABLET BY MOUTH ONCE DAILY  30 tablet  1  . [facility] LANCETS THIN 26G MISC       . [facility] LANCETS THIN MISC Check 4x a day  400 each  3  . DIOVAN 320 MG tablet Take 1 tablet (320 mg total) by mouth daily.  30 tablet  11  . esomeprazole (NEXIUM) 20 MG packet Take 20 mg by mouth daily before  breakfast.  30 each  0  . Famotidine (PEPCID AC PO) Take 1 tablet by mouth every 6 (six) hours as needed.      . fluticasone (FLONASE) 50 MCG/ACT nasal spray Place 2 sprays into the nose daily.  16 g  6  . fluticasone (FLOVENT HFA) 44 MCG/ACT inhaler Inhale 1 puff into the lungs 2 (two) times daily.  1 Inhaler  0  . hydroquinone 4 % cream Apply topically 2 (two) times daily.  28.35 g  0  . insulin aspart (NOVOLOG) 100 UNIT/ML FlexPen Inject 25 Units into the skin 3 (three) times daily with meals.      . Insulin Glargine (LANTUS) 100 UNIT/ML Solostar Pen Inject 80 Units into the skin daily at 10 pm.      . Insulin Pen Needle 31G X 5 MM MISC Use 4x a day  400 each  3  . KLOR-CON M20 20 MEQ tablet TAKE 1 TABLET BY MOUTH ONCE DAILY  30 tablet  0  . Linaclotide (LINZESS) 290 MCG CAPS capsule Take 1 capsule (290 mcg total) by mouth daily.  90 capsule  1  . metFORMIN (GLUCOPHAGE-XR) 500 MG 24 hr tablet TAKE 2 TABLETS BY MOUTH TWICE DAILY  120 tablet  0  . metoprolol succinate  (TOPROL-XL) 50 MG 24 hr tablet TAKE 1 TABLET BY MOUTH ONCE DAILY WITH OR IMMEDIATELY FOLLOWING A MEAL  30 tablet  1  . Multiple Vitamins-Minerals (MULTIVITAMIN WITH MINERALS) tablet Take 1 tablet by mouth daily.      . Pseudoephedrine HCl (SUDAFED 12 HOUR PO) Take 1 tablet by mouth every 6 (six) hours as needed.      . torsemide (DEMADEX) 20 MG tablet TAKE 1 TABLET BY MOUTH TWICE DAILY  60 tablet  1  . tranexamic acid (LYSTEDA) 650 MG TABS tablet Take 2 by mouth 3 times a day for 5 days (Not to exceed 5 days a month)  30 tablet  11  . triamcinolone cream (KENALOG) 0.1 % Apply 1 application topically 2 (two) times daily.  30 g  0  . sucralfate (CARAFATE) 1 G tablet Take 1 tablet (1 g total) by mouth 4 (four) times daily.  28 tablet  0   No current facility-administered medications on file prior to visit.    EXAM: BP 120/80  Pulse 72  Temp(Src) 98.5 F (36.9 C) (Oral)  Resp 18  Wt 301 lb (136.533 kg)     Objective:   Physical Exam  Nursing note and vitals reviewed. Constitutional: She is oriented to person, place, and time. She appears well-developed and well-nourished. No distress.  HENT:  Head: Normocephalic and atraumatic.  Eyes: Conjunctivae and EOM are normal. Pupils are equal, round, and reactive to light.  Cardiovascular: Normal rate, regular rhythm and intact distal pulses.   Pulmonary/Chest: Effort normal and breath sounds normal. No respiratory distress. She exhibits no tenderness.  Musculoskeletal: She exhibits no edema.  Foot exam normal.  Neurological: She is alert and oriented to person, place, and time.  Skin: Skin is warm and dry. No rash noted. She is not diaphoretic. No erythema. No pallor.  Psychiatric: She has a normal mood and affect. Her behavior is normal. Judgment and thought content normal.    Lab Results  Component Value Date   WBC 9.8 05/08/2013   HGB 12.3 05/08/2013   HCT 36.7 05/08/2013   PLT 581.0* 05/08/2013   GLUCOSE 215* 05/08/2013   CHOL 188  09/26/2012   TRIG 159.0* 09/26/2012  HDL 34.10* 09/26/2012   LDLCALC 122* 09/26/2012   ALT 17 05/08/2013   AST 24 05/08/2013   NA 137 05/08/2013   K 3.6 05/08/2013   CL 101 05/08/2013   CREATININE 0.7 05/08/2013   BUN 9 05/08/2013   CO2 27 05/08/2013   TSH 1.37 05/08/2013   HGBA1C 8.7* 05/08/2013   MICROALBUR 2.0* 09/26/2012         Assessment & Plan:  Heather Myers was seen today for diabetes.  Diagnoses and associated orders for this visit:  Type II or unspecified type diabetes mellitus with renal manifestations, uncontrolled Comments: Blood sugar controlled well on medication. A1c on 05/08/13 was 8.7%. Will reassess. Pt needs ophthalmology referral for eye exam. Foot exam normal. - Microalbumin/Creatinine Ratio, Urine - Hemoglobin A1c - Basic Metabolic Panel - Lipid Panel - Ambulatory referral to Ophthalmology - metFORMIN (GLUCOPHAGE-XR) 500 MG 24 hr tablet; TAKE 2 TABLETS BY MOUTH TWICE DAILY    Pt doing well overall, needs labs redrawn and metformin refilled. Also needs annual eye exam, referring to ophthalmology.  Return precautions provided, and patient handout on DM II.  Plan to follow up in about 3 months to reassess, or for worsening or persistent symptoms despite treatment.  Patient Instructions  We have refilled your medication.  We will call you with the results of your lab work when available.  You will be called to schedule an appointment with an ophthalmologist.  If emergency symptoms discussed during visit developed, seek medical attention immediately.  Followup in about 3 months to reassess, or for worsening or persistent symptoms despite treatment.

## 2013-11-17 MED ORDER — INSULIN ASPART 100 UNIT/ML FLEXPEN: 25.0000 [IU] | PEN_INJECTOR | Freq: Three times a day (TID) | SUBCUTANEOUS | Status: DC

## 2013-11-17 MED ORDER — INSULIN GLARGINE 100 UNIT/ML SOLOSTAR PEN: 80.0000 [IU] | PEN_INJECTOR | Freq: Every day | SUBCUTANEOUS | Status: DC

## 2013-11-17 NOTE — Addendum Note (Signed)
Addended by: Azucena Freed on: 11/17/2013 01:36 PM   Modules accepted: Orders

## 2013-11-27 ENCOUNTER — Other Ambulatory Visit: Payer: Self-pay | Admitting: Internal Medicine

## 2013-11-27 ENCOUNTER — Telehealth: Payer: Self-pay | Admitting: Internal Medicine

## 2013-11-27 MED ORDER — DIOVAN 320 MG PO TABS: 320.0000 mg | ORAL_TABLET | Freq: Every day | ORAL | Status: DC

## 2013-11-27 MED ORDER — POTASSIUM CHLORIDE CRYS ER 20 MEQ PO TBCR: EXTENDED_RELEASE_TABLET | ORAL | Status: DC

## 2013-11-27 MED ORDER — METOPROLOL SUCCINATE ER 50 MG PO TB24: ORAL_TABLET | ORAL | Status: DC

## 2013-11-27 MED ORDER — INSULIN ASPART 100 UNIT/ML FLEXPEN: 25.0000 [IU] | PEN_INJECTOR | Freq: Three times a day (TID) | SUBCUTANEOUS | Status: DC

## 2013-11-27 MED ORDER — INSULIN GLARGINE 100 UNIT/ML SOLOSTAR PEN: 80.0000 [IU] | PEN_INJECTOR | Freq: Every day | SUBCUTANEOUS | Status: DC

## 2013-11-27 NOTE — Telephone Encounter (Signed)
Pt was notified will send enough until her apt...Heather Myers

## 2013-11-27 NOTE — Telephone Encounter (Signed)
Pt has an appt with Dr. Felicity Coyer 01/10/14. Pt was wondering if we can send refill in for: Diovan, metoprolol, Lantus, Novolog and potassium pill. Please advise[facility] [address]

## 2013-12-08 ENCOUNTER — Other Ambulatory Visit: Payer: Self-pay | Admitting: Internal Medicine

## 2014-01-10 ENCOUNTER — Ambulatory Visit: Payer: BC Managed Care – PPO | Admitting: Internal Medicine

## 2014-01-10 DIAGNOSIS — Z0289 Encounter for other administrative examinations: Secondary | ICD-10-CM

## 2014-01-11 ENCOUNTER — Ambulatory Visit: Payer: BC Managed Care – PPO | Admitting: Internal Medicine

## 2014-01-11 DIAGNOSIS — Z0289 Encounter for other administrative examinations: Secondary | ICD-10-CM

## 2014-01-18 ENCOUNTER — Encounter: Payer: Self-pay | Admitting: Internal Medicine

## 2014-01-18 ENCOUNTER — Ambulatory Visit (INDEPENDENT_AMBULATORY_CARE_PROVIDER_SITE_OTHER): Payer: BC Managed Care – PPO | Admitting: Internal Medicine

## 2014-01-18 ENCOUNTER — Other Ambulatory Visit (INDEPENDENT_AMBULATORY_CARE_PROVIDER_SITE_OTHER): Payer: BC Managed Care – PPO

## 2014-01-18 VITALS — BP 128/74 | HR 87 | Temp 98.2°F | Ht 65.0 in | Wt 297.2 lb

## 2014-01-18 DIAGNOSIS — E1129 Type 2 diabetes mellitus with other diabetic kidney complication: Secondary | ICD-10-CM

## 2014-01-18 DIAGNOSIS — N921 Excessive and frequent menstruation with irregular cycle: Secondary | ICD-10-CM

## 2014-01-18 DIAGNOSIS — E785 Hyperlipidemia, unspecified: Secondary | ICD-10-CM

## 2014-01-18 DIAGNOSIS — Z23 Encounter for immunization: Secondary | ICD-10-CM

## 2014-01-18 DIAGNOSIS — N920 Excessive and frequent menstruation with regular cycle: Secondary | ICD-10-CM

## 2014-01-18 DIAGNOSIS — E1165 Type 2 diabetes mellitus with hyperglycemia: Principal | ICD-10-CM

## 2014-01-18 DIAGNOSIS — Z1239 Encounter for other screening for malignant neoplasm of breast: Secondary | ICD-10-CM

## 2014-01-18 LAB — HEMOGLOBIN A1C: HEMOGLOBIN A1C: 7.9 % — AB (ref 4.6–6.5)

## 2014-01-18 MED ORDER — METOPROLOL SUCCINATE ER 50 MG PO TB24: 50.0000 mg | ORAL_TABLET | Freq: Every day | ORAL | Status: DC

## 2014-01-18 MED ORDER — ESOMEPRAZOLE MAGNESIUM 20 MG PO PACK: 20.0000 mg | PACK | Freq: Every day | ORAL | Status: DC

## 2014-01-18 MED ORDER — POTASSIUM CHLORIDE CRYS ER 20 MEQ PO TBCR: 20.0000 meq | EXTENDED_RELEASE_TABLET | Freq: Every day | ORAL | Status: DC

## 2014-01-18 MED ORDER — INSULIN PEN NEEDLE 31G X 5 MM MISC: Status: DC

## 2014-01-18 MED ORDER — ATORVASTATIN CALCIUM 10 MG PO TABS: 10.0000 mg | ORAL_TABLET | Freq: Every day | ORAL | Status: DC

## 2014-01-18 MED ORDER — INSULIN GLARGINE 100 UNIT/ML SOLOSTAR PEN: 80.0000 [IU] | PEN_INJECTOR | Freq: Every day | SUBCUTANEOUS | Status: DC

## 2014-01-18 MED ORDER — METFORMIN HCL ER 500 MG PO TB24
1000.0000 mg | ORAL_TABLET | Freq: Two times a day (BID) | ORAL | Status: DC
Start: 2014-01-18 — End: 2015-12-19

## 2014-01-18 MED ORDER — CITALOPRAM HYDROBROMIDE 40 MG PO TABS: 40.0000 mg | ORAL_TABLET | Freq: Every day | ORAL | Status: DC

## 2014-01-18 MED ORDER — INSULIN ASPART 100 UNIT/ML FLEXPEN: 25.0000 [IU] | PEN_INJECTOR | Freq: Three times a day (TID) | SUBCUTANEOUS | Status: DC

## 2014-01-18 MED ORDER — FLUTICASONE PROPIONATE HFA 44 MCG/ACT IN AERO: 1.0000 | INHALATION_SPRAY | Freq: Two times a day (BID) | RESPIRATORY_TRACT | Status: DC

## 2014-01-18 MED ORDER — TORSEMIDE 20 MG PO TABS: 20.0000 mg | ORAL_TABLET | Freq: Two times a day (BID) | ORAL | Status: DC

## 2014-01-18 MED ORDER — CVS LANCETS THIN 26G MISC: Status: DC

## 2014-01-18 MED ORDER — FLUTICASONE PROPIONATE 50 MCG/ACT NA SUSP: 2.0000 | Freq: Every day | NASAL | Status: DC

## 2014-01-18 MED ORDER — DIOVAN 320 MG PO TABS: 320.0000 mg | ORAL_TABLET | Freq: Every day | ORAL | Status: DC

## 2014-01-18 MED ORDER — CANAGLIFLOZIN 300 MG PO TABS: 1.0000 | ORAL_TABLET | Freq: Every day | ORAL | Status: DC

## 2014-01-18 NOTE — Assessment & Plan Note (Signed)
Wt Readings from Last 3 Encounters:  01/18/14 297 lb 4 oz (134.832 kg)  11/16/13 301 lb (136.533 kg)  10/19/13 306 lb (138.801 kg)   The patient is asked to make an attempt to improve diet and exercise patterns to aid in medical management of this problem.

## 2014-01-18 NOTE — Progress Notes (Signed)
Pre visit review using our clinic review tool, if applicable. No additional management support is needed unless otherwise documented below in the visit note. 

## 2014-01-18 NOTE — Progress Notes (Signed)
Subjective:    Patient ID: Heather Myers, female    DOB: 1966-03-15, 48 y.o.   MRN: [id number]  HPI  Patient is here for follow up  Reviewed chronic medical issues and interval medical events  Past Medical History  Diagnosis Date  . Diabetes mellitus type 2 in obese   . Hypertension   . Depression   . Anxiety   . Hyperlipidemia   . Anemia   . Allergic rhinitis   . Morbid obesity     Review of Systems  Constitutional: Negative for fatigue and unexpected weight change.  Respiratory: Negative for cough, shortness of breath and wheezing.   Cardiovascular: Negative for chest pain, palpitations and leg swelling.  Gastrointestinal: Negative for nausea, abdominal pain and diarrhea.  Genitourinary: Positive for menstrual problem (heavy bleeding despite IUD 03/2013 - requests new female gyn). Negative for dysuria.  Neurological: Negative for dizziness, weakness, light-headedness and headaches.  Psychiatric/Behavioral: Negative for dysphoric mood. The patient is not nervous/anxious.   All other systems reviewed and are negative.      Objective:   Physical Exam  BP 128/74  Pulse 87  Temp(Src) 98.2 F (36.8 C) (Oral)  Ht 5\' 5"  (1.651 m)  Wt 297 lb 4 oz (134.832 kg)  BMI 49.47 kg/m2  SpO2 98% Wt Readings from Last 3 Encounters:  01/18/14 297 lb 4 oz (134.832 kg)  11/16/13 301 lb (136.533 kg)  10/19/13 306 lb (138.801 kg)   Constitutional: She is MO, appears well-developed and well-nourished. No distress.  Neck: Thick. Normal range of motion. Neck supple. No JVD present. No thyromegaly present.  Cardiovascular: Distant. Normal rate, regular rhythm and normal heart sounds.  No murmur heard. No BLE edema. Pulmonary/Chest: Effort normal and breath sounds diminished at bases. No respiratory distress. She has no wheezes.  Psychiatric: She has a normal mood (but appropriately annoyed that I am >1h late). Her behavior is normal. Judgment and thought content normal.   Lab Results    Component Value Date   WBC 9.8 05/08/2013   HGB 12.3 05/08/2013   HCT 36.7 05/08/2013   PLT 581.0* 05/08/2013   GLUCOSE 184* 11/16/2013   CHOL 196 11/16/2013   TRIG 172.0* 11/16/2013   HDL 35.80* 11/16/2013   LDLCALC 126* 11/16/2013   ALT 17 05/08/2013   AST 24 05/08/2013   NA 136 11/16/2013   K 3.5 11/16/2013   CL 105 11/16/2013   CREATININE 0.7 11/16/2013   BUN 10 11/16/2013   CO2 26 11/16/2013   TSH 1.37 05/08/2013   HGBA1C 8.0* 11/16/2013   MICROALBUR 0.1 11/16/2013    Mr Knee Right Wo Contrast  07/22/2013   CLINICAL DATA:  Posterior right knee pain with movement. Swelling for 3 weeks. Injection 3 weeks ago.  EXAM: MRI OF THE RIGHT KNEE WITHOUT CONTRAST  TECHNIQUE: Multiplanar, multisequence MR imaging of the knee was performed. No intravenous contrast was administered.  COMPARISON:  MR EXTREM LOW JT*R* W/O CM dated 05/21/2009  FINDINGS: Study is mildly degraded by motion artifact.  MENISCI  Medial meniscus: Severe degenerative changes of the medial meniscus posterior horn root are present with extrusion of the medial meniscal body. Findings are compatible with loss of hoop strength. Progressive degenerative changes in the bone adjacent to the posterior horn root with a small cyst/intraosseous ganglion. Findings are consistent with an incomplete radial tear of the posterior horn root when taken together.  Lateral meniscus: Degenerative fraying of the free edge without a tear.  LIGAMENTS  Cruciates:  Intact.  Collaterals: Intact. Edema is present along the MCL associated with meniscal extrusion and altered mechanics.  CARTILAGE  Patellofemoral: Severe patellofemoral osteoarthritis with nearly completely denuded lateral patellar facet.  Medial:  Mild osteoarthritis.  No focal defects.  Lateral:  Mild osteoarthritis.  No focal defects.  Joint: Effusion. Synovitis is present in the lateral patellofemoral recess, likely reactive to lateral patellofemoral osteoarthritis.  Popliteal Fossa: Small cyst is present  adjacent to the posterior horn of the medial meniscus, favored to represent a parameniscal cyst.  Extensor Mechanism:  Intact.  Bones:  Tricompartmental osteophytes.  No aggressive osseous lesion.  IMPRESSION: 1. Progressive medial meniscal posterior horn degeneration with incomplete radial tear at the posterior horn root. Medial meniscal extrusion with inflammatory changes along the medial aspect of the joint and MCL. Small cyst along the posterior medial compartment is favored to represent parameniscal cyst. 2. 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 3. Effusion and synovitis.   Electronically Signed   By: Andreas Newport M.D.   On: 07/22/2013 16:56       Assessment & Plan:   Problem List Items Addressed This Visit   HYPERLIPIDEMIA     On atorva - Last lipids reviewed Check annually Diet and exercise counseling today    Relevant Medications      atorvastatin (LIPITOR) tablet      DIOVAN 320 MG tablet      metoprolol succinate (TOPROL-XL) 24 hr tablet      torsemide (DEMADEX) tablet   Menorrhagia     Refer to gyn for 2nd opinion per pt request as unhappy with IUD 03/2013    Morbid obesity      Wt Readings from Last 3 Encounters:  01/18/14 297 lb 4 oz (134.832 kg)  11/16/13 301 lb (136.533 kg)  10/19/13 306 lb (138.801 kg)   The patient is asked to make an attempt to improve diet and exercise patterns to aid in medical management of this problem.     Relevant Medications      Canagliflozin (INVOKANA) 300 MG TABS      insulin aspart (NOVOLOG) 100 UNIT/ML FlexPen      Insulin Glargine (LANTUS) 100 UNIT/ML Solostar Pen      metFORMIN (GLUCOPHAGE-XR) 24 hr tablet   Type II or unspecified type diabetes mellitus with renal manifestations, uncontrolled - Primary      Variable compliance, complicated by obesity and insulin resistance Started Invokana 04/2013 in addition to Novolog TID and Lantus- reports cbg improved, but still  180-200 On ARB and statin   Refer for annual eye exam and follow up with endo Derald Macleod) Lab Results  Component Value Date   HGBA1C 8.0* 11/16/2013      Relevant Medications      atorvastatin (LIPITOR) tablet      Canagliflozin (INVOKANA) 300 MG TABS      insulin aspart (NOVOLOG) 100 UNIT/ML FlexPen      Insulin Glargine (LANTUS) 100 UNIT/ML Solostar Pen      DIOVAN 320 MG tablet      metFORMIN (GLUCOPHAGE-XR) 24 hr tablet   Other Relevant Orders      Ambulatory referral to Ophthalmology      Hemoglobin A1c (Completed)    Other Visit Diagnoses   Other screening breast examination        Relevant Orders       MM DIGITAL SCREENING BILATERAL    Menometrorrhagia        Relevant Orders       Ambulatory  referral to Obstetrics / Gynecology    Need for prophylactic vaccination and inoculation against influenza        Relevant Orders       Flu Vaccine QUAD 36+ mos PF IM (Fluarix Quad PF) (Completed)

## 2014-01-18 NOTE — Assessment & Plan Note (Signed)
Refer to gyn for 2nd opinion per pt request as unhappy with IUD 03/2013

## 2014-01-18 NOTE — Patient Instructions (Signed)
It was good to see you today.  We have reviewed your prior records including labs and tests today  Your annual flu shot was given and/or updated today.  Test(s) ordered today (a1c). Your results will be released to MyChart (or called to you) after review, usually within 72hours after test completion. If any changes need to be made, you will be notified at that same time.  Medications reviewed and updated, no changes recommended at this time. Refill on medication(s) as discussed today.  we'll make referral to female gynecologist, eye specialist and for mammogram screening . Our office will contact you regarding appointment(s) once made.  Please schedule followup in 6 months for diabetes mellitus check and labs, call sooner if problems.

## 2014-01-18 NOTE — Assessment & Plan Note (Signed)
Variable compliance, complicated by obesity and insulin resistance Started Invokana 04/2013 in addition to Novolog TID and Lantus- reports cbg improved, but still 180-200 On ARB and statin   Refer for annual eye exam and follow up with endo Derald Macleod) Lab Results  Component Value Date   HGBA1C 8.0* 11/16/2013

## 2014-01-18 NOTE — Assessment & Plan Note (Signed)
On atorva - Last lipids reviewed Check annually Diet and exercise counseling today

## 2014-02-02 ENCOUNTER — Telehealth: Payer: Self-pay | Admitting: Internal Medicine

## 2014-02-02 NOTE — Telephone Encounter (Signed)
error 

## 2014-02-06 ENCOUNTER — Ambulatory Visit: Payer: BC Managed Care – PPO

## 2014-02-06 ENCOUNTER — Encounter: Payer: Self-pay | Admitting: Internal Medicine

## 2014-02-14 ENCOUNTER — Ambulatory Visit: Payer: BC Managed Care – PPO

## 2014-02-14 ENCOUNTER — Other Ambulatory Visit: Payer: Self-pay | Admitting: Internal Medicine

## 2014-02-14 DIAGNOSIS — Z1231 Encounter for screening mammogram for malignant neoplasm of breast: Secondary | ICD-10-CM

## 2014-02-26 ENCOUNTER — Encounter: Payer: Self-pay | Admitting: Internal Medicine

## 2014-03-05 ENCOUNTER — Other Ambulatory Visit: Payer: Self-pay | Admitting: Internal Medicine

## 2014-03-07 LAB — HM DIABETES EYE EXAM

## 2014-03-16 ENCOUNTER — Encounter: Payer: Self-pay | Admitting: Internal Medicine

## 2014-04-14 ENCOUNTER — Ambulatory Visit: Payer: BC Managed Care – PPO | Admitting: Family Medicine

## 2014-04-30 ENCOUNTER — Encounter: Payer: Self-pay | Admitting: Nurse Practitioner

## 2014-04-30 ENCOUNTER — Ambulatory Visit (INDEPENDENT_AMBULATORY_CARE_PROVIDER_SITE_OTHER): Payer: BLUE CROSS/BLUE SHIELD | Admitting: Nurse Practitioner

## 2014-04-30 ENCOUNTER — Other Ambulatory Visit: Payer: BLUE CROSS/BLUE SHIELD

## 2014-04-30 VITALS — BP 118/78 | HR 88 | Temp 98.2°F | Ht 65.0 in | Wt 301.0 lb

## 2014-04-30 DIAGNOSIS — R300 Dysuria: Secondary | ICD-10-CM

## 2014-04-30 LAB — POCT URINALYSIS DIPSTICK
Glucose, UA: 2
Ketones, UA: NEGATIVE
NITRITE UA: NEGATIVE
PH UA: 6
Spec Grav, UA: 1.015
UROBILINOGEN UA: 0.2

## 2014-04-30 MED ORDER — NITROFURANTOIN MONOHYD MACRO 100 MG PO CAPS: 100.0000 mg | ORAL_CAPSULE | Freq: Two times a day (BID) | ORAL | Status: DC

## 2014-04-30 NOTE — Progress Notes (Signed)
   Subjective:    Patient ID: Heather Myers, female    DOB: 1966-04-22, 49 y.o.   MRN: [id number]  Urinary Tract Infection  This is a new problem. The current episode started in the past 7 days (4d). The problem occurs every urination. The problem has been unchanged. The quality of the pain is described as burning. The pain is mild. There has been no fever. Associated symptoms include frequency and urgency. Pertinent negatives include no chills, discharge, flank pain, hematuria, hesitancy, nausea or vomiting. She has tried nothing for the symptoms. DM      Review of Systems  Constitutional: Negative for chills.  Gastrointestinal: Positive for abdominal pain (suprapubic pain). Negative for nausea, vomiting and diarrhea.  Genitourinary: Positive for dysuria, urgency and frequency. Negative for hesitancy, hematuria and flank pain.       Objective:   Physical Exam  Constitutional: She is oriented to person, place, and time. She appears well-developed and well-nourished. No distress.  HENT:  Head: Normocephalic and atraumatic.  Eyes: Conjunctivae are normal. Right eye exhibits no discharge. Left eye exhibits no discharge.  Neck: Normal range of motion. No thyromegaly present.  Cardiovascular: Normal rate.   Pulmonary/Chest: Effort normal. No respiratory distress.  Abdominal: Soft. She exhibits no distension and no mass. There is tenderness (RLQ & LLQ tender. ). There is no rebound and no guarding.  Exam difficult due to body habitus  Musculoskeletal: She exhibits no tenderness (no cva tenderness).  Lymphadenopathy:    She has no cervical adenopathy.  Neurological: She is alert and oriented to person, place, and time.  Skin: Skin is warm and dry.  Psychiatric: She has a normal mood and affect. Her behavior is normal. Thought content normal.  Vitals reviewed.         Assessment & Plan:  1. Dysuria - Urine culture - POCT urinalysis dipstick-trace leuks - nitrofurantoin,  macrocrystal-monohydrate, (MACROBID) 100 MG capsule; Take 1 capsule (100 mg total) by mouth 2 (two) times daily.  Dispense: 10 capsule; Refill: 0

## 2014-04-30 NOTE — Progress Notes (Signed)
Pre visit review using our clinic review tool, if applicable. No additional management support is needed unless otherwise documented below in the visit note. 

## 2014-04-30 NOTE — Patient Instructions (Signed)
Start antibiotic. Our office will call if we need to change the antibiotic. Sip hydrating fluids (water, juice, colorless soda, decaff tea) every hour to flush kidneys. Return in 1 month to recheck urine or sooner if symptoms do not improve or you feel worse.  Urinary Tract Infection Urinary tract infections (UTIs) can develop anywhere along your urinary tract. Your urinary tract is your body's drainage system for removing wastes and extra water. Your urinary tract includes two kidneys, two ureters, a bladder, and a urethra. Your kidneys are a pair of bean-shaped organs. Each kidney is about the size of your fist. They are located below your ribs, one on each side of your spine. CAUSES Infections are caused by microbes, which are microscopic organisms, including fungi, viruses, and bacteria. These organisms are so small that they can only be seen through a microscope. Bacteria are the microbes that most commonly cause UTIs. SYMPTOMS  Symptoms of UTIs may vary by age and gender of the patient and by the location of the infection. Symptoms in young women typically include a frequent and intense urge to urinate and a painful, burning feeling in the bladder or urethra during urination. Older women and men are more likely to be tired, shaky, and weak and have muscle aches and abdominal pain. A fever may mean the infection is in your kidneys. Other symptoms of a kidney infection include pain in your back or sides below the ribs, nausea, and vomiting. DIAGNOSIS To diagnose a UTI, your caregiver will ask you about your symptoms. Your caregiver also will ask to provide a urine sample. The urine sample will be tested for bacteria and white blood cells. White blood cells are made by your body to help fight infection. TREATMENT  Typically, UTIs can be treated with medication. Because most UTIs are caused by a bacterial infection, they usually can be treated with the use of antibiotics. The choice of antibiotic and  length of treatment depend on your symptoms and the type of bacteria causing your infection. HOME CARE INSTRUCTIONS  If you were prescribed antibiotics, take them exactly as your caregiver instructs you. Finish the medication even if you feel better after you have only taken some of the medication.  Drink enough water and fluids to keep your urine clear or pale yellow.  Avoid caffeine, tea, and carbonated beverages. They tend to irritate your bladder.  Empty your bladder often. Avoid holding urine for long periods of time.  Empty your bladder before and after sexual intercourse.  After a bowel movement, women should cleanse from front to back. Use each tissue only once. SEEK MEDICAL CARE IF:   You have back pain.  You develop a fever.  Your symptoms do not begin to resolve within 3 days. SEEK IMMEDIATE MEDICAL CARE IF:   You have severe back pain or lower abdominal pain.  You develop chills.  You have nausea or vomiting.  You have continued burning or discomfort with urination. MAKE SURE YOU:   Understand these instructions.  Will watch your condition.  Will get help right away if you are not doing well or get worse. Document Released: 01/21/2005 Document Revised: 10/13/2011 Document Reviewed: 05/22/2011 [employer] Patient Information 2014 [employer].  

## 2014-05-01 LAB — URINE CULTURE: Colony Count: 50000

## 2014-05-10 ENCOUNTER — Telehealth: Payer: Self-pay | Admitting: Internal Medicine

## 2014-05-10 DIAGNOSIS — R300 Dysuria: Secondary | ICD-10-CM

## 2014-05-10 NOTE — Telephone Encounter (Signed)
Spoke to pt. Pt stated that they are still having the same sx.  Order entered for UA and culture.

## 2014-05-10 NOTE — Telephone Encounter (Signed)
Patient states that she does not believe that the nitrofurantoin, macrocrystal-monohydrate, (MACROBID) 100 MG capsule [[id number]] is not strong enough based on her recent lab results.

## 2014-05-10 NOTE — Telephone Encounter (Signed)
I do not understand patient's specific concern. Urine culture was nonspecific, suggestive of dirty catch -not a true urinary tract infection  If patient is still having burning and urinary tract symptoms, despite completion of the Macrobid prescription, we should repeat the urinalysis and culture to obtain a clean specimen and result Okay to enter order for same if needed  If patient's symptoms have resolved, no alternative therapy or further workup is required

## 2014-07-08 ENCOUNTER — Other Ambulatory Visit: Payer: Self-pay | Admitting: Internal Medicine

## 2014-07-19 ENCOUNTER — Other Ambulatory Visit: Payer: Self-pay | Admitting: Family

## 2014-07-19 ENCOUNTER — Other Ambulatory Visit: Payer: Self-pay | Admitting: Internal Medicine

## 2014-07-19 MED ORDER — INSULIN ASPART 100 UNIT/ML FLEXPEN: 25.0000 [IU] | PEN_INJECTOR | Freq: Three times a day (TID) | SUBCUTANEOUS | Status: DC

## 2014-07-25 ENCOUNTER — Other Ambulatory Visit: Payer: Self-pay | Admitting: Internal Medicine

## 2014-07-25 ENCOUNTER — Telehealth: Payer: Self-pay | Admitting: Internal Medicine

## 2014-07-25 NOTE — Telephone Encounter (Signed)
Patient reqeusting refill of   metFORMIN (GLUCOPHAGE-XR) 500 MG 24 hr tablet [[id number]]      [facility] [address]

## 2014-07-25 NOTE — Telephone Encounter (Signed)
Rx has already been responded to.   Pt informed rx [facility]d x 1 [facility] [address]

## 2014-08-26 ENCOUNTER — Other Ambulatory Visit: Payer: Self-pay | Admitting: Internal Medicine

## 2014-10-22 ENCOUNTER — Other Ambulatory Visit: Payer: Self-pay

## 2014-10-31 ENCOUNTER — Telehealth: Payer: Self-pay | Admitting: Internal Medicine

## 2014-10-31 MED ORDER — METOPROLOL SUCCINATE ER 50 MG PO TB24: 50.0000 mg | ORAL_TABLET | Freq: Every day | ORAL | Status: DC

## 2014-10-31 NOTE — Telephone Encounter (Signed)
Pt called in said that her refill was denied formetoprolol succinate (TOPROL-XL) 50 MG 24 hr tablet [[id number]]  She wanted to know why and if [facility]?      Best number [phone]

## 2014-10-31 NOTE — Telephone Encounter (Signed)
erx done

## 2014-12-14 ENCOUNTER — Other Ambulatory Visit: Payer: Self-pay | Admitting: Family

## 2014-12-18 ENCOUNTER — Telehealth: Payer: Self-pay | Admitting: Internal Medicine

## 2014-12-18 MED ORDER — VALSARTAN 320 MG PO TABS: 320.0000 mg | ORAL_TABLET | Freq: Every day | ORAL | Status: DC

## 2014-12-18 NOTE — Telephone Encounter (Signed)
erx done

## 2014-12-18 NOTE — Telephone Encounter (Signed)
Pt called requesting that we send new rx [facility] [address]. It is currently written for the name brand but she needs generic.

## 2015-01-10 ENCOUNTER — Other Ambulatory Visit: Payer: Self-pay | Admitting: Internal Medicine

## 2015-01-28 ENCOUNTER — Other Ambulatory Visit: Payer: Self-pay | Admitting: Internal Medicine

## 2015-02-06 ENCOUNTER — Other Ambulatory Visit: Payer: Self-pay | Admitting: Internal Medicine

## 2015-02-20 ENCOUNTER — Other Ambulatory Visit: Payer: Self-pay | Admitting: Internal Medicine

## 2015-03-08 ENCOUNTER — Other Ambulatory Visit: Payer: Self-pay | Admitting: Internal Medicine

## 2015-03-11 ENCOUNTER — Telehealth: Payer: Self-pay | Admitting: Internal Medicine

## 2015-03-12 MED ORDER — TORSEMIDE 20 MG PO TABS: 20.0000 mg | ORAL_TABLET | Freq: Two times a day (BID) | ORAL | Status: DC

## 2015-03-12 NOTE — Telephone Encounter (Signed)
erx done

## 2015-03-12 NOTE — Telephone Encounter (Signed)
I have scheduled her with Dr. Yetta Barre on Monday. Can you go ahead and refill the prescription because she is totally out.

## 2015-03-12 NOTE — Addendum Note (Signed)
Addended by: Verlan Friends on: 03/12/2015 05:14 PM   Modules accepted: Orders

## 2015-03-13 NOTE — Telephone Encounter (Signed)
lmovm

## 2015-03-18 ENCOUNTER — Ambulatory Visit: Payer: BLUE CROSS/BLUE SHIELD | Admitting: Internal Medicine

## 2015-03-18 DIAGNOSIS — Z0289 Encounter for other administrative examinations: Secondary | ICD-10-CM

## 2015-04-03 ENCOUNTER — Other Ambulatory Visit: Payer: Self-pay | Admitting: Internal Medicine

## 2015-04-03 NOTE — Telephone Encounter (Signed)
Not able to refill unless pt makes an appt. Will you call for me pls?

## 2015-04-03 NOTE — Telephone Encounter (Signed)
Left vm for patient to call to schedule appointment

## 2015-04-11 NOTE — Telephone Encounter (Signed)
Left patient second vm to schedule appointment with a provider in the office in order to refill medication.

## 2015-04-17 NOTE — Telephone Encounter (Signed)
[facility] - Client TELEPHONE ADVICE RECORD   [facility]    --------------------------------------------------------------------------------   Patient Name: Heather Myers  Gender: Female  DOB: [birth year]   Age: 49 Y 5 M 13 D  Return Phone Number: [phone] (Primary)  Address:     City/State/Zip:  [place]     Client [facility] - Client  Client Site [facility] Day  Physician Rene Paci   Contact Type Call  Call Type Triage / Clinical  Relationship To Patient Self  Appointment Disposition EMR Caller Not Reached  Info pasted into Epic Yes  Return Phone Number [phone] (Primary)  Chief Complaint Rash - Widespread  Initial Comment Severe allergic rxn, rash on stomach and legs, taken topical cream and Benedryl but not helping       Nurse Assessment       Guidelines          Guideline Title Affirmed Question Affirmed Notes Nurse Date/Time (Eastern Time)       Disp. Time [name] Time) Disposition Final User    04/17/2015 4:30:18 PM Attempt made - no message left   Harlon Flor, RN, Darl Pikes      04/17/2015 4:39:10 PM FINAL ATTEMPT MADE - no message left Yes Harlon Flor, RN, Darl Pikes

## 2015-04-18 ENCOUNTER — Encounter: Payer: Self-pay | Admitting: Internal Medicine

## 2015-04-18 ENCOUNTER — Ambulatory Visit (INDEPENDENT_AMBULATORY_CARE_PROVIDER_SITE_OTHER): Payer: BLUE CROSS/BLUE SHIELD | Admitting: Internal Medicine

## 2015-04-18 VITALS — BP 128/64 | HR 96 | Temp 98.4°F | Ht 65.0 in | Wt 291.2 lb

## 2015-04-18 DIAGNOSIS — L309 Dermatitis, unspecified: Secondary | ICD-10-CM | POA: Diagnosis not present

## 2015-04-18 DIAGNOSIS — Z794 Long term (current) use of insulin: Secondary | ICD-10-CM

## 2015-04-18 DIAGNOSIS — E119 Type 2 diabetes mellitus without complications: Secondary | ICD-10-CM | POA: Diagnosis not present

## 2015-04-18 LAB — COMPREHENSIVE METABOLIC PANEL
ALBUMIN: 3.8 g/dL (ref 3.5–5.2)
ALT: 13 U/L (ref 0–35)
AST: 19 U/L (ref 0–37)
Alkaline Phosphatase: 120 U/L — ABNORMAL HIGH (ref 39–117)
BUN: 13 mg/dL (ref 6–23)
CALCIUM: 9.3 mg/dL (ref 8.4–10.5)
CHLORIDE: 101 meq/L (ref 96–112)
CO2: 25 mEq/L (ref 19–32)
CREATININE: 0.71 mg/dL (ref 0.40–1.20)
GFR: 113.26 mL/min (ref >60.00)
Glucose, Bld: 156 mg/dL — ABNORMAL HIGH (ref 70–99)
Potassium: 3.2 mEq/L — ABNORMAL LOW (ref 3.5–5.1)
Sodium: 135 mEq/L (ref 135–145)
Total Bilirubin: 0.3 mg/dL (ref 0.2–1.2)
Total Protein: 8.2 g/dL (ref 6.0–8.3)

## 2015-04-18 MED ORDER — BETAMETHASONE DIPROPIONATE AUG 0.05 % EX CREA: TOPICAL_CREAM | Freq: Two times a day (BID) | CUTANEOUS | Status: DC

## 2015-04-18 MED ORDER — TRIAMCINOLONE ACETONIDE 0.1 % EX CREA: 1.0000 "application " | TOPICAL_CREAM | Freq: Two times a day (BID) | CUTANEOUS | Status: DC

## 2015-04-18 NOTE — Progress Notes (Signed)
Pre visit review using our clinic review tool, if applicable. No additional management support is needed unless otherwise documented below in the visit note. 

## 2015-04-18 NOTE — Progress Notes (Signed)
Subjective:    Patient ID: Heather Myers, female    DOB: 09-16-1965, 49 y.o.   MRN: [id number]  DOS:  04/18/2015 Type of visit - description : Acute visit Interval history: Here for evaluation of rash: She used a right knee brace 2 weeks ago and developed a local rash which is not a new problem however later on she developed mild rash on the back, arms; rash somewhat Itchy. OTC Benadryl and Cortaid not helping   Review of Systems   no fever chills, no whelps  Past Medical History  Diagnosis Date  . Diabetes mellitus type 2 in obese (HCC)   . Hypertension   . Depression   . Anxiety   . Hyperlipidemia   . Anemia   . Allergic rhinitis   . Morbid obesity [facility])     Past Surgical History  Procedure Laterality Date  . Cholecystectomy    . Cesarean section      x's 2  . Rso    . 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 Abdominal surgery      chronic abd wall abscess  . Oophorectomy      R ovary removed at [facility]    Social History   Social History  . Marital Status: Married    Spouse Name: N/A  . Number of Children: N/A  . Years of Education: N/A   Occupational History  . Not on file.   Social History Main Topics  . Smoking status: Never Smoker   . Smokeless tobacco: Never Used     Comment: Married  . Alcohol Use: No  . Drug Use: No  . Sexual Activity: Not Currently   Other Topics Concern  . Not on file   Social History Narrative        Medication List       This list is accurate as of: 04/18/15 11:59 PM.  Always use your most recent med list.               albuterol 108 (90 BASE) MCG/ACT inhaler  Commonly known as:  PROAIR HFA  Inhale 2 puffs into the lungs 4 (four) times daily.     atorvastatin 10 MG tablet  Commonly known as:  LIPITOR  Take 1 tablet  (10 mg total) by mouth daily. Need annual exam for additional refills     augmented betamethasone dipropionate 0.05 % cream  Commonly known as:  DIPROLENE-AF  Apply topically 2 (two) times daily.     canagliflozin 300 MG Tabs tablet  Commonly known as:  INVOKANA  Take 300 mg by mouth daily.     citalopram 40 MG tablet  Commonly known as:  CELEXA  Take 1 tablet (40 mg total) by mouth daily. Appointment needed for additional refills     CVS LANCETS THIN Misc  Check 4x a day     CVS LANCETS THIN 26G Misc  As directed     esomeprazole 20 MG packet  Commonly known as:  NEXIUM  Take 20 mg by mouth daily before breakfast.     fluticasone 44 MCG/ACT inhaler  Commonly known as:  FLOVENT HFA  Inhale 1 puff into the lungs 2 (two) times daily. Need annual exam for additional refills     fluticasone 50 MCG/ACT nasal spray  Commonly known  as:  FLONASE  Place 2 sprays into both nostrils daily.     Glucose Blood Disk  Commonly known as:  BAYER BREEZE 2 TEST  Use to check blood sugars up to 6 times a day Dx E11.9     hydroquinone 4 % cream  Apply topically 2 (two) times daily.     Insulin Pen Needle 31G X 5 MM Misc  Use 4x a day     LANTUS SOLOSTAR 100 UNIT/ML Solostar Pen  Generic drug:  Insulin Glargine  INJECT 80 UNITS INTO THE SKIN DAILY AT 10 PM.     Linaclotide 290 MCG Caps capsule  Commonly known as:  LINZESS  Take 1 capsule (290 mcg total) by mouth daily.     metFORMIN 500 MG 24 hr tablet  Commonly known as:  GLUCOPHAGE-XR  Take 2 tablets (1,000 mg total) by mouth 2 (two) times daily.     metFORMIN 500 MG 24 hr tablet  Commonly known as:  GLUCOPHAGE-XR  Take 1 tablet (500 mg total) by mouth 2 (two) times daily. Need annual exam for additional refills     metoprolol succinate 50 MG 24 hr tablet  Commonly known as:  TOPROL-XL  Take 1 tablet (50 mg total) by mouth daily. Need annual exam for additional refills     multivitamin with minerals tablet  Take 1 tablet by  mouth daily.     NOVOLOG FLEXPEN 100 UNIT/ML FlexPen  Generic drug:  insulin aspart  INJECT 25 UNITS INTO THE SKIN 3 (THREE) TIMES DAILY WITH MEALS.     PEPCID AC PO  Take 1 tablet by mouth every 6 (six) hours as needed.     potassium chloride SA 20 MEQ tablet  Commonly known as:  KLOR-CON M20  Take 1 tablet (20 mEq total) by mouth daily.     sucralfate 1 G tablet  Commonly known as:  CARAFATE  Take 1 tablet (1 g total) by mouth 4 (four) times daily.     SUDAFED 12 HOUR PO  Take 1 tablet by mouth every 6 (six) hours as needed.     torsemide 20 MG tablet  Commonly known as:  DEMADEX  Take 1 tablet (20 mg total) by mouth 2 (two) times daily.     tranexamic acid 650 MG Tabs tablet  Commonly known as:  LYSTEDA  Take 2 by mouth 3 times a day for 5 days (Not to exceed 5 days a month)     triamcinolone cream 0.1 %  Commonly known as:  KENALOG  Apply 1 application topically 2 (two) times daily.     valsartan 320 MG tablet  Commonly known as:  DIOVAN  Take 1 tablet (320 mg total) by mouth daily.           Objective:   Physical Exam BP 128/64 mmHg  Pulse 96  Temp(Src) 98.4 F (36.9 C) (Oral)  Ht 5\' 5"  (1.651 m)  Wt 291 lb 4 oz (132.11 kg)  BMI 48.47 kg/m2  SpO2 97% General:   Well developed, well nourished . NAD.  HEENT:  Normocephalic . Face symmetric, atraumatic   Skin:  Around the right knee she has a dry, slightly scaly, patchy rash, keratosis pilaris? At the abdomen and arms is he actually has only one or 2 slightly red, elevated lesions. Wrists and interdigital area normal. Neurologic:  alert & oriented X3.  Speech normal, gait appropriate for age and unassisted Psych--  Cognition and judgment appear intact.  Cooperative with normal  attention span and concentration.  Behavior appropriate. No anxious or depressed appearing.      Assessment & Plan:   Dermatitis: Contact dermatitis around the knee;  at the abdomen and arms she actually has only one or 2  lesions, dx unclear; recommend Kenalog and diprolene as needed. Diabetes, hypertension: Has not seen a doctor in many months, last lab 10-2013. Strongly recommend to see a M.D. ASAP; to be sure her renal function, potassium and liver are okay will check a CMP. Further advice by a new PCP or endocrinology.

## 2015-04-18 NOTE — Patient Instructions (Addendum)
Before you leave the office:  GO TO THE LAB  and get the blood work      After you leave the office:  Use a lotion triamcinolone  twice a day For the areas that are more itchy, use diprolene Call if no better  It is extremely important you see either Dr Yetta Barre or Dr Elvera Lennox, please call them and make an appointment

## 2015-04-19 ENCOUNTER — Other Ambulatory Visit: Payer: Self-pay | Admitting: Internal Medicine

## 2015-05-02 ENCOUNTER — Other Ambulatory Visit: Payer: Self-pay | Admitting: Internal Medicine

## 2015-05-02 ENCOUNTER — Telehealth: Payer: Self-pay | Admitting: Internal Medicine

## 2015-05-02 NOTE — Telephone Encounter (Signed)
Pt request to transfer from Dr. Felicity Coyer to Dr. Yetta Barre. Please advise.   Pt request to speak to the assistant concern about refill for torsemide (DEMADEX) 20 MG tablet , LANTUS SOLOSTAR 100 UNIT/ML Solostar Pen and KLOR-CON M20 20 MEQ tablet. Pt really need this refill and not sure why the doctor will not able to fill this med. Please help

## 2015-05-02 NOTE — Telephone Encounter (Signed)
Pt has not been seen recently to give refills. We have tried to call. Pt is difficult to reach. We have tried to call on several occassions to schedule an appt with her. Not able to refill without authorization.

## 2015-05-02 NOTE — Telephone Encounter (Signed)
[facility]  Patient Name: Heather Myers  DOB: 1966-02-06    Initial Comment Caller states She is out of her medication; went to [place] and needs authorization; Legs are swelling   Nurse Assessment  Nurse: Elwyn Lade, RN, Misty Stanley Date/Time (Eastern Time): 05/02/2015 2:32:02 PM  Confirm and document reason for call. If symptomatic, describe symptoms. ---Caller states She is out of her medication; went to [place] 2 wks ago and K+ was low and was told to increase double K+ for 5 days; Legs are swelling; out of [place] Con,Lantus insulin flexpen, furosemide, ran out Tues night; needs appt for full physical and MD has left the practice  Has the patient traveled out of the country within the last 30 days? ---Not Applicable  Does the patient have any new or worsening symptoms? ---Yes  Will a triage be completed? ---Yes  Related visit to physician within the last 2 weeks? ---Yes  Does the PT have any chronic conditions? (i.e. diabetes, asthma, etc.) ---Yes  List chronic conditions. ---diabetes, HTN  Did the patient indicate they were pregnant? ---No  Is this a behavioral health or substance abuse call? ---No     Guidelines    Guideline Title Affirmed Question Affirmed Notes  Leg Swelling and Edema [1] Thigh, calf, or ankle swelling AND [2] only 1 side    Final Disposition User   See Physician within 4 Hours (or PCP triage) Burress, RN, Misty Stanley    Comments  CALLER WANTS TO BE SEEN TOMORROW AND WANTS MEDS TO LAST UNTIL MONDAY IF SHE HAS TO WAIT UNTIL THEN; OFFERED TELEHEALTH AND [facility], BUT DECLINED; SPOKE WITH TAMARA AT OFFICE TO REPORT AND WILL CALL PT BACK; TAMARA STATES THEY HAVE BEEN TRYING TO REACH HER TO SCHEDULE APPT AND GOT NO ANSWER   Referrals  GO TO FACILITY OTHER - SPECIFY   Disagree/Comply: Disagree  Disagree/Comply Reason: Disagree with instructions

## 2015-05-02 NOTE — Telephone Encounter (Signed)
Please read message below. Pt has appt with you tomorrow at 9:15 am.

## 2015-05-02 NOTE — Telephone Encounter (Signed)
rf at 05/03/2015 appt for Lantus, K+ and torsemide? Pt has been out for some time.   Additional detail below:  PCP is no longer a provider for [place]. Pt only wanted to transfer to Dr. Yetta Barre.  Dr. Yetta Barre stated that he is not able to take pt. Pt wants to transfer to the [place] office.   Mistake was made when scheduling and almost scheduled appt with Clent Ridges instead of [place].  Called [place] and explained what had happened. Was told appt with NP/PA could be made to fill meds. Then pt could have an appt to sched/est with a new provider.  Called Endo about med fill until Cory/Endo appt coming up. Judeth Cornfield was able to get pt in for 9:15am tomorrow. Pt agreed. Pt will also keep the 05/14/15 appt with [facility] for other maintenance meds.

## 2015-05-03 ENCOUNTER — Encounter: Payer: Self-pay | Admitting: Internal Medicine

## 2015-05-03 ENCOUNTER — Ambulatory Visit (INDEPENDENT_AMBULATORY_CARE_PROVIDER_SITE_OTHER): Payer: BLUE CROSS/BLUE SHIELD | Admitting: Internal Medicine

## 2015-05-03 ENCOUNTER — Other Ambulatory Visit (INDEPENDENT_AMBULATORY_CARE_PROVIDER_SITE_OTHER): Payer: BLUE CROSS/BLUE SHIELD | Admitting: *Deleted

## 2015-05-03 VITALS — BP 122/68 | HR 99 | Temp 98.6°F | Resp 12 | Ht 65.0 in | Wt 291.2 lb

## 2015-05-03 DIAGNOSIS — E1165 Type 2 diabetes mellitus with hyperglycemia: Secondary | ICD-10-CM | POA: Diagnosis not present

## 2015-05-03 DIAGNOSIS — Z794 Long term (current) use of insulin: Secondary | ICD-10-CM | POA: Diagnosis not present

## 2015-05-03 LAB — POCT GLYCOSYLATED HEMOGLOBIN (HGB A1C): HEMOGLOBIN A1C: 7

## 2015-05-03 MED ORDER — INSULIN ASPART 100 UNIT/ML FLEXPEN: PEN_INJECTOR | SUBCUTANEOUS | Status: DC

## 2015-05-03 MED ORDER — VALSARTAN 320 MG PO TABS: 320.0000 mg | ORAL_TABLET | Freq: Every day | ORAL | Status: DC

## 2015-05-03 MED ORDER — POTASSIUM CHLORIDE CRYS ER 20 MEQ PO TBCR
20.0000 meq | EXTENDED_RELEASE_TABLET | Freq: Every day | ORAL | Status: DC
Start: 2015-05-03 — End: 2015-05-14

## 2015-05-03 MED ORDER — INSULIN GLARGINE 300 UNIT/ML ~~LOC~~ SOPN: 75.0000 [IU] | PEN_INJECTOR | Freq: Every day | SUBCUTANEOUS | Status: DC

## 2015-05-03 MED ORDER — TORSEMIDE 20 MG PO TABS: 20.0000 mg | ORAL_TABLET | Freq: Two times a day (BID) | ORAL | Status: DC

## 2015-05-03 NOTE — Progress Notes (Signed)
Patient ID: Heather Myers, female   DOB: January 26, 1966, 50 y.o.   MRN: [id number]  HPI: Heather Myers is a 50 y.o.-year-old female, returning for f/u for DM2, dx 1991, insulin-dependent since 1993, uncontrolled, without complications.   Last visit 2 years ago.   In last 3 mo >> lost 15 lbs.  She had a torn meniscus >> surgery >> now a rash around her knee and extending.   Last hemoglobin A1c was: Lab Results  Component Value Date   HGBA1C 7.9* 01/18/2014   HGBA1C 8.0* 11/16/2013   HGBA1C 8.7* 05/08/2013   Pt is on a regimen of: - Metformin XR 1000 mg 2x a day - Invokana 300 mg daily - Lantus 80 units at bedtime (has been out of this) - Novolog 25 units 2-3x a day (skips b'fast usually) Also: - Chromium nicotinate 400 mcg   Pt checks her sugars 2-3 a day and they are: - am: 140-200 >> low 200s, 250 - 2h after b'fast: n/c - before lunch: n/c - 2h after lunch: 150 >> 140-160 - before dinner: n/c - 2h after dinner: 160-180 >> n/c - bedtime: 130s - nighttime: 55 - 4x in last 6 mo No lows. Lowest sugar was 68 (skipped dinner) at 2 am >> 55; she has hypoglycemia awareness at 70.  Highest sugar was 400 >> 250.  - no CKD, last BUN/creatinine:  Lab Results  Component Value Date   BUN 13 04/18/2015   CREATININE 0.71 04/18/2015  She is on Valsartan. - last set of lipids: Lab Results  Component Value Date   CHOL 196 11/16/2013   HDL 35.80* 11/16/2013   LDLCALC 126* 11/16/2013   TRIG 172.0* 11/16/2013   CHOLHDL 5 11/16/2013  She is on Lipitor. - last eye exam: 10/2014: no DR  ROS: Constitutional: no weight gain/loss, no fatigue, no subjective hyperthermia/hypothermia Eyes: no blurry vision, no xerophthalmia ENT: no sore throat, no nodules palpated in throat, no dysphagia/odynophagia, no hoarseness Cardiovascular: no CP/SOB/palpitations/leg swelling Respiratory: no cough/SOB Gastrointestinal: no N/V/D/C Musculoskeletal: no muscle/joint aches Skin: + rash, no hair  loss Neurological: no tremors/numbness/tingling/dizziness  I reviewed pt's medications, allergies, PMH, social hx, family hx, and changes were documented in the history of present illness. Otherwise, unchanged from my initial visit note.  Past Medical History  Diagnosis Date  . Diabetes mellitus type 2 in obese (HCC)   . Hypertension   . Depression   . Anxiety   . Hyperlipidemia   . Anemia   . Allergic rhinitis   . Morbid obesity [facility])    Past Surgical History  Procedure Laterality Date  . Cholecystectomy    . Cesarean section      x's 2  . Rso    . 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 Abdominal surgery      chronic abd wall abscess  . Oophorectomy      R ovary removed at [facility]   History   Social History  . Marital Status: Married    Spouse Name: N/A    Number of Children: 2   Occupational History  . 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 Smoking status: Never Smoker   . Smokeless tobacco: Never Used     Comment: Married  . Alcohol Use: No  .  Drug Use: No  . Sexual Activity: Not Currently   Current Outpatient Prescriptions on File Prior to Visit  Medication Sig Dispense Refill  . albuterol (PROAIR HFA) 108 (90 BASE) MCG/ACT inhaler Inhale 2 puffs into the lungs 4 (four) times daily. 18 g 2  . atorvastatin (LIPITOR) 10 MG tablet Take 1 tablet (10 mg total) by mouth daily. Need annual exam for additional refills 90 tablet 1  . augmented betamethasone dipropionate (DIPROLENE-AF) 0.05 % cream Apply topically 2 (two) times daily. 60 g 0  . canagliflozin (INVOKANA) 300 MG TABS tablet Take 300 mg by mouth daily. 90 tablet 3  . citalopram (CELEXA) 40 MG tablet Take 1 tablet (40 mg total) by mouth daily. Appointment needed for additional  refills 90 tablet 0  . CVS LANCETS THIN 26G MISC As directed 200 each 5  . CVS LANCETS THIN MISC Check 4x a day 400 each 3  . esomeprazole (NEXIUM) 20 MG packet Take 20 mg by mouth daily before breakfast. 30 each 5  . Famotidine (PEPCID AC PO) Take 1 tablet by mouth every 6 (six) hours as needed.    . fluticasone (FLONASE) 50 MCG/ACT nasal spray Place 2 sprays into both nostrils daily. 16 g 6  . fluticasone (FLOVENT HFA) 44 MCG/ACT inhaler Inhale 1 puff into the lungs 2 (two) times daily. Need annual exam for additional refills 31.8 Inhaler 1  . Glucose Blood (BAYER BREEZE 2 TEST) DISK Use to check blood sugars up to 6 times a day Dx E11.9 200 each 3  . hydroquinone 4 % cream Apply topically 2 (two) times daily. 28.35 g 0  . Insulin Pen Needle 31G X 5 MM MISC Use 4x a day 400 each 3  . LANTUS SOLOSTAR 100 UNIT/ML Solostar Pen INJECT 80 UNITS INTO THE SKIN DAILY AT 10 PM. 30 pen 0  . Linaclotide (LINZESS) 290 MCG CAPS capsule Take 1 capsule (290 mcg total) by mouth daily. 90 capsule 1  . metFORMIN (GLUCOPHAGE-XR) 500 MG 24 hr tablet Take 2 tablets (1,000 mg total) by mouth 2 (two) times daily. 360 tablet 3  . metFORMIN (GLUCOPHAGE-XR) 500 MG 24 hr tablet Take 1 tablet (500 mg total) by mouth 2 (two) times daily. Need annual exam for additional refills 360 tablet 1  . metoprolol succinate (TOPROL-XL) 50 MG 24 hr tablet Take 1 tablet (50 mg total) by mouth daily. Need annual exam for additional refills 90 tablet 1  . Multiple Vitamins-Minerals (MULTIVITAMIN WITH MINERALS) tablet Take 1 tablet by mouth daily.    Marland Kitchen NOVOLOG FLEXPEN 100 UNIT/ML FlexPen INJECT 25 UNITS INTO THE SKIN 3 (THREE) TIMES DAILY WITH MEALS. 100 mL 5  . potassium chloride SA (KLOR-CON M20) 20 MEQ tablet Take 1 tablet (20 mEq total) by mouth daily. 90 tablet 3  . Pseudoephedrine HCl (SUDAFED 12 HOUR PO) Take 1 tablet by mouth every 6 (six) hours as needed.    . sucralfate (CARAFATE) 1 G tablet Take 1 tablet (1 g total) by mouth 4  (four) times daily. (Patient not taking: Reported on 04/30/2014) 28 tablet 0  . torsemide (DEMADEX) 20 MG tablet Take 1 tablet (20 mg total) by mouth 2 (two) times daily. 30 tablet 0  . tranexamic acid (LYSTEDA) 650 MG TABS tablet Take 2 by mouth 3 times a day for 5 days (Not to exceed 5 days a month) 30 tablet 11  . triamcinolone cream (KENALOG) 0.1 % Apply 1 application topically 2 (two) times daily. 30 g 0  .  valsartan (DIOVAN) 320 MG tablet Take 1 tablet (320 mg total) by mouth daily. 90 tablet 1   No current facility-administered medications on file prior to visit.   Allergies  Allergen Reactions  . Ramipril Shortness Of Breath   Family History  Problem Relation Age of Onset  . Diabetes Mother   . Diabetes Other   . Other Neg Hx    PE: BP 122/68 mmHg  Pulse 99  Temp(Src) 98.6 F (37 C) (Oral)  Resp 12  Ht 5\' 5"  (1.651 m)  Wt 291 lb 3.2 oz (132.087 kg)  BMI 48.46 kg/m2  SpO2 99% Wt Readings from Last 3 Encounters:  05/03/15 291 lb 3.2 oz (132.087 kg)  04/18/15 291 lb 4 oz (132.11 kg)  04/30/14 301 lb (136.533 kg)   Constitutional: obese, in NAD Eyes: PERRLA, EOMI, no exophthalmos ENT: moist mucous membranes, no thyromegaly, no cervical lymphadenopathy Cardiovascular: RRR, No MRG Respiratory: CTA B Gastrointestinal: abdomen soft, NT, ND, BS+ Musculoskeletal: no deformities, strength intact in all 4 Skin: moist, warm, + rash: around R knee Neurological: no tremor with outstretched hands, DTR normal in all 4  ASSESSMENT: 1. DM2, insulin-dependent, uncontrolled, without complications  PLAN:  1. Patient with long-standing, recently more uncontrolled diabetes, on basal-bolus insulin + oral antidiabetic regimen (meformin, Invokana), with improved control (HbA1c today is 7.0%), but high sugars in am and they get better throughout the day. She also has some lows at night >> will reduce basal insulin dose for now. - We discussed about options for treatment for her diabetes,  and I suggested to switch from Lantus to Toujeo:  Patient Instructions  Please continue: - Metformin XR 1000 mg 2x a day - Invokana 300 mg daily in am - Novolog 25 units 2-3x a day   Start: - Toujeo 75 units at bedtime daily.  Please return in 3 months with your sugar log.   Please let me know if the sugars are consistently <80 or >200.  - check sugars at different times of the day - check 2-3 times a day, rotating checks - UTD with eye exams - refilled insulin, also Torsemide + K x 1 mo - Return to clinic in 3 mo with sugar log

## 2015-05-03 NOTE — Patient Instructions (Signed)
Please continue: - Metformin XR 1000 mg 2x a day - Invokana 300 mg daily in am - Novolog 25 units 2-3x a day   Start: - Toujeo 75 units at bedtime daily.  Please return in 3 months with your sugar log.   Please let me know if the sugars are consistently <80 or >200.

## 2015-05-06 ENCOUNTER — Other Ambulatory Visit: Payer: Self-pay | Admitting: Internal Medicine

## 2015-05-06 NOTE — Telephone Encounter (Signed)
Pt has an appt with Kandee Keen for the 17th. The appt is for a med refill. If I remember correctly she is okay until the upcoming appt.

## 2015-05-07 ENCOUNTER — Ambulatory Visit: Payer: BLUE CROSS/BLUE SHIELD | Admitting: Adult Health

## 2015-05-08 NOTE — Telephone Encounter (Signed)
error 

## 2015-05-14 ENCOUNTER — Encounter: Payer: Self-pay | Admitting: Adult Health

## 2015-05-14 ENCOUNTER — Ambulatory Visit (INDEPENDENT_AMBULATORY_CARE_PROVIDER_SITE_OTHER): Payer: BLUE CROSS/BLUE SHIELD | Admitting: Adult Health

## 2015-05-14 VITALS — BP 130/78 | HR 89 | Temp 97.9°F | Wt 296.0 lb

## 2015-05-14 DIAGNOSIS — R899 Unspecified abnormal finding in specimens from other organs, systems and tissues: Secondary | ICD-10-CM

## 2015-05-14 DIAGNOSIS — Z760 Encounter for issue of repeat prescription: Secondary | ICD-10-CM

## 2015-05-14 DIAGNOSIS — K529 Noninfective gastroenteritis and colitis, unspecified: Secondary | ICD-10-CM

## 2015-05-14 LAB — COMPREHENSIVE METABOLIC PANEL
ALK PHOS: 101 U/L (ref 39–117)
ALT: 12 U/L (ref 0–35)
AST: 19 U/L (ref 0–37)
Albumin: 3.4 g/dL — ABNORMAL LOW (ref 3.5–5.2)
BUN: 12 mg/dL (ref 6–23)
CO2: 28 meq/L (ref 19–32)
Calcium: 9.2 mg/dL (ref 8.4–10.5)
Chloride: 103 mEq/L (ref 96–112)
Creatinine, Ser: 0.53 mg/dL (ref 0.40–1.20)
GFR: 158.66 mL/min (ref >60.00)
GLUCOSE: 103 mg/dL — AB (ref 70–99)
POTASSIUM: 3.9 meq/L (ref 3.5–5.1)
SODIUM: 138 meq/L (ref 135–145)
TOTAL PROTEIN: 7.5 g/dL (ref 6.0–8.3)
Total Bilirubin: 0.3 mg/dL (ref 0.2–1.2)

## 2015-05-14 MED ORDER — CITALOPRAM HYDROBROMIDE 40 MG PO TABS: 40.0000 mg | ORAL_TABLET | Freq: Every day | ORAL | Status: DC

## 2015-05-14 MED ORDER — FLUTICASONE PROPIONATE 50 MCG/ACT NA SUSP: 2.0000 | Freq: Every day | NASAL | Status: DC

## 2015-05-14 MED ORDER — METOPROLOL SUCCINATE ER 50 MG PO TB24: 50.0000 mg | ORAL_TABLET | Freq: Every day | ORAL | Status: DC

## 2015-05-14 MED ORDER — ALBUTEROL SULFATE HFA 108 (90 BASE) MCG/ACT IN AERS: 2.0000 | INHALATION_SPRAY | Freq: Four times a day (QID) | RESPIRATORY_TRACT | Status: DC

## 2015-05-14 MED ORDER — ATORVASTATIN CALCIUM 10 MG PO TABS: 10.0000 mg | ORAL_TABLET | Freq: Every day | ORAL | Status: DC

## 2015-05-14 MED ORDER — ESOMEPRAZOLE MAGNESIUM 20 MG PO PACK: 20.0000 mg | PACK | Freq: Every day | ORAL | Status: DC

## 2015-05-14 MED ORDER — FLUTICASONE PROPIONATE HFA 44 MCG/ACT IN AERO: 1.0000 | INHALATION_SPRAY | Freq: Two times a day (BID) | RESPIRATORY_TRACT | Status: DC

## 2015-05-14 MED ORDER — POTASSIUM CHLORIDE CRYS ER 20 MEQ PO TBCR: 20.0000 meq | EXTENDED_RELEASE_TABLET | Freq: Every day | ORAL | Status: DC

## 2015-05-14 MED ORDER — TRIAMCINOLONE ACETONIDE 0.1 % EX CREA: 1.0000 "application " | TOPICAL_CREAM | Freq: Two times a day (BID) | CUTANEOUS | Status: DC

## 2015-05-14 MED ORDER — BETAMETHASONE DIPROPIONATE AUG 0.05 % EX CREA: TOPICAL_CREAM | Freq: Two times a day (BID) | CUTANEOUS | Status: DC

## 2015-05-14 MED ORDER — VALSARTAN 320 MG PO TABS: 320.0000 mg | ORAL_TABLET | Freq: Every day | ORAL | Status: DC

## 2015-05-14 MED ORDER — TORSEMIDE 20 MG PO TABS: 20.0000 mg | ORAL_TABLET | Freq: Two times a day (BID) | ORAL | Status: DC

## 2015-05-14 NOTE — Progress Notes (Signed)
Pre visit review using our clinic review tool, if applicable. No additional management support is needed unless otherwise documented below in the visit note. 

## 2015-05-14 NOTE — Progress Notes (Signed)
Subjective:    Patient ID: Heather Myers, female    DOB: May 09, 1965, 50 y.o.   MRN: [id number]  HPI  50 year old female who presents to the office today for medication refill. She was seeing Dr. Bayard Hugger, but Dr. Bayard Hugger has since left the practice and this patient has not been able to establish with anyone since. She will be seeing Dr. Swaziland at the [facility] office but until Dr. Swaziland starts, she needs her medications refilled.   Patient would also like to see GI for chronic diarrhea for the last year. She states " I have diarrhea about 85% of the time." She denies any blood in stool, has occasional stomach pain. She does not eat healthy.     She saw Dr. Drue Novel in December for contact dermatitis. At this time, a CMP was drawn which showed a slightly elevated Alkaline phosphatase and a decreased potassium level. I will redraw a CMP today in the office   Review of Systems  Constitutional: Negative.   HENT: Negative.   Respiratory: Negative.   Cardiovascular: Negative.   Gastrointestinal: Positive for abdominal pain and diarrhea. Negative for nausea, vomiting, constipation, blood in stool, abdominal distention and rectal pain.  Neurological: Negative.   All other systems reviewed and are negative.  Past Medical History  Diagnosis Date  . Diabetes mellitus type 2 in obese (HCC)   . Hypertension   . Depression   . Anxiety   . Hyperlipidemia   . Anemia   . Allergic rhinitis   . Morbid obesity (HCC)     Social History   Social History  . Marital Status: Married    Spouse Name: N/A  . Number of Children: N/A  . Years of Education: N/A   Occupational History  . Not on file.   Social History Main Topics  . Smoking status: Never Smoker   . Smokeless tobacco: Never Used     Comment: Married  . Alcohol Use: No  . Drug Use: No  . Sexual Activity: Not Currently   Other Topics Concern  . Not on file   Social History Narrative    Past Surgical History  Procedure  Laterality Date  . Cholecystectomy    . Cesarean section      x's 2  . Rso    . 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 Abdominal surgery      chronic abd wall abscess  . Oophorectomy      R ovary removed at [facility] History  Problem Relation Age of Onset  . Diabetes Mother   . Diabetes Other   . Other Neg Hx     Allergies  Allergen Reactions  . Ramipril Shortness Of Breath    Current Outpatient Prescriptions on File Prior to Visit  Medication Sig Dispense Refill  . canagliflozin (INVOKANA) 300 MG TABS tablet Take 300 mg by mouth daily. 90 tablet 3  . Famotidine (PEPCID AC PO) Take 1 tablet by mouth every 6 (six) hours as needed. Reported on 05/03/2015    . Glucose Blood (BAYER BREEZE 2 TEST) DISK Use to check blood sugars up to 6 times a day Dx E11.9 200 each 3  . hydroquinone 4 % cream Apply topically 2 (two) times daily. 28.35 g 0  . insulin aspart (  NOVOLOG FLEXPEN) 100 UNIT/ML FlexPen INJECT 25 UNITS INTO THE SKIN 3 (THREE) TIMES DAILY WITH MEALS. 10 pen 5  . Insulin Glargine (TOUJEO SOLOSTAR) 300 UNIT/ML SOPN Inject 75 Units into the skin at bedtime. 6 pen 2  . Insulin Pen Needle 31G X 5 MM MISC Use 4x a day 400 each 3  . metFORMIN (GLUCOPHAGE-XR) 500 MG 24 hr tablet Take 2 tablets (1,000 mg total) by mouth 2 (two) times daily. 360 tablet 3  . Multiple Vitamins-Minerals (MULTIVITAMIN WITH MINERALS) tablet Take 1 tablet by mouth daily.    . Pseudoephedrine HCl (SUDAFED 12 HOUR PO) Take 1 tablet by mouth every 6 (six) hours as needed. Reported on 05/03/2015     No current facility-administered medications on file prior to visit.    BP 130/78 mmHg  Pulse 89  Temp(Src) 97.9 F (36.6 C) (Oral)  Wt 296 lb (134.265 kg)       Objective:   Physical Exam  Constitutional: She is  oriented to person, place, and time. She appears well-developed and well-nourished. No distress.  Cardiovascular: Normal rate, regular rhythm, normal heart sounds and intact distal pulses.  Exam reveals no gallop and no friction rub.   No murmur heard. Pulmonary/Chest: Effort normal and breath sounds normal. No respiratory distress. She has no wheezes. She has no rales. She exhibits no tenderness.  Abdominal: Soft. Bowel sounds are normal. She exhibits no distension and no mass. There is no tenderness. There is no rebound and no guarding.  obese  Neurological: She is alert and oriented to person, place, and time.  Skin: Skin is warm and dry. No rash noted. She is not diaphoretic. No erythema. No pallor.  Psychiatric: She has a normal mood and affect. Her behavior is normal. Judgment and thought content normal.  Nursing note and vitals reviewed.     Assessment & Plan:  1. Medication refill - Glucose Blood (BAYER BREEZE 2 TEST VI); by In Vitro route. - BAYER MICROLET LANCETS lancets; by Other route. Use as instructed - albuterol (PROAIR HFA) 108 (90 Base) MCG/ACT inhaler; Inhale 2 puffs into the lungs 4 (four) times daily.  Dispense: 18 g; Refill: 3 - atorvastatin (LIPITOR) 10 MG tablet; Take 1 tablet (10 mg total) by mouth daily. Need annual exam for additional refills  Dispense: 90 tablet; Refill: 0 - augmented betamethasone dipropionate (DIPROLENE-AF) 0.05 % cream; Apply topically 2 (two) times daily.  Dispense: 60 g; Refill: 0 - citalopram (CELEXA) 40 MG tablet; Take 1 tablet (40 mg total) by mouth daily. Appointment needed for additional refills  Dispense: 90 tablet; Refill: 0 - esomeprazole (NEXIUM) 20 MG packet; Take 20 mg by mouth daily before breakfast.  Dispense: 90 each; Refill: 0 - fluticasone (FLONASE) 50 MCG/ACT nasal spray; Place 2 sprays into both nostrils daily.  Dispense: 16 g; Refill: 3 - fluticasone (FLOVENT HFA) 44 MCG/ACT inhaler; Inhale 1 puff into the lungs 2 (two) times  daily. Need annual exam for additional refills  Dispense: 31.8 Inhaler; Refill: 1 - metoprolol succinate (TOPROL-XL) 50 MG 24 hr tablet; Take 1 tablet (50 mg total) by mouth daily. Need annual exam for additional refills  Dispense: 90 tablet; Refill: 0 - potassium chloride SA (KLOR-CON M20) 20 MEQ tablet; Take 1 tablet (20 mEq total) by mouth daily.  Dispense: 90 tablet; Refill: 0 - torsemide (DEMADEX) 20 MG tablet; Take 1 tablet (20 mg total) by mouth 2 (two) times daily.  Dispense: 90 tablet; Refill: 0 - valsartan (DIOVAN) 320 MG tablet; Take  1 tablet (320 mg total) by mouth daily.  Dispense: 90 tablet; Refill: 0 - triamcinolone cream (KENALOG) 0.1 %; Apply 1 application topically 2 (two) times daily.  Dispense: 30 g; Refill: 0  2. Abnormal laboratory test - CMP  3. Chronic diarrhea - Ambulatory referral to Gastroenterology

## 2015-05-14 NOTE — Patient Instructions (Addendum)
It was great meeting you today!  I will send in your prescriptions to the pharmacy.   Stop by the lab and get your blood drawn.   Follow up with Dr. Swaziland, in the meantime, if you need anything, please let me know.     Food Choices to Help Relieve Diarrhea, Adult When you have diarrhea, the foods you eat and your eating habits are very important. Choosing the right foods and drinks can help relieve diarrhea. Also, because diarrhea can last up to 7 days, you need to replace lost fluids and electrolytes (such as sodium, potassium, and chloride) in order to help prevent dehydration.  WHAT GENERAL GUIDELINES DO I NEED TO FOLLOW?  Slowly drink 1 cup (8 oz) of fluid for each episode of diarrhea. If you are getting enough fluid, your urine will be clear or pale yellow.  Eat starchy foods. Some good choices include white rice, white toast, pasta, low-fiber cereal, baked potatoes (without the skin), saltine crackers, and bagels.  Avoid large servings of any cooked vegetables.  Limit fruit to two servings per day. A serving is  cup or 1 small piece.  Choose foods with less than 2 g of fiber per serving.  Limit fats to less than 8 tsp (38 g) per day.  Avoid fried foods.  Eat foods that have probiotics in them. Probiotics can be found in certain dairy products.  Avoid foods and beverages that may increase the speed at which food moves through the stomach and intestines (gastrointestinal tract). Things to avoid include:  High-fiber foods, such as dried fruit, raw fruits and vegetables, nuts, seeds, and whole grain foods.  Spicy foods and high-fat foods.  Foods and beverages sweetened with high-fructose corn syrup, honey, or sugar alcohols such as xylitol, sorbitol, and mannitol. WHAT FOODS ARE RECOMMENDED? Grains White rice. White, [place], or pita breads (fresh or toasted), including plain rolls, buns, or bagels. White pasta. Saltine, soda, or graham crackers. Pretzels. Low-fiber cereal.  Cooked cereals made with water (such as cornmeal, farina, or cream cereals). Plain muffins. Matzo. Melba toast. Zwieback.  Vegetables Potatoes (without the skin). Strained tomato and vegetable juices. Most well-cooked and canned vegetables without seeds. Tender lettuce. Fruits Cooked or canned applesauce, apricots, cherries, fruit cocktail, grapefruit, peaches, pears, or plums. Fresh bananas, apples without skin, cherries, grapes, cantaloupe, grapefruit, peaches, oranges, or plums.  Meat and Other Protein Products Baked or boiled chicken. Eggs. Tofu. Fish. Seafood. Smooth peanut butter. Ground or well-cooked tender beef, ham, veal, lamb, pork, or poultry.  Dairy Plain yogurt, kefir, and unsweetened liquid yogurt. Lactose-free milk, buttermilk, or soy milk. Plain hard cheese. Beverages Sport drinks. Clear broths. Diluted fruit juices (except prune). Regular, caffeine-free sodas such as ginger ale. Water. Decaffeinated teas. Oral rehydration solutions. Sugar-free beverages not sweetened with sugar alcohols. Other Bouillon, broth, or soups made from recommended foods.  The items listed above may not be a complete list of recommended foods or beverages. Contact your dietitian for more options. WHAT FOODS ARE NOT RECOMMENDED? Grains Whole grain, whole wheat, bran, or rye breads, rolls, pastas, crackers, and cereals. Wild or brown rice. Cereals that contain more than 2 g of fiber per serving. Corn tortillas or taco shells. Cooked or dry oatmeal. Granola. Popcorn. Vegetables Raw vegetables. Cabbage, broccoli, Brussels sprouts, artichokes, baked beans, beet greens, corn, kale, legumes, peas, sweet potatoes, and yams. Potato skins. Cooked spinach and cabbage. Fruits Dried fruit, including raisins and dates. Raw fruits. Stewed or dried prunes. Fresh apples with skin, apricots,  mangoes, pears, raspberries, and strawberries.  Meat and Other Protein Products Chunky peanut butter. Nuts and seeds. Beans and  lentils. Tomasa Blase.  Dairy High-fat cheeses. Milk, chocolate milk, and beverages made with milk, such as milk shakes. Cream. Ice cream. Sweets and Desserts Sweet rolls, doughnuts, and sweet breads. Pancakes and waffles. Fats and Oils Butter. Cream sauces. Margarine. Salad oils. Plain salad dressings. Olives. Avocados.  Beverages Caffeinated beverages (such as coffee, tea, soda, or energy drinks). Alcoholic beverages. Fruit juices with pulp. Prune juice. Soft drinks sweetened with high-fructose corn syrup or sugar alcohols. Other Coconut. Hot sauce. Chili powder. Mayonnaise. Gravy. Cream-based or milk-based soups.  The items listed above may not be a complete list of foods and beverages to avoid. Contact your dietitian for more information. WHAT SHOULD I DO IF I BECOME DEHYDRATED? Diarrhea can sometimes lead to dehydration. Signs of dehydration include dark urine and dry mouth and skin. If you think you are dehydrated, you should rehydrate with an oral rehydration solution. These solutions can be purchased at pharmacies, retail stores, or online.  Drink -1 cup (120-240 mL) of oral rehydration solution each time you have an episode of diarrhea. If drinking this amount makes your diarrhea worse, try drinking smaller amounts more often. For example, drink 1-3 tsp (5-15 mL) every 5-10 minutes.  A general rule for staying hydrated is to drink 1-2 L of fluid per day. Talk to your health care provider about the specific amount you should be drinking each day. Drink enough fluids to keep your urine clear or pale yellow.   This information is not intended to replace advice given to you by your health care provider. Make sure you discuss any questions you have with your health care provider.   Document Released: 07/04/2003 Document Revised: 05/04/2014 Document Reviewed: 03/06/2013 Elsevier Interactive Patient Education [employer]

## 2015-05-15 ENCOUNTER — Encounter: Payer: Self-pay | Admitting: Gastroenterology

## 2015-05-20 ENCOUNTER — Ambulatory Visit (INDEPENDENT_AMBULATORY_CARE_PROVIDER_SITE_OTHER): Payer: BLUE CROSS/BLUE SHIELD | Admitting: Adult Health

## 2015-05-20 ENCOUNTER — Telehealth: Payer: Self-pay | Admitting: Adult Health

## 2015-05-20 ENCOUNTER — Encounter: Payer: Self-pay | Admitting: Adult Health

## 2015-05-20 VITALS — BP 100/68 | HR 95 | Temp 98.0°F | Ht 65.0 in | Wt 293.7 lb

## 2015-05-20 DIAGNOSIS — J0100 Acute maxillary sinusitis, unspecified: Secondary | ICD-10-CM

## 2015-05-20 MED ORDER — DOXYCYCLINE HYCLATE 100 MG PO CAPS: 100.0000 mg | ORAL_CAPSULE | Freq: Two times a day (BID) | ORAL | Status: DC

## 2015-05-20 NOTE — Patient Instructions (Addendum)
It was great seeing you again today   I am sorry you are feeling so horrible.   I have sent in a prescription for Doxycycline, take this twice a day for 7 days.   Get some Flonase and normal saline nasal spray to use. These will help keep your sinus cavities open.   If you are not feeling any better in the next 2-3 days, please let me know.     Sinusitis, Adult Sinusitis is redness, soreness, and inflammation of the paranasal sinuses. Paranasal sinuses are air pockets within the bones of your face. They are located beneath your eyes, in the middle of your forehead, and above your eyes. In healthy paranasal sinuses, mucus is able to drain out, and air is able to circulate through them by way of your nose. However, when your paranasal sinuses are inflamed, mucus and air can become trapped. This can allow bacteria and other germs to grow and cause infection. Sinusitis can develop quickly and last only a short time (acute) or continue over a long period (chronic). Sinusitis that lasts for more than 12 weeks is considered chronic. CAUSES Causes of sinusitis include:  Allergies.  Structural abnormalities, such as displacement of the cartilage that separates your nostrils (deviated septum), which can decrease the air flow through your nose and sinuses and affect sinus drainage.  Functional abnormalities, such as when the small hairs (cilia) that line your sinuses and help remove mucus do not work properly or are not present. SIGNS AND SYMPTOMS Symptoms of acute and chronic sinusitis are the same. The primary symptoms are pain and pressure around the affected sinuses. Other symptoms include:  Upper toothache.  Earache.  Headache.  Bad breath.  Decreased sense of smell and taste.  A cough, which worsens when you are lying flat.  Fatigue.  Fever.  Thick drainage from your nose, which often is green and may contain pus (purulent).  Swelling and warmth over the affected  sinuses. DIAGNOSIS Your health care provider will perform a physical exam. During your exam, your health care provider may perform any of the following to help determine if you have acute sinusitis or chronic sinusitis:  Look in your nose for signs of abnormal growths in your nostrils (nasal polyps).  Tap over the affected sinus to check for signs of infection.  View the inside of your sinuses using an imaging device that has a light attached (endoscope). If your health care provider suspects that you have chronic sinusitis, one or more of the following tests may be recommended:  Allergy tests.  Nasal culture. A sample of mucus is taken from your nose, sent to a lab, and screened for bacteria.  Nasal cytology. A sample of mucus is taken from your nose and examined by your health care provider to determine if your sinusitis is related to an allergy. TREATMENT Most cases of acute sinusitis are related to a viral infection and will resolve on their own within 10 days. Sometimes, medicines are prescribed to help relieve symptoms of both acute and chronic sinusitis. These may include pain medicines, decongestants, nasal steroid sprays, or saline sprays. However, for sinusitis related to a bacterial infection, your health care provider will prescribe antibiotic medicines. These are medicines that will help kill the bacteria causing the infection. Rarely, sinusitis is caused by a fungal infection. In these cases, your health care provider will prescribe antifungal medicine. For some cases of chronic sinusitis, surgery is needed. Generally, these are cases in which sinusitis recurs more  than 3 times per year, despite other treatments. HOME CARE INSTRUCTIONS  Drink plenty of water. Water helps thin the mucus so your sinuses can drain more easily.  Use a humidifier.  Inhale steam 3-4 times a day (for example, sit in the bathroom with the shower running).  Apply a warm, moist washcloth to your face  3-4 times a day, or as directed by your health care provider.  Use saline nasal sprays to help moisten and clean your sinuses.  Take medicines only as directed by your health care provider.  If you were prescribed either an antibiotic or antifungal medicine, finish it all even if you start to feel better. SEEK IMMEDIATE MEDICAL CARE IF:  You have increasing pain or severe headaches.  You have nausea, vomiting, or drowsiness.  You have swelling around your face.  You have vision problems.  You have a stiff neck.  You have difficulty breathing.   This information is not intended to replace advice given to you by your health care provider. Make sure you discuss any questions you have with your health care provider.   Document Released: 04/13/2005 Document Revised: 05/04/2014 Document Reviewed: 04/28/2011 Elsevier Interactive Patient Education [employer]

## 2015-05-20 NOTE — Telephone Encounter (Signed)
Patient Name: Heather Myers  DOB: 01/06/1966    Initial Comment Caller States would like to make a sick appointment, fever. aching, labored breathing   Nurse Assessment  Nurse: Stefano Gaul, RN, Dwana Curd Date/Time (Eastern Time): 05/20/2015 9:13:48 AM  Confirm and document reason for call. If symptomatic, describe symptoms. You must click the next button to save text entered. ---Caller states she saw Dr. Tawanna Cooler last Tuesday to establish her as a new pt. Has a lot of nasal congestion. She is sleeping non stop. Having a lot of weakness and fatigue. No fever. She has had chills. Highest temp 100. She is forcing herself to drink fluids. No headache. She has mild asthma. Has used her inhaler about 30 min ago which helped a little. She gets SOB when she exerts herself. She is using Sudafed and mucinex; alka seltzer cough and flu which is not helping.  Has the patient traveled out of the country within the last 30 days? ---No  Does the patient have any new or worsening symptoms? ---Yes  Will a triage be completed? ---Yes  Related visit to physician within the last 2 weeks? ---No  Does the PT have any chronic conditions? (i.e. diabetes, asthma, etc.) ---Yes  List chronic conditions. ---diabetes; HTN, mild asthma  Did the patient indicate they were pregnant? ---No  Is this a behavioral health or substance abuse call? ---No     Guidelines    Guideline Title Affirmed Question Affirmed Notes  Sinus Pain or Congestion [1] Redness or swelling on the cheek, forehead or around the eye AND [2] no fever    Final Disposition User   See Physician within 4 Hours (or PCP triage) Stefano Gaul, RN, Vera    Comments  Called back line and spoke to [place] and gave report that pt has sinus congestion and puffiness in her face and has had some SOB and has used her inhaler which helped with triage outcome of see physician within 4 hrs. Pt states she saw Dr. Tawanna Cooler last week. Shanda Bumps states she saw Duane Lope. Warm transferred pt to  the office for appt.   Disagree/Comply: Comply

## 2015-05-20 NOTE — Progress Notes (Signed)
Subjective:    Patient ID: Heather Myers, female    DOB: [birth year], 50 y.o.   MRN: [id number]  HPI  50 year old female who presents to the office today for an acute issue of fatigue, chest soreness, maxillary sinus pain and pressure for 6 days. She feels as though her symptoms have been getting worse over the week.   She denies fever, nausea, vomiting or diarrhea.   Has taken OTC Sudafed, Mucinex, and afrin, none of which helped.   Review of Systems  Constitutional: Positive for activity change, appetite change and fatigue. Negative for fever, chills and diaphoresis.  HENT: Positive for congestion and sinus pressure. Negative for ear discharge, ear pain, hearing loss, postnasal drip, rhinorrhea and sore throat.   Cardiovascular: Negative.   Musculoskeletal: Positive for back pain.  Neurological: Positive for headaches.  Hematological: Negative.   All other systems reviewed and are negative.  Past Medical History  Diagnosis Date  . Diabetes mellitus type 2 in obese (HCC)   . Hypertension   . Depression   . Anxiety   . Hyperlipidemia   . Anemia   . Allergic rhinitis   . Morbid obesity (HCC)     Social History   Social History  . Marital Status: Married    Spouse Name: N/A  . Number of Children: N/A  . Years of Education: N/A   Occupational History  . Not on file.   Social History Main Topics  . Smoking status: Never Smoker   . Smokeless tobacco: Never Used     Comment: Married  . Alcohol Use: No  . Drug Use: No  . Sexual Activity: Not Currently   Other Topics Concern  . Not on file   Social History Narrative    Past Surgical History  Procedure Laterality Date  . Cholecystectomy    . Cesarean section      x's 2  . Rso    . 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  Abdominal surgery      chronic abd wall abscess  . Oophorectomy      R ovary removed at [facility] History  Problem Relation Age of Onset  . Diabetes Mother   . Diabetes Other   . Other Neg Hx     Allergies  Allergen Reactions  . Ramipril Shortness Of Breath    Current Outpatient Prescriptions on File Prior to Visit  Medication Sig Dispense Refill  . albuterol (PROAIR HFA) 108 (90 Base) MCG/ACT inhaler Inhale 2 puffs into the lungs 4 (four) times daily. 18 g 3  . atorvastatin (LIPITOR) 10 MG tablet Take 1 tablet (10 mg total) by mouth daily. Need annual exam for additional refills 90 tablet 0  . augmented betamethasone dipropionate (DIPROLENE-AF) 0.05 % cream Apply topically 2 (two) times daily. 60 g 0  . BAYER MICROLET LANCETS lancets by Other route. Use as instructed    . canagliflozin (INVOKANA) 300 MG TABS tablet Take 300 mg by mouth daily. 90 tablet 3  . citalopram (CELEXA) 40 MG tablet Take 1 tablet (40 mg total) by mouth daily. Appointment needed for additional refills 90 tablet 0  . esomeprazole (NEXIUM) 20 MG packet Take 20 mg by mouth daily before breakfast. 90 each 0  . Famotidine (PEPCID AC PO) Take  1 tablet by mouth every 6 (six) hours as needed. Reported on 05/03/2015    . fluticasone (FLONASE) 50 MCG/ACT nasal spray Place 2 sprays into both nostrils daily. 16 g 3  . fluticasone (FLOVENT HFA) 44 MCG/ACT inhaler Inhale 1 puff into the lungs 2 (two) times daily. Need annual exam for additional refills 31.8 Inhaler 1  . Glucose Blood (BAYER BREEZE 2 TEST VI) by In Vitro route.    . Glucose Blood (BAYER BREEZE 2 TEST) DISK Use to check blood sugars up to 6 times a day Dx E11.9 200 each 3  . hydroquinone 4 % cream Apply topically 2 (two) times daily. 28.35 g 0  . insulin aspart (NOVOLOG FLEXPEN) 100 UNIT/ML FlexPen INJECT 25 UNITS INTO THE SKIN 3 (THREE) TIMES DAILY WITH MEALS. 10 pen 5  . Insulin Glargine (TOUJEO SOLOSTAR) 300 UNIT/ML SOPN Inject 75 Units into the  skin at bedtime. 6 pen 2  . Insulin Pen Needle 31G X 5 MM MISC Use 4x a day 400 each 3  . metFORMIN (GLUCOPHAGE-XR) 500 MG 24 hr tablet Take 2 tablets (1,000 mg total) by mouth 2 (two) times daily. 360 tablet 3  . metoprolol succinate (TOPROL-XL) 50 MG 24 hr tablet Take 1 tablet (50 mg total) by mouth daily. Need annual exam for additional refills 90 tablet 0  . Multiple Vitamins-Minerals (MULTIVITAMIN WITH MINERALS) tablet Take 1 tablet by mouth daily.    . potassium chloride SA (KLOR-CON M20) 20 MEQ tablet Take 1 tablet (20 mEq total) by mouth daily. 90 tablet 0  . Pseudoephedrine HCl (SUDAFED 12 HOUR PO) Take 1 tablet by mouth every 6 (six) hours as needed. Reported on 05/03/2015    . torsemide (DEMADEX) 20 MG tablet Take 1 tablet (20 mg total) by mouth 2 (two) times daily. 90 tablet 0  . triamcinolone cream (KENALOG) 0.1 % Apply 1 application topically 2 (two) times daily. 30 g 0  . valsartan (DIOVAN) 320 MG tablet Take 1 tablet (320 mg total) by mouth daily. 90 tablet 0   No current facility-administered medications on file prior to visit.    BP 100/68 mmHg  Pulse 95  Temp(Src) 98 F (36.7 C) (Oral)  Ht 5\' 5"  (1.651 m)  Wt 293 lb 11.2 oz (133.221 kg)  BMI 48.87 kg/m2  SpO2 99%       Objective:   Physical Exam  Constitutional: She is oriented to person, place, and time. She appears well-developed and well-nourished. No distress.  Appears tired  HENT:  Head: Normocephalic and atraumatic.  Right Ear: External ear normal.  Left Ear: External ear normal.  Nose: Nose normal.  Mouth/Throat: Oropharynx is clear and moist. No oropharyngeal exudate.  Neck: Normal range of motion. Neck supple.  Cardiovascular: Normal rate, regular rhythm, normal heart sounds and intact distal pulses.  Exam reveals no gallop and no friction rub.   No murmur heard. Pulmonary/Chest: Effort normal and breath sounds normal. No respiratory distress. She has no wheezes. She has no rales. She exhibits no  tenderness.  Lymphadenopathy:    She has no cervical adenopathy.  Neurological: She is alert and oriented to person, place, and time. She has normal reflexes.  Skin: Skin is warm and dry. No rash noted. She is not diaphoretic. No erythema. No pallor.  Psychiatric: She has a normal mood and affect. Her behavior is normal. Judgment and thought content normal.  Nursing note and vitals reviewed.     Assessment & Plan:  1. Acute maxillary sinusitis,  recurrence not specified - doxycycline (VIBRAMYCIN) 100 MG capsule; Take 1 capsule (100 mg total) by mouth 2 (two) times daily.  Dispense: 14 capsule; Refill: 0 - Flonase and normal saline nasal spray - Stay hydrated and rest - Stop all OTC cold medications - Follow up if no improvement in 2-3 days.

## 2015-05-20 NOTE — Progress Notes (Signed)
Pre visit review using our clinic review tool, if applicable. No additional management support is needed unless otherwise documented below in the visit note. 

## 2015-05-20 NOTE — Telephone Encounter (Signed)
Appointment scheduled with Denyse Amass today at 3:00

## 2015-05-23 ENCOUNTER — Ambulatory Visit: Payer: BLUE CROSS/BLUE SHIELD | Admitting: Internal Medicine

## 2015-06-28 ENCOUNTER — Other Ambulatory Visit: Payer: Self-pay | Admitting: Adult Health

## 2015-07-01 NOTE — Telephone Encounter (Signed)
Ok to refill 

## 2015-07-03 ENCOUNTER — Ambulatory Visit: Payer: BLUE CROSS/BLUE SHIELD | Admitting: Gastroenterology

## 2015-07-16 ENCOUNTER — Encounter: Payer: Self-pay | Admitting: Family Medicine

## 2015-07-16 ENCOUNTER — Ambulatory Visit (INDEPENDENT_AMBULATORY_CARE_PROVIDER_SITE_OTHER): Payer: BLUE CROSS/BLUE SHIELD | Admitting: Family Medicine

## 2015-07-16 VITALS — BP 110/70 | HR 96 | Temp 100.2°F | Wt 285.3 lb

## 2015-07-16 DIAGNOSIS — R509 Fever, unspecified: Secondary | ICD-10-CM

## 2015-07-16 DIAGNOSIS — J029 Acute pharyngitis, unspecified: Secondary | ICD-10-CM

## 2015-07-16 DIAGNOSIS — B349 Viral infection, unspecified: Secondary | ICD-10-CM | POA: Diagnosis not present

## 2015-07-16 DIAGNOSIS — M436 Torticollis: Secondary | ICD-10-CM

## 2015-07-16 LAB — POCT INFLUENZA A/B
INFLUENZA B, POC: NEGATIVE
Influenza A, POC: NEGATIVE

## 2015-07-16 LAB — POCT RAPID STREP A (OFFICE): Rapid Strep A Screen: NEGATIVE

## 2015-07-16 NOTE — Progress Notes (Signed)
Pre visit review using our clinic review tool, if applicable. No additional management support is needed unless otherwise documented below in the visit note. 

## 2015-07-16 NOTE — Progress Notes (Signed)
   Subjective:    Patient ID: Heather Myers, female    DOB: Apr 27, 1966, 50 y.o.   MRN: [id number]  HPI Here for 3 days of fevers, body aches, chills, right ear pain, and a constant pain in the back of the right side of the head. No neck pain. No blurred vision. No neurologic deficits, although she does have some mild dizziness that comes and goes. No photophobia. She has a mild ST. No NVD. No rashes. No coughing. She is drinking fluids. She has taken Ibuprofen and Alka Seltzer Plus without much relief. No recent travel. Her son was treated for strep throat about 2 weeks ago.    Review of Systems  Constitutional: Positive for fever and chills. Negative for diaphoresis.  HENT: Positive for ear pain and sore throat. Negative for congestion, facial swelling, mouth sores, nosebleeds, postnasal drip, sinus pressure, sneezing, trouble swallowing and voice change.   Eyes: Negative.   Respiratory: Negative.   Cardiovascular: Negative.   Gastrointestinal: Negative.   Genitourinary: Negative.   Skin: Negative for rash.  Neurological: Positive for dizziness and headaches. Negative for tremors, seizures, syncope, facial asymmetry, speech difficulty, light-headedness and numbness.  Hematological: Negative for adenopathy. Does not bruise/bleed easily.       Objective:   Physical Exam  Constitutional: She is oriented to person, place, and time.  Alert but appears somewhat ill   HENT:  Head: Normocephalic and atraumatic.  Right Ear: External ear normal.  Left Ear: External ear normal.  Nose: Nose normal.  Mouth/Throat: Oropharynx is clear and moist. No oropharyngeal exudate.  Eyes: Conjunctivae and EOM are normal. Pupils are equal, round, and reactive to light.  Neck: Normal range of motion. Neck supple. No thyromegaly present.  Cardiovascular: Normal rate, regular rhythm, normal heart sounds and intact distal pulses.   No murmur heard. Pulmonary/Chest: Effort normal and breath sounds normal. No  respiratory distress. She has no wheezes. She has no rales. She exhibits no tenderness.  Abdominal: Soft. Bowel sounds are normal. She exhibits no distension and no mass. There is no tenderness. There is no rebound and no guarding.  Musculoskeletal: Normal range of motion. She exhibits no edema or tenderness.  Lymphadenopathy:    She has no cervical adenopathy.  Neurological: She is alert and oriented to person, place, and time. No cranial nerve deficit. She exhibits normal muscle tone. Coordination normal.  Skin: No rash noted.          Assessment & Plan:  Fever of uncertain etiology. This appears to be a viral illness but it is not typical for influenza. It is not typical for strep throat. Advised her to drink plenty of fluids. She can use Ibuprofen and Tylenol to reduce fever and headache. We will get labs for strep and flu, as well as a CBC. We will contact her tomorrow with lab results and see ow she is doing. I advised her to go to the ER tonight if the headache became any worse or if she developed any neurologic signs like blurred vision, slurred speech, etc.

## 2015-07-17 ENCOUNTER — Encounter: Payer: Self-pay | Admitting: Family Medicine

## 2015-07-17 ENCOUNTER — Telehealth: Payer: Self-pay | Admitting: Internal Medicine

## 2015-07-17 LAB — CBC WITH DIFFERENTIAL/PLATELET
BASOS ABS: 0 10*3/uL (ref 0.0–0.1)
Basophils Relative: 0.5 % (ref 0.0–3.0)
EOS ABS: 1 10*3/uL — AB (ref 0.0–0.7)
Eosinophils Relative: 15.3 % — ABNORMAL HIGH (ref 0.0–5.0)
HCT: 37 % (ref 36.0–46.0)
Hemoglobin: 11.8 g/dL — ABNORMAL LOW (ref 12.0–15.0)
LYMPHS ABS: 2 10*3/uL (ref 0.7–4.0)
Lymphocytes Relative: 29.6 % (ref 12.0–46.0)
MCHC: 31.9 g/dL (ref 30.0–36.0)
MCV: 78.7 fl (ref 78.0–100.0)
MONO ABS: 1 10*3/uL (ref 0.1–1.0)
Monocytes Relative: 15.2 % — ABNORMAL HIGH (ref 3.0–12.0)
NEUTROS ABS: 2.6 10*3/uL (ref 1.4–7.7)
NEUTROS PCT: 39.4 % — AB (ref 43.0–77.0)
PLATELETS: 371 10*3/uL (ref 150.0–400.0)
RBC: 4.7 Mil/uL (ref 3.87–5.11)
RDW: 16.6 % — ABNORMAL HIGH (ref 11.5–15.5)
WBC: 6.7 10*3/uL (ref 4.0–10.5)

## 2015-07-17 NOTE — Telephone Encounter (Signed)
Pt would like blood work. Pt saw dr fry yesterday

## 2015-07-17 NOTE — Telephone Encounter (Signed)
Please advise 

## 2015-07-17 NOTE — Telephone Encounter (Signed)
See the Result Note

## 2015-07-18 ENCOUNTER — Other Ambulatory Visit: Payer: BLUE CROSS/BLUE SHIELD

## 2015-07-18 NOTE — Telephone Encounter (Signed)
See result note.  

## 2015-07-18 NOTE — Addendum Note (Signed)
Addended by: Johnella Moloney on: 07/18/2015 10:09 AM   Modules accepted: Orders

## 2015-07-18 NOTE — Telephone Encounter (Signed)
Pt were call yesterday but no answer

## 2015-07-18 NOTE — Telephone Encounter (Signed)
Please call her and tell her the results were normal, consistent with a viral infection. Ask her how she is feeling, how is the headache, etc. Also explain to her that my regular nurse is out sick and that numerous different nurses have been working with me this week who are looking at various desktops for my lab interpretations. That is why there was a bit of a delay calling her with results.

## 2015-07-22 ENCOUNTER — Emergency Department (HOSPITAL_BASED_OUTPATIENT_CLINIC_OR_DEPARTMENT_OTHER)
Admission: EM | Admit: 2015-07-22 | Discharge: 2015-07-22 | Disposition: A | Discharge status: Home / Self Care | Payer: BLUE CROSS/BLUE SHIELD | Attending: Emergency Medicine | Admitting: Emergency Medicine

## 2015-07-22 ENCOUNTER — Other Ambulatory Visit: Payer: Self-pay | Admitting: Internal Medicine

## 2015-07-22 ENCOUNTER — Encounter (HOSPITAL_BASED_OUTPATIENT_CLINIC_OR_DEPARTMENT_OTHER): Payer: Self-pay | Admitting: *Deleted

## 2015-07-22 ENCOUNTER — Emergency Department (HOSPITAL_BASED_OUTPATIENT_CLINIC_OR_DEPARTMENT_OTHER): Payer: BLUE CROSS/BLUE SHIELD

## 2015-07-22 DIAGNOSIS — Z7984 Long term (current) use of oral hypoglycemic drugs: Secondary | ICD-10-CM | POA: Diagnosis not present

## 2015-07-22 DIAGNOSIS — J0101 Acute recurrent maxillary sinusitis: Secondary | ICD-10-CM | POA: Diagnosis not present

## 2015-07-22 DIAGNOSIS — Z8709 Personal history of other diseases of the respiratory system: Secondary | ICD-10-CM | POA: Diagnosis not present

## 2015-07-22 DIAGNOSIS — E119 Type 2 diabetes mellitus without complications: Secondary | ICD-10-CM | POA: Diagnosis not present

## 2015-07-22 DIAGNOSIS — Z862 Personal history of diseases of the blood and blood-forming organs and certain disorders involving the immune mechanism: Secondary | ICD-10-CM | POA: Insufficient documentation

## 2015-07-22 DIAGNOSIS — R509 Fever, unspecified: Secondary | ICD-10-CM | POA: Diagnosis present

## 2015-07-22 DIAGNOSIS — Z79899 Other long term (current) drug therapy: Secondary | ICD-10-CM | POA: Diagnosis not present

## 2015-07-22 DIAGNOSIS — E785 Hyperlipidemia, unspecified: Secondary | ICD-10-CM | POA: Diagnosis not present

## 2015-07-22 DIAGNOSIS — F419 Anxiety disorder, unspecified: Secondary | ICD-10-CM | POA: Insufficient documentation

## 2015-07-22 DIAGNOSIS — Z7952 Long term (current) use of systemic steroids: Secondary | ICD-10-CM | POA: Insufficient documentation

## 2015-07-22 DIAGNOSIS — Z794 Long term (current) use of insulin: Secondary | ICD-10-CM | POA: Insufficient documentation

## 2015-07-22 DIAGNOSIS — R110 Nausea: Secondary | ICD-10-CM | POA: Insufficient documentation

## 2015-07-22 DIAGNOSIS — F329 Major depressive disorder, single episode, unspecified: Secondary | ICD-10-CM | POA: Insufficient documentation

## 2015-07-22 DIAGNOSIS — Z7951 Long term (current) use of inhaled steroids: Secondary | ICD-10-CM | POA: Insufficient documentation

## 2015-07-22 DIAGNOSIS — Z792 Long term (current) use of antibiotics: Secondary | ICD-10-CM | POA: Insufficient documentation

## 2015-07-22 DIAGNOSIS — I10 Essential (primary) hypertension: Secondary | ICD-10-CM | POA: Insufficient documentation

## 2015-07-22 LAB — BASIC METABOLIC PANEL
Anion gap: 9 (ref 5–15)
BUN: 11 mg/dL (ref 6–20)
CALCIUM: 9 mg/dL (ref 8.9–10.3)
CO2: 24 mmol/L (ref 22–32)
CREATININE: 0.65 mg/dL (ref 0.44–1.00)
Chloride: 102 mmol/L (ref 101–111)
GFR calc non Af Amer: >60 mL/min (ref >60)
Glucose, Bld: 155 mg/dL — ABNORMAL HIGH (ref 65–99)
Potassium: 3.2 mmol/L — ABNORMAL LOW (ref 3.5–5.1)
Sodium: 135 mmol/L (ref 135–145)

## 2015-07-22 LAB — CBC
HEMATOCRIT: 33.9 % — AB (ref 36.0–46.0)
HEMOGLOBIN: 10.5 g/dL — AB (ref 12.0–15.0)
MCH: 25.2 pg — ABNORMAL LOW (ref 26.0–34.0)
MCHC: 31 g/dL (ref 30.0–36.0)
MCV: 81.3 fL (ref 78.0–100.0)
Platelets: 306 10*3/uL (ref 150–400)
RBC: 4.17 MIL/uL (ref 3.87–5.11)
RDW: 16.2 % — ABNORMAL HIGH (ref 11.5–15.5)
WBC: 7.5 10*3/uL (ref 4.0–10.5)

## 2015-07-22 LAB — RAPID STREP SCREEN (MED CTR MEBANE ONLY): Streptococcus, Group A Screen (Direct): NEGATIVE

## 2015-07-22 MED ORDER — AMOXICILLIN-POT CLAVULANATE 875-125 MG PO TABS
1.0000 | ORAL_TABLET | Freq: Two times a day (BID) | ORAL | Status: DC
Start: 2015-07-22 — End: 2015-09-11

## 2015-07-22 NOTE — Discharge Instructions (Signed)
Receiving emergency department for fever with concern for sinusitis. You will be treated for sinusitis with Augmentin. If you have  worsening fevers, headache, dizziness return to emergency department for reevaluation. Please follow with your PCP for further evaluation the next several days.

## 2015-07-22 NOTE — ED Notes (Signed)
Fever, body aches, for a week. She has been seen by her MD and had a negative flu and strep screen. She was told she probable has a virus. She was seen at [facility] 2 days later and told the same. States she is no better and wants to be rechecked. She is speaking in complete sentences. C/o SOB, ear pain, headache and facial numbness.

## 2015-07-22 NOTE — ED Provider Notes (Signed)
CSN: [id number]     Arrival date & time 07/22/15  1059 History   First MD Initiated Contact with Patient 07/22/15 1152     Chief Complaint  Patient presents with  . Fever      HPI  50 year old female Presenting female presented for one week of congestion and fever. Patient was seen by primary care provider and urgent care who told her that she likely had a viral infection with negative workup. Patient is concerned because she continues to have fever and is worried. She reports some shortness of breath with induration this morning, but none at this time, denies chest pain, denies nausea, vomiting, diarrhea. Of note her son was diagnosed with strep throat earlier this week.   Past Medical History  Diagnosis Date  . Diabetes mellitus type 2 in obese (HCC)   . Hypertension   . Depression   . Anxiety   . Hyperlipidemia   . Anemia   . Allergic rhinitis   . Morbid obesity [facility])    Past Surgical History  Procedure Laterality Date  . Cholecystectomy    . Cesarean section      x's 2  . Rso    . 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 Abdominal surgery      chronic abd wall abscess  . Oophorectomy      R ovary removed at [facility] History  Problem Relation Age of Onset  . Diabetes Mother   . Diabetes Other   . Other Neg Hx    Social History  Substance Use Topics  . Smoking status: Never Smoker   . Smokeless tobacco: Never Used     Comment: Married  . Alcohol Use: No   OB History    Gravida Para Term Preterm AB TAB SAB Ectopic Multiple Living   3 2   1  1   2      Review of Systems  Constitutional: Negative.   HENT: Positive for congestion, rhinorrhea, sinus pressure and sore throat. Negative for dental problem, drooling, ear discharge, ear pain, facial swelling, hearing loss,  mouth sores, nosebleeds, postnasal drip, sneezing, tinnitus and voice change.   Eyes: Negative.   Respiratory: Positive for shortness of breath. Negative for apnea, cough, choking, chest tightness, wheezing and stridor.   Cardiovascular: Negative.   Gastrointestinal: Positive for nausea. Negative for vomiting, abdominal pain, constipation, blood in stool, abdominal distention, anal bleeding and rectal pain.  Endocrine: Negative.   Genitourinary: Negative.   Musculoskeletal: Negative.   Skin: Negative.       Allergies  Ramipril  Home Medications   Prior to Admission medications   Medication Sig Start Date End Date Taking? Authorizing Provider  albuterol (PROAIR HFA) 108 (90 Base) MCG/ACT inhaler Inhale 2 puffs into the lungs 4 (four) times daily. 05/14/15   05/16/15, NP  amoxicillin-clavulanate (AUGMENTIN) 875-125 MG tablet Take 1 tablet by mouth 2 (two) times daily. 07/22/15   Dazja Houchin A Javiel Canepa, MD  atorvastatin (LIPITOR) 10 MG tablet Take 1 tablet (10 mg total) by mouth daily. Need annual exam for additional refills 05/14/15   05/16/15, NP  augmented betamethasone dipropionate (DIPROLENE-AF) 0.05 % cream Apply topically 2 (two) times daily. 05/14/15   05/16/15, NP  BAYER MICROLET LANCETS lancets by Other route. Use as instructed  Historical Provider, MD  canagliflozin (INVOKANA) 300 MG TABS tablet Take 300 mg by mouth daily. 02/21/15   Newt Lukes, MD  citalopram (CELEXA) 40 MG tablet Take 1 tablet (40 mg total) by mouth daily. Appointment needed for additional refills 05/14/15   Shirline Frees, NP  doxycycline (VIBRAMYCIN) 100 MG capsule Take 1 capsule (100 mg total) by mouth 2 (two) times daily. 05/20/15   Shirline Frees, NP  esomeprazole (NEXIUM) 20 MG packet Take 20 mg by mouth daily before breakfast. 05/14/15   Shirline Frees, NP  Famotidine (PEPCID AC PO) Take 1 tablet by mouth every 6 (six) hours as needed. Reported on 05/03/2015    Historical Provider, MD  fluticasone  (FLONASE) 50 MCG/ACT nasal spray Place 2 sprays into both nostrils daily. 05/14/15   Shirline Frees, NP  fluticasone (FLOVENT HFA) 44 MCG/ACT inhaler Inhale 1 puff into the lungs 2 (two) times daily. Need annual exam for additional refills 05/14/15   Shirline Frees, NP  Glucose Blood (BAYER BREEZE 2 TEST VI) by In Vitro route.    Historical Provider, MD  Glucose Blood (BAYER BREEZE 2 TEST) DISK Use to check blood sugars up to 6 times a day Dx E11.9 08/27/14   Newt Lukes, MD  hydroquinone 4 % cream APPLY TO AFFECTED AREA TWICE A DAY 07/02/15   Shirline Frees, NP  insulin aspart (NOVOLOG FLEXPEN) 100 UNIT/ML FlexPen INJECT 25 UNITS INTO THE SKIN 3 (THREE) TIMES DAILY WITH MEALS. 05/03/15   Carlus Pavlov, MD  Insulin Glargine (TOUJEO SOLOSTAR) 300 UNIT/ML SOPN Inject 75 Units into the skin at bedtime. 05/03/15   Carlus Pavlov, MD  Insulin Pen Needle 31G X 5 MM MISC Use 4x a day 01/18/14   Newt Lukes, MD  metFORMIN (GLUCOPHAGE-XR) 500 MG 24 hr tablet Take 2 tablets (1,000 mg total) by mouth 2 (two) times daily. 01/18/14   Newt Lukes, MD  metFORMIN (GLUCOPHAGE-XR) 500 MG 24 hr tablet Take 2 tablets (1,000 mg total) by mouth 2 (two) times daily with a meal. 07/22/15   Carlus Pavlov, MD  metoprolol succinate (TOPROL-XL) 50 MG 24 hr tablet Take 1 tablet (50 mg total) by mouth daily. Need annual exam for additional refills 05/14/15   Shirline Frees, NP  Multiple Vitamins-Minerals (MULTIVITAMIN WITH MINERALS) tablet Take 1 tablet by mouth daily.    Historical Provider, MD  potassium chloride SA (KLOR-CON M20) 20 MEQ tablet Take 1 tablet (20 mEq total) by mouth daily. 05/14/15   Shirline Frees, NP  Pseudoephedrine HCl (SUDAFED 12 HOUR PO) Take 1 tablet by mouth every 6 (six) hours as needed. Reported on 05/03/2015    Historical Provider, MD  torsemide (DEMADEX) 20 MG tablet Take 1 tablet (20 mg total) by mouth 2 (two) times daily. 05/14/15   Shirline Frees, NP  triamcinolone cream (KENALOG) 0.1 % Apply  1 application topically 2 (two) times daily. 05/14/15   Shirline Frees, NP  valsartan (DIOVAN) 320 MG tablet Take 1 tablet (320 mg total) by mouth daily. 05/14/15   Shirline Frees, NP   BP 96/61 mmHg  Pulse 91  Temp(Src) 98.1 F (36.7 C) (Oral)  Resp 18  Ht 5\' 4"  (1.626 m)  Wt 129.275 kg  BMI 48.90 kg/m2  SpO2 98%  LMP 07/06/2015 Physical Exam  Constitutional: She is oriented to person, place, and time. She appears well-developed and well-nourished.  HENT:  Head: Normocephalic and atraumatic.  Right Ear: External ear normal.  Left Ear: External ear normal.  Bilateral maxillary sinus  tenderness  Eyes: Conjunctivae are normal. Pupils are equal, round, and reactive to light.  Neck: Normal range of motion.  Cardiovascular: Normal rate, regular rhythm and normal heart sounds.   Pulmonary/Chest: Effort normal and breath sounds normal. No respiratory distress. She has no wheezes.  Abdominal: Soft. Bowel sounds are normal.  Musculoskeletal: Normal range of motion.  Neurological: She is alert and oriented to person, place, and time.  Skin: Skin is warm.    ED Course  Procedures (including critical care time) Labs Review Labs Reviewed  CBC - Abnormal; Notable for the following:    Hemoglobin 10.5 (*)    HCT 33.9 (*)    MCH 25.2 (*)    RDW 16.2 (*)    All other components within normal limits  BASIC METABOLIC PANEL - Abnormal; Notable for the following:    Potassium 3.2 (*)    Glucose, Bld 155 (*)    All other components within normal limits  RAPID STREP SCREEN (NOT AT [facility])  CULTURE, GROUP A STREP [facility])    Imaging Review Dg Chest 2 View  07/22/2015  CLINICAL DATA:  50 year old female with 8 day history of cough and congestion EXAM: CHEST  2 VIEW COMPARISON:  Prior chest x-ray and acute abdominal series 112 2015 FINDINGS: The lungs are clear and negative for focal airspace consolidation, pulmonary edema or suspicious pulmonary nodule. No pleural effusion or pneumothorax. Cardiac  and mediastinal contours are within normal limits. No acute fracture or lytic or blastic osseous lesions. The visualized upper abdominal bowel gas pattern is unremarkable. IMPRESSION: Negative chest x-ray Electronically Signed   By: Malachy Moan M.D.   On: 07/22/2015 12:44   I have personally reviewed and evaluated these images and lab results as part of my medical decision-making.   EKG Interpretation None      MDM   Final diagnoses:  Acute recurrent maxillary sinusitis    50 year old female presenting for fever and congestion for 1 week with sinus tenderness. In setting of recent sinus infection there is concerned for bacterial sinusitis. Chest x-ray, rapid strep negative. Remainder of physical exam benign, no acute distress, no respiratory distress. With discharged home with Augmentin for sinusitis treatment. Patient counseled to follow with PCP. ED return precautions discussed.   Tamyah Cutbirth A. Kennon Rounds MD, MS Family Medicine Resident PGY-2 Pager [phone]    Bonney Aid, MD 07/22/15 1538  Marily Memos, MD 07/24/15 [phone]

## 2015-07-25 LAB — CULTURE, GROUP A STREP (THRC)

## 2015-08-01 ENCOUNTER — Ambulatory Visit: Payer: BLUE CROSS/BLUE SHIELD | Admitting: Internal Medicine

## 2015-08-01 DIAGNOSIS — Z0289 Encounter for other administrative examinations: Secondary | ICD-10-CM

## 2015-08-07 ENCOUNTER — Other Ambulatory Visit: Payer: Self-pay | Admitting: Internal Medicine

## 2015-08-21 ENCOUNTER — Other Ambulatory Visit: Payer: Self-pay | Admitting: Adult Health

## 2015-08-22 NOTE — Telephone Encounter (Signed)
Dr. Fry patient. 

## 2015-09-01 ENCOUNTER — Other Ambulatory Visit: Payer: Self-pay | Admitting: Adult Health

## 2015-09-11 ENCOUNTER — Ambulatory Visit (INDEPENDENT_AMBULATORY_CARE_PROVIDER_SITE_OTHER): Payer: BLUE CROSS/BLUE SHIELD | Admitting: Adult Health

## 2015-09-11 ENCOUNTER — Other Ambulatory Visit: Payer: Self-pay | Admitting: Adult Health

## 2015-09-11 ENCOUNTER — Encounter: Payer: Self-pay | Admitting: Adult Health

## 2015-09-11 VITALS — BP 118/80 | Temp 98.0°F | Wt 278.3 lb

## 2015-09-11 DIAGNOSIS — R300 Dysuria: Secondary | ICD-10-CM

## 2015-09-11 LAB — POCT URINALYSIS DIPSTICK
BILIRUBIN UA: NEGATIVE
KETONES UA: NEGATIVE
Leukocytes, UA: NEGATIVE
Protein, UA: NEGATIVE
RBC UA: NEGATIVE
SPEC GRAV UA: 1.02
UROBILINOGEN UA: 1
pH, UA: 6

## 2015-09-11 LAB — GLUCOSE, POCT (MANUAL RESULT ENTRY): POC Glucose: 162 mg/dl — AB (ref 70–99)

## 2015-09-11 MED ORDER — PHENAZOPYRIDINE HCL 200 MG PO TABS: 200.0000 mg | ORAL_TABLET | Freq: Three times a day (TID) | ORAL | Status: DC | PRN

## 2015-09-11 NOTE — Progress Notes (Signed)
   Subjective:    Patient ID: Heather Myers, female    DOB: 07-May-1965, 50 y.o.   MRN: [id number]  HPI  50 year old female, patient of Dr. Clent Ridges. Presents to the clinic today for UTI type symptoms. Her symptoms started 4 days ago with burning, incomplete bladder emptying and a " pressure like feeling" She has been taking Uri-stat for the last 24 hours with relief. When the uristate wears off, her symptoms return.   She denies any fevers, urinary frequency. Vaginal discharge, back pain.   Review of Systems  Constitutional: Negative.   Gastrointestinal: Positive for abdominal pain (left groin ). Negative for nausea, vomiting, diarrhea, constipation and abdominal distention.  Genitourinary: Positive for dysuria, urgency, decreased urine volume and pelvic pain. Negative for frequency, hematuria, flank pain, vaginal discharge and vaginal pain.  All other systems reviewed and are negative.      Objective:   Physical Exam  Constitutional: She is oriented to person, place, and time. She appears well-developed and well-nourished. No distress.  Cardiovascular: Normal rate, regular rhythm, normal heart sounds and intact distal pulses.  Exam reveals no gallop and no friction rub.   No murmur heard. Pulmonary/Chest: Effort normal and breath sounds normal. No respiratory distress. She has no wheezes. She has no rales. She exhibits no tenderness.  Abdominal: Soft. Bowel sounds are normal. She exhibits no distension and no mass. There is tenderness in the suprapubic area. There is no rebound, no guarding and no CVA tenderness.    Neurological: She is alert and oriented to person, place, and time.  Skin: Skin is warm and dry. No rash noted. She is not diaphoretic. No erythema. No pallor.  Psychiatric: She has a normal mood and affect. Her behavior is normal. Judgment and thought content normal.  Nursing note and vitals reviewed.     Assessment & Plan:  1. Dysuria - UA negative for leukocytes. She  does appear dehydrated and has glucose in her urine. Dysuria maybe due to dehydration. Cannot rule out IC - Culture, Urine - phenazopyridine (PYRIDIUM) 200 MG tablet; Take 1 tablet (200 mg total) by mouth 3 (three) times daily as needed for pain.  Dispense: 10 tablet; Refill: 1 - POC Urinalysis Dipstick + nitrates, + glucose, SG 1.020 - POCT glucose (manual entry) - Will follow up with her about culture results.  - Force fluids  - Follow up with PCP as needed  Shirline Frees, NP

## 2015-09-11 NOTE — Patient Instructions (Addendum)
It was great seeing you again!  I will send a urine culture and let you know as soon as I get it back.   Take the pyridium up to three times a day   Force fluids   Dysuria Dysuria is pain or discomfort while urinating. The pain or discomfort may be felt in the tube that carries urine out of the bladder (urethra) or in the surrounding tissue of the genitals. The pain may also be felt in the groin area, lower abdomen, and lower back. You may have to urinate frequently or have the sudden feeling that you have to urinate (urgency). Dysuria can affect both men and women, but is more common in women. Dysuria can be caused by many different things, including:  Urinary tract infection in women.  Infection of the kidney or bladder.  Kidney stones or bladder stones.  Certain sexually transmitted infections (STIs), such as chlamydia.  Dehydration.  Inflammation of the vagina.  Use of certain medicines.  Use of certain soaps or scented products that cause irritation. HOME CARE INSTRUCTIONS Watch your dysuria for any changes. The following actions may help to reduce any discomfort you are feeling:  Drink enough fluid to keep your urine clear or pale yellow.  Empty your bladder often. Avoid holding urine for long periods of time.  After a bowel movement or urination, women should cleanse from front to back, using each tissue only once.  Empty your bladder after sexual intercourse.  Take medicines only as directed by your health care provider.  If you were prescribed an antibiotic medicine, finish it all even if you start to feel better.  Avoid caffeine, tea, and alcohol. They can irritate the bladder and make dysuria worse. In men, alcohol may irritate the prostate.  Keep all follow-up visits as directed by your health care provider. This is important.  If you had any tests done to find the cause of dysuria, it is your responsibility to obtain your test results. Ask the lab or  department performing the test when and how you will get your results. Talk with your health care provider if you have any questions about your results. SEEK MEDICAL CARE IF:  You develop pain in your back or sides.  You have a fever.  You have nausea or vomiting.  You have blood in your urine.  You are not urinating as often as you usually do. SEEK IMMEDIATE MEDICAL CARE IF:  You pain is severe and not relieved with medicines.  You are unable to hold down any fluids.  You or someone else notices a change in your mental function.  You have a rapid heartbeat at rest.  You have shaking or chills.  You feel extremely weak.   This information is not intended to replace advice given to you by your health care provider. Make sure you discuss any questions you have with your health care provider.   Document Released: 01/10/2004 Document Revised: 05/04/2014 Document Reviewed: 12/07/2013 Elsevier Interactive Patient Education [employer]

## 2015-09-13 ENCOUNTER — Telehealth: Payer: Self-pay | Admitting: Adult Health

## 2015-09-13 ENCOUNTER — Other Ambulatory Visit: Payer: Self-pay | Admitting: Adult Health

## 2015-09-13 LAB — URINE CULTURE

## 2015-09-13 MED ORDER — AMITRIPTYLINE HCL 25 MG PO TABS: 25.0000 mg | ORAL_TABLET | Freq: Every day | ORAL | Status: DC

## 2015-09-13 NOTE — Telephone Encounter (Signed)
Pt would like results of Uti test yesterday.  Pt states she is still in a lot of pain. Please call back asap

## 2015-09-13 NOTE — Telephone Encounter (Signed)
I have sent in a prescription for Amitriptyline, this may help with the pain from bladder spasms. Take it at night, as it will make you sleepy.   If no improvement, then follow up with Dr. Caryl Never.

## 2015-09-13 NOTE — Telephone Encounter (Signed)
Patient notified and verbalized understanding. 

## 2015-09-13 NOTE — Telephone Encounter (Signed)
Patient notified of lab results. Please advise 

## 2015-09-17 ENCOUNTER — Ambulatory Visit (INDEPENDENT_AMBULATORY_CARE_PROVIDER_SITE_OTHER): Payer: BLUE CROSS/BLUE SHIELD | Admitting: Gynecology

## 2015-09-17 ENCOUNTER — Encounter: Payer: Self-pay | Admitting: Gynecology

## 2015-09-17 VITALS — BP 124/78

## 2015-09-17 DIAGNOSIS — N3001 Acute cystitis with hematuria: Secondary | ICD-10-CM

## 2015-09-17 DIAGNOSIS — IMO0001 Reserved for inherently not codable concepts without codable children: Secondary | ICD-10-CM

## 2015-09-17 DIAGNOSIS — R300 Dysuria: Secondary | ICD-10-CM | POA: Diagnosis not present

## 2015-09-17 DIAGNOSIS — R350 Frequency of micturition: Secondary | ICD-10-CM | POA: Diagnosis not present

## 2015-09-17 LAB — URINALYSIS W MICROSCOPIC + REFLEX CULTURE
Bilirubin Urine: NEGATIVE
Casts: NONE SEEN [LPF]
Crystals: NONE SEEN [HPF]
Ketones, ur: NEGATIVE
NITRITE: POSITIVE — AB
PROTEIN: NEGATIVE
Specific Gravity, Urine: 1.01 (ref 1.001–1.035)
YEAST: NONE SEEN [HPF]
pH: 5.5 (ref 5.0–8.0)

## 2015-09-17 MED ORDER — NITROFURANTOIN MONOHYD MACRO 100 MG PO CAPS: 100.0000 mg | ORAL_CAPSULE | Freq: Two times a day (BID) | ORAL | Status: DC

## 2015-09-17 MED ORDER — PHENAZOPYRIDINE HCL 200 MG PO TABS: 200.0000 mg | ORAL_TABLET | Freq: Three times a day (TID) | ORAL | Status: DC | PRN

## 2015-09-17 NOTE — Addendum Note (Signed)
Addended by: Dayna Barker on: 09/17/2015 02:39 PM   Modules accepted: Orders, SmartSet

## 2015-09-17 NOTE — Patient Instructions (Signed)
Phenazopyridine tablets What is this medicine? PHENAZOPYRIDINE (fen az oh PEER i deen) is a pain reliever. It is used to stop the pain, burning, or discomfort caused by infection or irritation of the urinary tract. This medicine is not an antibiotic. It will not cure a urinary tract infection. This medicine may be used for other purposes; ask your health care provider or pharmacist if you have questions. What should I tell my health care provider before I take this medicine? They need to know if you have any of these conditions: -glucose-6-phosphate dehydrogenase (G6PD) deficiency -kidney disease -an unusual or allergic reaction to phenazopyridine, other medicines, foods, dyes, or preservatives -pregnant or trying to get pregnant -breast-feeding How should I use this medicine? Take this medicine by mouth with a glass of water. Follow the directions on the prescription label. Take after meals. Take your doses at regular intervals. Do not take your medicine more often than directed. Do not skip doses or stop your medicine early even if you feel better. Do not stop taking except on your doctor's advice. Talk to your pediatrician regarding the use of this medicine in children. Special care may be needed. Overdosage: If you think you have taken too much of this medicine contact a poison control center or emergency room at once. NOTE: This medicine is only for you. Do not share this medicine with others. What if I miss a dose? If you miss a dose, take it as soon as you can. If it is almost time for your next dose, take only that dose. Do not take double or extra doses. What may interact with this medicine? Interactions are not expected. This list may not describe all possible interactions. Give your health care provider a list of all the medicines, herbs, non-prescription drugs, or dietary supplements you use. Also tell them if you smoke, drink alcohol, or use illegal drugs. Some items may interact  with your medicine. What should I watch for while using this medicine? Tell your doctor or health care professional if your symptoms do not improve or if they get worse. This medicine colors body fluids red. This effect is harmless and will go away after you are done taking the medicine. It will change urine to an dark orange or red color. The red color may stain clothing. Soft contact lenses may become permanently stained. It is best not to wear soft contact lenses while taking this medicine. If you are diabetic you may get a false positive result for sugar in your urine. Talk to your health care provider. What side effects may I notice from receiving this medicine? Side effects that you should report to your doctor or health care professional as soon as possible: -allergic reactions like skin rash, itching or hives, swelling of the face, lips, or tongue -blue or purple color of the skin -difficulty breathing -fever -less urine -unusual bleeding, bruising -unusual tired, weak -vomiting -yellowing of the eyes or skin Side effects that usually do not require medical attention (report to your doctor or health care professional if they continue or are bothersome): -dark urine -headache -stomach upset This list may not describe all possible side effects. Call your doctor for medical advice about side effects. You may report side effects to FDA at 1-800-FDA-1088. Where should I keep my medicine? Keep out of the reach of children. Store at room temperature between 15 and 30 degrees C (59 and 86 degrees F). Protect from light and moisture. Throw away any unused medicine after the   expiration date. NOTE: This sheet is a summary. It may not cover all possible information. If you have questions about this medicine, talk to your doctor, pharmacist, or health care provider.    2016, Elsevier/Gold Standard. (2007-11-10 11:04:07) Nitrofurantoin tablets or capsules What is this medicine? NITROFURANTOIN  (nye troe fyoor AN toyn) is an antibiotic. It is used to treat urinary tract infections. This medicine may be used for other purposes; ask your health care provider or pharmacist if you have questions. What should I tell my health care provider before I take this medicine? They need to know if you have any of these conditions: -anemia -diabetes -glucose-6-phosphate dehydrogenase deficiency -kidney disease -liver disease -lung disease -other chronic illness -an unusual or allergic reaction to nitrofurantoin, other antibiotics, other medicines, foods, dyes or preservatives -pregnant or trying to get pregnant -breast-feeding How should I use this medicine? Take this medicine by mouth with a glass of water. Follow the directions on the prescription label. Take this medicine with food or milk. Take your doses at regular intervals. Do not take your medicine more often than directed. Do not stop taking except on your doctor's advice. Talk to your pediatrician regarding the use of this medicine in children. While this drug may be prescribed for selected conditions, precautions do apply. Overdosage: If you think you have taken too much of this medicine contact a poison control center or emergency room at once. NOTE: This medicine is only for you. Do not share this medicine with others. What if I miss a dose? If you miss a dose, take it as soon as you can. If it is almost time for your next dose, take only that dose. Do not take double or extra doses. What may interact with this medicine? -antacids containing magnesium trisilicate -probenecid -quinolone antibiotics like ciprofloxacin, lomefloxacin, norfloxacin and ofloxacin -sulfinpyrazone This list may not describe all possible interactions. Give your health care provider a list of all the medicines, herbs, non-prescription drugs, or dietary supplements you use. Also tell them if you smoke, drink alcohol, or use illegal drugs. Some items may  interact with your medicine. What should I watch for while using this medicine? Tell your doctor or health care professional if your symptoms do not improve or if you get new symptoms. Drink several glasses of water a day. If you are taking this medicine for a long time, visit your doctor for regular checks on your progress. If you are diabetic, you may get a false positive result for sugar in your urine with certain brands of urine tests. Check with your doctor. What side effects may I notice from receiving this medicine? Side effects that you should report to your doctor or health care professional as soon as possible: -allergic reactions like skin rash or hives, swelling of the face, lips, or tongue -chest pain -cough -difficulty breathing -dizziness, drowsiness -fever or infection -joint aches or pains -pale or blue-tinted skin -redness, blistering, peeling or loosening of the skin, including inside the mouth -tingling, burning, pain, or numbness in hands or feet -unusual bleeding or bruising -unusually weak or tired -yellowing of eyes or skin Side effects that usually do not require medical attention (report to your doctor or health care professional if they continue or are bothersome): -dark urine -diarrhea -headache -loss of appetite -nausea or vomiting -temporary hair loss This list may not describe all possible side effects. Call your doctor for medical advice about side effects. You may report side effects to FDA at 1-800-FDA-1088. Where   should I keep my medicine? Keep out of the reach of children. Store at room temperature between 15 and 30 degrees C (59 and 86 degrees F). Protect from light. Throw away any unused medicine after the expiration date. NOTE: This sheet is a summary. It may not cover all possible information. If you have questions about this medicine, talk to your doctor, pharmacist, or health care provider.    2016, Elsevier/Gold Standard. (2007-11-02  15:56:47) Urinary Tract Infection Urinary tract infections (UTIs) can develop anywhere along your urinary tract. Your urinary tract is your body's drainage system for removing wastes and extra water. Your urinary tract includes two kidneys, two ureters, a bladder, and a urethra. Your kidneys are a pair of bean-shaped organs. Each kidney is about the size of your fist. They are located below your ribs, one on each side of your spine. CAUSES Infections are caused by microbes, which are microscopic organisms, including fungi, viruses, and bacteria. These organisms are so small that they can only be seen through a microscope. Bacteria are the microbes that most commonly cause UTIs. SYMPTOMS  Symptoms of UTIs may vary by age and gender of the patient and by the location of the infection. Symptoms in young women typically include a frequent and intense urge to urinate and a painful, burning feeling in the bladder or urethra during urination. Older women and men are more likely to be tired, shaky, and weak and have muscle aches and abdominal pain. A fever may mean the infection is in your kidneys. Other symptoms of a kidney infection include pain in your back or sides below the ribs, nausea, and vomiting. DIAGNOSIS To diagnose a UTI, your caregiver will ask you about your symptoms. Your caregiver will also ask you to provide a urine sample. The urine sample will be tested for bacteria and white blood cells. White blood cells are made by your body to help fight infection. TREATMENT  Typically, UTIs can be treated with medication. Because most UTIs are caused by a bacterial infection, they usually can be treated with the use of antibiotics. The choice of antibiotic and length of treatment depend on your symptoms and the type of bacteria causing your infection. HOME CARE INSTRUCTIONS  If you were prescribed antibiotics, take them exactly as your caregiver instructs you. Finish the medication even if you feel  better after you have only taken some of the medication.  Drink enough water and fluids to keep your urine clear or pale yellow.  Avoid caffeine, tea, and carbonated beverages. They tend to irritate your bladder.  Empty your bladder often. Avoid holding urine for long periods of time.  Empty your bladder before and after sexual intercourse.  After a bowel movement, women should cleanse from front to back. Use each tissue only once. SEEK MEDICAL CARE IF:   You have back pain.  You develop a fever.  Your symptoms do not begin to resolve within 3 days. SEEK IMMEDIATE MEDICAL CARE IF:   You have severe back pain or lower abdominal pain.  You develop chills.  You have nausea or vomiting.  You have continued burning or discomfort with urination. MAKE SURE YOU:   Understand these instructions.  Will watch your condition.  Will get help right away if you are not doing well or get worse.   This information is not intended to replace advice given to you by your health care provider. Make sure you discuss any questions you have with your health care provider.     Document Released: 01/21/2005 Document Revised: 01/02/2015 Document Reviewed: 05/22/2011 Elsevier Interactive Patient Education 2016 Elsevier Inc.  

## 2015-09-17 NOTE — Progress Notes (Signed)
   HPI: Patient is a 50 year old that presented to the office stating that for several days now she's been complaining of dysuria and frequency any heaviness sensation. She had gone to her primary care physician and was given Pyridium and a weighted for her urine culture which she was informed was negative. Patient denies any fever, chills, nausea, or vomiting. She does have a Mirena IUD that was placed in 2014. She reports no irregular bleeding. Her PCP is treating her for type 2 diabetes as well as hyperlipidemia. She denies any GI complaints. No change in appetite.   ROS: A ROS was performed and pertinent positives and negatives are included in the history.  GENERAL: No fevers or chills. HEENT: No change in vision, no earache, sore throat or sinus congestion. NECK: No pain or stiffness. CARDIOVASCULAR: No chest pain or pressure. No palpitations. PULMONARY: No shortness of breath, cough or wheeze. GASTROINTESTINAL: No abdominal pain, nausea, vomiting or diarrhea, melena or bright red blood per rectum. GENITOURINARY: No urinary frequency, urgency, hesitancy or dysuria. MUSCULOSKELETAL: No joint or muscle pain, no back pain, no recent trauma. DERMATOLOGIC: No rash, no itching, no lesions. ENDOCRINE: No polyuria, polydipsia, no heat or cold intolerance. No recent change in weight. HEMATOLOGICAL: No anemia or easy bruising or bleeding. NEUROLOGIC: No headache, seizures, numbness, tingling or weakness. PSYCHIATRIC: No depression, no loss of interest in normal activity or change in sleep pattern.   PE: Well-developed well-nourished overweight female with the above-mentioned complaints Abdomen: Soft nontender no rebound or guarding Pelvic: Bartholin urethra Skene was within normal limits Vagina: No lesions or discharge Cervix: No lesions or discharge uterus upper limits of normal slight fun all tenderness near the bladder region but no rebound or guarding Rectal exam: Not done  Urinalysis: Many bacteria,  3-10 RBC, packed WBC    Assessment Plan: Clinical evidence of urinary tract infection will be treated with Macrobid one by mouth twice a day for 7 days. For bladder spasm she'll be prescribed Pyridium 200 mg one by mouth 3 times a day for 3 days. In the event of a yeast infection she was also prescribed Diflucan 150 mg one by mouth. She was encouraged to increase her fluid intake.    Greater than 50% of time was spent in counseling and coordinating care of this patient.   Time of consultation: 15   Minutes.

## 2015-09-20 LAB — URINE CULTURE: Colony Count: >=100000

## 2015-09-23 ENCOUNTER — Other Ambulatory Visit: Payer: Self-pay | Admitting: Adult Health

## 2015-09-30 ENCOUNTER — Other Ambulatory Visit: Payer: Self-pay | Admitting: Adult Health

## 2015-10-04 ENCOUNTER — Other Ambulatory Visit: Payer: Self-pay | Admitting: Adult Health

## 2015-10-04 NOTE — Telephone Encounter (Signed)
Pt has seen cory several times. Pt was a leschber pt. Pt has been waiting to get establish with Dr [place].  Pt was given 30 day of citalopram (CELEXA) 40 MG tablet  on 5/8 by [facility] and did not realize it was a 30 day only. Pt has made establish appointment with Dr Sw[place]and on Tues 6/13, but is out of her citalopram (CELEXA) 40 MG tablet today. Pt would like enough to get her through to her appointment, because she cannot go without this med. Can you send to  [facility]

## 2015-10-04 NOTE — Telephone Encounter (Signed)
Ok to refill for 30 days  

## 2015-10-04 NOTE — Telephone Encounter (Signed)
See below

## 2015-10-04 NOTE — Telephone Encounter (Signed)
Ok to refill 

## 2015-10-04 NOTE — Telephone Encounter (Signed)
Ok to refill for one month  

## 2015-10-08 ENCOUNTER — Ambulatory Visit: Payer: BLUE CROSS/BLUE SHIELD | Admitting: Family Medicine

## 2015-10-08 DIAGNOSIS — Z0289 Encounter for other administrative examinations: Secondary | ICD-10-CM

## 2015-10-17 ENCOUNTER — Other Ambulatory Visit: Payer: Self-pay | Admitting: Internal Medicine

## 2015-10-21 ENCOUNTER — Encounter: Payer: Self-pay | Admitting: Gynecology

## 2015-10-21 DIAGNOSIS — Z0289 Encounter for other administrative examinations: Secondary | ICD-10-CM

## 2015-11-11 ENCOUNTER — Other Ambulatory Visit: Payer: Self-pay | Admitting: Family

## 2015-11-11 ENCOUNTER — Ambulatory Visit: Payer: BLUE CROSS/BLUE SHIELD | Admitting: Family Medicine

## 2015-11-12 ENCOUNTER — Encounter: Payer: Self-pay | Admitting: Family Medicine

## 2015-11-12 ENCOUNTER — Ambulatory Visit: Payer: BLUE CROSS/BLUE SHIELD | Admitting: Family Medicine

## 2015-11-12 ENCOUNTER — Ambulatory Visit (INDEPENDENT_AMBULATORY_CARE_PROVIDER_SITE_OTHER): Payer: BLUE CROSS/BLUE SHIELD | Admitting: Family Medicine

## 2015-11-12 VITALS — BP 120/68 | HR 104 | Temp 97.8°F | Ht 64.0 in | Wt 284.0 lb

## 2015-11-12 DIAGNOSIS — F32A Depression, unspecified: Secondary | ICD-10-CM

## 2015-11-12 DIAGNOSIS — F329 Major depressive disorder, single episode, unspecified: Secondary | ICD-10-CM

## 2015-11-12 DIAGNOSIS — L309 Dermatitis, unspecified: Secondary | ICD-10-CM

## 2015-11-12 DIAGNOSIS — F418 Other specified anxiety disorders: Secondary | ICD-10-CM | POA: Diagnosis not present

## 2015-11-12 DIAGNOSIS — F419 Anxiety disorder, unspecified: Secondary | ICD-10-CM

## 2015-11-12 DIAGNOSIS — R002 Palpitations: Secondary | ICD-10-CM

## 2015-11-12 DIAGNOSIS — Z760 Encounter for issue of repeat prescription: Secondary | ICD-10-CM

## 2015-11-12 MED ORDER — METOPROLOL SUCCINATE ER 50 MG PO TB24: 50.0000 mg | ORAL_TABLET | Freq: Every day | ORAL | Status: DC

## 2015-11-12 MED ORDER — CITALOPRAM HYDROBROMIDE 40 MG PO TABS: ORAL_TABLET | ORAL | Status: DC

## 2015-11-12 MED ORDER — CLOBETASOL PROPIONATE 0.05 % EX CREA: 1.0000 "application " | TOPICAL_CREAM | Freq: Two times a day (BID) | CUTANEOUS | Status: DC

## 2015-11-12 NOTE — Progress Notes (Signed)
HPI:  Heather Myers is a pleasant 50 yo here for an acute visit for several issues. She reports she used to see Dr. Felicity Coyer and is planning to establish care with Dr. Swaziland.   1)Rash: -x3 years -extensor surfaces legs and elbows, intensely pruritic -saw her prior primary care doc, derm remotely and several providers here for this  -tried several topical steroids and oral steroids once - helped but did not resolve -scratches chronically -no fevers, malaise, hx bxs  2)palpitations/htn: -chronic -just ran out metoprolol and needs refill or "will die" -no cp, sob, palpitations  3)GAD/Depression: -chronic -just ran out celexa, reports has been on 10 years, needs refill -no anxiety, depression if on medication -gets irritable when stops medication  ROS: See pertinent positives and negatives per HPI.  Past Medical History  Diagnosis Date  . Diabetes mellitus type 2 in obese (HCC)   . Hypertension   . Depression   . Anxiety   . Hyperlipidemia   . Anemia   . Allergic rhinitis   . Morbid obesity [facility])     Past Surgical History  Procedure Laterality Date  . Cholecystectomy    . Cesarean section      x's 2  . Rso    . Lipoma excision  08/03/2011    Procedure: EXCISION LIPOMA;  Surgeon: Shelly Rubenstein, MD;  Location: 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 Abdominal surgery      chronic abd wall abscess  . Oophorectomy      R ovary removed at [facility] History  Problem Relation Age of Onset  . Diabetes Mother   . Diabetes Other   . Other Neg Hx     Social History   Social History  . Marital Status: Married    Spouse Name: N/A  . Number of Children: N/A  . Years of Education: N/A   Social History Main Topics  . Smoking status: Never Smoker   . Smokeless tobacco: Never Used     Comment: Married  . Alcohol Use: No  . Drug Use: No  . Sexual Activity: Not  Currently   Other Topics Concern  . None   Social History Narrative     Current outpatient prescriptions:  .  albuterol (PROAIR HFA) 108 (90 Base) MCG/ACT inhaler, Inhale 2 puffs into the lungs 4 (four) times daily., Disp: 18 g, Rfl: 3 .  amitriptyline (ELAVIL) 25 MG tablet, Take 1 tablet (25 mg total) by mouth at bedtime., Disp: 30 tablet, Rfl: 0 .  atorvastatin (LIPITOR) 10 MG tablet, Take 1 tablet (10 mg total) by mouth daily. Need annual exam for additional refills, Disp: 90 tablet, Rfl: 0 .  BAYER BREEZE 2 TEST DISK, CHECK BLOOD SUGAR UP TO 6 TIMES DAILY, Disp: 200 each, Rfl: 3 .  BAYER MICROLET LANCETS lancets, by Other route. Use as instructed, Disp: , Rfl:  .  canagliflozin (INVOKANA) 300 MG TABS tablet, Take 300 mg by mouth daily., Disp: 90 tablet, Rfl: 3 .  citalopram (CELEXA) 40 MG tablet, Further refills from new PCP., Disp: 30 tablet, Rfl: 0 .  esomeprazole (NEXIUM) 20 MG packet, Take 20 mg by mouth daily before breakfast., Disp: 90 each, Rfl: 0 .  Famotidine (PEPCID AC PO), Take 1 tablet by mouth every 6 (six) hours as needed. Reported on 05/03/2015, Disp: , Rfl:  .  fluticasone (FLONASE) 50 MCG/ACT nasal spray, Place 2  sprays into both nostrils daily., Disp: 16 g, Rfl: 3 .  fluticasone (FLOVENT HFA) 44 MCG/ACT inhaler, Inhale 1 puff into the lungs 2 (two) times daily. Need annual exam for additional refills, Disp: 31.8 Inhaler, Rfl: 1 .  Glucose Blood (BAYER BREEZE 2 TEST VI), by In Vitro route., Disp: , Rfl:  .  hydroquinone 4 % cream, APPLY TO AFFECTED AREA TWICE A DAY, Disp: 28.35 g, Rfl: 0 .  Insulin Pen Needle 31G X 5 MM MISC, Use 4x a day, Disp: 400 each, Rfl: 3 .  KLOR-CON M20 20 MEQ tablet, TAKE 1 TABLET (20 MEQ TOTAL) BY MOUTH DAILY., Disp: 90 tablet, Rfl: 0 .  metFORMIN (GLUCOPHAGE-XR) 500 MG 24 hr tablet, Take 2 tablets (1,000 mg total) by mouth 2 (two) times daily., Disp: 360 tablet, Rfl: 3 .  metFORMIN (GLUCOPHAGE-XR) 500 MG 24 hr tablet, Take 2 tablets (1,000 mg  total) by mouth 2 (two) times daily with a meal., Disp: 360 tablet, Rfl: 1 .  metoprolol succinate (TOPROL-XL) 50 MG 24 hr tablet, Take 1 tablet (50 mg total) by mouth daily., Disp: 30 tablet, Rfl: 1 .  Multiple Vitamins-Minerals (MULTIVITAMIN WITH MINERALS) tablet, Take 1 tablet by mouth daily., Disp: , Rfl:  .  nitrofurantoin, macrocrystal-monohydrate, (MACROBID) 100 MG capsule, Take 1 capsule (100 mg total) by mouth 2 (two) times daily., Disp: 14 capsule, Rfl: 0 .  NOVOLOG FLEXPEN 100 UNIT/ML FlexPen, INJECT 25 UNITS INTO THE SKIN 3 (THREE) TIMES DAILY WITH MEALS., Disp: 90 mL, Rfl: 1 .  phenazopyridine (PYRIDIUM) 200 MG tablet, Take 1 tablet (200 mg total) by mouth 3 (three) times daily as needed for pain., Disp: 10 tablet, Rfl: 1 .  phenazopyridine (PYRIDIUM) 200 MG tablet, Take 1 tablet (200 mg total) by mouth 3 (three) times daily as needed for pain., Disp: 9 tablet, Rfl: 0 .  Pseudoephedrine HCl (SUDAFED 12 HOUR PO), Take 1 tablet by mouth every 6 (six) hours as needed. Reported on 05/03/2015, Disp: , Rfl:  .  torsemide (DEMADEX) 20 MG tablet, TAKE 1 TABLET (20 MG TOTAL) BY MOUTH 2 (TWO) TIMES DAILY., Disp: 90 tablet, Rfl: 0 .  TOUJEO SOLOSTAR 300 UNIT/ML SOPN, INJECT 75 UNITS INTO THE SKIN AT BEDTIME., Disp: 9 pen, Rfl: 0 .  triamcinolone cream (KENALOG) 0.1 %, Apply 1 application topically 2 (two) times daily., Disp: 30 g, Rfl: 0 .  valsartan (DIOVAN) 320 MG tablet, TAKE 1 TABLET (320 MG TOTAL) BY MOUTH DAILY., Disp: 90 tablet, Rfl: 0 .  clobetasol cream (TEMOVATE) 0.05 %, Apply 1 application topically 2 (two) times daily., Disp: 30 g, Rfl: 0  EXAM:  Filed Vitals:   11/12/15 1408  BP: 120/68  Pulse: 104  Temp: 97.8 F (36.6 C)    Body mass index is 48.72 kg/(m^2).  GENERAL: vitals reviewed and listed above, alert, oriented, appears well hydrated and in no acute distress  HEENT: atraumatic, conjunttiva clear, no obvious abnormalities on inspection of external nose and ears  NECK:  no obvious masses on inspection  LUNGS: clear to auscultation bilaterally, no wheezes, rales or rhonchi, good air movement  CV: HRRR, no peripheral edema  MS: moves all extremities without noticeable abnormality  PSYCH: pleasant and cooperative, no obvious depression or anxiety  ASSESSMENT AND PLAN:  Discussed the following assessment and plan:  Dermatitis  Palpitations  Anxiety and depression  Medication refill - Plan: metoprolol succinate (TOPROL-XL) 50 MG 24 hr tablet  -see pt instructions -Patient advised to return or notify a doctor  immediately if symptoms worsen or persist or new concerns arise.  Patient Instructions  BEFORE YOU LEAVE: -follow up: schedule new patient visit with Dr. Thomasene Lot in the next 2 months  For the rash: -call today to schedule an appointment with dermatology  -apply Cerave cream 1-2 times daily -zyrtec daily -use the clobetasol cream 1-2 times daily for 2 weeks only on the thick dry areas of the skin  I sent a small amount of the medications you requested to the pharmacy. Further refills should come from your new primary doctor.      Kriste Basque R., DO

## 2015-11-12 NOTE — Progress Notes (Signed)
Pre visit review using our clinic review tool, if applicable. No additional management support is needed unless otherwise documented below in the visit note. 

## 2015-11-12 NOTE — Patient Instructions (Signed)
BEFORE YOU LEAVE: -follow up: schedule new patient visit with Dr. Thomasene Lot in the next 2 months  For the rash: -call today to schedule an appointment with dermatology  -apply Cerave cream 1-2 times daily -zyrtec daily -use the clobetasol cream 1-2 times daily for 2 weeks only on the thick dry areas of the skin  I sent a small amount of the medications you requested to the pharmacy. Further refills should come from your new primary doctor.

## 2015-12-01 DIAGNOSIS — L709 Acne, unspecified: Secondary | ICD-10-CM | POA: Diagnosis not present

## 2015-12-01 DIAGNOSIS — J019 Acute sinusitis, unspecified: Secondary | ICD-10-CM | POA: Diagnosis not present

## 2015-12-07 ENCOUNTER — Other Ambulatory Visit: Payer: Self-pay | Admitting: Adult Health

## 2015-12-09 NOTE — Telephone Encounter (Signed)
Okay to refill until patient comes in?

## 2015-12-10 NOTE — Telephone Encounter (Signed)
This is a patient of Dr. Swaziland

## 2015-12-10 NOTE — Telephone Encounter (Signed)
Pt has an appt with dr Swaziland 12-16-15

## 2015-12-10 NOTE — Telephone Encounter (Signed)
Has appt 12/16/15.

## 2015-12-12 ENCOUNTER — Other Ambulatory Visit: Payer: Self-pay | Admitting: Family Medicine

## 2015-12-15 ENCOUNTER — Other Ambulatory Visit: Payer: Self-pay | Admitting: Family Medicine

## 2015-12-16 ENCOUNTER — Ambulatory Visit: Payer: BLUE CROSS/BLUE SHIELD | Admitting: Family Medicine

## 2015-12-19 ENCOUNTER — Ambulatory Visit (INDEPENDENT_AMBULATORY_CARE_PROVIDER_SITE_OTHER): Payer: BLUE CROSS/BLUE SHIELD | Admitting: Family Medicine

## 2015-12-19 ENCOUNTER — Encounter: Payer: Self-pay | Admitting: Family Medicine

## 2015-12-19 ENCOUNTER — Telehealth: Payer: Self-pay

## 2015-12-19 VITALS — BP 124/78 | HR 92 | Ht 64.0 in | Wt 287.2 lb

## 2015-12-19 DIAGNOSIS — Z6841 Body Mass Index (BMI) 40.0 and over, adult: Secondary | ICD-10-CM | POA: Diagnosis not present

## 2015-12-19 DIAGNOSIS — I10 Essential (primary) hypertension: Secondary | ICD-10-CM

## 2015-12-19 DIAGNOSIS — R600 Localized edema: Secondary | ICD-10-CM

## 2015-12-19 DIAGNOSIS — E1165 Type 2 diabetes mellitus with hyperglycemia: Secondary | ICD-10-CM | POA: Diagnosis not present

## 2015-12-19 DIAGNOSIS — L309 Dermatitis, unspecified: Secondary | ICD-10-CM

## 2015-12-19 DIAGNOSIS — Z794 Long term (current) use of insulin: Secondary | ICD-10-CM

## 2015-12-19 DIAGNOSIS — H6011 Cellulitis of right external ear: Secondary | ICD-10-CM

## 2015-12-19 DIAGNOSIS — D649 Anemia, unspecified: Secondary | ICD-10-CM

## 2015-12-19 DIAGNOSIS — J4520 Mild intermittent asthma, uncomplicated: Secondary | ICD-10-CM

## 2015-12-19 DIAGNOSIS — Z760 Encounter for issue of repeat prescription: Secondary | ICD-10-CM

## 2015-12-19 DIAGNOSIS — L811 Chloasma: Secondary | ICD-10-CM

## 2015-12-19 DIAGNOSIS — IMO0002 Reserved for concepts with insufficient information to code with codable children: Secondary | ICD-10-CM | POA: Insufficient documentation

## 2015-12-19 DIAGNOSIS — F419 Anxiety disorder, unspecified: Secondary | ICD-10-CM

## 2015-12-19 HISTORY — DX: Mild intermittent asthma, uncomplicated: J45.20

## 2015-12-19 LAB — CBC
HEMATOCRIT: 33.9 % — AB (ref 36.0–46.0)
HEMOGLOBIN: 10.9 g/dL — AB (ref 12.0–15.0)
MCHC: 32.1 g/dL (ref 30.0–36.0)
MCV: 77.7 fl — ABNORMAL LOW (ref 78.0–100.0)
PLATELETS: 572 10*3/uL — AB (ref 150.0–400.0)
RBC: 4.36 Mil/uL (ref 3.87–5.11)
RDW: 17.2 % — AB (ref 11.5–15.5)
WBC: 10.1 10*3/uL (ref 4.0–10.5)

## 2015-12-19 LAB — BASIC METABOLIC PANEL WITH GFR
BUN: 13 mg/dL (ref 6–23)
CO2: 30 meq/L (ref 19–32)
Calcium: 9.4 mg/dL (ref 8.4–10.5)
Chloride: 101 meq/L (ref 96–112)
Creatinine, Ser: 0.63 mg/dL (ref 0.40–1.20)
GFR: 129.64 mL/min
Glucose, Bld: 130 mg/dL — ABNORMAL HIGH (ref 70–99)
Potassium: 3.9 meq/L (ref 3.5–5.1)
Sodium: 138 meq/L (ref 135–145)

## 2015-12-19 LAB — FERRITIN: Ferritin: 4.5 ng/mL — ABNORMAL LOW (ref 10.0–291.0)

## 2015-12-19 LAB — HEMOGLOBIN A1C: HEMOGLOBIN A1C: 7.3 % — AB (ref 4.6–6.5)

## 2015-12-19 MED ORDER — CLOBETASOL PROPIONATE 0.05 % EX CREA: 1.0000 "application " | TOPICAL_CREAM | Freq: Two times a day (BID) | CUTANEOUS | 1 refills | Status: DC

## 2015-12-19 MED ORDER — CANAGLIFLOZIN 300 MG PO TABS: 300.0000 mg | ORAL_TABLET | Freq: Every day | ORAL | 3 refills | Status: DC

## 2015-12-19 MED ORDER — DOXYCYCLINE HYCLATE 100 MG PO TABS: 100.0000 mg | ORAL_TABLET | Freq: Two times a day (BID) | ORAL | 0 refills | Status: AC

## 2015-12-19 MED ORDER — INSULIN GLARGINE 300 UNIT/ML ~~LOC~~ SOPN: 75.0000 [IU] | PEN_INJECTOR | Freq: Every day | SUBCUTANEOUS | 3 refills | Status: DC

## 2015-12-19 MED ORDER — ATORVASTATIN CALCIUM 10 MG PO TABS: 10.0000 mg | ORAL_TABLET | Freq: Every day | ORAL | 0 refills | Status: DC

## 2015-12-19 MED ORDER — METOPROLOL SUCCINATE ER 50 MG PO TB24: 50.0000 mg | ORAL_TABLET | Freq: Every day | ORAL | 1 refills | Status: DC

## 2015-12-19 MED ORDER — CITALOPRAM HYDROBROMIDE 40 MG PO TABS: ORAL_TABLET | ORAL | 2 refills | Status: DC

## 2015-12-19 MED ORDER — VALSARTAN 320 MG PO TABS: ORAL_TABLET | ORAL | 2 refills | Status: DC

## 2015-12-19 MED ORDER — ALBUTEROL SULFATE HFA 108 (90 BASE) MCG/ACT IN AERS: 2.0000 | INHALATION_SPRAY | Freq: Four times a day (QID) | RESPIRATORY_TRACT | 3 refills | Status: DC

## 2015-12-19 MED ORDER — HYDROQUINONE 4 % EX CREA: TOPICAL_CREAM | CUTANEOUS | 2 refills | Status: DC

## 2015-12-19 MED ORDER — METFORMIN HCL ER 500 MG PO TB24: 1000.0000 mg | ORAL_TABLET | Freq: Two times a day (BID) | ORAL | 3 refills | Status: DC

## 2015-12-19 MED ORDER — FUROSEMIDE 20 MG PO TABS: 20.0000 mg | ORAL_TABLET | Freq: Every day | ORAL | 3 refills | Status: DC | PRN

## 2015-12-19 MED ORDER — INSULIN ASPART 100 UNIT/ML FLEXPEN: PEN_INJECTOR | SUBCUTANEOUS | 1 refills | Status: DC

## 2015-12-19 NOTE — Progress Notes (Signed)
HPI:   [name] is a 50 y.o. female, who is here today to establish care with me.  Former PCP: Dr Heather Myers   Concerns today: right ear problem. According to pt, for a month she has had erythema and pain that started right retroauricular and now extending to upper and anterior aspect of right auricula. She denies any injury. "hurst unbelievable", constant, throbbing. Exacerbated with palpation. No alleviating factors identified.  No earache, hearing loss, or ear drainage.  She has tried local heat,ice, and OTC cortisone. Edema is better for the past 2 days but pain is not.  She also needs to follow on some of her chronic medical problems. She needs refills on all her medications.   -She is requesting refills on Clobetasol cream, which was recommended recently for pruritic rash she has had on pretibial areas, started about 4 years ago and has tried several topical steroids with no major improvement. Clobetasol has helped greatly. Dx with eczema.  Also needs Hydroquinone cream for facial hyperpigmentation, ? Melasma.  She is requesting referral to dermatologists that "has experience with ethnic skin", she is requesting a african Theatre stage manager.  -LE edema: Currently she is on Torsemide for HTN and LE edema, she has been on this med for years. She takes 20 mg bid. HypoK+ on KCL 20 meq daily.    Hypertension:    Currently on Diovan 325 mg daily and Metoprolol  Succinate 50 mg daily. Last BMP 06/2015 K+ 3.2. + OSA, according to pt, CPAP was not recommended.   She is taking medications as instructed, no side effects reported.  She has not noted unusual headache, visual changes, exertional chest pain, dyspnea,  focal weakness, or edema.  Diabetes Mellitus II:    Currently on Toujeo 75 U daily, Metformin XR 2000 mg daily, Invokana 300 mg daily, and Novolog 25 U tid. Problem has been stable. Checking BS's : 200's Hypoglycemia: occasionally,  usually at night, about once per month, low 60's.  She is tolerating medications well. She denies abdominal pain, nausea, vomiting, polydipsia, polyuria, or polyphagia. No numbness, tingling, or burning.  Concerned about some side effects she has heard about Invokana.  She does not follow dietary recommendations and she does not exercise regularly.  Asthma: She is on Albuterol inh, which she uses as needed, mainly during Spring. Takes OTC Sudafed as needed for nasal congestion.  Anxiety: She has been on Celexa 40 mg for about 11 years. She has tried to wean it off but feels anxious when she does so. Denies suicidal thoughts or hospitalizations due to psychiatric conditions.   HLD, she is on Lipitor 10 mg daily. Lab Results  Component Value Date   CHOL 196 11/16/2013   HDL 35.80 (L) 11/16/2013   LDLCALC 126 (H) 11/16/2013   TRIG 172.0 (H) 11/16/2013   CHOLHDL 5 11/16/2013   Noted H/H 10.5/33.9 in 06/2015. Denies blood in stool, gross hematuria, or melena. No changes in bowel habits.  LMP 12/17/15. She follows with gyn for her routine female preventive care.   Review of Systems  Constitutional: Negative for activity change, appetite change, fatigue, fever and unexpected weight change.  HENT: Negative for dental problem, mouth sores, nosebleeds and trouble swallowing.   Eyes: Negative for redness and visual disturbance.  Respiratory: Negative for cough, shortness of breath and wheezing.   Cardiovascular: Positive for leg swelling. Negative for chest pain and palpitations.  Gastrointestinal: Negative for abdominal pain, nausea and vomiting.  Negative for changes in bowel habits.  Endocrine: Negative for polydipsia, polyphagia and polyuria.  Genitourinary: Negative for decreased urine volume, difficulty urinating and hematuria.  Musculoskeletal: Negative for gait problem and myalgias.  Skin: Positive for rash. Negative for wound.  Allergic/Immunologic: Positive for  environmental allergies.  Neurological: Negative for seizures, syncope, weakness, numbness and headaches.  Psychiatric/Behavioral: Negative for confusion and suicidal ideas. The patient is nervous/anxious.       Current Outpatient Prescriptions on File Prior to Visit  Medication Sig Dispense Refill  . BAYER BREEZE 2 TEST DISK CHECK BLOOD SUGAR UP TO 6 TIMES DAILY 200 each 3  . BAYER MICROLET LANCETS lancets by Other route. Use as instructed    . esomeprazole (NEXIUM) 20 MG packet Take 20 mg by mouth daily before breakfast. 90 each 0  . Famotidine (PEPCID AC PO) Take 1 tablet by mouth every 6 (six) hours as needed. Reported on 05/03/2015    . fluticasone (FLOVENT HFA) 44 MCG/ACT inhaler Inhale 1 puff into the lungs 2 (two) times daily. Need annual exam for additional refills 31.8 Inhaler 1  . Glucose Blood (BAYER BREEZE 2 TEST VI) by In Vitro route.    . Insulin Pen Needle 31G X 5 MM MISC Use 4x a day 400 each 3  . KLOR-CON M20 20 MEQ tablet TAKE 1 TABLET (20 MEQ TOTAL) BY MOUTH DAILY. 90 tablet 0  . Multiple Vitamins-Minerals (MULTIVITAMIN WITH MINERALS) tablet Take 1 tablet by mouth daily.    . Pseudoephedrine HCl (SUDAFED 12 HOUR PO) Take 1 tablet by mouth every 6 (six) hours as needed. Reported on 05/03/2015     No current facility-administered medications on file prior to visit.      Past Medical History:  Diagnosis Date  . Allergic rhinitis   . Anemia   . Anxiety   . Asthma, mild intermittent 12/19/2015  . Depression   . Diabetes mellitus type 2 in obese (HCC)   . Hyperlipidemia   . Hypertension   . Morbid obesity (HCC)    Allergies  Allergen Reactions  . Ramipril Shortness Of Breath    Family History  Problem Relation Age of Onset  . Diabetes Mother   . Diabetes Other   . Other Neg Hx     Social History   Social History  . Marital status: Married    Spouse name: N/A  . Number of children: N/A  . Years of education: N/A   Social History Main Topics  . Smoking  status: Never Smoker  . Smokeless tobacco: Never Used     Comment: Married  . Alcohol use No  . Drug use: No  . Sexual activity: Not Currently   Other Topics Concern  . None   Social History Narrative  . None    Vitals:   12/19/15 1318  BP: 124/78  Pulse: 92    Body mass index is 49.31 kg/m.  O2 sat 99%    Physical Exam  Nursing note and vitals reviewed. Constitutional: She is oriented to person, place, and time. She appears well-developed. No distress.  HENT:  Head: Atraumatic.  Mouth/Throat: Oropharynx is clear and moist and mucous membranes are normal.  Eyes: Conjunctivae and EOM are normal. Pupils are equal, round, and reactive to light.  Neck: No thyroid mass and no thyromegaly present.  Cardiovascular: Normal rate and regular rhythm.   No murmur heard. Pulses:      Dorsalis pedis pulses are 2+ on the right side, and 2+ on the  left side.  HR 88/min by my count.  Respiratory: Effort normal and breath sounds normal. No respiratory distress.  GI: Soft. She exhibits no mass. There is no hepatomegaly. There is no tenderness.  Musculoskeletal: She exhibits edema (trace pitting LE edema bilateral). She exhibits no tenderness.  Lymphadenopathy:       Head (right side): No preauricular and no posterior auricular adenopathy present.       Head (left side): No preauricular and no posterior auricular adenopathy present.    She has no cervical adenopathy.  Neurological: She is alert and oriented to person, place, and time. She has normal strength. Coordination and gait normal.  Skin: Skin is warm. No petechiae noted. Rash is not vesicular.  Right auricula with local heat, erythema, no edema. Papular lesion , 3 mm, not induration and no fluctuating area. Tender with light palpation.  Facial white comedones and hyperpigmentation/ post inflammatory changes. On pretibial area bilateral no erythema or rash, post inflammatory hypigmentation changes.  Psychiatric: Her mood  appears anxious.  Well groomed, good eye contact.   Diabetic foot exam:  Monofilament normal bilateral. Peripheral pulses present (DP). No calluses No hypertrophic/long toenails.    ASSESSMENT AND PLAN:     Heather Myers was seen today for transfer.  Diagnoses and all orders for this visit: Lab Results  Component Value Date   HGBA1C 7.3 (H) 12/19/2015   Lab Results  Component Value Date   WBC 10.1 12/19/2015   HGB 10.9 (L) 12/19/2015   HCT 33.9 (L) 12/19/2015   MCV 77.7 (L) 12/19/2015   PLT 572.0 (H) 12/19/2015   Lab Results  Component Value Date   CREATININE 0.63 12/19/2015   BUN 13 12/19/2015   NA 138 12/19/2015   K 3.9 12/19/2015   CL 101 12/19/2015   CO2 30 12/19/2015    Insulin dependent type 2 diabetes mellitus, uncontrolled (HCC)   HgA1C pending. No changes in current management, will adjust medications according to HgA1C. Regular exercise and healthy diet with avoidance of added sugar food intake is an important part of treatment and recommended. Explained that all meds have side effects. She is more concerned about Invokana, seems to be an increase of amputations. Explained there was an observational conclusion on recent study, more toe amputations, decreased CV events.  Annual eye exam, periodic dental and foot care recommended. F/U in 3-4 months   -     Hemoglobin A1c -     Basic metabolic panel -     canagliflozin (INVOKANA) 300 MG TABS tablet; Take 1 tablet (300 mg total) by mouth daily. -     metFORMIN (GLUCOPHAGE-XR) 500 MG 24 hr tablet; Take 2 tablets (1,000 mg total) by mouth 2 (two) times daily. -     insulin aspart (NOVOLOG FLEXPEN) 100 UNIT/ML FlexPen; INJECT 25 UNITS INTO THE SKIN 3 (THREE) TIMES DAILY 15 min before MEALS. -     Insulin Glargine (TOUJEO SOLOSTAR) 300 UNIT/ML SOPN; Inject 75 Units into the skin daily.  Essential hypertension  Adequately controlled. No changes in current management. DASH diet recommended. Eye exam  recommended annually. F/U in 3-4 months, before if needed.  -     Basic metabolic panel -     metoprolol succinate (TOPROL-XL) 50 MG 24 hr tablet; Take 1 tablet (50 mg total) by mouth daily. -     valsartan (DIOVAN) 320 MG tablet; TAKE 1 TABLET (320 MG TOTAL) BY MOUTH DAILY.  Asthma, mild intermittent, uncomplicated  Well controlled. No changes in current  management. F/U in a year, before if needed.  -     albuterol (PROAIR HFA) 108 (90 Base) MCG/ACT inhaler; Inhale 2 puffs into the lungs 4 (four) times daily.  BMI 45.0-49.9, adult (HCC)  We discussed benefits of wt loss as well as adverse effects of obesity.Some hypoglycemic medications could be decreased or even d/c if she lose wt.   Consistency with healthy diet and physical activity recommended.   Melasma  No changes in current management. Sun screening and avoidance of direct sun light.  -     hydroquinone 4 % cream; APPLY TO AFFECTED AREA TWICE A DAY  Eczema  Improved. Some side effects of topical steroid discussed.  -     clobetasol cream (TEMOVATE) 0.05 %; Apply 1 application topically 2 (two) times daily. Up to 2 weeks at the time   Bilateral lower extremity edema  Denies any Hx of CHF or CKD. 07/2010 echo LVEF 55-65% and grade I diastolic dysfunction. Some side effects of diuretics discussed. I recommend changing to Furosemide and take it as needed. LE elevation and compression stocking as well as wt loss may help.  She has not taken KCL in a few days, so if K+ in normal range she can continue K+ rich diet.   -     furosemide (LASIX) 20 MG tablet; Take 1 tablet (20 mg total) by mouth daily as needed for edema.  Anemia, unspecified  Further recommendations will be given. Colonoscopy to be considered.  -     CBC -     Ferritin  Cellulitis of helix of right ear  Local heat. Oral Abx recommended. Instructed about warning signs. F/U as needed.   -     doxycycline (VIBRA-TABS) 100 MG tablet; Take 1  tablet (100 mg total) by mouth 2 (two) times daily.  Anxiety disorder, unspecified  Stable. No changes for now. May try to decrease dose in a few months.  -     citalopram (CELEXA) 40 MG tablet; 1 tab daily     -She does not want a standard dermatology referral, she will let me know who she would like to see that meet her requirements.   OV face to face from 1:46 to 2:28 pm. We dedicated more than 50% of the time in discussion of some of her mediations side effects, disease management, and plan of care.          Betty G. Swaziland, MD  [facility]. [place] office.

## 2015-12-19 NOTE — Patient Instructions (Addendum)
A few things to remember from today's visit:   Essential hypertension - Plan: Basic metabolic panel  Insulin dependent type 2 diabetes mellitus, uncontrolled (HCC) - Plan: Hemoglobin A1c, Basic metabolic panel, insulin aspart (NOVOLOG FLEXPEN) 100 UNIT/ML FlexPen, Insulin Glargine (TOUJEO SOLOSTAR) 300 UNIT/ML SOPN  Asthma, mild intermittent, uncomplicated - Plan: albuterol (PROAIR HFA) 108 (90 Base) MCG/ACT inhaler  BMI 45.0-49.9, adult (HCC)  Melasma - Plan: hydroquinone 4 % cream  Eczema - Plan: clobetasol cream (TEMOVATE) 0.05 %  Medication refill - Plan: albuterol (PROAIR HFA) 108 (90 Base) MCG/ACT inhaler, atorvastatin (LIPITOR) 10 MG tablet, metoprolol succinate (TOPROL-XL) 50 MG 24 hr tablet  Bilateral lower extremity edema - Plan: furosemide (LASIX) 20 MG tablet  Anemia, unspecified - Plan: CBC, Ferritin  Cellulitis of helix of right ear - Plan: doxycycline (VIBRA-TABS) 100 MG tablet  Fluid pill was changed, take it as needed. Depending of potassium level. We will resume potassium. Please let me know when you find a dermatologist.     HgA1C goal < 7.0. Avoid sugar added food:regular soft drinks, energy drinks, and sports drinks. candy. cakes. cookies. pies and cobblers. sweet rolls, pastries, and donuts. fruit drinks, such as fruitades and fruit punch. dairy desserts, such as ice cream  Mediterranean diet has showed benefits for sugar control.  How much and what type of carbohydrate foods are important for managing diabetes. The balance between how much insulin is in your body and the carbohydrate you eat makes a difference in your blood glucose levels.  Fasting blood sugar ideally 130 or less, 2 hours after meals less than 180.   Regular exercise also will help with controlling disease, daily brisk walking as tolerated for 15-30 min definitively will help.   Avoid skipping meals, blood sugar might drop and cause serious problems. Remember checking feet  periodically, good dental hygiene, and annual eye exam.    We have ordered labs or studies at this visit.  It can take up to 1-2 weeks for results and processing. IF results require follow up or explanation, we will call you with instructions. Clinically stable results will be released to your [facility]. If you have not heard from [place] or cannot find your results in [facility] in 2 weeks please contact our office at [phone].  If you are not yet signed up for [facility], please consider signing up    Please be sure medication list is accurate. If a new problem present, please set up appointment sooner than planned today.

## 2015-12-19 NOTE — Progress Notes (Signed)
Pre visit review using our clinic review tool, if applicable. No additional management support is needed unless otherwise documented below in the visit note. 

## 2015-12-19 NOTE — Telephone Encounter (Signed)
Received PA request from [facility] pharmacy for Clobetasol Propionate Cream. PA Submitted & is pending. Key: [id number]

## 2015-12-20 NOTE — Telephone Encounter (Signed)
PA approved through 12/17/2016. Pharmacy aware & will fill for patient.

## 2015-12-22 ENCOUNTER — Encounter: Payer: Self-pay | Admitting: Family Medicine

## 2015-12-23 ENCOUNTER — Other Ambulatory Visit: Payer: Self-pay | Admitting: Adult Health

## 2016-01-30 ENCOUNTER — Encounter: Payer: Self-pay | Admitting: Family Medicine

## 2016-01-30 ENCOUNTER — Encounter: Payer: Self-pay | Admitting: Internal Medicine

## 2016-01-30 ENCOUNTER — Ambulatory Visit (INDEPENDENT_AMBULATORY_CARE_PROVIDER_SITE_OTHER): Payer: BLUE CROSS/BLUE SHIELD | Admitting: Family Medicine

## 2016-01-30 VITALS — BP 130/78 | HR 108 | Temp 98.0°F | Resp 12 | Ht 64.0 in | Wt 286.5 lb

## 2016-01-30 DIAGNOSIS — R0609 Other forms of dyspnea: Secondary | ICD-10-CM | POA: Diagnosis not present

## 2016-01-30 DIAGNOSIS — E876 Hypokalemia: Secondary | ICD-10-CM | POA: Diagnosis not present

## 2016-01-30 DIAGNOSIS — R000 Tachycardia, unspecified: Secondary | ICD-10-CM

## 2016-01-30 DIAGNOSIS — I10 Essential (primary) hypertension: Secondary | ICD-10-CM | POA: Diagnosis not present

## 2016-01-30 DIAGNOSIS — R0600 Dyspnea, unspecified: Secondary | ICD-10-CM

## 2016-01-30 DIAGNOSIS — D509 Iron deficiency anemia, unspecified: Secondary | ICD-10-CM

## 2016-01-30 MED ORDER — METOPROLOL SUCCINATE ER 50 MG PO TB24: 75.0000 mg | ORAL_TABLET | Freq: Every day | ORAL | 1 refills | Status: DC

## 2016-01-30 MED ORDER — VALSARTAN 320 MG PO TABS: ORAL_TABLET | ORAL | 2 refills | Status: DC

## 2016-01-30 MED ORDER — METOPROLOL SUCCINATE ER 50 MG PO TB24: 100.0000 mg | ORAL_TABLET | Freq: Every day | ORAL | 1 refills | Status: DC

## 2016-01-30 NOTE — Patient Instructions (Addendum)
A few things to remember from today's visit:   Exertional dyspnea - Plan: Ambulatory referral to Cardiology, Brain Natriuretic Peptide, Basic Metabolic Panel, DG Chest 2 View, CANCELED: EKG 12-Lead  Essential hypertension - Plan: Basic Metabolic Panel, valsartan (DIOVAN) 320 MG tablet, metoprolol succinate (TOPROL-XL) 50 MG 24 hr tablet  Sinus tachycardia - Plan: Ambulatory referral to Cardiology, EKG 12-Lead,  metoprolol succinate (TOPROL-XL) 50 MG 24 hr tablet 1.5 tab daily   Hypokalemia  Tachycardia can be caused or aggravated by low blood pressure and dehydration.  Cardiology appointment will be arranged. Continue low salt diet. If chest pain associated with shortness of breath, clammy sensation, sudden worsening symptoms please seek immediate medical attention.  Please be sure medication list is accurate. If a new problem present, please set up appointment sooner than planned today.

## 2016-01-30 NOTE — Progress Notes (Signed)
[name] is a 50 y.o.female, who is here today to follow on HTN, concerend about elevated BP readings at home for the past week and "high pulse." BP readings up to 145/80's. She states that it used to be low 100's/70's.  HR up to 125/min, intermittently.  She this symptoms are related to changes in diuretic treatment. I saw her on 12/19/15, when she reported being on Torsemide 20 mg bid, which she also was taking for HTN. Hx of hypoK+.  I did changed to Furosemide and as needed, recommended LE elevation and to consider compression stockings.  She  is very concerned because since her diuretic was changed she is "retaining fluids" , which she can feel in her upper chest, she hasn't noted lower extremity edema. She also mentions that since medication changes she doesn't urinate as much as she used to, she denies gross hematuria or foamy urine.  Sleeping on 3 pillows now because she feels "more comfortable" doing so, denies orthopnea or PND. She also has hx of GERD.  Exertional dyspnea and palpitations daily for the past couple weeks, she denies chest pain or diaphoresis. Palpitations also happen at rest, seem worse at night when lying down; reporting Hx of elevated HR in the past.  She has Hx of "very mild" OSA, no CPAP recommended. She denies any cough or wheezing. She has history of allergic rhinitis and has had some nasal congestion. She denies any fever or chills, no sick contact.  She was not taking her KCL last OV,  I recommended not to resume because K+ level was in normal range and because diuretic was changed, she resumed and is still taking it.  She doesn't weigh herself at home but she feels like she has gained weight. She has seen a cardiologist before, 5-6 years ago, reporting a stress test done.     Currently she is on Diovan 320 mg daily, Metoprolol Succinate 50 mg daily. Hx of DM II, FG 125-130, post prandial 140-160. She is on insulin, Metformin, and  Invokana.    She has not noted unusual headache, visual changes,  focal weakness, or worsening edema.    Lab Results  Component Value Date   CREATININE 0.63 12/19/2015   BUN 13 12/19/2015   NA 138 12/19/2015   K 3.9 12/19/2015   CL 101 12/19/2015   CO2 30 12/19/2015    Also Hx of iron deficiency anemia, stable when last checked, recommended colonoscopy.She tells me that she was not aare of lab results.  Lab Results  Component Value Date   WBC 10.1 12/19/2015   HGB 10.9 (L) 12/19/2015   HCT 33.9 (L) 12/19/2015   MCV 77.7 (L) 12/19/2015   PLT 572.0 (H) 12/19/2015    Denies abdominal pain, nausea, vomiting, changes in bowel habits, blood in stool or melena.   Review of Systems  Constitutional: Positive for fatigue and unexpected weight change. Negative for activity change, appetite change, diaphoresis and fever.  HENT: Positive for congestion. Negative for mouth sores, nosebleeds, sore throat and trouble swallowing.   Eyes: Negative for pain, redness and visual disturbance.  Respiratory: Positive for shortness of breath. Negative for apnea, cough, wheezing and stridor.   Cardiovascular: Positive for palpitations. Negative for chest pain and leg swelling.  Gastrointestinal: Negative for abdominal pain, blood in stool, nausea and vomiting.       Negative for changes in bowel habits.  Endocrine: Negative for cold intolerance and heat intolerance.  Genitourinary: Negative for decreased urine  volume, difficulty urinating, dysuria and hematuria.  Skin: Negative for color change and rash.  Allergic/Immunologic: Positive for environmental allergies.  Neurological: Negative for seizures, syncope, weakness, numbness and headaches.  Hematological: Negative for adenopathy. Does not bruise/bleed easily.  Psychiatric/Behavioral: Negative for confusion and sleep disturbance. The patient is nervous/anxious.      Current Outpatient Prescriptions on File Prior to Visit  Medication Sig  Dispense Refill  . albuterol (PROAIR HFA) 108 (90 Base) MCG/ACT inhaler Inhale 2 puffs into the lungs 4 (four) times daily. 18 g 3  . atorvastatin (LIPITOR) 10 MG tablet Take 1 tablet (10 mg total) by mouth daily. Need annual exam for additional refills 90 tablet 0  . BAYER BREEZE 2 TEST DISK CHECK BLOOD SUGAR UP TO 6 TIMES DAILY 200 each 3  . BAYER MICROLET LANCETS lancets by Other route. Use as instructed    . canagliflozin (INVOKANA) 300 MG TABS tablet Take 1 tablet (300 mg total) by mouth daily. 90 tablet 3  . citalopram (CELEXA) 40 MG tablet 1 tab daily 90 tablet 2  . clobetasol cream (TEMOVATE) 0.05 % Apply 1 application topically 2 (two) times daily. Up to 2 weeks at the time 45 g 1  . esomeprazole (NEXIUM) 20 MG packet Take 20 mg by mouth daily before breakfast. 90 each 0  . Famotidine (PEPCID AC PO) Take 1 tablet by mouth every 6 (six) hours as needed. Reported on 05/03/2015    . fluticasone (FLOVENT HFA) 44 MCG/ACT inhaler Inhale 1 puff into the lungs 2 (two) times daily. Need annual exam for additional refills 31.8 Inhaler 1  . furosemide (LASIX) 20 MG tablet Take 1 tablet (20 mg total) by mouth daily as needed for edema. 45 tablet 3  . Glucose Blood (BAYER BREEZE 2 TEST VI) by In Vitro route.    . hydroquinone 4 % cream APPLY TO AFFECTED AREA TWICE A DAY 28.35 g 2  . insulin aspart (NOVOLOG FLEXPEN) 100 UNIT/ML FlexPen INJECT 25 UNITS INTO THE SKIN 3 (THREE) TIMES DAILY 15 min before MEALS. 90 mL 1  . Insulin Glargine (TOUJEO SOLOSTAR) 300 UNIT/ML SOPN Inject 75 Units into the skin daily. 9 pen 3  . Insulin Pen Needle 31G X 5 MM MISC Use 4x a day 400 each 3  . KLOR-CON M20 20 MEQ tablet TAKE 1 TABLET (20 MEQ TOTAL) BY MOUTH DAILY. 90 tablet 0  . metFORMIN (GLUCOPHAGE-XR) 500 MG 24 hr tablet Take 2 tablets (1,000 mg total) by mouth 2 (two) times daily. 360 tablet 3  . metoprolol succinate (TOPROL-XL) 50 MG 24 hr tablet Take 1 tablet (50 mg total) by mouth daily. 30 tablet 1  . Multiple  Vitamins-Minerals (MULTIVITAMIN WITH MINERALS) tablet Take 1 tablet by mouth daily.    . Pseudoephedrine HCl (SUDAFED 12 HOUR PO) Take 1 tablet by mouth every 6 (six) hours as needed. Reported on 05/03/2015    . valsartan (DIOVAN) 320 MG tablet TAKE 1 TABLET (320 MG TOTAL) BY MOUTH DAILY. 90 tablet 2   No current facility-administered medications on file prior to visit.      Past Medical History:  Diagnosis Date  . Allergic rhinitis   . Anemia   . Anxiety   . Asthma, mild intermittent 12/19/2015  . Depression   . Diabetes mellitus type 2 in obese (HCC)   . Hyperlipidemia   . Hypertension   . Morbid obesity (HCC)     Allergies  Allergen Reactions  . Ramipril Shortness Of Breath  Social History   Social History  . Marital status: Married    Spouse name: N/A  . Number of children: N/A  . Years of education: N/A   Social History Main Topics  . Smoking status: Never Smoker  . Smokeless tobacco: Never Used     Comment: Married  . Alcohol use No  . Drug use: No  . Sexual activity: Not Currently   Other Topics Concern  . None   Social History Narrative  . None    Vitals:   01/30/16 1105  BP: 130/78  Pulse: (!) 108  Resp: 12  Temp: 98 F (36.7 C)   O2 sat at RA 98%   Body mass index is 49.18 kg/m.  Wt Readings from Last 3 Encounters:  01/30/16 286 lb 8 oz (130 kg)  12/19/15 287 lb 4 oz (130.3 kg)  11/12/15 284 lb (128.8 kg)     Physical Exam  Nursing note and vitals reviewed. Constitutional: She is oriented to person, place, and time. She appears well-developed. She does not appear ill. No distress.  HENT:  Head: Atraumatic.  Mouth/Throat: Oropharynx is clear and moist and mucous membranes are normal.  Eyes: Conjunctivae and EOM are normal. Pupils are equal, round, and reactive to light.  Neck: No JVD present.  Cardiovascular: Regular rhythm.  Tachycardia present.   No murmur heard. Pulses:      Dorsalis pedis pulses are 2+ on the right side, and  2+ on the left side.  Respiratory: Effort normal and breath sounds normal. No stridor. No respiratory distress.  GI: Soft. She exhibits no mass. There is no hepatomegaly. There is no tenderness.  Musculoskeletal: She exhibits edema (Trace pitting edema bilateral LE). She exhibits no tenderness.  Lymphadenopathy:    She has no cervical adenopathy.  Neurological: She is alert and oriented to person, place, and time. She has normal strength. Coordination and gait normal.  Skin: Skin is warm. No rash noted. No erythema.  Psychiatric: Her mood appears anxious.  Well groomed, good eye contact.     ASSESSMENT AND PLAN:   Heather Myers was seen today for follow-up.  Diagnoses and all orders for this visit:  Exertional dyspnea  Seems like this problem has ben intermittently for years, she has had some work-up done in the past.  Possible causes discussed: allergies, obesity (hypoventilation synd), pulmonary, cardiac among some. Today on examination I don't appreciate any sign that suggest a serious illness or acute CHF. Wt has been stable since her last OV. Clearly instructed about warning signs.  Lexiscan nuclear stress test neg 07/2010. Echo 07/2010 normal LVEF, grade I diastolic dysfunction.  EKG today sinus tach resolved. SR, normal axis and intervals, LAE. No acute ischemia appreciated and no major changes when compared with EKG 04/2013 except for tachycardia not present.  CTA 10/2012 done because dyspnea and chest pain: No PE, no other explanation for chest pain/shortness of breath. pulmonary arterial hypertension, and cardiomegaly.  -     EKG 12-Lead -     Ambulatory referral to Cardiology -     Brain Natriuretic Peptide -     Basic Metabolic Panel  Essential hypertension  Seems adequately controlled. She re-checked with her wrist BP monitor: 109/66 and I rechecked 115/73.  Because having episodes of tachycardia, Metoprolol Succinate increased from 50 mg to 75 mg. Diovan decreased  from 320 mg to 160 mg. Continue monitoring BP at home.  DASH-low salt diet recommended.  F/U in a week, before if needed.  -  Basic Metabolic Panel  Sinus tachycardia  We discussed possible causes as dehydration,hypotension,anxiety,cardiac disease,medications, electrolyte abnormality among some. For now no changes in Furosemide. Metoprolol Succinate increase. Continue monitoring at home. Instructed about warning signs.  -     Ambulatory referral to Cardiology  Hypokalemia  Some adverse effects of diuretic treatment discussed. Further recommendations would be given according to lab results.  Because Iron deficiency anemia colonoscopy recommended, she agrees, so GI referral was placed.   11:30 am 12:41, face to face 34 min.   -Ms. Alandria Butkiewicz was advised to return sooner than planned today if new concerns arise.     Aileana Hodder G. [place], MD  [facility]. [place] office.

## 2016-02-06 ENCOUNTER — Ambulatory Visit: Payer: BLUE CROSS/BLUE SHIELD | Admitting: Family Medicine

## 2016-02-06 ENCOUNTER — Other Ambulatory Visit (INDEPENDENT_AMBULATORY_CARE_PROVIDER_SITE_OTHER): Payer: BLUE CROSS/BLUE SHIELD

## 2016-02-06 DIAGNOSIS — Z0289 Encounter for other administrative examinations: Secondary | ICD-10-CM

## 2016-02-06 DIAGNOSIS — Q738 Other reduction defects of unspecified limb(s): Secondary | ICD-10-CM | POA: Diagnosis not present

## 2016-02-06 LAB — BASIC METABOLIC PANEL
BUN: 13 mg/dL (ref 6–23)
CHLORIDE: 102 meq/L (ref 96–112)
CO2: 28 meq/L (ref 19–32)
CREATININE: 0.61 mg/dL (ref 0.40–1.20)
Calcium: 9.1 mg/dL (ref 8.4–10.5)
GFR: 134.48 mL/min (ref >60.00)
GLUCOSE: 179 mg/dL — AB (ref 70–99)
POTASSIUM: 4.1 meq/L (ref 3.5–5.1)
Sodium: 136 mEq/L (ref 135–145)

## 2016-02-06 LAB — BRAIN NATRIURETIC PEPTIDE: PRO B NATRI PEPTIDE: 23 pg/mL (ref 0.0–100.0)

## 2016-02-08 ENCOUNTER — Encounter: Payer: Self-pay | Admitting: Family Medicine

## 2016-02-21 ENCOUNTER — Encounter: Payer: Self-pay | Admitting: Family Medicine

## 2016-02-21 ENCOUNTER — Ambulatory Visit (INDEPENDENT_AMBULATORY_CARE_PROVIDER_SITE_OTHER): Payer: BLUE CROSS/BLUE SHIELD | Admitting: Family Medicine

## 2016-02-21 VITALS — BP 110/65 | HR 96 | Resp 12 | Ht 64.0 in | Wt 287.2 lb

## 2016-02-21 DIAGNOSIS — Z23 Encounter for immunization: Secondary | ICD-10-CM | POA: Diagnosis not present

## 2016-02-21 DIAGNOSIS — I10 Essential (primary) hypertension: Secondary | ICD-10-CM | POA: Diagnosis not present

## 2016-02-21 DIAGNOSIS — Z6841 Body Mass Index (BMI) 40.0 and over, adult: Secondary | ICD-10-CM

## 2016-02-21 DIAGNOSIS — F418 Other specified anxiety disorders: Secondary | ICD-10-CM

## 2016-02-21 DIAGNOSIS — R600 Localized edema: Secondary | ICD-10-CM

## 2016-02-21 NOTE — Progress Notes (Signed)
HPI:   [name] is a 50 y.o. female, who is here today to follow on last OV, 01/30/16.  I saw her last on 01/30/16, when she was concerned about elevated BP and edema. She was reporting exertional dyspnea, sleeping on 3 pillows because she felt more comfortable, (? Orthopnea), and tachycardia. She has been on Torsemide 20 mg bid, according to pt to treat HTN and LE edema. I recommended to change to Furosemide and a lower dosedecrease dose because Hx of hypoK+ and LE edema was mild. She is not longer on KCL supplementation  Last OV, Metoprolol Succinate was increased from 50 mg to 75 mg and Diovan was decreased from 320 mg to 1/2 tab.  She is reporting improved BP, 120's/70's; but now she is concerned about BP reading here today, it was mildly elevated, 144/88, lower when re-checked after a few minutes.  She is not longer having tachycardia and sleeping on a pillow now. She also has Hx of GERD She is not longer having exertional dyspnea. She tells me that she is glad all her symptoms resolved but acknowledges that she was not convinced about recommendations and changes on medications.  She was also referred to cardiologists, appt 03/06/16.   Concerns today: "retaining fluids" She is still concerned about LE edema and fluid retention. States that she has gained about 5 pounds since her last OV and she thinks it is due to fluid retention. C/O peri ankle edema, not notice in the morning when she first gets up but late afternoon. She denies pain or erythema. She has not noted foam in urine , gross hematuria, or decreased urine output.  She follows a low salt diet. She does not exercise regularly, she doe snot follow a healthful diet regularly. DM II, reporting BS's as "good", planing on resuming care with endocrinologists.  Lab Results  Component Value Date   CREATININE 0.61 02/06/2016   BUN 13 02/06/2016   NA 136 02/06/2016   K 4.1 02/06/2016   CL 102 02/06/2016   CO2  28 02/06/2016   BNP 23.   Hx of depression and anxiety, she is on Celexa 40 mg daily, which she has taken for about 11-12 years. She denies suicidal thoughts and feels like medication is helping.    Review of Systems  Constitutional: Positive for unexpected weight change. Negative for activity change, appetite change, diaphoresis, fatigue and fever.  HENT: Negative for mouth sores, nosebleeds and trouble swallowing.   Eyes: Negative for pain and visual disturbance.  Respiratory: Negative for cough, shortness of breath and wheezing.   Cardiovascular: Positive for leg swelling. Negative for chest pain and palpitations.  Gastrointestinal: Negative for abdominal pain, blood in stool, nausea and vomiting.       Negative for changes in bowel habits.  Genitourinary: Negative for decreased urine volume, difficulty urinating and hematuria.  Skin: Negative for pallor and rash.  Neurological: Negative for syncope, facial asymmetry, weakness and headaches.  Psychiatric/Behavioral: Negative for confusion and sleep disturbance. The patient is nervous/anxious.       Current Outpatient Prescriptions on File Prior to Visit  Medication Sig Dispense Refill  . albuterol (PROAIR HFA) 108 (90 Base) MCG/ACT inhaler Inhale 2 puffs into the lungs 4 (four) times daily. 18 g 3  . atorvastatin (LIPITOR) 10 MG tablet Take 1 tablet (10 mg total) by mouth daily. Need annual exam for additional refills 90 tablet 0  . BAYER BREEZE 2 TEST DISK CHECK BLOOD SUGAR UP TO  6 TIMES DAILY 200 each 3  . BAYER MICROLET LANCETS lancets by Other route. Use as instructed    . canagliflozin (INVOKANA) 300 MG TABS tablet Take 1 tablet (300 mg total) by mouth daily. 90 tablet 3  . citalopram (CELEXA) 40 MG tablet 1 tab daily 90 tablet 2  . clobetasol cream (TEMOVATE) 0.05 % Apply 1 application topically 2 (two) times daily. Up to 2 weeks at the time 45 g 1  . esomeprazole (NEXIUM) 20 MG packet Take 20 mg by mouth daily before  breakfast. 90 each 0  . Famotidine (PEPCID AC PO) Take 1 tablet by mouth every 6 (six) hours as needed. Reported on 05/03/2015    . fluticasone (FLOVENT HFA) 44 MCG/ACT inhaler Inhale 1 puff into the lungs 2 (two) times daily. Need annual exam for additional refills 31.8 Inhaler 1  . furosemide (LASIX) 20 MG tablet Take 1 tablet (20 mg total) by mouth daily as needed for edema. 45 tablet 3  . Glucose Blood (BAYER BREEZE 2 TEST VI) by In Vitro route.    . hydroquinone 4 % cream APPLY TO AFFECTED AREA TWICE A DAY 28.35 g 2  . insulin aspart (NOVOLOG FLEXPEN) 100 UNIT/ML FlexPen INJECT 25 UNITS INTO THE SKIN 3 (THREE) TIMES DAILY 15 min before MEALS. 90 mL 1  . Insulin Glargine (TOUJEO SOLOSTAR) 300 UNIT/ML SOPN Inject 75 Units into the skin daily. 9 pen 3  . Insulin Pen Needle 31G X 5 MM MISC Use 4x a day 400 each 3  . metFORMIN (GLUCOPHAGE-XR) 500 MG 24 hr tablet Take 2 tablets (1,000 mg total) by mouth 2 (two) times daily. 360 tablet 3  . metoprolol succinate (TOPROL-XL) 50 MG 24 hr tablet Take 2 tablets (100 mg total) by mouth daily. 30 tablet 1  . Multiple Vitamins-Minerals (MULTIVITAMIN WITH MINERALS) tablet Take 1 tablet by mouth daily.    . valsartan (DIOVAN) 320 MG tablet TAKE 1/2 TABLET (320 MG TOTAL) BY MOUTH DAILY. 90 tablet 2   No current facility-administered medications on file prior to visit.      Past Medical History:  Diagnosis Date  . Allergic rhinitis   . Anemia   . Anxiety   . Asthma, mild intermittent 12/19/2015  . Depression   . Diabetes mellitus type 2 in obese (HCC)   . Hyperlipidemia   . Hypertension   . Morbid obesity (HCC)    Allergies  Allergen Reactions  . Ramipril Shortness Of Breath    Social History   Social History  . Marital status: Married    Spouse name: N/A  . Number of children: N/A  . Years of education: N/A   Social History Main Topics  . Smoking status: Never Smoker  . Smokeless tobacco: Never Used     Comment: Married  . Alcohol use  No  . Drug use: No  . Sexual activity: Not Currently   Other Topics Concern  . None   Social History Narrative  . None    Vitals:   02/21/16 0845  BP: 110/65  Pulse: 96  Resp: 12    O2 sat 98% at RA.  Body mass index is 49.31 kg/m.  Wt Readings from Last 3 Encounters:  02/21/16 287 lb 4 oz (130.3 kg)  01/30/16 286 lb 8 oz (130 kg)  12/19/15 287 lb 4 oz (130.3 kg)     Physical Exam  Nursing note and vitals reviewed. Constitutional: She is oriented to person, place, and time. She appears well-developed.  She does not appear ill. No distress.  HENT:  Head: Atraumatic.  Mouth/Throat: Oropharynx is clear and moist and mucous membranes are normal.  Eyes: Conjunctivae are normal.  Cardiovascular: Normal rate and regular rhythm.   No murmur heard. Pulses:      Dorsalis pedis pulses are 2+ on the right side, and 2+ on the left side.  Respiratory: Effort normal and breath sounds normal. No respiratory distress.  GI: Soft. She exhibits no mass. There is no hepatomegaly. There is no tenderness.  Musculoskeletal: She exhibits edema (Trace pitting edema bilateral LE). She exhibits no tenderness.  Neurological: She is alert and oriented to person, place, and time. She has normal strength. Coordination and gait normal.  Skin: Skin is warm. No erythema.  Psychiatric: Her mood appears anxious.  Well groomed, good eye contact.      ASSESSMENT AND PLAN:   Heather Myers was seen today for follow-up.  Diagnoses and all orders for this visit:    Bilateral lower extremity edema  We discussed possible causes. I do not think it is suggestive of a serious illness at this time, I think it is venous. I do not think diuretic needs to be increased, some side effects discussed. Renal function and BNP in normal range. Compression stockings ans wt loss may help.   Essential hypertension  Adequately controlled. No changes in current management. DASH-low salt diet recommended. Eye exam  recommended annually. F/U in 4 months, before if needed.  Other specified anxiety disorders  She is very anxious, frequently she questions recommendations and argumentative during visits. She needs a lot of reassurance during OV. She really feels like Celexa is helping. No changes in current management.   BMI 45.0-49.9, adult (HCC)  We discussed benefits of wt loss as well as adverse effects of obesity. Consistency with healthy diet and physical activity recommended. According to my scale her wt is stable.   Need for immunization against influenza -     Flu Vaccine QUAD 36+ mos IM    -She will continue following with endocrinologists for DM II. -Keep appt with cardiologists.  -Appt with GI for colonoscopy is pending.       -[name] was advised to return sooner than planned today if new concerns arise.       Brennen Camper G. [place], MD  [facility]. [place] office.

## 2016-02-21 NOTE — Patient Instructions (Addendum)
A few things to remember from today's visit:   Essential hypertension  Bilateral lower extremity edema  Other specified anxiety disorders   No changes today. swelling of legs seems to be venous and concerning at this time.  Vein disease is a condition that can affect the veins in the legs. It can cause leg pain, varicose veins, swollen legs, or open sores. Varicose veins are swollen and twisted veins.  Things that may help: leg exercises (ankle flexion, walking),compression stocking. Compression stockings- [facility] in [place]  Blood pressure goal < 140/90.    Keep appt with cardiologists.    For consultation about colonoscopy: [facility]  Address: [address] [place], [place], [place] [address]  [phone]

## 2016-02-21 NOTE — Progress Notes (Signed)
Pre visit review using our clinic review tool, if applicable. No additional management support is needed unless otherwise documented below in the visit note. 

## 2016-02-26 ENCOUNTER — Other Ambulatory Visit: Payer: Self-pay

## 2016-02-26 DIAGNOSIS — E1165 Type 2 diabetes mellitus with hyperglycemia: Secondary | ICD-10-CM

## 2016-02-26 DIAGNOSIS — R000 Tachycardia, unspecified: Secondary | ICD-10-CM

## 2016-02-26 DIAGNOSIS — Z794 Long term (current) use of insulin: Principal | ICD-10-CM

## 2016-02-26 DIAGNOSIS — I10 Essential (primary) hypertension: Secondary | ICD-10-CM

## 2016-02-26 DIAGNOSIS — IMO0002 Reserved for concepts with insufficient information to code with codable children: Secondary | ICD-10-CM

## 2016-02-26 MED ORDER — INSULIN GLARGINE 300 UNIT/ML ~~LOC~~ SOPN: 75.0000 [IU] | PEN_INJECTOR | Freq: Every day | SUBCUTANEOUS | 1 refills | Status: DC

## 2016-02-26 MED ORDER — METOPROLOL SUCCINATE ER 50 MG PO TB24: 75.0000 mg | ORAL_TABLET | Freq: Every day | ORAL | 1 refills | Status: DC

## 2016-02-27 ENCOUNTER — Encounter: Payer: Self-pay | Admitting: Interventional Cardiology

## 2016-03-05 DIAGNOSIS — E119 Type 2 diabetes mellitus without complications: Secondary | ICD-10-CM | POA: Diagnosis not present

## 2016-03-05 DIAGNOSIS — H5213 Myopia, bilateral: Secondary | ICD-10-CM | POA: Diagnosis not present

## 2016-03-05 LAB — HM DIABETES EYE EXAM

## 2016-03-07 ENCOUNTER — Other Ambulatory Visit: Payer: Self-pay | Admitting: Family Medicine

## 2016-03-07 DIAGNOSIS — I10 Essential (primary) hypertension: Secondary | ICD-10-CM

## 2016-03-07 DIAGNOSIS — Z760 Encounter for issue of repeat prescription: Secondary | ICD-10-CM

## 2016-03-13 ENCOUNTER — Ambulatory Visit: Payer: BLUE CROSS/BLUE SHIELD | Admitting: Women's Health

## 2016-03-13 ENCOUNTER — Ambulatory Visit (INDEPENDENT_AMBULATORY_CARE_PROVIDER_SITE_OTHER): Payer: BLUE CROSS/BLUE SHIELD | Admitting: Interventional Cardiology

## 2016-03-13 ENCOUNTER — Encounter: Payer: Self-pay | Admitting: Interventional Cardiology

## 2016-03-13 VITALS — BP 136/82 | HR 85 | Ht 65.0 in | Wt 286.4 lb

## 2016-03-13 DIAGNOSIS — E1165 Type 2 diabetes mellitus with hyperglycemia: Secondary | ICD-10-CM | POA: Diagnosis not present

## 2016-03-13 DIAGNOSIS — R0600 Dyspnea, unspecified: Secondary | ICD-10-CM

## 2016-03-13 DIAGNOSIS — Z794 Long term (current) use of insulin: Secondary | ICD-10-CM

## 2016-03-13 DIAGNOSIS — R609 Edema, unspecified: Secondary | ICD-10-CM | POA: Diagnosis not present

## 2016-03-13 DIAGNOSIS — I10 Essential (primary) hypertension: Secondary | ICD-10-CM | POA: Diagnosis not present

## 2016-03-13 DIAGNOSIS — IMO0002 Reserved for concepts with insufficient information to code with codable children: Secondary | ICD-10-CM

## 2016-03-13 MED ORDER — METOPROLOL SUCCINATE ER 50 MG PO TB24: ORAL_TABLET | ORAL | 3 refills | Status: DC

## 2016-03-13 MED ORDER — METOPROLOL SUCCINATE ER 25 MG PO TB24: ORAL_TABLET | ORAL | 3 refills | Status: DC

## 2016-03-13 MED ORDER — FUROSEMIDE 20 MG PO TABS: 20.0000 mg | ORAL_TABLET | Freq: Two times a day (BID) | ORAL | 3 refills | Status: DC

## 2016-03-13 NOTE — Patient Instructions (Addendum)
Medication Instructions:  Your physician has recommended you make the following change in your medication:  Start furosemide 20 mg by mouth twice daily.    Labwork: Your physician recommends that you return for lab work in: 7-10 days--BMP, BNP   Testing/Procedures: Your physician has requested that you have an echocardiogram. Echocardiography is a painless test that uses sound waves to create images of your heart. It provides your doctor with information about the size and shape of your heart and how well your heart's chambers and valves are working. This procedure takes approximately one hour. There are no restrictions for this procedure.    Follow-Up: Your physician recommends that you schedule a follow-up appointment in: about 4 weeks.  Scheduled for December 18,2017 at 10:00.     Any Other Special Instructions Will Be Listed Below (If Applicable).     If you need a refill on your cardiac medications before your next appointment, please call your pharmacy.

## 2016-03-13 NOTE — Progress Notes (Signed)
Cardiology Office Note    Date:  03/13/2016   ID:  Heather Myers, DOB August 07, 1965, MRN [id number]  PCP:  Betty Swaziland, MD  Cardiologist: Lesleigh Noe, MD   Chief Complaint  Patient presents with  . Shortness of Breath    History of Present Illness:  Heather Myers is a 50 y.o. female with history of hypertension, recent fluid retention, dyspnea on exertion, diabetes, morbid obesity, family history of kidney failure, and prior diagnosis of mild obstructive sleep apnea not requiring C Pap (50 year old information)..  Patient was doing well. She switched primary care physicians. Her medications were adjusted. After the adjustment which included discontinuing torsemide 20 mg per day and starting furosemide 40 mg per day she began noticing lower extremity swelling. This then led to the concern of congestive heart failure. She has had no chest discomfort. She denies orthopnea and PND. After furosemide was started blood pressure and fluid levels returned to near normal. She is here now to be evaluated to rule out the possibility of CHF.    Past Medical History:  Diagnosis Date  . Allergic rhinitis   . Anemia   . Anxiety   . Asthma, mild intermittent 12/19/2015  . Depression   . Diabetes mellitus type 2 in obese (HCC)   . Hyperlipidemia   . Hypertension   . Morbid obesity (HCC)     Past Surgical History:  Procedure Laterality Date  . ABDOMINAL SURGERY     chronic abd wall abscess  . CESAREAN SECTION     x's 2  . CHOLECYSTECTOMY    . LIPOMA EXCISION  08/03/2011   Procedure: EXCISION LIPOMA;  Surgeon: Shelly Rubenstein, MD;  Location: [facility];  Service: General;  Laterality: Right;  wide excision chronic abscess Right lower abdominal wall  . OOPHORECTOMY     R ovary removed at [facility]      Current Medications: Outpatient Medications Prior to Visit  Medication Sig Dispense Refill  . albuterol (PROAIR HFA) 108 (90 Base) MCG/ACT inhaler Inhale 2  puffs into the lungs 4 (four) times daily. 18 g 3  . atorvastatin (LIPITOR) 10 MG tablet Take 1 tablet (10 mg total) by mouth daily. Need annual exam for additional refills 90 tablet 0  . BAYER BREEZE 2 TEST DISK CHECK BLOOD SUGAR UP TO 6 TIMES DAILY 200 each 3  . BAYER MICROLET LANCETS lancets by Other route. Use as instructed    . canagliflozin (INVOKANA) 300 MG TABS tablet Take 1 tablet (300 mg total) by mouth daily. 90 tablet 3  . clobetasol cream (TEMOVATE) 0.05 % Apply 1 application topically 2 (two) times daily. Up to 2 weeks at the time 45 g 1  . furosemide (LASIX) 20 MG tablet Take 1 tablet (20 mg total) by mouth daily as needed for edema. 45 tablet 3  . Glucose Blood (BAYER BREEZE 2 TEST VI) by In Vitro route.    . hydroquinone 4 % cream APPLY TO AFFECTED AREA TWICE A DAY 28.35 g 2  . insulin aspart (NOVOLOG FLEXPEN) 100 UNIT/ML FlexPen INJECT 25 UNITS INTO THE SKIN 3 (THREE) TIMES DAILY 15 min before MEALS. 90 mL 1  . Insulin Glargine (TOUJEO SOLOSTAR) 300 UNIT/ML SOPN Inject 75 Units into the skin daily. 21 pen 1  . Insulin Pen Needle 31G X 5 MM MISC Use 4x a day 400 each 3  . metFORMIN (GLUCOPHAGE-XR) 500 MG 24 hr tablet Take 2 tablets (1,000 mg  total) by mouth 2 (two) times daily. 360 tablet 3  . Multiple Vitamins-Minerals (MULTIVITAMIN WITH MINERALS) tablet Take 1 tablet by mouth daily.    . citalopram (CELEXA) 40 MG tablet 1 tab daily (Patient not taking: Reported on 03/13/2016) 90 tablet 2  . esomeprazole (NEXIUM) 20 MG packet Take 20 mg by mouth daily before breakfast. (Patient not taking: Reported on 03/13/2016) 90 each 0  . Famotidine (PEPCID AC PO) Take 1 tablet by mouth every 6 (six) hours as needed. Reported on 05/03/2015    . fluticasone (FLOVENT HFA) 44 MCG/ACT inhaler Inhale 1 puff into the lungs 2 (two) times daily. Need annual exam for additional refills (Patient not taking: Reported on 03/13/2016) 31.8 Inhaler 1  . metoprolol succinate (TOPROL-XL) 50 MG 24 hr tablet Take  2 tablets (100 mg total) by mouth daily. (Patient not taking: Reported on 03/13/2016) 180 tablet 1  . valsartan (DIOVAN) 320 MG tablet TAKE 1/2 TABLET (320 MG TOTAL) BY MOUTH DAILY. (Patient not taking: Reported on 03/13/2016) 90 tablet 2   No facility-administered medications prior to visit.      Allergies:   Ramipril   Social History   Social History  . Marital status: Married    Spouse name: N/A  . Number of children: N/A  . Years of education: N/A   Social History Main Topics  . Smoking status: Never Smoker  . Smokeless tobacco: Never Used     Comment: Married  . Alcohol use No  . Drug use: No  . Sexual activity: Not Currently   Other Topics Concern  . None   Social History Narrative  . None     Family History:  The patient's family history includes Cancer in her father; Diabetes in her mother and other; Hypertension in her father and mother; Other in her mother.   ROS:   Please see the history of present illness.    Poor diet. Snores at night. Stops breathing while sleeping.  All other systems reviewed and are negative.   PHYSICAL EXAM:   VS:  BP 136/82 (BP Location: Right Arm)   Pulse 85   Ht 5\' 5"  (1.651 m)   Wt 286 lb 6.4 oz (129.9 kg)   BMI 47.66 kg/m    GEN: Well nourished, well developed, in no acute distress. Morbidly obese.  HEENT: normal  Neck: no JVD, carotid bruits, or masses Cardiac: RRR; no murmurs, rubs, or gallops. Trace ankle and shin edema. Respiratory:  clear to auscultation bilaterally, normal work of breathing GI: soft, nontender, nondistended, + BS MS: no deformity or atrophy  Skin: warm and dry, no rash Neuro:  Alert and Oriented x 3, Strength and sensation are intact Psych: euthymic mood, full affect  Wt Readings from Last 3 Encounters:  03/13/16 286 lb 6.4 oz (129.9 kg)  02/21/16 287 lb 4 oz (130.3 kg)  01/30/16 286 lb 8 oz (130 kg)      Studies/Labs Reviewed:   EKG:  EKG  Not repeated. Performed on 01/30/16 revealed sinus  rhythm, early repolarization, but no acute abnormality.  Recent Labs: 05/14/2015: ALT 12 12/19/2015: Hemoglobin 10.9; Platelets 572.0 02/06/2016: BUN 13; Creatinine, Ser 0.61; Potassium 4.1; Pro B Natriuretic peptide (BNP) 23.0; Sodium 136   Lipid Panel    Component Value Date/Time   CHOL 196 11/16/2013 1416   TRIG 172.0 (H) 11/16/2013 1416   HDL 35.80 (L) 11/16/2013 1416   CHOLHDL 5 11/16/2013 1416   VLDL 34.4 11/16/2013 1416   LDLCALC 126 (H)  11/16/2013 1416    Additional studies/ records that were reviewed today include:  Chest CT for PE rule out 10/31/12:  IMPRESSION:  1.  Poor to moderate quality evaluation for pulmonary embolism secondary to patient body habitus and motion artifact.  No large embolism identified. 2.  No other explanation for chest pain/shortness of breath. 3. Pulmonary artery enlargement suggests pulmonary arterial hypertension. 4.  Cardiomegaly.   ASSESSMENT:    1. Dyspnea, unspecified type   2. Essential hypertension   3. Type 2 diabetes mellitus with hyperglycemia, unspecified long term insulin use status (HCC)   4. Insulin dependent type 2 diabetes mellitus, uncontrolled (HCC)   5. Peripheral edema      PLAN:  In order of problems listed above:  1. The presence of lower extremity swelling and dyspnea basic question of pulmonary hypertension in this 50 year old female. She has a body habitus it was suggest pulmonary hypertension from sleep apnea. She could have diastolic left heart failure leading to pulmonary hypertension. We will perform a 2-D Doppler echocardiogram to assess left and right heart size and function and hopefully get some sense of what her pulmonary artery pressure illness. In the meantime I think we need increasing intensity of diuretic to furosemide 40 mg daily. She prefers to take this as 20 mg twice per day dosing. If there is evidence of pulmonary hypertension, she will need to have a repeat sleep study. I will plan to see her  back in one month. A sig metabolic panel and BNP will be performed in 7-10 days. 2. Blood pressure is borderline control for diabetic today measuring 136/82. Intensification of antihypertensive regimen is reasonable. We will do this by increasing the diuretic intensity. It might also be reasonable to increase the Diovan back to 320 mg dose that she was previously taking.    Medication Adjustments/Labs and Tests Ordered: Current medicines are reviewed at length with the patient today.  Concerns regarding medicines are outlined above.  Medication changes, Labs and Tests ordered today are listed in the Patient Instructions below. There are no Patient Instructions on file for this visit.   Signed, Lesleigh Noe, MD  03/13/2016 4:20 PM    [facility] [address] [place], [place], [place]  [address] Phone: [phone]; Fax: [phone]

## 2016-03-23 ENCOUNTER — Other Ambulatory Visit: Payer: BLUE CROSS/BLUE SHIELD

## 2016-03-24 ENCOUNTER — Ambulatory Visit: Payer: BLUE CROSS/BLUE SHIELD | Admitting: Women's Health

## 2016-03-25 ENCOUNTER — Ambulatory Visit: Payer: BLUE CROSS/BLUE SHIELD | Admitting: Family Medicine

## 2016-04-01 ENCOUNTER — Encounter: Payer: Self-pay | Admitting: Women's Health

## 2016-04-01 ENCOUNTER — Other Ambulatory Visit: Payer: Self-pay | Admitting: Women's Health

## 2016-04-01 ENCOUNTER — Ambulatory Visit (INDEPENDENT_AMBULATORY_CARE_PROVIDER_SITE_OTHER): Payer: BLUE CROSS/BLUE SHIELD | Admitting: Women's Health

## 2016-04-01 VITALS — BP 134/80 | Ht 64.0 in | Wt 286.0 lb

## 2016-04-01 DIAGNOSIS — B373 Candidiasis of vulva and vagina: Secondary | ICD-10-CM

## 2016-04-01 DIAGNOSIS — R350 Frequency of micturition: Secondary | ICD-10-CM | POA: Diagnosis not present

## 2016-04-01 DIAGNOSIS — N920 Excessive and frequent menstruation with regular cycle: Secondary | ICD-10-CM

## 2016-04-01 DIAGNOSIS — B3731 Acute candidiasis of vulva and vagina: Secondary | ICD-10-CM

## 2016-04-01 LAB — URINALYSIS W MICROSCOPIC + REFLEX CULTURE
BILIRUBIN URINE: NEGATIVE
Bacteria, UA: NONE SEEN [HPF]
Casts: NONE SEEN [LPF]
Crystals: NONE SEEN [HPF]
HGB URINE DIPSTICK: NEGATIVE
LEUKOCYTES UA: NEGATIVE
NITRITE: NEGATIVE
PH: 6 (ref 5.0–8.0)
Protein, ur: NEGATIVE
RBC / HPF: NONE SEEN RBC/HPF (ref <=2)
Specific Gravity, Urine: 1.02 (ref 1.001–1.035)
WBC UA: NONE SEEN WBC/HPF (ref <=5)
YEAST: NONE SEEN [HPF]

## 2016-04-01 LAB — WET PREP FOR TRICH, YEAST, CLUE
CLUE CELLS WET PREP: NONE SEEN
Trich, Wet Prep: NONE SEEN
WBC, Wet Prep HPF POC: NONE SEEN
Yeast Wet Prep HPF POC: NONE SEEN

## 2016-04-01 LAB — TSH: TSH: 1.51 m[IU]/L

## 2016-04-01 MED ORDER — TERCONAZOLE 0.4 % VA CREA: 1.0000 | TOPICAL_CREAM | Freq: Every day | VAGINAL | 0 refills | Status: DC

## 2016-04-01 MED ORDER — IBUPROFEN 600 MG PO TABS: 600.0000 mg | ORAL_TABLET | Freq: Three times a day (TID) | ORAL | 1 refills | Status: DC | PRN

## 2016-04-01 NOTE — Progress Notes (Signed)
Presents with lower abdominal pelvic pain and burning with urination.  Cramping abdominal pain began 5 days ago, states pain 10/10 at times, decreasing to 8.5/10 with Ibuprofen.  When pain began,  had no bowel movement for 2 days, usual is 2-3 bowel movements per day, had some relief by taking Linzess and Fleets suppository.  Reports burning with urination starting 3 days ago.  Denies urinary frequency, urgency, vaginal discharge, burning, odor.  Reports periods every 2 weeks, lasting for 2 weeks with heavy bleeding and large clots and changing pads/tampons every hour and bleeds through clothing approximately 2 times per cycle for the past 9 months to 1 year. 03/2013 Mirena IUD, requesting removal.  States has missed work due to discomfort. Not sexually active. Type 2 diabetes on insulin, hypertension, asthma, depression, HSV history with rare outbreaks. History of iron deficiency anemia  Exam:  Appears uncomfortable. Obese. No CVAT. Generalized lower abdominal pain on palpation.  External genitalia erythematous. Speculum exam, no bleeding, vaginal discharge, mucosa within normal limits.  Mirena IUD strings not visualized.  Unable to locate strings with curved kelley.  Wet prep negative.  UA negative.   Monthly 2 weeks cycle-Menorrhagia with Mirena IUD Pelvic pain Constipation Burning with urination Morbid obesity History of fibroid uterus (small)  Plan: Urine culture pending, TSH, schedule sonohysterogram after next cycle with Dr. Audie Box per request to have the IUD removed at assess menorrhagia. Terazol vaginal cream 3 day externally for vaginal irritation. Continue over-the-counter laxatives and lens S until stools are soft.

## 2016-04-01 NOTE — Patient Instructions (Signed)
Dysfunctional Uterine Bleeding Introduction Dysfunctional uterine bleeding is abnormal bleeding from the uterus. Dysfunctional uterine bleeding includes:  A period that comes earlier or later than usual.  A period that is lighter, heavier, or has blood clots.  Bleeding between periods.  Skipping one or more periods.  Bleeding after sexual intercourse.  Bleeding after menopause. Follow these instructions at home: Pay attention to any changes in your symptoms. Follow these instructions to help with your condition: Eating and drinking  Eat well-balanced meals. Include foods that are high in iron, such as liver, meat, shellfish, green leafy vegetables, and eggs.  If you become constipated:  Drink plenty of water.  Eat fruits and vegetables that are high in water and fiber, such as spinach, carrots, raspberries, apples, and mango. Medicines  Take over-the-counter and prescription medicines only as told by your health care provider.  Do not change medicines without talking with your health care provider.  Aspirin or medicines that contain aspirin may make the bleeding worse. Do not take those medicines:  During the week before your period.  During your period.  If you were prescribed iron pills, take them as told by your health care provider. Iron pills help to replace iron that your body loses because of this condition. Activity  If you need to change your sanitary pad or tampon more than one time every 2 hours:  Lie in bed with your feet raised (elevated).  Place a cold pack on your lower abdomen.  Rest as much as possible until the bleeding stops or slows down.  Do not try to lose weight until the bleeding has stopped and your blood iron level is back to normal. Other Instructions  For two months, write down:  When your period starts.  When your period ends.  When any abnormal bleeding occurs.  What problems you notice.  Keep all follow up visits as told by  your health care provider. This is important. Contact a health care provider if:  You get light-headed or weak.  You have nausea and vomiting.  You cannot eat or drink without vomiting.  You feel dizzy or have diarrhea while you are taking medicines.  You are taking birth control pills or hormones, and you want to change them or stop taking them. Get help right away if:  You develop a fever or chills.  You need to change your sanitary pad or tampon more than one time per hour.  Your bleeding becomes heavier, or your flow contains clots more often.  You develop pain in your abdomen.  You lose consciousness.  You develop a rash. This information is not intended to replace advice given to you by your health care provider. Make sure you discuss any questions you have with your health care provider. Document Released: 04/10/2000 Document Revised: 09/19/2015 Document Reviewed: 07/09/2014  2017 Elsevier  

## 2016-04-02 ENCOUNTER — Encounter: Payer: Self-pay | Admitting: Women's Health

## 2016-04-03 LAB — URINE CULTURE

## 2016-04-06 ENCOUNTER — Ambulatory Visit: Payer: BLUE CROSS/BLUE SHIELD | Admitting: Internal Medicine

## 2016-04-08 ENCOUNTER — Other Ambulatory Visit (HOSPITAL_COMMUNITY): Payer: BLUE CROSS/BLUE SHIELD

## 2016-04-10 ENCOUNTER — Encounter: Payer: Self-pay | Admitting: Family Medicine

## 2016-04-13 ENCOUNTER — Ambulatory Visit: Payer: BLUE CROSS/BLUE SHIELD | Admitting: Interventional Cardiology

## 2016-04-13 ENCOUNTER — Other Ambulatory Visit: Payer: Self-pay | Admitting: Adult Health

## 2016-04-13 DIAGNOSIS — Z760 Encounter for issue of repeat prescription: Secondary | ICD-10-CM

## 2016-04-14 NOTE — Telephone Encounter (Signed)
Ok to refill 

## 2016-04-15 ENCOUNTER — Encounter: Payer: Self-pay | Admitting: Interventional Cardiology

## 2016-05-15 ENCOUNTER — Encounter: Payer: Self-pay | Admitting: Family Medicine

## 2016-05-15 ENCOUNTER — Ambulatory Visit (INDEPENDENT_AMBULATORY_CARE_PROVIDER_SITE_OTHER): Payer: BLUE CROSS/BLUE SHIELD | Admitting: Family Medicine

## 2016-05-15 VITALS — BP 118/70 | HR 89 | Temp 98.0°F | Ht 64.0 in | Wt 295.6 lb

## 2016-05-15 DIAGNOSIS — J0100 Acute maxillary sinusitis, unspecified: Secondary | ICD-10-CM | POA: Diagnosis not present

## 2016-05-15 DIAGNOSIS — J4520 Mild intermittent asthma, uncomplicated: Secondary | ICD-10-CM

## 2016-05-15 DIAGNOSIS — R042 Hemoptysis: Secondary | ICD-10-CM | POA: Diagnosis not present

## 2016-05-15 MED ORDER — DOXYCYCLINE HYCLATE 100 MG PO CAPS: 100.0000 mg | ORAL_CAPSULE | Freq: Two times a day (BID) | ORAL | 0 refills | Status: DC

## 2016-05-15 MED ORDER — BENZONATATE 100 MG PO CAPS: 100.0000 mg | ORAL_CAPSULE | Freq: Two times a day (BID) | ORAL | 0 refills | Status: DC | PRN

## 2016-05-15 NOTE — Progress Notes (Signed)
Pre visit review using our clinic review tool, if applicable. No additional management support is needed unless otherwise documented below in the visit note. 

## 2016-05-15 NOTE — Patient Instructions (Signed)
BEFORE YOU LEAVE: -xray sheet  Go get the chest xray.  Take the antibiotic as instructed.  [place] the cough medication as needed.  Follow up if worsening, new concerns or symptoms persist despite treatment.  I hope you are feeling better soon!

## 2016-05-15 NOTE — Progress Notes (Signed)
HPI:  URI: -started: 9 days ago and worsening -symptoms:nasal congestion, sore throat, cough, green and blood nasal congestion, sinus pain, hemoptysis on several occassions, initially had fever but that resolved -denies:SOB, NVD, tooth pain -has tried: over the counter options, used her albuterol a few times initially but she does not feel this is asthma or a lung issue -sick contacts/travel/risks: no reported flu, strep or tick exposure  ROS: See pertinent positives and negatives per HPI.  Past Medical History:  Diagnosis Date  . Allergic rhinitis   . Anemia   . Anxiety   . Asthma, mild intermittent 12/19/2015  . Depression   . Diabetes mellitus type 2 in obese (HCC)   . Hyperlipidemia   . Hypertension   . Morbid obesity (HCC)     Past Surgical History:  Procedure Laterality Date  . ABDOMINAL SURGERY     chronic abd wall abscess  . CESAREAN SECTION     x's 2  . CHOLECYSTECTOMY    . LIPOMA EXCISION  08/03/2011   Procedure: EXCISION LIPOMA;  Surgeon: Shelly Rubenstein, MD;  Location: [facility];  Service: General;  Laterality: Right;  wide excision chronic abscess Right lower abdominal wall  . OOPHORECTOMY     R ovary removed at [facility]  . RSO      Family History  Problem Relation Age of Onset  . Diabetes Mother   . Hypertension Mother   . Other Mother     renal failure  . Diabetes Other   . Hypertension Father   . Cancer Father     Social History   Social History  . Marital status: Married    Spouse name: N/A  . Number of children: N/A  . Years of education: N/A   Social History Main Topics  . Smoking status: Never Smoker  . Smokeless tobacco: Never Used     Comment: Married  . Alcohol use No  . Drug use: No  . Sexual activity: Not Currently   Other Topics Concern  . None   Social History Narrative  . None     Current Outpatient Prescriptions:  .  albuterol (PROAIR HFA) 108 (90 Base) MCG/ACT inhaler, Inhale 2 puffs into the  lungs 4 (four) times daily., Disp: 18 g, Rfl: 3 .  atorvastatin (LIPITOR) 10 MG tablet, Take 1 tablet (10 mg total) by mouth daily. Need annual exam for additional refills, Disp: 90 tablet, Rfl: 0 .  BAYER BREEZE 2 TEST DISK, CHECK BLOOD SUGAR UP TO 6 TIMES DAILY, Disp: 200 each, Rfl: 3 .  BAYER MICROLET LANCETS lancets, by Other route. Use as instructed, Disp: , Rfl:  .  canagliflozin (INVOKANA) 300 MG TABS tablet, Take 1 tablet (300 mg total) by mouth daily., Disp: 90 tablet, Rfl: 3 .  citalopram (CELEXA) 40 MG tablet, Take 40 mg by mouth daily., Disp: , Rfl:  .  clobetasol cream (TEMOVATE) 0.05 %, Apply 1 application topically 2 (two) times daily. Up to 2 weeks at the time, Disp: 45 g, Rfl: 1 .  esomeprazole (NEXIUM) 20 MG capsule, Take 20 mg by mouth daily as needed (heartburn)., Disp: , Rfl:  .  Famotidine (PEPCID AC PO), Take 1 tablet by mouth every 6 (six) hours as needed (heartburn)., Disp: , Rfl:  .  fluticasone (FLONASE) 50 MCG/ACT nasal spray, PLACE 2 SPRAYS INTO BOTH NOSTRILS DAILY., Disp: 16 g, Rfl: 3 .  fluticasone (FLOVENT HFA) 44 MCG/ACT inhaler, Inhale 1 puff into the lungs 2 (two)  times daily as needed (shortness of breath)., Disp: , Rfl:  .  furosemide (LASIX) 20 MG tablet, Take 1 tablet (20 mg total) by mouth 2 (two) times daily., Disp: 180 tablet, Rfl: 3 .  Glucose Blood (BAYER BREEZE 2 TEST VI), by In Vitro route., Disp: , Rfl:  .  hydroquinone 4 % cream, APPLY TO AFFECTED AREA TWICE A DAY, Disp: 28.35 g, Rfl: 2 .  ibuprofen (ADVIL,MOTRIN) 600 MG tablet, Take 1 tablet (600 mg total) by mouth every 8 (eight) hours as needed., Disp: 60 tablet, Rfl: 1 .  insulin aspart (NOVOLOG FLEXPEN) 100 UNIT/ML FlexPen, INJECT 25 UNITS INTO THE SKIN 3 (THREE) TIMES DAILY 15 min before MEALS., Disp: 90 mL, Rfl: 1 .  Insulin Glargine (TOUJEO SOLOSTAR) 300 UNIT/ML SOPN, Inject 75 Units into the skin daily., Disp: 21 pen, Rfl: 1 .  Insulin Pen Needle 31G X 5 MM MISC, Use 4x a day, Disp: 400 each,  Rfl: 3 .  metFORMIN (GLUCOPHAGE-XR) 500 MG 24 hr tablet, Take 2 tablets (1,000 mg total) by mouth 2 (two) times daily., Disp: 360 tablet, Rfl: 3 .  metoprolol succinate (TOPROL XL) 25 MG 24 hr tablet, Take one tablet by mouth daily.( Take with 50 mg to total 75 mg daily.), Disp: 90 tablet, Rfl: 3 .  metoprolol succinate (TOPROL-XL) 50 MG 24 hr tablet, Take one tablet by mouth daily (Take with 25 mg tablet to total 75 mg daily), Disp: 90 tablet, Rfl: 3 .  Multiple Vitamins-Minerals (MULTIVITAMIN WITH MINERALS) tablet, Take 1 tablet by mouth daily., Disp: , Rfl:  .  terconazole (TERAZOL 7) 0.4 % vaginal cream, Place 1 applicator vaginally at bedtime., Disp: 45 g, Rfl: 0 .  valsartan (DIOVAN) 320 MG tablet, Take 160 mg by mouth daily., Disp: , Rfl: 1 .  benzonatate (TESSALON) 100 MG capsule, Take 1 capsule (100 mg total) by mouth 2 (two) times daily as needed for cough., Disp: 20 capsule, Rfl: 0 .  doxycycline (VIBRAMYCIN) 100 MG capsule, Take 1 capsule (100 mg total) by mouth 2 (two) times daily., Disp: 20 capsule, Rfl: 0  EXAM:  Vitals:   05/15/16 1443  BP: 118/70  Pulse: 89  Temp: 98 F (36.7 C)    Body mass index is 50.74 kg/m.  GENERAL: vitals reviewed and listed above, alert, oriented, appears well hydrated and in no acute distress  HEENT: atraumatic, conjunttiva clear, no obvious abnormalities on inspection of external nose and ears, normal appearance of ear canals and TMs, clear nasal congestion, mild post oropharyngeal erythema with PND, no tonsillar edema or exudate, no sinus TTP  NECK: no obvious masses on inspection  LUNGS: clear to auscultation bilaterally, no wheezes, rales or rhonchi, good air movement  CV: HRRR, no peripheral edema  MS: moves all extremities without noticeable abnormality  PSYCH: pleasant and cooperative, no obvious depression or anxiety  ASSESSMENT AND PLAN:  Discussed the following assessment and plan:  Acute non-recurrent maxillary sinusitis -  Plan: doxycycline (VIBRAMYCIN) 100 MG capsule, benzonatate (TESSALON) 100 MG capsule  Hemoptysis - Plan: DG Chest 2 View  Mild intermittent asthma without complication  We discussed potential etiologies, with resolving VURI or influenza and secondary sinusitis being most likely. We discussed treatment side effects, likely course, potential complications, transmission, and signs of developing a serious illness. Opted to treat with doxycycline. CXR given hemoptysis though likely from dry nasal membranes. Advised humidifier and nasal saline. He asthma seems ok, discussed prednisone may be needed if any asthma symptoms not responding  to inhaler. -of course, we advised to return or notify a doctor immediately if symptoms worsen or persist or new concerns arise.    Patient Instructions  BEFORE YOU LEAVE: -xray sheet  Go get the chest xray.  Take the antibiotic as instructed.  [place] the cough medication as needed.  Follow up if worsening, new concerns or symptoms persist despite treatment.  I hope you are feeling better soon!    Kriste Basque R., DO

## 2016-05-20 ENCOUNTER — Ambulatory Visit (INDEPENDENT_AMBULATORY_CARE_PROVIDER_SITE_OTHER)
Admission: RE | Admit: 2016-05-20 | Discharge: 2016-05-20 | Disposition: A | Discharge status: Home / Self Care | Payer: BLUE CROSS/BLUE SHIELD | Source: Ambulatory Visit | Attending: Family Medicine | Admitting: Family Medicine

## 2016-05-20 DIAGNOSIS — R042 Hemoptysis: Secondary | ICD-10-CM | POA: Diagnosis not present

## 2016-05-20 DIAGNOSIS — R05 Cough: Secondary | ICD-10-CM | POA: Diagnosis not present

## 2016-05-20 DIAGNOSIS — R0602 Shortness of breath: Secondary | ICD-10-CM | POA: Diagnosis not present

## 2016-05-28 ENCOUNTER — Ambulatory Visit: Payer: BLUE CROSS/BLUE SHIELD | Admitting: Family Medicine

## 2016-05-31 ENCOUNTER — Other Ambulatory Visit: Payer: Self-pay | Admitting: Women's Health

## 2016-06-01 ENCOUNTER — Ambulatory Visit: Payer: BLUE CROSS/BLUE SHIELD | Admitting: Family Medicine

## 2016-06-02 NOTE — Telephone Encounter (Signed)
Please call and review best not to take on a regular basis, hard on the kidneys,

## 2016-06-02 NOTE — Telephone Encounter (Signed)
Left a detailed message on pt voicemail 

## 2016-06-03 ENCOUNTER — Other Ambulatory Visit (HOSPITAL_COMMUNITY): Payer: BLUE CROSS/BLUE SHIELD

## 2016-06-04 ENCOUNTER — Ambulatory Visit (HOSPITAL_COMMUNITY): Payer: BLUE CROSS/BLUE SHIELD

## 2016-06-08 ENCOUNTER — Other Ambulatory Visit: Payer: Self-pay | Admitting: Family Medicine

## 2016-06-08 DIAGNOSIS — Z760 Encounter for issue of repeat prescription: Secondary | ICD-10-CM

## 2016-06-11 ENCOUNTER — Encounter: Payer: BLUE CROSS/BLUE SHIELD | Admitting: Obstetrics & Gynecology

## 2016-06-15 ENCOUNTER — Encounter: Payer: BLUE CROSS/BLUE SHIELD | Admitting: Obstetrics and Gynecology

## 2016-06-22 ENCOUNTER — Ambulatory Visit (HOSPITAL_COMMUNITY): Payer: BLUE CROSS/BLUE SHIELD | Attending: Cardiovascular Disease

## 2016-06-22 ENCOUNTER — Other Ambulatory Visit: Payer: Self-pay

## 2016-06-22 DIAGNOSIS — Z6841 Body Mass Index (BMI) 40.0 and over, adult: Secondary | ICD-10-CM | POA: Diagnosis not present

## 2016-06-22 DIAGNOSIS — G4733 Obstructive sleep apnea (adult) (pediatric): Secondary | ICD-10-CM | POA: Insufficient documentation

## 2016-06-22 DIAGNOSIS — E785 Hyperlipidemia, unspecified: Secondary | ICD-10-CM | POA: Diagnosis not present

## 2016-06-22 DIAGNOSIS — R0600 Dyspnea, unspecified: Secondary | ICD-10-CM | POA: Insufficient documentation

## 2016-06-22 DIAGNOSIS — R600 Localized edema: Secondary | ICD-10-CM | POA: Insufficient documentation

## 2016-06-22 DIAGNOSIS — I10 Essential (primary) hypertension: Secondary | ICD-10-CM | POA: Insufficient documentation

## 2016-06-22 DIAGNOSIS — E119 Type 2 diabetes mellitus without complications: Secondary | ICD-10-CM | POA: Diagnosis not present

## 2016-06-22 MED ORDER — PERFLUTREN LIPID MICROSPHERE
1.0000 mL | INTRAVENOUS | Status: AC | PRN
  Administered 2016-06-22: 2 mL via INTRAVENOUS

## 2016-06-23 ENCOUNTER — Telehealth: Payer: Self-pay | Admitting: *Deleted

## 2016-06-23 NOTE — Telephone Encounter (Signed)
-----   Message from Lyn Records, MD sent at 06/22/2016  8:05 PM EST ----- Let the patient know echo is normal. A copy will be sent to Betty Swaziland, MD

## 2016-06-23 NOTE — Telephone Encounter (Signed)
Reviewed echo results with pt.  Pt verbalized understanding.  Pt states since she was last seen by Dr. Katrinka Myers in Nov, she has gained 12lbs.  PCP d/c'ed Torsemide and started pt on Furosemide and then we increased it at OV.  Pt still retaining fluid with no improvement.  Pt states she had better results when she was on the Torsemide.  BP has been running slightly higher on the Furosemide then it did on Torsemide.  BP is now 120-124/85 and on Torsemide it was around 110/70.  Pt also seen PCP in January and had chest xray that showed borderline cardiomegaly.  Pt wanted to Dr. Katrinka Myers to be made aware of this.  Advised I would send message for review and advisement.

## 2016-06-23 NOTE — Telephone Encounter (Signed)
Please let her know this is confusing and only one MD should be changing therapy. If that person was most recently the PCP, she should re-discuss concerns with her.

## 2016-06-24 NOTE — Telephone Encounter (Signed)
Attempted to call pt.  VM was full.  Unable to leave message.  Will try again later.

## 2016-06-26 NOTE — Telephone Encounter (Signed)
Attempted to call pt again.  VM was full.  Unable to LVM.  Will try again later.

## 2016-06-30 NOTE — Telephone Encounter (Signed)
Left message to call back  

## 2016-07-02 ENCOUNTER — Other Ambulatory Visit: Payer: Self-pay | Admitting: Internal Medicine

## 2016-07-02 NOTE — Telephone Encounter (Signed)
No, she was lost for f/u 

## 2016-07-02 NOTE — Telephone Encounter (Signed)
Last seen 04/2015. Okay to refill?

## 2016-07-08 ENCOUNTER — Other Ambulatory Visit: Payer: Self-pay | Admitting: Gynecology

## 2016-07-08 DIAGNOSIS — N920 Excessive and frequent menstruation with regular cycle: Secondary | ICD-10-CM

## 2016-07-09 DIAGNOSIS — L700 Acne vulgaris: Secondary | ICD-10-CM | POA: Diagnosis not present

## 2016-07-09 DIAGNOSIS — L819 Disorder of pigmentation, unspecified: Secondary | ICD-10-CM | POA: Diagnosis not present

## 2016-07-09 DIAGNOSIS — L659 Nonscarring hair loss, unspecified: Secondary | ICD-10-CM | POA: Diagnosis not present

## 2016-07-09 DIAGNOSIS — L309 Dermatitis, unspecified: Secondary | ICD-10-CM | POA: Diagnosis not present

## 2016-07-14 ENCOUNTER — Other Ambulatory Visit: Payer: Self-pay | Admitting: Gynecology

## 2016-07-14 DIAGNOSIS — N939 Abnormal uterine and vaginal bleeding, unspecified: Secondary | ICD-10-CM

## 2016-07-14 DIAGNOSIS — T8332XS Displacement of intrauterine contraceptive device, sequela: Secondary | ICD-10-CM

## 2016-07-14 DIAGNOSIS — T8389XS Other specified complication of genitourinary prosthetic devices, implants and grafts, sequela: Secondary | ICD-10-CM

## 2016-07-15 ENCOUNTER — Ambulatory Visit: Payer: BLUE CROSS/BLUE SHIELD | Admitting: Gynecology

## 2016-07-15 ENCOUNTER — Other Ambulatory Visit: Payer: BLUE CROSS/BLUE SHIELD

## 2016-08-13 ENCOUNTER — Ambulatory Visit (INDEPENDENT_AMBULATORY_CARE_PROVIDER_SITE_OTHER): Payer: BLUE CROSS/BLUE SHIELD | Admitting: Family Medicine

## 2016-08-13 ENCOUNTER — Encounter: Payer: Self-pay | Admitting: Family Medicine

## 2016-08-13 ENCOUNTER — Ambulatory Visit: Payer: BLUE CROSS/BLUE SHIELD | Admitting: Family Medicine

## 2016-08-13 ENCOUNTER — Other Ambulatory Visit: Payer: Self-pay | Admitting: Internal Medicine

## 2016-08-13 VITALS — BP 112/74 | HR 93 | Temp 98.4°F | Ht 64.0 in | Wt 294.1 lb

## 2016-08-13 DIAGNOSIS — H66001 Acute suppurative otitis media without spontaneous rupture of ear drum, right ear: Secondary | ICD-10-CM

## 2016-08-13 DIAGNOSIS — J069 Acute upper respiratory infection, unspecified: Secondary | ICD-10-CM

## 2016-08-13 DIAGNOSIS — R300 Dysuria: Secondary | ICD-10-CM

## 2016-08-13 LAB — POCT URINALYSIS DIPSTICK
Ketones, UA: 5
Leukocytes, UA: NEGATIVE
NITRITE UA: NEGATIVE
PROTEIN UA: NEGATIVE
RBC UA: NEGATIVE
Spec Grav, UA: 1.01 (ref 1.010–1.025)
Urobilinogen, UA: 0.2 E.U./dL
pH, UA: 5 (ref 5.0–8.0)

## 2016-08-13 MED ORDER — AMOXICILLIN-POT CLAVULANATE 875-125 MG PO TABS: 1.0000 | ORAL_TABLET | Freq: Two times a day (BID) | ORAL | 0 refills | Status: DC

## 2016-08-13 NOTE — Progress Notes (Addendum)
HPI: Acute visit for several issues:  ? Sinus infection: -started: 1 week ago and worsening -symptoms:nasal congestion, sore throat, cough, dizziness a few times, had to use alb a few times initially for wheezing, thinks had fever the first few days -denies:body aches, NVD, sinus pain, SOB -sick contacts/travel/risks: no reported flu, strep or tick exposure -Hx of: allergies, asthma  Dysuria: -started 3 days ago -symptoms include burning with urination, frequency, urgency -no hematuria, vag symptoms, nvd, fevers, flank pain  ROS: See pertinent positives and negatives per HPI.  Past Medical History:  Diagnosis Date  . Allergic rhinitis   . Anemia   . Anxiety   . Asthma, mild intermittent 12/19/2015  . Depression   . Diabetes mellitus type 2 in obese (HCC)   . Hyperlipidemia   . Hypertension   . Morbid obesity (HCC)     Past Surgical History:  Procedure Laterality Date  . ABDOMINAL SURGERY     chronic abd wall abscess  . CESAREAN SECTION     x's 2  . CHOLECYSTECTOMY    . LIPOMA EXCISION  08/03/2011   Procedure: EXCISION LIPOMA;  Surgeon: Shelly Rubenstein, MD;  Location: [facility];  Service: General;  Laterality: Right;  wide excision chronic abscess Right lower abdominal wall  . OOPHORECTOMY     R ovary removed at [facility]  . RSO      Family History  Problem Relation Age of Onset  . Diabetes Mother   . Hypertension Mother   . Other Mother     renal failure  . Diabetes Other   . Hypertension Father   . Cancer Father     Social History   Social History  . Marital status: Married    Spouse name: N/A  . Number of children: N/A  . Years of education: N/A   Social History Main Topics  . Smoking status: Never Smoker  . Smokeless tobacco: Never Used     Comment: Married  . Alcohol use No  . Drug use: No  . Sexual activity: Not Currently   Other Topics Concern  . None   Social History Narrative  . None     Current Outpatient  Prescriptions:  .  albuterol (PROAIR HFA) 108 (90 Base) MCG/ACT inhaler, Inhale 2 puffs into the lungs 4 (four) times daily., Disp: 18 g, Rfl: 3 .  atorvastatin (LIPITOR) 10 MG tablet, Take 1 tablet (10 mg total) by mouth daily., Disp: 90 tablet, Rfl: 0 .  BAYER BREEZE 2 TEST DISK, CHECK BLOOD SUGAR UP TO 6 TIMES DAILY, Disp: 200 each, Rfl: 3 .  BAYER MICROLET LANCETS lancets, by Other route. Use as instructed, Disp: , Rfl:  .  canagliflozin (INVOKANA) 300 MG TABS tablet, Take 1 tablet (300 mg total) by mouth daily., Disp: 90 tablet, Rfl: 3 .  citalopram (CELEXA) 40 MG tablet, Take 40 mg by mouth daily., Disp: , Rfl:  .  clobetasol cream (TEMOVATE) 0.05 %, Apply 1 application topically 2 (two) times daily. Up to 2 weeks at the time, Disp: 45 g, Rfl: 1 .  Famotidine (PEPCID AC PO), Take 1 tablet by mouth every 6 (six) hours as needed (heartburn)., Disp: , Rfl:  .  fluticasone (FLONASE) 50 MCG/ACT nasal spray, PLACE 2 SPRAYS INTO BOTH NOSTRILS DAILY., Disp: 16 g, Rfl: 3 .  fluticasone (FLOVENT HFA) 44 MCG/ACT inhaler, Inhale 1 puff into the lungs 2 (two) times daily as needed (shortness of breath)., Disp: , Rfl:  .  Glucose Blood (BAYER BREEZE 2 TEST VI), by In Vitro route., Disp: , Rfl:  .  hydroquinone 4 % cream, APPLY TO AFFECTED AREA TWICE A DAY, Disp: 28.35 g, Rfl: 2 .  ibuprofen (ADVIL,MOTRIN) 600 MG tablet, TAKE 1 TABLET BY MOUTH EVERY 8 HOURS AS NEEDED, Disp: 60 tablet, Rfl: 0 .  insulin aspart (NOVOLOG FLEXPEN) 100 UNIT/ML FlexPen, INJECT 25 UNITS INTO THE SKIN 3 (THREE) TIMES DAILY 15 min before MEALS., Disp: 90 mL, Rfl: 1 .  Insulin Glargine (TOUJEO SOLOSTAR) 300 UNIT/ML SOPN, Inject 75 Units into the skin daily., Disp: 21 pen, Rfl: 1 .  Insulin Pen Needle 31G X 5 MM MISC, Use 4x a day, Disp: 400 each, Rfl: 3 .  metFORMIN (GLUCOPHAGE-XR) 500 MG 24 hr tablet, Take 2 tablets (1,000 mg total) by mouth 2 (two) times daily., Disp: 360 tablet, Rfl: 3 .  metoprolol succinate (TOPROL XL) 25 MG 24  hr tablet, Take one tablet by mouth daily.( Take with 50 mg to total 75 mg daily.), Disp: 90 tablet, Rfl: 3 .  metoprolol succinate (TOPROL-XL) 50 MG 24 hr tablet, Take one tablet by mouth daily (Take with 25 mg tablet to total 75 mg daily), Disp: 90 tablet, Rfl: 3 .  Multiple Vitamins-Minerals (MULTIVITAMIN WITH MINERALS) tablet, Take 1 tablet by mouth daily., Disp: , Rfl:  .  NOVOLOG FLEXPEN 100 UNIT/ML FlexPen, INJECT 25 UNITS INTO THE SKIN 3 (THREE) TIMES DAILY WITH MEALS., Disp: 90 mL, Rfl: 1 .  terconazole (TERAZOL 7) 0.4 % vaginal cream, Place 1 applicator vaginally at bedtime., Disp: 45 g, Rfl: 0 .  valsartan (DIOVAN) 320 MG tablet, Take 160 mg by mouth daily., Disp: , Rfl: 1 .  amoxicillin-clavulanate (AUGMENTIN) 875-125 MG tablet, Take 1 tablet by mouth 2 (two) times daily., Disp: 20 tablet, Rfl: 0 .  furosemide (LASIX) 20 MG tablet, Take 1 tablet (20 mg total) by mouth 2 (two) times daily., Disp: 180 tablet, Rfl: 3  EXAM:  Vitals:   08/13/16 1631  BP: 112/74  Pulse: 93  Temp: 98.4 F (36.9 C)    Body mass index is 50.48 kg/m.  GENERAL: vitals reviewed and listed above, alert, oriented, appears well hydrated and in no acute distress  HEENT: atraumatic, conjunttiva clear, no obvious abnormalities on inspection of external nose and ears, normal appearance of ear canals and TMs except for white MEE R with bulging and dul TM, thick nasal congestion, mild post oropharyngeal erythema with PND, no tonsillar edema or exudate, no sinus TTP  NECK: no obvious masses on inspection  LUNGS: clear to auscultation bilaterally, no wheezes, rales or rhonchi, good air movement  CV: HRRR, no peripheral edema  MS: moves all extremities without noticeable abnormality  PSYCH: pleasant and cooperative, no obvious depression or anxiety  ASSESSMENT AND PLAN:  Discussed the following assessment and plan:  Dysuria - Plan: POC Urinalysis Dipstick  Upper respiratory tract infection, unspecified  type  Acute suppurative otitis media of right ear without spontaneous rupture of tympanic membrane, recurrence not specified  -given HPI and exam findings today, a serious infection or illness is unlikely. We discussed potential etiologies, with AOM 2ndary to VURI or mild influenza being most likely. We discussed treatment side effects, likely course, transmission, and signs of developing a serious illness.Opted to treat with Augmentin and tessalon. Alb prn - though it seem resp symptoms resolved. Udip ok except sugar (not a new finding)...culture pending.  -of course, we advised to return or notify a doctor immediately if  symptoms worsen or persist or new concerns arise. Addendum: ucx reviewed. < 10,000 multiple organisms, on Augmentin.   Patient Instructions  Please take the augmentin as instructed.  Albuterol as needed and continue your allergy medications.  Tessalon as needed for cough.  I hope you are feeling better soon! Seek care immediately if worsening, new concerns or you are not improving with treatment.     Kriste Basque R., DO

## 2016-08-13 NOTE — Progress Notes (Signed)
Pre visit review using our clinic review tool, if applicable. No additional management support is needed unless otherwise documented below in the visit note. 

## 2016-08-13 NOTE — Patient Instructions (Signed)
Please take the augmentin as instructed.  Albuterol as needed and continue your allergy medications.  Tessalon as needed for cough.  I hope you are feeling better soon! Seek care immediately if worsening, new concerns or you are not improving with treatment.

## 2016-08-14 LAB — URINE CULTURE

## 2016-08-26 ENCOUNTER — Other Ambulatory Visit: Payer: Self-pay | Admitting: Internal Medicine

## 2016-08-31 ENCOUNTER — Other Ambulatory Visit: Payer: Self-pay

## 2016-08-31 MED ORDER — BAYER MICROLET LANCETS MISC: 5 refills | Status: DC

## 2016-08-31 MED ORDER — BAYER CONTOUR NEXT LINK W/DEVICE KIT: PACK | 0 refills | Status: DC

## 2016-08-31 MED ORDER — GLUCOSE BLOOD VI STRP: ORAL_STRIP | 5 refills | Status: DC

## 2016-09-03 ENCOUNTER — Other Ambulatory Visit: Payer: Self-pay

## 2016-09-03 MED ORDER — BAYER MICROLET LANCETS MISC: 5 refills | Status: AC

## 2016-09-03 MED ORDER — BAYER CONTOUR MONITOR W/DEVICE KIT: PACK | 0 refills | Status: AC

## 2016-09-03 MED ORDER — GLUCOSE BLOOD VI STRP: ORAL_STRIP | 5 refills | Status: AC

## 2016-09-04 ENCOUNTER — Other Ambulatory Visit: Payer: Self-pay | Admitting: Family Medicine

## 2016-09-04 DIAGNOSIS — Z760 Encounter for issue of repeat prescription: Secondary | ICD-10-CM

## 2016-09-07 ENCOUNTER — Encounter: Payer: Self-pay | Admitting: Family Medicine

## 2016-09-07 ENCOUNTER — Ambulatory Visit (INDEPENDENT_AMBULATORY_CARE_PROVIDER_SITE_OTHER): Payer: BLUE CROSS/BLUE SHIELD | Admitting: Family Medicine

## 2016-09-07 VITALS — BP 136/80 | HR 100 | Resp 12 | Ht 64.0 in | Wt 289.4 lb

## 2016-09-07 DIAGNOSIS — N390 Urinary tract infection, site not specified: Secondary | ICD-10-CM | POA: Diagnosis not present

## 2016-09-07 DIAGNOSIS — E1165 Type 2 diabetes mellitus with hyperglycemia: Secondary | ICD-10-CM

## 2016-09-07 DIAGNOSIS — R350 Frequency of micturition: Secondary | ICD-10-CM

## 2016-09-07 DIAGNOSIS — R319 Hematuria, unspecified: Secondary | ICD-10-CM | POA: Diagnosis not present

## 2016-09-07 DIAGNOSIS — R1030 Lower abdominal pain, unspecified: Secondary | ICD-10-CM | POA: Diagnosis not present

## 2016-09-07 DIAGNOSIS — Z794 Long term (current) use of insulin: Secondary | ICD-10-CM

## 2016-09-07 DIAGNOSIS — IMO0002 Reserved for concepts with insufficient information to code with codable children: Secondary | ICD-10-CM

## 2016-09-07 LAB — POCT URINALYSIS DIPSTICK
Blood, UA: NEGATIVE
Nitrite, UA: POSITIVE
PROTEIN UA: NEGATIVE
Urobilinogen, UA: 2 E.U./dL — AB
pH, UA: 6 (ref 5.0–8.0)

## 2016-09-07 MED ORDER — SULFAMETHOXAZOLE-TRIMETHOPRIM 800-160 MG PO TABS: 1.0000 | ORAL_TABLET | Freq: Two times a day (BID) | ORAL | 0 refills | Status: AC

## 2016-09-07 NOTE — Progress Notes (Signed)
HPI:   [name] is a 51 y.o. female, who is here today complaining of 3 days of lower abdominal pain and urinary symptoms.  She was seen on 08/13/16 because dysuria. U/A 08/13/16: GLU 2000 +, rest negative.   Ucx :Three or more organisms present,each greater than 10,000 CFU/mL.These organisms,commonly found on external and internal genitalia,are considered to be colonizers.No further testing performed.   Dysuria: More like pressure sensation with voiding.  Urinary frequency: unchanged from baseline. Urinary urgency: Denies. Incontinence: Denies Gross hematuria: She states that she saw "a drop" in toilet once after urination.  Abdominal pain: Suprapubic,constant, like  "the most intense menstrual pain", cramp, no radiated. She denies nausea or vomiting,abnormal vaginal bleeding in between menses or vaginal discharge. + Constipation, she takes Linzess, has bowel movements daily, denies blood in stool.  She was referred to GI 01/2016, she did not show to appt.  LMP:7 weeks ago.  Hx of heavy menses with clots, bleeding goes through tampon and large pad. Hx of iron deficiency anemia.  According to pt, she was supposed to follow with her gyn, Dr Phineas Real, to have sonohysterogram.  She saw Dr Eliane Decree) on 03/2016 because dysuria and pelvic abdominal pain.  Sexual activity:Denies any sex intercourse in the past 1.5 years. Hx of UTI: About 1-2 per year.  OTC medications for this problem: Urostat, which helps some with dysuria.  Hx of DM II.  Lab Results  Component Value Date   HGBA1C 7.3 (H) 12/19/2015   She has not followed with endocrinologists, states that she is planning on arranging appt. Reporting BS's 120's-130's.    Review of Systems  Constitutional: Positive for chills and fatigue. Negative for appetite change, fever and unexpected weight change.  HENT: Negative for mouth sores and nosebleeds.   Respiratory: Negative for shortness of breath and  wheezing.   Cardiovascular: Negative for chest pain and leg swelling.  Gastrointestinal: Positive for abdominal pain and constipation. Negative for blood in stool, nausea and vomiting.       No changes in bowel habits.  Endocrine: Negative for cold intolerance, heat intolerance, polydipsia, polyphagia and polyuria.  Genitourinary: Positive for frequency, menstrual problem, pelvic pain and urgency. Negative for decreased urine volume, dysuria, flank pain, vaginal bleeding and vaginal discharge.  Musculoskeletal: Negative for back pain and myalgias.  Skin: Negative for pallor and rash.  Neurological: Negative for syncope and weakness.  Psychiatric/Behavioral: Negative for confusion. The patient is nervous/anxious.       Current Outpatient Prescriptions on File Prior to Visit  Medication Sig Dispense Refill  . albuterol (PROAIR HFA) 108 (90 Base) MCG/ACT inhaler Inhale 2 puffs into the lungs 4 (four) times daily. 18 g 3  . atorvastatin (LIPITOR) 10 MG tablet Take 1 tablet (10 mg total) by mouth daily. 90 tablet 0  . BAYER MICROLET LANCETS lancets Use as instructed to check sugar up to 6 times daily 600 each 5  . Blood Glucose Monitoring Suppl (BAYER CONTOUR MONITOR) w/Device KIT Use to check sugar daily 1 each 0  . canagliflozin (INVOKANA) 300 MG TABS tablet Take 1 tablet (300 mg total) by mouth daily. 90 tablet 3  . citalopram (CELEXA) 40 MG tablet Take 40 mg by mouth daily.    . clobetasol cream (TEMOVATE) 3.79 % Apply 1 application topically 2 (two) times daily. Up to 2 weeks at the time 45 g 1  . Famotidine (PEPCID AC PO) Take 1 tablet by mouth every 6 (six) hours as needed (heartburn).    Marland Kitchen  fluticasone (FLONASE) 50 MCG/ACT nasal spray PLACE 2 SPRAYS INTO BOTH NOSTRILS DAILY. 16 g 3  . fluticasone (FLOVENT HFA) 44 MCG/ACT inhaler Inhale 1 puff into the lungs 2 (two) times daily as needed (shortness of breath).    Marland Kitchen glucose blood (BAYER CONTOUR TEST) test strip Use as instructed to check  sugar 6 times daily 600 each 5  . hydroquinone 4 % cream APPLY TO AFFECTED AREA TWICE A DAY 28.35 g 2  . ibuprofen (ADVIL,MOTRIN) 600 MG tablet TAKE 1 TABLET BY MOUTH EVERY 8 HOURS AS NEEDED 60 tablet 0  . insulin aspart (NOVOLOG FLEXPEN) 100 UNIT/ML FlexPen INJECT 25 UNITS INTO THE SKIN 3 (THREE) TIMES DAILY 15 min before MEALS. 90 mL 1  . Insulin Glargine (TOUJEO SOLOSTAR) 300 UNIT/ML SOPN Inject 75 Units into the skin daily. 21 pen 1  . Insulin Pen Needle 31G X 5 MM MISC Use 4x a day 400 each 3  . metFORMIN (GLUCOPHAGE-XR) 500 MG 24 hr tablet Take 2 tablets (1,000 mg total) by mouth 2 (two) times daily. 360 tablet 3  . metoprolol succinate (TOPROL XL) 25 MG 24 hr tablet Take one tablet by mouth daily.( Take with 50 mg to total 75 mg daily.) 90 tablet 3  . metoprolol succinate (TOPROL-XL) 50 MG 24 hr tablet Take one tablet by mouth daily (Take with 25 mg tablet to total 75 mg daily) 90 tablet 3  . Multiple Vitamins-Minerals (MULTIVITAMIN WITH MINERALS) tablet Take 1 tablet by mouth daily.    Marland Kitchen NOVOLOG FLEXPEN 100 UNIT/ML FlexPen INJECT 25 UNITS INTO THE SKIN 3 (THREE) TIMES DAILY WITH MEALS. 90 mL 1  . terconazole (TERAZOL 7) 0.4 % vaginal cream Place 1 applicator vaginally at bedtime. 45 g 0  . valsartan (DIOVAN) 320 MG tablet Take 160 mg by mouth daily.  1  . furosemide (LASIX) 20 MG tablet Take 1 tablet (20 mg total) by mouth 2 (two) times daily. 180 tablet 3   No current facility-administered medications on file prior to visit.      Past Medical History:  Diagnosis Date  . Allergic rhinitis   . Anemia   . Anxiety   . Asthma, mild intermittent 12/19/2015  . Depression   . Diabetes mellitus type 2 in obese ([place])   . Hyperlipidemia   . Hypertension   . Morbid obesity (HCC)    Allergies  Allergen Reactions  . Ramipril Shortness Of Breath    Social History   Social History  . Marital status: Married    Spouse name: N/A  . Number of children: N/A  . Years of education: N/A    Social History Main Topics  . Smoking status: Never Smoker  . Smokeless tobacco: Never Used     Comment: Married  . Alcohol use No  . Drug use: No  . Sexual activity: Not Currently   Other Topics Concern  . None   Social History Narrative  . None    Vitals:   09/07/16 1531  BP: 136/80  Pulse: 100  Resp: 12  O2 sat at RA 98% Body mass index is 49.67 kg/m.   Physical Exam  Nursing note and vitals reviewed. Constitutional: She is oriented to person, place, and time. She appears well-developed. She appears distressed (due to pain).  HENT:  Head: Atraumatic.  Mouth/Throat: Oropharynx is clear and moist and mucous membranes are normal.  Eyes: Conjunctivae are normal.  Cardiovascular: Normal rate and regular rhythm.   Respiratory: Effort normal and breath sounds  normal. No respiratory distress.  GI: Soft. There is no hepatomegaly. There is tenderness in the suprapubic area. There is no rigidity, no rebound, no guarding and no CVA tenderness.  Question of suprapubic mass, irregular contours, ? Fibroid vs scar tissue.   Musculoskeletal: She exhibits edema (Trace LE edema , bilateral). She exhibits no tenderness.  Neurological: She is alert and oriented to person, place, and time. She has normal strength. Gait normal.  Skin: Skin is warm. No erythema.  Psychiatric: Her speech is normal. Her mood appears anxious.  Appropriately groomed, good eye contact.     ASSESSMENT AND PLAN:   Heather Myers was seen today for urinary frequency.  Diagnoses and all orders for this visit:  Lab Results  Component Value Date   WBC 12.7 (H) 09/07/2016   HGB 9.5 (L) 09/07/2016   HCT 31.4 (L) 09/07/2016   MCV 75.5 (L) 09/07/2016   PLT 500.0 (H) 09/07/2016    Abdominal pain, lower  Plan discussed possible etiologies: Gynecologic, GI, hematologic among some. Clearly instructed about warning signs. She was supposed to follow up with her gynecologist to have hysterogram done, strongly  recommend arranging appt.   -     CBC with Differential  Frequent urination  Possible causes of urinary symptoms discussed. Urine dipstick with pos glucose and leuk, neg for blood.  -     POCT urinalysis dipstick -     Urine culture  Urinary tract infection with hematuria, site unspecified   Ucx ordered.   Empiric abx treatment started today and will be tailored according to Ucx results and susceptibility report.  Clearly instructed about warning signs. F/U if symptoms persist.  -     Urine culture -     sulfamethoxazole-trimethoprim (BACTRIM DS,SEPTRA DS) 800-160 MG tablet; Take 1 tablet by mouth 2 (two) times daily.  Insulin dependent type 2 diabetes mellitus, uncontrolled (HCC)  It seems poorly controlled even though reported BS's are < 140. She has not followed with endocrinologists sine 11/2015. We discussed some possible complications of poorly controlled diabetes.   She is not compliant with f/u visits, has not followed with gyn,endocrinologists,and missed appt with GI.    -Ms.Ahniyah Giancola was advised to return or notify a doctor immediately if symptoms worsen or persist or new concerns arise.     Hx of fibroids, gyn was planning on u/s. LMP 7 weks, no sex in 1.5 years.  Hailei Besser G. [place], MD  [facility]. [place] office.

## 2016-09-07 NOTE — Patient Instructions (Signed)
  [name] I have seen you today for an acute visit.  A few things to remember from today's visit:   Frequent urination - Plan: POCT urinalysis dipstick, Urine culture  Insulin dependent type 2 diabetes mellitus, uncontrolled (HCC)  Abdominal pain, lower - Plan: CBC with Differential  Urinary tract infection with hematuria, site unspecified - Plan: Urine culture, sulfamethoxazole-trimethoprim (BACTRIM DS,SEPTRA DS) 800-160 MG tablet    Adequate fluid intake, avoid holding urine for long hours, and over the counter Vit C OR cranberry capsules might help.  Today we will treat empirically with antibiotic, which we might need to change when urine culture comes back depending of bacteria susceptibility.  Seek immediate medical attention if severe abdominal pain, vomiting, fever/chills, or worsening symptoms. F/U if symptomatic are not any better after 2-3 days of antibiotic treatment.   Please arrange appointment with your gynecologists and endocrinologists.     Follow a bland diet for the next few days, small meals, adequate hydration.   GET HELP RIGHT AWAY IF:   The pain is does not go away within 2 hours.  Sudden severe/worsening pain.  You keep throwing up (vomiting).  The pain changes and is only in the right or left part of the belly.  Not being able to pass gas or poop.  You have bloody or tarry looking poop.   MAKE SURE YOU:   Understand these instructions.  Will watch your condition.  Will get help right away if you are not doing well or get worse.   If symptoms are persistent please arrange a follow up appointment.     In general please monitor for signs of worsening symptoms and seek immediate medical attention if any concerning.  If symptoms are not resolved in 3-4 days you should schedule a follow up appointment, before if needed.  Please be sure you have an appointment already scheduled with your PCP before you leave today.

## 2016-09-08 ENCOUNTER — Encounter: Payer: Self-pay | Admitting: Family Medicine

## 2016-09-08 LAB — CBC WITH DIFFERENTIAL/PLATELET
BASOS PCT: 0.7 % (ref 0.0–3.0)
Basophils Absolute: 0.1 10*3/uL (ref 0.0–0.1)
EOS ABS: 0.5 10*3/uL (ref 0.0–0.7)
Eosinophils Relative: 3.8 % (ref 0.0–5.0)
HCT: 31.4 % — ABNORMAL LOW (ref 36.0–46.0)
Hemoglobin: 9.5 g/dL — ABNORMAL LOW (ref 12.0–15.0)
Lymphocytes Relative: 23.4 % (ref 12.0–46.0)
Lymphs Abs: 3 10*3/uL (ref 0.7–4.0)
MCHC: 30.3 g/dL (ref 30.0–36.0)
MCV: 75.5 fl — ABNORMAL LOW (ref 78.0–100.0)
MONO ABS: 1 10*3/uL (ref 0.1–1.0)
Monocytes Relative: 8.1 % (ref 3.0–12.0)
NEUTROS ABS: 8.1 10*3/uL — AB (ref 1.4–7.7)
NEUTROS PCT: 64 % (ref 43.0–77.0)
PLATELETS: 500 10*3/uL — AB (ref 150.0–400.0)
RBC: 4.16 Mil/uL (ref 3.87–5.11)
RDW: 17.3 % — AB (ref 11.5–15.5)
WBC: 12.7 10*3/uL — ABNORMAL HIGH (ref 4.0–10.5)

## 2016-09-08 LAB — URINE CULTURE: ORGANISM ID, BACTERIA: NO GROWTH

## 2016-09-09 ENCOUNTER — Ambulatory Visit (INDEPENDENT_AMBULATORY_CARE_PROVIDER_SITE_OTHER): Payer: BLUE CROSS/BLUE SHIELD | Admitting: Obstetrics and Gynecology

## 2016-09-09 ENCOUNTER — Encounter: Payer: Self-pay | Admitting: Gynecology

## 2016-09-09 ENCOUNTER — Telehealth: Payer: Self-pay | Admitting: Obstetrics and Gynecology

## 2016-09-09 ENCOUNTER — Encounter: Payer: Self-pay | Admitting: Family Medicine

## 2016-09-09 ENCOUNTER — Encounter: Payer: Self-pay | Admitting: Obstetrics and Gynecology

## 2016-09-09 VITALS — BP 144/78 | HR 78 | Resp 16 | Ht 65.0 in | Wt 289.0 lb

## 2016-09-09 DIAGNOSIS — N939 Abnormal uterine and vaginal bleeding, unspecified: Secondary | ICD-10-CM

## 2016-09-09 DIAGNOSIS — N719 Inflammatory disease of uterus, unspecified: Secondary | ICD-10-CM | POA: Diagnosis not present

## 2016-09-09 DIAGNOSIS — R102 Pelvic and perineal pain: Secondary | ICD-10-CM

## 2016-09-09 DIAGNOSIS — N3289 Other specified disorders of bladder: Secondary | ICD-10-CM | POA: Diagnosis not present

## 2016-09-09 DIAGNOSIS — N946 Dysmenorrhea, unspecified: Secondary | ICD-10-CM

## 2016-09-09 DIAGNOSIS — R300 Dysuria: Secondary | ICD-10-CM

## 2016-09-09 DIAGNOSIS — D500 Iron deficiency anemia secondary to blood loss (chronic): Secondary | ICD-10-CM

## 2016-09-09 LAB — CBC WITH DIFFERENTIAL/PLATELET
BASOS PCT: 0 %
Basophils Absolute: 0 cells/uL (ref 0–200)
EOS ABS: 470 {cells}/uL (ref 15–500)
Eosinophils Relative: 5 %
HEMATOCRIT: 30.7 % — AB (ref 35.0–45.0)
HEMOGLOBIN: 9.1 g/dL — AB (ref 11.7–15.5)
LYMPHS ABS: 2444 {cells}/uL (ref 850–3900)
LYMPHS PCT: 26 %
MCH: 22.6 pg — ABNORMAL LOW (ref 27.0–33.0)
MCHC: 29.6 g/dL — ABNORMAL LOW (ref 32.0–36.0)
MCV: 76.2 fL — AB (ref 80.0–100.0)
MONO ABS: 470 {cells}/uL (ref 200–950)
MPV: 8.5 fL (ref 7.5–12.5)
Monocytes Relative: 5 %
Neutro Abs: 6016 cells/uL (ref 1500–7800)
Neutrophils Relative %: 64 %
PLATELETS: 544 10*3/uL — AB (ref 140–400)
RBC: 4.03 MIL/uL (ref 3.80–5.10)
RDW: 16.6 % — AB (ref 11.0–15.0)
WBC: 9.4 10*3/uL (ref 3.8–10.8)

## 2016-09-09 LAB — HEMOGLOBIN, FINGERSTICK: Hemoglobin, fingerstick: 9.1 g/dL — ABNORMAL LOW (ref 12.0–15.0)

## 2016-09-09 LAB — POCT URINALYSIS DIPSTICK
Bilirubin, UA: NEGATIVE
Ketones, UA: NEGATIVE
Leukocytes, UA: NEGATIVE
Protein, UA: NEGATIVE
Urobilinogen, UA: NEGATIVE E.U./dL — AB
pH, UA: 6 (ref 5.0–8.0)

## 2016-09-09 MED ORDER — MEDROXYPROGESTERONE ACETATE 10 MG PO TABS: ORAL_TABLET | ORAL | 0 refills | Status: DC

## 2016-09-09 NOTE — Progress Notes (Signed)
51 y.o. [id number] Legally SeparatedAfrican AmericanF here c/o pelvic pain and irregular menstrual cycles. Patient seen PCP Monday and was given Bactrim for UTI symtpoms. 5-14-18Urine Culture- negative. Patient still having dysuria and urinary frequency and urgency.  The patient has a mirena IUD, strings not seen in 12/17.  Since January cycles have been irregular every 3-7 weeks. Typically bleeds for 4-8 days. Normally the first 2-3 days are very heavy. Saturates a super + tampon in 1 hour, passing huge blood clots. She has severe dysmenorrhea, awful when she passes a clot. She is taking lots of Ibuprofen.   4-5 years ago she had her right ovary removed with GYN onc, it was benign. Last IUD was placed in 2014. Prior to 2014 she had a mirena IUD without bleeding. With the second IUD she started having irregular bleeding. She was given megace for heavy bleeding in 2014 for 3 weeks for heavy bleeding. Her diabetes control worsened with the megace. When she was seen in December, IUD string wasn't seen.   Last Thursday she was having a lot of lower abdominal pain and urinary symptoms and was treated for a UTI but the culture was negative.  Currently she c/o intense BLQ abdominal pain, feels worse than her typical menstrual cramps. She is having suprapubic pain. Constipation since Thursday, she takes linezess as needed. Last BM was last night, okay with the medication. This pain started on Thursday, but her cycle didn't start until Yesterday.  She doesn't typically have pain prior to her cycle. She was having bladder spasms, dysuria (slight), more pain after she voids. No urinary frequency (no more than normal), no urgency. Feels she is emptying her bladder okay, normal stream. She did notice some blood with wiping after urinating last week.  C/O fatigue. CBC from 09/07/16 showed a slightly increased WBC, hgb was 9.5  Normal TSH in 12/17.  Not sexually active currently.   3 weeks ago she was treated for an  otitis and a UTI (GBS)   Period Duration (Days): 7-8 days  Period Pattern: (!) Irregular Menstrual Flow: Heavy Menstrual Control: Maxi pad, Tampon Menstrual Control Change Freq (Hours): changes tampon and pad every 1-2 hours  Dysmenorrhea: (!) Severe Dysmenorrhea Symptoms: Cramping  Patient's last menstrual period was 09/08/2016.          Sexually active: No.  The current method of family planning is none.    Exercising: No.  The patient does not participate in regular exercise at present. Smoker:  no  Health Maintenance: Pap:  03/2016 WNL per patient  History of abnormal Pap:  Yes - had cryosurgery  MMG:  2015 WNL per PAtient  Colonoscopy:  Never BMD:   Never TDaP:  unsure Gardasil: N/A   reports that she has never smoked. She has never used smokeless tobacco. She reports that she does not drink alcohol or use drugs. She owns a mental health agency. Kids are 22 and 19 (daughter and son)  Past Medical History:  Diagnosis Date  . Allergic rhinitis   . Anemia   . Anxiety   . Asthma, mild intermittent 12/19/2015  . Depression   . Diabetes mellitus type 2 in obese ([place])   . Hyperlipidemia   . Hypertension   . Morbid obesity ([place])     Past Surgical History:  Procedure Laterality Date  . ABDOMINAL SURGERY     chronic abd wall abscess  . CESAREAN SECTION     x's 2  . CHOLECYSTECTOMY    . LIPOMA EXCISION  08/03/2011   Procedure: EXCISION LIPOMA;  Surgeon: Harl Bowie, MD;  Location: [facility];  Service: General;  Laterality: Right;  wide excision chronic abscess Right lower abdominal wall  . OOPHORECTOMY     R ovary removed at [facility]      Current Outpatient Prescriptions  Medication Sig Dispense Refill  . albuterol (PROAIR HFA) 108 (90 Base) MCG/ACT inhaler Inhale 2 puffs into the lungs 4 (four) times daily. 18 g 3  . atorvastatin (LIPITOR) 10 MG tablet Take 1 tablet (10 mg total) by mouth daily. 90 tablet 0  . BAYER MICROLET LANCETS  lancets Use as instructed to check sugar up to 6 times daily 600 each 5  . Blood Glucose Monitoring Suppl (BAYER CONTOUR MONITOR) w/Device KIT Use to check sugar daily 1 each 0  . canagliflozin (INVOKANA) 300 MG TABS tablet Take 1 tablet (300 mg total) by mouth daily. 90 tablet 3  . citalopram (CELEXA) 40 MG tablet Take 40 mg by mouth daily.    . clobetasol cream (TEMOVATE) 3.66 % Apply 1 application topically 2 (two) times daily. Up to 2 weeks at the time 45 g 1  . Famotidine (PEPCID AC PO) Take 1 tablet by mouth every 6 (six) hours as needed (heartburn).    . fluticasone (FLONASE) 50 MCG/ACT nasal spray PLACE 2 SPRAYS INTO BOTH NOSTRILS DAILY. 16 g 3  . fluticasone (FLOVENT HFA) 44 MCG/ACT inhaler Inhale 1 puff into the lungs 2 (two) times daily as needed (shortness of breath).    . furosemide (LASIX) 20 MG tablet Take 1 tablet (20 mg total) by mouth 2 (two) times daily. 180 tablet 3  . glucose blood (BAYER CONTOUR TEST) test strip Use as instructed to check sugar 6 times daily 600 each 5  . hydroquinone 4 % cream APPLY TO AFFECTED AREA TWICE A DAY 28.35 g 2  . ibuprofen (ADVIL,MOTRIN) 600 MG tablet TAKE 1 TABLET BY MOUTH EVERY 8 HOURS AS NEEDED 60 tablet 0  . insulin aspart (NOVOLOG FLEXPEN) 100 UNIT/ML FlexPen INJECT 25 UNITS INTO THE SKIN 3 (THREE) TIMES DAILY 15 min before MEALS. 90 mL 1  . Insulin Glargine (TOUJEO SOLOSTAR) 300 UNIT/ML SOPN Inject 75 Units into the skin daily. 21 pen 1  . Insulin Pen Needle 31G X 5 MM MISC Use 4x a day 400 each 3  . metFORMIN (GLUCOPHAGE-XR) 500 MG 24 hr tablet Take 2 tablets (1,000 mg total) by mouth 2 (two) times daily. 360 tablet 3  . metoprolol succinate (TOPROL XL) 25 MG 24 hr tablet Take one tablet by mouth daily.( Take with 50 mg to total 75 mg daily.) 90 tablet 3  . metoprolol succinate (TOPROL-XL) 50 MG 24 hr tablet Take one tablet by mouth daily (Take with 25 mg tablet to total 75 mg daily) 90 tablet 3  . Multiple Vitamins-Minerals (MULTIVITAMIN  WITH MINERALS) tablet Take 1 tablet by mouth daily.    Marland Kitchen NOVOLOG FLEXPEN 100 UNIT/ML FlexPen INJECT 25 UNITS INTO THE SKIN 3 (THREE) TIMES DAILY WITH MEALS. 90 mL 1  . sulfamethoxazole-trimethoprim (BACTRIM DS,SEPTRA DS) 800-160 MG tablet Take 1 tablet by mouth 2 (two) times daily. 10 tablet 0  . terconazole (TERAZOL 7) 0.4 % vaginal cream Place 1 applicator vaginally at bedtime. 45 g 0  . valsartan (DIOVAN) 320 MG tablet Take 160 mg by mouth daily.  1   No current facility-administered medications for this visit.     Family History  Problem Relation  Age of Onset  . Diabetes Mother   . Hypertension Mother   . Other Mother        renal failure  . Diabetes Other   . Hypertension Father   . Cancer Father     Review of Systems  Constitutional: Negative.   HENT: Negative.   Eyes: Negative.   Respiratory: Negative.   Cardiovascular: Negative.   Gastrointestinal: Negative.   Endocrine: Negative.   Genitourinary: Positive for dysuria, frequency, menstrual problem, pelvic pain and urgency.       Irregular menstrual cycles   Musculoskeletal: Negative.   Skin: Negative.   Allergic/Immunologic: Negative.   Neurological: Negative.   Psychiatric/Behavioral: Negative.     Exam:   BP (!) 144/78 (BP Location: Right Arm, Patient Position: Sitting, Cuff Size: Normal)   Pulse 78   Resp 16   Ht _0  (1.651 m)   Wt 289 lb (131.1 kg)   LMP 09/08/2016   BMI 48.09 kg/m   Weight change: _1 @ Height:   Height: _2  (165.1 cm)  Ht Readings from Last 3 Encounters:  09/09/16 _3  (1.651 m)  09/07/16 _4  (1.626 m)  08/13/16 _5  (1.626 m)    General appearance: alert, cooperative and appears stated age Head: Normocephalic, without obvious abnormality, atraumatic Neck: no adenopathy, supple, symmetrical, trachea midline and thyroid normal to inspection and palpation Lungs: clear to auscultation bilaterally Cardiovascular: regular rate and rhythm Abdomen: soft, non-tender;  bowel sounds normal; no masses,  no organomegaly Extremities: extremities normal, atraumatic, no cyanosis or edema Skin: Skin color, texture, turgor normal. No rashes or lesions Lymph nodes: Cervical, supraclavicular, and axillary nodes normal. No abnormal inguinal nodes palpated Neurologic: Grossly normal   Pelvic: External genitalia:  no lesions              Urethra:  normal appearing urethra with no masses, tenderness or lesions              Bartholins and Skenes: normal                 Vagina: normal appearing vagina with normal color and discharge, no lesions              Cervix: no lesions, actively bleeding, passing clots, extremely heavy               Bimanual Exam:  Uterus: limited exam, no masses, mildly tender              Adnexa: exam limited by BMI, no masses, mildly tender   The risks of endometrial biopsy were reviewed and a consent was obtained.  A speculum was placed in the vagina and the cervix was cleansed with betadine. A tenaculum was placed on the cervix and the uterine evacuator was placed into the endometrial cavity. The uterus sounded to 8 cm. The endometrial curettage was performed, very large amounts of clots and tissue was obtained. After approximately 6 passes, the cavity only felt partially gritty, but the patient was too uncomfortable so the procedure was stopped. The tenaculum and speculum were removed. There were no complications.                  Chaperone was present for exam.  Review of her old records, she has a h/o chronic anemia from heavy cycles. Hgb in 8/17 was 10.9 U/S report from 04/07/13:  Uterus measuring 9.6 x 7.0 x 5.5 cm with endometrial stripe of 7.5 mm. Patient had 3 fibroids the largest  one measuring 17 x 12 mm. 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 No free fluid seen. The cervix had previously been cleansed with Betadine solution and a sterile Pipelle was introduced into the uterine cavity whereby  normal saline was instilled. There was no intracavitary defect noted.  A:  Menorrhagia, suspected anovulatory bleed, but baseline menorrhagia  H/O mirena IUD, strings not seen  Bladder spasms, nitrates in urine, negative culture from earlier this week (also +nitrates)  P:   CBC with diff, Ferritin  Urine for ua, c&s  Hgb 9.1 gm/dl  Start provera 10 mg, take 2 tonight, then 1 po BID until bleeding gets to spotting, then change to 1 po qd  F/U next week for an ultrasound, possible sonohysterogram, if no IUD seen she needs an abdominal X-Ray  Start oral iron, she has GI issues and constipation, will set her up for a hematology consult for possible iron transfusion  Told her she can take oral magnesium with the oral iron  Told her not to take more than 800 mg of ibuprofen every 8 hours (at times she has taken 1600 mg at a time), she can add tylenol if needed. She declined a script for oxycodone.    CC: Dr Betty Martinique

## 2016-09-09 NOTE — Patient Instructions (Signed)
Start oral iron (ie ferrous gluconate or slow fe) 2 x a day. You can take magnesium supplements 250-500 mg a day to prevent constipation  Call if you are saturating >1 super tampon an hour, if you feel light headed or dizzy or with any other concerns

## 2016-09-09 NOTE — Telephone Encounter (Signed)
Called patient to review benefits for a recommended ultrasound. Left Voicemail requesting a call back. °

## 2016-09-10 ENCOUNTER — Telehealth: Payer: Self-pay | Admitting: Obstetrics and Gynecology

## 2016-09-10 LAB — URINALYSIS, MICROSCOPIC ONLY
Bacteria, UA: NONE SEEN [HPF]
CASTS: NONE SEEN [LPF]
CRYSTALS: NONE SEEN [HPF]
WBC UA: NONE SEEN WBC/HPF (ref <=5)
Yeast: NONE SEEN [HPF]

## 2016-09-10 LAB — URINE CULTURE: Organism ID, Bacteria: NO GROWTH

## 2016-09-10 LAB — TSH: TSH: 1.51 mIU/L

## 2016-09-10 NOTE — Telephone Encounter (Signed)
Called to check on the patient, the underwent an office D&C yesterday for very heavy vaginal bleeding. Left a message for her to call back (she lab results as well)

## 2016-09-11 NOTE — Telephone Encounter (Signed)
Left message to call Ashtan Laton at [phone].  Notes recorded by Romualdo Bolk, MD on 09/10/2016 at 4:53 PM EDT I called the patient today, haven't heard back from her. Please call and check on the patient tomorrow and see how she is feeling and how her bleeding is.  See prior result note and inform of labs. Thanks ------  Notes recorded by Romualdo Bolk, MD on 09/10/2016 at 9:56 AM EDT Called to check on the patient, left a message to call.  She is anemic as expected, TSH was normal, microscopic urinalysis was normal Urine culture is pending.

## 2016-09-15 ENCOUNTER — Other Ambulatory Visit: Payer: Self-pay | Admitting: Obstetrics and Gynecology

## 2016-09-15 ENCOUNTER — Ambulatory Visit (INDEPENDENT_AMBULATORY_CARE_PROVIDER_SITE_OTHER): Payer: BLUE CROSS/BLUE SHIELD

## 2016-09-15 ENCOUNTER — Ambulatory Visit (INDEPENDENT_AMBULATORY_CARE_PROVIDER_SITE_OTHER): Payer: BLUE CROSS/BLUE SHIELD | Admitting: Obstetrics and Gynecology

## 2016-09-15 ENCOUNTER — Encounter: Payer: Self-pay | Admitting: Obstetrics and Gynecology

## 2016-09-15 VITALS — BP 122/70 | HR 84 | Resp 16 | Wt 298.0 lb

## 2016-09-15 DIAGNOSIS — R102 Pelvic and perineal pain: Secondary | ICD-10-CM

## 2016-09-15 DIAGNOSIS — Z8639 Personal history of other endocrine, nutritional and metabolic disease: Secondary | ICD-10-CM | POA: Diagnosis not present

## 2016-09-15 DIAGNOSIS — T8332XA Displacement of intrauterine contraceptive device, initial encounter: Secondary | ICD-10-CM | POA: Diagnosis not present

## 2016-09-15 DIAGNOSIS — D500 Iron deficiency anemia secondary to blood loss (chronic): Secondary | ICD-10-CM

## 2016-09-15 DIAGNOSIS — N946 Dysmenorrhea, unspecified: Secondary | ICD-10-CM

## 2016-09-15 DIAGNOSIS — T8332XD Displacement of intrauterine contraceptive device, subsequent encounter: Secondary | ICD-10-CM | POA: Diagnosis not present

## 2016-09-15 DIAGNOSIS — N939 Abnormal uterine and vaginal bleeding, unspecified: Secondary | ICD-10-CM | POA: Diagnosis not present

## 2016-09-15 DIAGNOSIS — N921 Excessive and frequent menstruation with irregular cycle: Secondary | ICD-10-CM | POA: Diagnosis not present

## 2016-09-15 NOTE — Progress Notes (Signed)
GYNECOLOGY  VISIT   HPI: 51 y.o.   Legally Separated  African American  female   [phone] with Patient's last menstrual period was 09/08/2016.   here for follow up  DUB and dysmenorrhea  The patient was seen last week with AUB, very heavy, severe dysmenorrhea. She was noted to be hemorrhaging and an office D&C was performed. Hgb was 9.1, normal TSH, biopsy returned with blood and scant endometrium.  She was started on the provera last week, stopped the bleeding, she stopped it for the weekend secondary to bad hot flashes. Restarted it yesterday. Started bleeding yesterday, moderate flow. Saturating a pad every 4 hours. Her pain has resolved.  She is supposed to have a mirena IUD in place, no string seen at her exam Around 2013 she had surgery by a GYN Oncologist for a right adnexal mass. Pathology was benign. Anesthesia had trouble waking her up secondary to sleep apnea.  She had a sleep study, she was told her sleep apnea was so mild she didn't need CPAP.    GYNECOLOGIC HISTORY: Patient's last menstrual period was 09/08/2016. Contraception:none  Menopausal hormone therapy: none        OB History    Gravida Para Term Preterm AB Living   _0 SAB TAB Ectopic Multiple Live Births   1                 Patient Active Problem List   Diagnosis Date Noted  . Insulin dependent type 2 diabetes mellitus, uncontrolled ([place]) 12/19/2015  . Asthma, mild intermittent 12/19/2015  . Bilateral lower extremity edema 12/19/2015  . Rash and nonspecific skin eruption 10/19/2013  . DJD (degenerative joint disease) of knee 06/29/2013  . Unspecified constipation 05/08/2013  . Menorrhagia 03/30/2013  . Dysmenorrhea 03/30/2013  . Dyspnea 06/11/2010  . GRIEF REACTION 07/17/2009  . Edema 01/03/2009  . Morbid obesity ([place]) 12/21/2008  . Palpitations 12/21/2008  . GENITAL HERPES 03/22/2007  . HYPERLIPIDEMIA 03/22/2007  . Iron deficiency anemia 03/22/2007  . Anxiety disorder 03/22/2007  .  DEPRESSION 03/22/2007  . Essential hypertension 03/22/2007  . ALLERGIC RHINITIS 03/22/2007  . [place] DISEASE, CERVICAL 03/22/2007  . POSITIVE PPD 03/22/2007    Past Medical History:  Diagnosis Date  . Allergic rhinitis   . Anemia   . Anxiety   . Asthma, mild intermittent 12/19/2015  . Depression   . Diabetes mellitus type 2 in obese ([place])   . Hyperlipidemia   . Hypertension   . Morbid obesity ([place])     Past Surgical History:  Procedure Laterality Date  . ABDOMINAL SURGERY     chronic abd wall abscess  . CESAREAN SECTION     x's 2  . CHOLECYSTECTOMY    . LIPOMA EXCISION  08/03/2011   Procedure: EXCISION LIPOMA;  Surgeon: Harl Bowie, MD;  Location: [place];  Service: General;  Laterality: Right;  wide excision chronic abscess Right lower abdominal wall  . OOPHORECTOMY     R [facility]      Current Outpatient Prescriptions  Medication Sig Dispense Refill  . albuterol (PROAIR HFA) 108 (90 Base) MCG/ACT inhaler Inhale 2 puffs into the lungs 4 (four) times daily. 18 g 3  . atorvastatin (LIPITOR) 10 MG tablet Take 1 tablet (10 mg total) by mouth daily. 90 tablet 0  . BAYER MICROLET LANCETS lancets Use as instructed to check sugar up to 6  times daily 600 each 5  . Blood Glucose Monitoring Suppl (BAYER CONTOUR MONITOR) w/Device KIT Use to check sugar daily 1 each 0  . canagliflozin (INVOKANA) 300 MG TABS tablet Take 1 tablet (300 mg total) by mouth daily. 90 tablet 3  . citalopram (CELEXA) 40 MG tablet Take 40 mg by mouth daily.    . clobetasol cream (TEMOVATE) 2.95 % Apply 1 application topically 2 (two) times daily. Up to 2 weeks at the time 45 g 1  . Famotidine (PEPCID AC PO) Take 1 tablet by mouth every 6 (six) hours as needed (heartburn).    . fluticasone (FLONASE) 50 MCG/ACT nasal spray PLACE 2 SPRAYS INTO BOTH NOSTRILS DAILY. 16 g 3  . fluticasone (FLOVENT HFA) 44 MCG/ACT inhaler Inhale 1 puff into the lungs 2 (two) times daily as  needed (shortness of breath).    Marland Kitchen glucose blood (BAYER CONTOUR TEST) test strip Use as instructed to check sugar 6 times daily 600 each 5  . hydroquinone 4 % cream APPLY TO AFFECTED AREA TWICE A DAY 28.35 g 2  . ibuprofen (ADVIL,MOTRIN) 600 MG tablet TAKE 1 TABLET BY MOUTH EVERY 8 HOURS AS NEEDED 60 tablet 0  . insulin aspart (NOVOLOG FLEXPEN) 100 UNIT/ML FlexPen INJECT 25 UNITS INTO THE SKIN 3 (THREE) TIMES DAILY 15 min before MEALS. 90 mL 1  . Insulin Glargine (TOUJEO SOLOSTAR) 300 UNIT/ML SOPN Inject 75 Units into the skin daily. 21 pen 1  . Insulin Pen Needle 31G X 5 MM MISC Use 4x a day 400 each 3  . medroxyPROGESTERone (PROVERA) 10 MG tablet Take 2 tablets po BID until bleeding is very light or stops, then take 1 tablet a day for 2 weeks. 30 tablet 0  . metFORMIN (GLUCOPHAGE-XR) 500 MG 24 hr tablet Take 2 tablets (1,000 mg total) by mouth 2 (two) times daily. 360 tablet 3  . metoprolol succinate (TOPROL XL) 25 MG 24 hr tablet Take one tablet by mouth daily.( Take with 50 mg to total 75 mg daily.) 90 tablet 3  . metoprolol succinate (TOPROL-XL) 50 MG 24 hr tablet Take one tablet by mouth daily (Take with 25 mg tablet to total 75 mg daily) 90 tablet 3  . Multiple Vitamins-Minerals (MULTIVITAMIN WITH MINERALS) tablet Take 1 tablet by mouth daily.    Marland Kitchen NOVOLOG FLEXPEN 100 UNIT/ML FlexPen INJECT 25 UNITS INTO THE SKIN 3 (THREE) TIMES DAILY WITH MEALS. 90 mL 1  . terconazole (TERAZOL 7) 0.4 % vaginal cream Place 1 applicator vaginally at bedtime. 45 g 0  . valsartan (DIOVAN) 320 MG tablet Take 160 mg by mouth daily.  1  . furosemide (LASIX) 20 MG tablet Take 1 tablet (20 mg total) by mouth 2 (two) times daily. 180 tablet 3   No current facility-administered medications for this visit.      ALLERGIES: Ramipril  Family History  Problem Relation Age of Onset  . Diabetes Mother   . Hypertension Mother   . Other Mother        renal failure  . Diabetes Other   . Hypertension Father   .  Cancer Father     Social History   Social History  . Marital status: Legally Separated    Spouse name: N/A  . Number of children: N/A  . Years of education: N/A   Occupational History  . Not on file.   Social History Main Topics  . Smoking status: Never Smoker  . Smokeless tobacco: Never Used  Comment: Married  . Alcohol use No  . Drug use: No  . Sexual activity: Not Currently    Partners: Male    Birth control/ protection: None   Other Topics Concern  . Not on file   Social History Narrative  . No narrative on file    Review of Systems  Constitutional: Negative.   HENT: Negative.   Eyes: Negative.   Respiratory: Negative.   Cardiovascular: Negative.   Gastrointestinal: Negative.   Genitourinary:       DUB  Musculoskeletal: Negative.   Skin: Negative.   Neurological: Negative.   Endo/Heme/Allergies: Negative.   Psychiatric/Behavioral: Negative.     PHYSICAL EXAMINATION:    BP 122/70 (BP Location: Right Wrist, Patient Position: Sitting, Cuff Size: Normal)   Pulse 84   Resp 16   Wt 298 lb (135.2 kg)   LMP 09/08/2016   BMI 49.59 kg/m     General appearance: alert, cooperative and appears stated age  ASSESSMENT Menometrorrhagia, ultrasound with thin endometrial stripe (clot seen in her cervix) Dysmenorrhea Missing mirena IUD, suspect it was expulsed H/O diabetes She has a tender R adnexal cyst, supposedly had a right SO     PLAN Needs abdominal X-Ray to make sure the IUD was expulsed She is interested in a hysterectomy, doesn't want to use medication or another IUD to control her bleeding (discussed options of micronor, and another mirena IUD, I wouldn't recommend depo-provera because of risk of worsening her diabetes and risk of weight gain) Need copy of her op note and pathology report from her prior RSO Will check a HgbA1C, diabetes needs to be in good control prior to surgery Will recheck a CBC, suspect she may be more anemic (seen during  active bleeding) Will need pre-op clearance from her primary MD Will need an anesthesia consult prior to surgery Would keep her in the hospital overnight given her medical issues Discussed the risks of bleeding, infection, damage to bowel/bladder/vessels and ureters She will need to return for a pre-op   An After Visit Summary was printed and given to the patient.  25 minutes face to face time of which over 50% was spent in counseling.   CC: Dr Betty Martinique

## 2016-09-16 LAB — HEMOGLOBIN A1C
HEMOGLOBIN A1C: 7.4 % — AB (ref <5.7)
MEAN PLASMA GLUCOSE: 166 mg/dL

## 2016-09-16 LAB — CBC
HCT: 29.2 % — ABNORMAL LOW (ref 35.0–45.0)
HEMOGLOBIN: 8.3 g/dL — AB (ref 11.7–15.5)
MCH: 22.2 pg — AB (ref 27.0–33.0)
MCHC: 28.9 g/dL — ABNORMAL LOW (ref 32.0–36.0)
MCV: 78.1 fL — ABNORMAL LOW (ref 80.0–100.0)
MPV: 8.2 fL (ref 7.5–12.5)
PLATELETS: 524 10*3/uL — AB (ref 140–400)
RBC: 3.74 MIL/uL — AB (ref 3.80–5.10)
RDW: 17.2 % — ABNORMAL HIGH (ref 11.0–15.0)
WBC: 10.8 10*3/uL (ref 3.8–10.8)

## 2016-09-17 ENCOUNTER — Telehealth: Payer: Self-pay | Admitting: Obstetrics and Gynecology

## 2016-09-17 NOTE — Telephone Encounter (Signed)
Call to patient. Advised of instructions regarding iron supplement. Iron food list faxed to patient. Instructed to take with OJ and avoid taking with milk. Patient states she will have abdominal x-ray in am. She states she understands importance of this imaging. Will discuss POP at consult on 09-22-16.  Encounter closed.

## 2016-09-17 NOTE — Telephone Encounter (Signed)
Spoke with patient regarding benefit for recommended surgery. Patient understood information presented. Patient aware this is professional benefit only. Patient aware will be contacted by hospital for separate benefits. Call forward to nurse supervisor, to discuss how to proceed.  Routing to [employer]

## 2016-09-17 NOTE — Telephone Encounter (Signed)
Please have her start Ferrex 150 mg BID (I was going to send a script, but I think it's OTC). This is the iron that one of the hematologist recommend.  She definitely needs the abdominal XRAY to check for the IUD location. I'm fine with trying the micronor, but it's very important her diabetes be under the best control possible incase she does end up needing surgery.

## 2016-09-17 NOTE — Telephone Encounter (Signed)
Call received from business office. Discussed decreased hemoglobin level from last week and recommendation to proceed with referral to hematology, which is in place. She should expect call from [facility] at Dr Gustavo Lah office. Also discussed HgbA1C result as directed by Dr Oscar La, recommendation to have result be at 7 for surgery and should see PCP for possible management and clearance for surgery. (see result note) Patient reports she has an endocrinologist. She also reports she is reconsidering surgery. 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  Patient would also like Dr Oscar La to call in "stronger" prescription strength iron instead of OTC dose. When discussing POP, patient had multiple questions and was agreeable to office visit to discuss.  Appointment scheduled for Tuesday 09-22-16 with Dr Oscar La.  Requests stronger iron RX until then as she feels so fatigued.  States bleeding has improved since appointments with Dr Oscar La and beginning medication.

## 2016-09-18 ENCOUNTER — Telehealth: Payer: Self-pay | Admitting: Obstetrics and Gynecology

## 2016-09-18 ENCOUNTER — Ambulatory Visit
Admission: RE | Admit: 2016-09-18 | Discharge: 2016-09-18 | Disposition: A | Discharge status: Home / Self Care | Payer: BLUE CROSS/BLUE SHIELD | Source: Ambulatory Visit | Attending: Obstetrics and Gynecology | Admitting: Obstetrics and Gynecology

## 2016-09-18 ENCOUNTER — Other Ambulatory Visit: Payer: Self-pay | Admitting: Obstetrics and Gynecology

## 2016-09-18 DIAGNOSIS — T8332XD Displacement of intrauterine contraceptive device, subsequent encounter: Secondary | ICD-10-CM

## 2016-09-18 DIAGNOSIS — T8332XA Displacement of intrauterine contraceptive device, initial encounter: Secondary | ICD-10-CM

## 2016-09-18 DIAGNOSIS — K5900 Constipation, unspecified: Secondary | ICD-10-CM | POA: Diagnosis not present

## 2016-09-18 NOTE — Telephone Encounter (Signed)
Patient has some questions about a "a scan" she is to have done. Patient said "I have lost the information and not sure where to go".

## 2016-09-18 NOTE — Telephone Encounter (Signed)
Spoke with Heather Myers at [facility], calling to verify imaging ordered. Was advised a x-ray of Abdomen 1 view would be sufficient for imaging, ok to change order? Would like to clarify reason for xray, missing IUD. Patient is at [facility], currently on table. Advised ok to to change order to xray abdomen 1 view as requested.  Routing to provider for final review. Patient is agreeable to disposition. Will close encounter.

## 2016-09-22 ENCOUNTER — Ambulatory Visit (INDEPENDENT_AMBULATORY_CARE_PROVIDER_SITE_OTHER): Payer: BLUE CROSS/BLUE SHIELD | Admitting: Obstetrics and Gynecology

## 2016-09-22 ENCOUNTER — Ambulatory Visit: Payer: BLUE CROSS/BLUE SHIELD | Admitting: Obstetrics and Gynecology

## 2016-09-22 ENCOUNTER — Other Ambulatory Visit: Payer: Self-pay | Admitting: Family Medicine

## 2016-09-22 ENCOUNTER — Encounter: Payer: Self-pay | Admitting: Obstetrics and Gynecology

## 2016-09-22 VITALS — BP 124/60 | HR 88 | Resp 18 | Wt 294.0 lb

## 2016-09-22 DIAGNOSIS — N760 Acute vaginitis: Secondary | ICD-10-CM

## 2016-09-22 DIAGNOSIS — E1165 Type 2 diabetes mellitus with hyperglycemia: Secondary | ICD-10-CM

## 2016-09-22 DIAGNOSIS — Z3009 Encounter for other general counseling and advice on contraception: Secondary | ICD-10-CM | POA: Diagnosis not present

## 2016-09-22 DIAGNOSIS — Z862 Personal history of diseases of the blood and blood-forming organs and certain disorders involving the immune mechanism: Secondary | ICD-10-CM | POA: Diagnosis not present

## 2016-09-22 DIAGNOSIS — N921 Excessive and frequent menstruation with irregular cycle: Secondary | ICD-10-CM

## 2016-09-22 DIAGNOSIS — IMO0002 Reserved for concepts with insufficient information to code with codable children: Secondary | ICD-10-CM

## 2016-09-22 DIAGNOSIS — Z794 Long term (current) use of insulin: Principal | ICD-10-CM

## 2016-09-22 LAB — CBC
HEMATOCRIT: 28.5 % — AB (ref 35.0–45.0)
HEMOGLOBIN: 8.4 g/dL — AB (ref 11.7–15.5)
MCH: 22.8 pg — ABNORMAL LOW (ref 27.0–33.0)
MCHC: 29.5 g/dL — AB (ref 32.0–36.0)
MCV: 77.2 fL — ABNORMAL LOW (ref 80.0–100.0)
MPV: 8 fL (ref 7.5–12.5)
Platelets: 624 10*3/uL — ABNORMAL HIGH (ref 140–400)
RBC: 3.69 MIL/uL — AB (ref 3.80–5.10)
RDW: 16.9 % — ABNORMAL HIGH (ref 11.0–15.0)
WBC: 9.6 10*3/uL (ref 3.8–10.8)

## 2016-09-22 MED ORDER — BETAMETHASONE VALERATE 0.1 % EX OINT: TOPICAL_OINTMENT | CUTANEOUS | 0 refills | Status: DC

## 2016-09-22 MED ORDER — MEDROXYPROGESTERONE ACETATE 10 MG PO TABS: ORAL_TABLET | ORAL | 0 refills | Status: DC

## 2016-09-22 MED ORDER — MISOPROSTOL 200 MCG PO TABS: ORAL_TABLET | ORAL | 0 refills | Status: DC

## 2016-09-22 NOTE — Telephone Encounter (Signed)
She is following with endocrinologists for DM, so she needs to call her office. Thanks

## 2016-09-22 NOTE — Progress Notes (Signed)
GYNECOLOGY  VISIT   HPI: 51 y.o.   Legally Separated  African American  female   [phone] with Patient's last menstrual period was 09/08/2016.   here to discuss POP. Patient also c/o vaginal itching    She is being followed and treated for AUB leading to anemia. She expulsed her mirena IUD, not sure when (negative ultrasound and Xray in the last week).  She had an office D&C on 5/16. She is on daily provera 10 mg a day. Her bleeding initially slowed down, now it has picked up again. She is going through a pad or tampon every 1-1.5 hours since Saturday. Passing big clots, pushing out the tampon. She c/o fatigue, not light headed or dizzy. She c/o vulvar itching off and on for days, has been sweaty as well. No abnormal discharge  GYNECOLOGIC HISTORY: Patient's last menstrual period was 09/08/2016. Contraception:none  Menopausal hormone therapy: none         OB History    Gravida Para Term Preterm AB Living   '3 2     1 2   ' SAB TAB Ectopic Multiple Live Births   1                 Patient Active Problem List   Diagnosis Date Noted  . Insulin dependent type 2 diabetes mellitus, uncontrolled ([place]) 12/19/2015  . Asthma, mild intermittent 12/19/2015  . Bilateral lower extremity edema 12/19/2015  . Rash and nonspecific skin eruption 10/19/2013  . DJD (degenerative joint disease) of knee 06/29/2013  . Unspecified constipation 05/08/2013  . Menorrhagia 03/30/2013  . Dysmenorrhea 03/30/2013  . Dyspnea 06/11/2010  . GRIEF REACTION 07/17/2009  . Edema 01/03/2009  . Morbid obesity ([place]) 12/21/2008  . Palpitations 12/21/2008  . GENITAL HERPES 03/22/2007  . HYPERLIPIDEMIA 03/22/2007  . Iron deficiency anemia 03/22/2007  . Anxiety disorder 03/22/2007  . DEPRESSION 03/22/2007  . Essential hypertension 03/22/2007  . ALLERGIC RHINITIS 03/22/2007  . [place] DISEASE, CERVICAL 03/22/2007  . POSITIVE PPD 03/22/2007    Past Medical History:  Diagnosis Date  . Allergic rhinitis   . Anemia   .  Anxiety   . Asthma, mild intermittent 12/19/2015  . Depression   . Diabetes mellitus type 2 in obese ([place])   . Hyperlipidemia   . Hypertension   . Morbid obesity ([place])     Past Surgical History:  Procedure Laterality Date  . ABDOMINAL SURGERY     chronic abd wall abscess  . CESAREAN SECTION     x's 2  . CHOLECYSTECTOMY    . LIPOMA EXCISION  08/03/2011   Procedure: EXCISION LIPOMA;  Surgeon: Harl Bowie, MD;  Location: [facility];  Service: General;  Laterality: Right;  wide excision chronic abscess Right lower abdominal wall  . OOPHORECTOMY     R ovary [facility] at [facility]      Current Outpatient Prescriptions  Medication Sig Dispense Refill  . albuterol (PROAIR HFA) 108 (90 Base) MCG/ACT inhaler Inhale 2 puffs into the lungs 4 (four) times daily. 18 g 3  . atorvastatin (LIPITOR) 10 MG tablet Take 1 tablet (10 mg total) by mouth daily. 90 tablet 0  . BAYER MICROLET LANCETS lancets Use as instructed to check sugar up to 6 times daily 600 each 5  . Blood Glucose Monitoring Suppl (BAYER CONTOUR MONITOR) w/Device KIT Use to check sugar daily 1 each 0  . canagliflozin (INVOKANA) 300 MG TABS tablet Take 1 tablet (300 mg total) by mouth  daily. 90 tablet 3  . citalopram (CELEXA) 40 MG tablet Take 40 mg by mouth daily.    . clobetasol cream (TEMOVATE) 6.23 % Apply 1 application topically 2 (two) times daily. Up to 2 weeks at the time 45 g 1  . Famotidine (PEPCID AC PO) Take 1 tablet by mouth every 6 (six) hours as needed (heartburn).    . fluticasone (FLONASE) 50 MCG/ACT nasal spray PLACE 2 SPRAYS INTO BOTH NOSTRILS DAILY. 16 g 3  . fluticasone (FLOVENT HFA) 44 MCG/ACT inhaler Inhale 1 puff into the lungs 2 (two) times daily as needed (shortness of breath).    Marland Kitchen glucose blood (BAYER CONTOUR TEST) test strip Use as instructed to check sugar 6 times daily 600 each 5  . hydroquinone 4 % cream APPLY TO AFFECTED AREA TWICE A DAY 28.35 g 2  . ibuprofen (ADVIL,MOTRIN) 600  MG tablet TAKE 1 TABLET BY MOUTH EVERY 8 HOURS AS NEEDED 60 tablet 0  . insulin aspart (NOVOLOG FLEXPEN) 100 UNIT/ML FlexPen INJECT 25 UNITS INTO THE SKIN 3 (THREE) TIMES DAILY 15 min before MEALS. 90 mL 1  . Insulin Glargine (TOUJEO SOLOSTAR) 300 UNIT/ML SOPN Inject 75 Units into the skin daily. 21 pen 1  . Insulin Pen Needle 31G X 5 MM MISC Use 4x a day 400 each 3  . medroxyPROGESTERone (PROVERA) 10 MG tablet Take 2 tablets po BID until bleeding is very light or stops, then take 1 tablet a day for 2 weeks. 30 tablet 0  . metFORMIN (GLUCOPHAGE-XR) 500 MG 24 hr tablet Take 2 tablets (1,000 mg total) by mouth 2 (two) times daily. 360 tablet 3  . metoprolol succinate (TOPROL XL) 25 MG 24 hr tablet Take one tablet by mouth daily.( Take with 50 mg to total 75 mg daily.) 90 tablet 3  . metoprolol succinate (TOPROL-XL) 50 MG 24 hr tablet Take one tablet by mouth daily (Take with 25 mg tablet to total 75 mg daily) 90 tablet 3  . Multiple Vitamins-Minerals (MULTIVITAMIN WITH MINERALS) tablet Take 1 tablet by mouth daily.    Marland Kitchen NOVOLOG FLEXPEN 100 UNIT/ML FlexPen INJECT 25 UNITS INTO THE SKIN 3 (THREE) TIMES DAILY WITH MEALS. 90 mL 1  . terconazole (TERAZOL 7) 0.4 % vaginal cream Place 1 applicator vaginally at bedtime. 45 g 0  . valsartan (DIOVAN) 320 MG tablet Take 160 mg by mouth daily.  1  . furosemide (LASIX) 20 MG tablet Take 1 tablet (20 mg total) by mouth 2 (two) times daily. 180 tablet 3   No current facility-administered medications for this visit.      ALLERGIES: Ramipril  Family History  Problem Relation Age of Onset  . Diabetes Mother   . Hypertension Mother   . Other Mother        renal failure  . Diabetes Other   . Hypertension Father   . Cancer Father     Social History   Social History  . Marital status: Legally Separated    Spouse name: N/A  . Number of children: N/A  . Years of education: N/A   Occupational History  . Not on file.   Social History Main Topics  .  Smoking status: Never Smoker  . Smokeless tobacco: Never Used     Comment: Married  . Alcohol use No  . Drug use: No  . Sexual activity: Not Currently    Partners: Male    Birth control/ protection: None   Other Topics Concern  . Not on  file   Social History Narrative  . No narrative on file    Review of Systems  Constitutional: Positive for chills.  HENT: Negative.   Eyes: Negative.   Respiratory: Negative.   Cardiovascular: Negative.   Gastrointestinal: Negative.   Genitourinary:       Excessive bleeding  Vaginal itching   Musculoskeletal: Negative.   Skin: Negative.   Neurological: Negative.   Endo/Heme/Allergies: Negative.   Psychiatric/Behavioral: Negative.     PHYSICAL EXAMINATION:    BP 124/60 (BP Location: Right Wrist, Patient Position: Sitting, Cuff Size: Normal)   Pulse 88   Resp 18   Wt 294 lb (133.4 kg)   LMP 09/08/2016   BMI 48.92 kg/m     General appearance: alert, cooperative and appears stated age  Pelvic: External genitalia:  no lesions, + erythema              Urethra:  normal appearing urethra with no masses, tenderness or lesions              Bartholins and Skenes: normal                 Vagina: tampon was removed that had been in for 1.5 hours, mild staining. Normal appearing vagina with mild erythema, no lesions or discharge              Cervix: no lesions               Chaperone was present for exam.  Wet prep: ? clue, no trich, + wbc KOH: no yeast PH: 5.5 (+blood)   ASSESSMENT Abnormal uterine bleeding, no abnormalities on ultrasound, expulsed her last IUD Not a candidate for OCP's. We discussed the option of POP, but she is continuing to have heavy bleeding with provera. We discussed the option of retrying the IUD (would place a mirena under ultrasound guidance). We discussed the option of endometrial ablation, but she is at increased risk of endometrial cancer and ablation could delay diagnosis. She is aware of the option of  hysterectomy, but would need to get her HgbA1C under better control. Given her diabetes and obesity, depo-provera isn't a good option.  Severe anemia, on BID iron Contraception (see options above)    PLAN She will increase her provera to BID, f/u on Thursday, as long as her lining is thin and her bleeding isn't excessive, will place another mirena IUD under ultrasound guidance Continue BID iron CBC, Ferritin today Setting her up for a consultation for an iron transfusion    An After Visit Summary was printed and given to the patient.  15 minutes face to face time of which over 50% was spent in counseling.

## 2016-09-23 ENCOUNTER — Other Ambulatory Visit: Payer: Self-pay | Admitting: Family Medicine

## 2016-09-23 DIAGNOSIS — Z760 Encounter for issue of repeat prescription: Secondary | ICD-10-CM

## 2016-09-23 LAB — WET PREP BY MOLECULAR PROBE
CANDIDA SPECIES: NOT DETECTED
GARDNERELLA VAGINALIS: NOT DETECTED
TRICHOMONAS VAG: NOT DETECTED

## 2016-09-23 LAB — FERRITIN: FERRITIN: 5 ng/mL — AB (ref 10–232)

## 2016-09-24 ENCOUNTER — Ambulatory Visit (INDEPENDENT_AMBULATORY_CARE_PROVIDER_SITE_OTHER): Payer: BLUE CROSS/BLUE SHIELD

## 2016-09-24 ENCOUNTER — Encounter: Payer: Self-pay | Admitting: Obstetrics and Gynecology

## 2016-09-24 ENCOUNTER — Other Ambulatory Visit: Payer: Self-pay | Admitting: Obstetrics and Gynecology

## 2016-09-24 ENCOUNTER — Ambulatory Visit (INDEPENDENT_AMBULATORY_CARE_PROVIDER_SITE_OTHER): Payer: BLUE CROSS/BLUE SHIELD | Admitting: Obstetrics and Gynecology

## 2016-09-24 VITALS — BP 110/70 | HR 92 | Resp 18 | Wt 293.0 lb

## 2016-09-24 DIAGNOSIS — Z6841 Body Mass Index (BMI) 40.0 and over, adult: Secondary | ICD-10-CM

## 2016-09-24 DIAGNOSIS — Z8669 Personal history of other diseases of the nervous system and sense organs: Secondary | ICD-10-CM

## 2016-09-24 DIAGNOSIS — Z538 Procedure and treatment not carried out for other reasons: Secondary | ICD-10-CM

## 2016-09-24 DIAGNOSIS — N921 Excessive and frequent menstruation with irregular cycle: Secondary | ICD-10-CM

## 2016-09-24 DIAGNOSIS — Z8679 Personal history of other diseases of the circulatory system: Secondary | ICD-10-CM | POA: Diagnosis not present

## 2016-09-24 DIAGNOSIS — Z8639 Personal history of other endocrine, nutritional and metabolic disease: Secondary | ICD-10-CM | POA: Diagnosis not present

## 2016-09-24 DIAGNOSIS — Z3009 Encounter for other general counseling and advice on contraception: Secondary | ICD-10-CM

## 2016-09-24 DIAGNOSIS — D500 Iron deficiency anemia secondary to blood loss (chronic): Secondary | ICD-10-CM | POA: Diagnosis not present

## 2016-09-24 NOTE — Telephone Encounter (Signed)
Spoke with patient regarding benefit for ultrasound and Mirena IUD insertion.   Patient understood and agreeable. Patient ready to schedule. Patient scheduled   09/24/16 with Dr Oscar La. Patient aware of date, arrival time and 48 hours   cancellation policy.

## 2016-09-24 NOTE — Progress Notes (Signed)
GYNECOLOGY  VISIT   HPI: 51 y.o.   Legally Separated  African American  female   [phone] with Patient's last menstrual period was 09/08/2016.   here for U/S guided IUD insertion  She has    GYNECOLOGIC HISTORY: Patient's last menstrual period was 09/08/2016. Contraception:none  Menopausal hormone therapy: none        OB History    Gravida Para Term Preterm AB Living   _0 SAB TAB Ectopic Multiple Live Births   1                 Patient Active Problem List   Diagnosis Date Noted  . Insulin dependent type 2 diabetes mellitus, uncontrolled ([place]) 12/19/2015  . Asthma, mild intermittent 12/19/2015  . Bilateral lower extremity edema 12/19/2015  . Rash and nonspecific skin eruption 10/19/2013  . DJD (degenerative joint disease) of knee 06/29/2013  . Unspecified constipation 05/08/2013  . Menorrhagia 03/30/2013  . Dysmenorrhea 03/30/2013  . Dyspnea 06/11/2010  . GRIEF REACTION 07/17/2009  . Edema 01/03/2009  . Morbid obesity ([place]) 12/21/2008  . Palpitations 12/21/2008  . GENITAL HERPES 03/22/2007  . HYPERLIPIDEMIA 03/22/2007  . Iron deficiency anemia 03/22/2007  . Anxiety disorder 03/22/2007  . DEPRESSION 03/22/2007  . Essential hypertension 03/22/2007  . ALLERGIC RHINITIS 03/22/2007  . [place] DISEASE, CERVICAL 03/22/2007  . POSITIVE PPD 03/22/2007    Past Medical History:  Diagnosis Date  . Allergic rhinitis   . Anemia   . Anxiety   . Asthma, mild intermittent 12/19/2015  . Depression   . Diabetes mellitus type 2 in obese ([place])   . Hyperlipidemia   . Hypertension   . Morbid obesity ([place])     Past Surgical History:  Procedure Laterality Date  . ABDOMINAL SURGERY     chronic abd wall abscess  . CESAREAN SECTION     x's 2  . CHOLECYSTECTOMY    . LIPOMA EXCISION  08/03/2011   Procedure: EXCISION LIPOMA;  Surgeon: Harl Bowie, MD;  Location: [facility];  Service: General;  Laterality: Right;  wide excision chronic abscess Right lower  abdominal wall  . OOPHORECTOMY     R [facility]      Current Outpatient Prescriptions  Medication Sig Dispense Refill  . albuterol (PROAIR HFA) 108 (90 Base) MCG/ACT inhaler Inhale 2 puffs into the lungs 4 (four) times daily. 18 g 3  . atorvastatin (LIPITOR) 10 MG tablet TAKE 1 TABLET BY MOUTH EVERY DAY 90 tablet 0  . BAYER MICROLET LANCETS lancets Use as instructed to check sugar up to 6 times daily 600 each 5  . betamethasone valerate ointment (VALISONE) 0.1 % Apply a pea sized amount topically BID for 1-2 weeks 30 g 0  . Blood Glucose Monitoring Suppl (BAYER CONTOUR MONITOR) w/Device KIT Use to check sugar daily 1 each 0  . canagliflozin (INVOKANA) 300 MG TABS tablet Take 1 tablet (300 mg total) by mouth daily. 90 tablet 3  . citalopram (CELEXA) 40 MG tablet Take 40 mg by mouth daily.    . clobetasol cream (TEMOVATE) 0.38 % Apply 1 application topically 2 (two) times daily. Up to 2 weeks at the time 45 g 1  . Famotidine (PEPCID AC PO) Take 1 tablet by mouth every 6 (six) hours as needed (heartburn).    . fluticasone (FLONASE) 50 MCG/ACT nasal spray PLACE 2 SPRAYS INTO BOTH NOSTRILS DAILY. 16 g 3  .  fluticasone (FLOVENT HFA) 44 MCG/ACT inhaler Inhale 1 puff into the lungs 2 (two) times daily as needed (shortness of breath).    Marland Kitchen glucose blood (BAYER CONTOUR TEST) test strip Use as instructed to check sugar 6 times daily 600 each 5  . hydroquinone 4 % cream APPLY TO AFFECTED AREA TWICE A DAY 28.35 g 2  . ibuprofen (ADVIL,MOTRIN) 600 MG tablet TAKE 1 TABLET BY MOUTH EVERY 8 HOURS AS NEEDED 60 tablet 0  . insulin aspart (NOVOLOG FLEXPEN) 100 UNIT/ML FlexPen INJECT 25 UNITS INTO THE SKIN 3 (THREE) TIMES DAILY 15 min before MEALS. 90 mL 1  . Insulin Glargine (TOUJEO SOLOSTAR) 300 UNIT/ML SOPN Inject 75 Units into the skin daily. 21 pen 1  . Insulin Pen Needle 31G X 5 MM MISC Use 4x a day 400 each 3  . medroxyPROGESTERone (PROVERA) 10 MG tablet Take 2 tablets po BID until  bleeding is very light or stops, then take 1 tablet a day for 2 weeks. 30 tablet 0  . metFORMIN (GLUCOPHAGE-XR) 500 MG 24 hr tablet Take 2 tablets (1,000 mg total) by mouth 2 (two) times daily. 360 tablet 3  . metoprolol succinate (TOPROL XL) 25 MG 24 hr tablet Take one tablet by mouth daily.( Take with 50 mg to total 75 mg daily.) 90 tablet 3  . metoprolol succinate (TOPROL-XL) 50 MG 24 hr tablet Take one tablet by mouth daily (Take with 25 mg tablet to total 75 mg daily) 90 tablet 3  . misoprostol (CYTOTEC) 200 MCG tablet Place 2 tablets vaginally 6-12 hours prior to IUD insertion 2 tablet 0  . Multiple Vitamins-Minerals (MULTIVITAMIN WITH MINERALS) tablet Take 1 tablet by mouth daily.    Marland Kitchen NOVOLOG FLEXPEN 100 UNIT/ML FlexPen INJECT 25 UNITS INTO THE SKIN 3 (THREE) TIMES DAILY WITH MEALS. 90 mL 1  . terconazole (TERAZOL 7) 0.4 % vaginal cream Place 1 applicator vaginally at bedtime. 45 g 0  . valsartan (DIOVAN) 320 MG tablet Take 160 mg by mouth daily.  1  . furosemide (LASIX) 20 MG tablet Take 1 tablet (20 mg total) by mouth 2 (two) times daily. 180 tablet 3   No current facility-administered medications for this visit.      ALLERGIES: Ramipril  Family History  Problem Relation Age of Onset  . Diabetes Mother   . Hypertension Mother   . Other Mother        renal failure  . Diabetes Other   . Hypertension Father   . Cancer Father     Social History   Social History  . Marital status: Legally Separated    Spouse name: N/A  . Number of children: N/A  . Years of education: N/A   Occupational History  . Not on file.   Social History Main Topics  . Smoking status: Never Smoker  . Smokeless tobacco: Never Used     Comment: Married  . Alcohol use No  . Drug use: No  . Sexual activity: Not Currently    Partners: Male    Birth control/ protection: None   Other Topics Concern  . Not on file   Social History Narrative  . No narrative on file    Review of Systems   Constitutional: Negative.   HENT: Negative.   Eyes: Negative.   Respiratory: Negative.   Cardiovascular: Negative.   Gastrointestinal: Negative.   Genitourinary: Negative.   Musculoskeletal: Negative.   Skin: Negative.   Neurological: Negative.   Endo/Heme/Allergies: Negative.   Psychiatric/Behavioral:  Negative.     PHYSICAL EXAMINATION:    BP 110/70 (BP Location: Right Wrist, Patient Position: Sitting, Cuff Size: Normal)   Pulse 92   Resp 18   Wt 293 lb (132.9 kg)   LMP 09/08/2016   BMI 48.76 kg/m      General appearance: alert, cooperative and appears stated age  Pelvic: External genitalia:  no lesions              Urethra:  normal appearing urethra with no masses, tenderness or lesions              Bartholins and Skenes: normal                 Vagina: normal appearing vagina with normal color and discharge, no lesions              Cervix: no lesions  The risks of the mirena IUD were reviewed with the patient, including infection, abnormal bleeding and uterine perfortion. Consent was signed.  A speculum was placed in the vagina, the cervix was cleansed with betadine. A tenaculum was placed on the cervix. The sound could not be placed into the cavity, the cervix was dilated to a #4 hagar dilator.  The patient was very uncomfortable and wanted to stop the procedure. The IUD was not placed. The tenaculum and speculum were removed.   Chaperone was present for exam.  Records from 08/14/09 surgery removed. She had an "attempted open laparoscopy, subsequent exploratory laparotomy with right oophorectomy, and umbilical hernia repair". She also had a D&C.  Pathology: right ovary-dermoid, EMC was benign.   ASSESSMENT Menometrorrhagia leading to anemia Contraception H/O diabetes, obesity, hypertension, elevated lipids, asthma and mild sleep apnea  Unable to tolerate office insertion of mirena IUD    PLAN Will proceed to the OR for hysteroscopy, D&C, mirena IUD  insertion Discussed risks, including: bleeding, infection, and uterine perforation She will need an Anesthesia consultation prior to surgery.    An After Visit Summary was printed and given to the patient.  15 minutes face to face time of which over 50% was spent in counseling.   CC: Dr Betty Martinique

## 2016-09-28 ENCOUNTER — Ambulatory Visit (INDEPENDENT_AMBULATORY_CARE_PROVIDER_SITE_OTHER): Payer: BLUE CROSS/BLUE SHIELD | Admitting: Orthopaedic Surgery

## 2016-09-28 ENCOUNTER — Ambulatory Visit (HOSPITAL_BASED_OUTPATIENT_CLINIC_OR_DEPARTMENT_OTHER): Payer: BLUE CROSS/BLUE SHIELD | Admitting: Family

## 2016-09-28 ENCOUNTER — Other Ambulatory Visit: Payer: Self-pay | Admitting: Family

## 2016-09-28 ENCOUNTER — Encounter (INDEPENDENT_AMBULATORY_CARE_PROVIDER_SITE_OTHER): Payer: Self-pay | Admitting: Orthopaedic Surgery

## 2016-09-28 ENCOUNTER — Other Ambulatory Visit (HOSPITAL_BASED_OUTPATIENT_CLINIC_OR_DEPARTMENT_OTHER): Payer: BLUE CROSS/BLUE SHIELD

## 2016-09-28 ENCOUNTER — Ambulatory Visit: Payer: BLUE CROSS/BLUE SHIELD

## 2016-09-28 ENCOUNTER — Ambulatory Visit (INDEPENDENT_AMBULATORY_CARE_PROVIDER_SITE_OTHER): Payer: BLUE CROSS/BLUE SHIELD

## 2016-09-28 VITALS — BP 133/48 | HR 87 | Temp 98.3°F | Resp 18 | Ht 65.0 in | Wt 294.5 lb

## 2016-09-28 DIAGNOSIS — D649 Anemia, unspecified: Secondary | ICD-10-CM | POA: Diagnosis not present

## 2016-09-28 DIAGNOSIS — M25562 Pain in left knee: Secondary | ICD-10-CM

## 2016-09-28 DIAGNOSIS — D500 Iron deficiency anemia secondary to blood loss (chronic): Secondary | ICD-10-CM

## 2016-09-28 DIAGNOSIS — N921 Excessive and frequent menstruation with irregular cycle: Secondary | ICD-10-CM

## 2016-09-28 DIAGNOSIS — E119 Type 2 diabetes mellitus without complications: Secondary | ICD-10-CM | POA: Diagnosis not present

## 2016-09-28 LAB — COMPREHENSIVE METABOLIC PANEL (CC13)
ALT: 10 IU/L (ref 0–32)
AST (SGOT): 14 IU/L (ref 0–40)
Albumin, Serum: 3.5 g/dL (ref 3.5–5.5)
Albumin/Globulin Ratio: 0.9 — ABNORMAL LOW (ref 1.2–2.2)
Alkaline Phosphatase, S: 97 IU/L (ref 39–117)
BUN / CREAT RATIO: 18 (ref 9–23)
BUN: 11 mg/dL (ref 6–24)
CHLORIDE: 105 mmol/L (ref 96–106)
Calcium, Ser: 9 mg/dL (ref 8.7–10.2)
Carbon Dioxide, Total: 21 mmol/L (ref 18–29)
Creatinine, Ser: 0.62 mg/dL (ref 0.57–1.00)
GFR calc non Af Amer: 107 mL/min/{1.73_m2} (ref >59)
GFR, EST AFRICAN AMERICAN: 123 mL/min/{1.73_m2} (ref >59)
GLOBULIN, TOTAL: 3.8 g/dL (ref 1.5–4.5)
Glucose: 88 mg/dL (ref 65–99)
POTASSIUM: 3.6 mmol/L (ref 3.5–5.2)
SODIUM: 134 mmol/L (ref 134–144)
TOTAL PROTEIN: 7.3 g/dL (ref 6.0–8.5)

## 2016-09-28 LAB — CBC WITH DIFFERENTIAL (CANCER CENTER ONLY)
BASO#: 0 10*3/uL (ref 0.0–0.2)
BASO%: 0.2 % (ref 0.0–2.0)
EOS%: 3.3 % (ref 0.0–7.0)
Eosinophils Absolute: 0.3 10*3/uL (ref 0.0–0.5)
HCT: 27.9 % — ABNORMAL LOW (ref 34.8–46.6)
HEMOGLOBIN: 8.1 g/dL — AB (ref 11.6–15.9)
LYMPH#: 3.6 10*3/uL — ABNORMAL HIGH (ref 0.9–3.3)
LYMPH%: 34.8 % (ref 14.0–48.0)
MCH: 23.1 pg — ABNORMAL LOW (ref 26.0–34.0)
MCHC: 29 g/dL — AB (ref 32.0–36.0)
MCV: 80 fL — ABNORMAL LOW (ref 81–101)
MONO#: 0.8 10*3/uL (ref 0.1–0.9)
MONO%: 7.8 % (ref 0.0–13.0)
NEUT%: 53.9 % (ref 39.6–80.0)
NEUTROS ABS: 5.6 10*3/uL (ref 1.5–6.5)
Platelets: 499 10*3/uL — ABNORMAL HIGH (ref 145–400)
RBC: 3.51 10*6/uL — ABNORMAL LOW (ref 3.70–5.32)
RDW: 16.7 % — ABNORMAL HIGH (ref 11.1–15.7)
WBC: 10.4 10*3/uL — AB (ref 3.9–10.0)

## 2016-09-28 LAB — CHCC SATELLITE - SMEAR

## 2016-09-28 LAB — RETICULOCYTES: RETICULOCYTE COUNT: 1.6 % (ref 0.6–2.6)

## 2016-09-28 NOTE — Progress Notes (Signed)
Office Visit Note   Patient: Heather Myers           Date of Birth: December 01, 1965           MRN: [id number] Visit Date: 09/28/2016              Requested by: [place], Betty G, MD [address] [place], [place] [address] PCP: [place], Betty G, MD   Assessment & Plan: Visit Diagnoses:  1. Acute pain of left knee     Plan: Impression is meniscus tear versus acute chondral injury. I recommended steroid injection to see if this will calm it down. If not better in about 4 weeks or so she should come back and would recommend MRI.  Follow-Up Instructions: Return if symptoms worsen or fail to improve.   Orders:  Orders Placed This Encounter  Procedures  . XR KNEE 3 VIEW LEFT   No orders of the defined types were placed in this encounter.     Procedures: No procedures performed   Clinical Data: No additional findings.   Subjective: Chief Complaint  Patient presents with  . Left Knee - Pain, New Patient (Initial Visit)    Patient is a 51 year old female comes in with a week and half of left knee pain. She states she was going downstairs when she heard and felt a pop. Since then she's had trouble with weightbearing. She is been treating this with ice and Tylenol and ibuprofen. She feels like there is things in her knee joint. She has severe pain with stiffness and giving way. The pain does not radiate.    Review of Systems  Constitutional: Negative.   HENT: Negative.   Eyes: Negative.   Respiratory: Negative.   Cardiovascular: Negative.   Endocrine: Negative.   Musculoskeletal: Negative.   Neurological: Negative.   Hematological: Negative.   Psychiatric/Behavioral: Negative.   All other systems reviewed and are negative.    Objective: Vital Signs: LMP 09/08/2016   Physical Exam  Constitutional: She is oriented to person, place, and time. She appears well-developed and well-nourished.  HENT:  Head: Normocephalic and atraumatic.  Eyes: EOM are normal.    Neck: Neck supple.  Pulmonary/Chest: Effort normal.  Abdominal: Soft.  Neurological: She is alert and oriented to person, place, and time.  Skin: Skin is warm. Capillary refill takes less than 2 seconds.  Psychiatric: She has a normal mood and affect. Her behavior is normal. Judgment and thought content normal.  Nursing note and vitals reviewed.   Ortho Exam Left knee exam shows a trace joint effusion. She actually has pretty good range of motion although she has a lot of anxiety with range of motion. She does not really have any joint line tenderness. Good collaterals and cruciates are stable. Negative McMurray's. Specialty Comments:  No specialty comments available.  Imaging: No results found.   PMFS History: Patient Active Problem List   Diagnosis Date Noted  . Insulin dependent type 2 diabetes mellitus, uncontrolled ([facility]) 12/19/2015  . Asthma, mild intermittent 12/19/2015  . Bilateral lower extremity edema 12/19/2015  . Rash and nonspecific skin eruption 10/19/2013  . DJD (degenerative joint disease) of knee 06/29/2013  . Unspecified constipation 05/08/2013  . Menorrhagia 03/30/2013  . Dysmenorrhea 03/30/2013  . Dyspnea 06/11/2010  . GRIEF REACTION 07/17/2009  . Edema 01/03/2009  . Morbid obesity ([facility]) 12/21/2008  . Palpitations 12/21/2008  . GENITAL HERPES 03/22/2007  . HYPERLIPIDEMIA 03/22/2007  . Iron deficiency anemia 03/22/2007  . Anxiety disorder 03/22/2007  .  DEPRESSION 03/22/2007  . Essential hypertension 03/22/2007  . ALLERGIC RHINITIS 03/22/2007  . [place] DISEASE, CERVICAL 03/22/2007  . 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 Allergic rhinitis   . Anemia   . Anxiety   . Asthma, mild intermittent 12/19/2015  . Depression   . Diabetes mellitus type 2 in obese ([facility])   . Hyperlipidemia   . Hypertension   . Morbid obesity ([facility])     Family History  Problem Relation Age of Onset  . Diabetes Mother   . Hypertension Mother   .  Other Mother        renal failure  . Diabetes Other   . Hypertension Father   . Cancer Father     Past Surgical History:  Procedure Laterality Date  . ABDOMINAL SURGERY     chronic abd wall abscess  . CESAREAN SECTION     x's 2  . CHOLECYSTECTOMY    . LIPOMA EXCISION  08/03/2011   Procedure: EXCISION LIPOMA;  Surgeon: Shelly Rubenstein, MD;  Location: [facility];  Service: General;  Laterality: Right;  wide excision chronic abscess Right lower abdominal wall  . OOPHORECTOMY     R ovary removed at [facility]  . RSO     Social History   Occupational History  . Not on file.   Social History Main Topics  . Smoking status: Never Smoker  . Smokeless tobacco: Never Used     Comment: Married  . Alcohol use No  . Drug use: No  . Sexual activity: Not Currently    Partners: Male    Birth control/ protection: None

## 2016-09-28 NOTE — Progress Notes (Signed)
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 is a very pleasant 51 yo African American female with history of iron deficiency anemia secondary to menometrorrhagia. Her cycles have been heavy and irregular for several years now but seemed to have gotten worse since November of last year.  She had a IUD placed in 2014 that was expelled recently and she has decided to have a D&C and another IUD placed. Her gynecologist tried to do this in the office but she was unable to tolerate and is rescheduling.  She has tried taking an oral iron supplement without any improvement. Her ferritin is 5.  Hgb is 8.1 with an MCV of 80.  Her cycle has now been on for 3 weeks and she states that she has large clots and a lot of abdominal cramping.  She was started on Cytotec and Provera but feels that her cycle is heavier.  Anemia due to menometrorrhagia runs on her mothers side of the family. Both her mother and grandmother both had hysterectomies in their late 20's and early 30's due to hemorrhage. Her daughter also has iron deficiency anemia.  She has had no other episodes of bleed, no bruising or petechiae.  She has had several surgeries in the past including 2 C-sections, cholecystectomy and torn meniscus in the right knee repaired with no problem with bleeding.  No sickle cell disease or trait.  She is symptomatic with fatigue, weakness, chills, insomnia and occasional palpitations. No fever, n/v, cough, rash, dizziness, SOB, chest pain or changes in bowel or bladder habits.   She has maintained a good appetite and is staying well hydrated. Her weight is stable.  She is diabetic and states that her  blood sugars are fairly well controlled. Her last Hgb A1c was 7.4.  She has some numbness and tinging in her hands that comes and goes. No swelling in her extremities at this time. Her left knee is tender. She stepped wrong a week or so ago and she feels that she has possibly torn her meniscus. She is wearing a knee brace and has an appointment later this afternoon with her orthopedist.  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  Manual BP was 112/60.   ROS: All other 10 point review of systems is negative.   PAST MEDICAL HISTORY:   Past Medical History:  Diagnosis Date  . Allergic rhinitis   . Anemia   . Anxiety   . Asthma, mild intermittent 12/19/2015  . Depression   . Diabetes mellitus type 2 in obese ([place])   . Hyperlipidemia   . Hypertension   . Morbid obesity (HCC)     ALLERGIES: Allergies  Allergen Reactions  . Ramipril Shortness Of Breath      MEDICATIONS:  Current Outpatient Prescriptions on File Prior to Visit  Medication Sig Dispense Refill  . albuterol (PROAIR HFA) 108 (90 Base) MCG/ACT inhaler Inhale 2 puffs into the lungs 4 (four) times daily. 18 g 3  . atorvastatin (LIPITOR) 10 MG tablet TAKE 1 TABLET BY MOUTH EVERY DAY 90 tablet 0  . BAYER MICROLET LANCETS lancets Use as instructed to check sugar up to 6 times daily 600 each 5  .  betamethasone valerate ointment (VALISONE) 0.1 % Apply a pea sized amount topically BID for 1-2 weeks 30 g 0  . Blood Glucose Monitoring Suppl (BAYER CONTOUR MONITOR) w/Device KIT Use to check sugar daily 1 each 0  . canagliflozin (INVOKANA) 300 MG TABS tablet Take 1 tablet (300 mg total) by mouth daily. 90 tablet 3  . citalopram (CELEXA) 40 MG tablet Take 40 mg by mouth daily.    . clobetasol cream (TEMOVATE) 4.12 % Apply 1 application topically 2 (two) times daily. Up to 2 weeks at the time 45 g 1  . Famotidine (PEPCID AC PO) Take 1 tablet by mouth every 6 (six) hours as needed (heartburn).    . fluticasone (FLONASE) 50 MCG/ACT  nasal spray PLACE 2 SPRAYS INTO BOTH NOSTRILS DAILY. 16 g 3  . fluticasone (FLOVENT HFA) 44 MCG/ACT inhaler Inhale 1 puff into the lungs 2 (two) times daily as needed (shortness of breath).    . furosemide (LASIX) 20 MG tablet Take 1 tablet (20 mg total) by mouth 2 (two) times daily. 180 tablet 3  . glucose blood (BAYER CONTOUR TEST) test strip Use as instructed to check sugar 6 times daily 600 each 5  . hydroquinone 4 % cream APPLY TO AFFECTED AREA TWICE A DAY 28.35 g 2  . ibuprofen (ADVIL,MOTRIN) 600 MG tablet TAKE 1 TABLET BY MOUTH EVERY 8 HOURS AS NEEDED 60 tablet 0  . insulin aspart (NOVOLOG FLEXPEN) 100 UNIT/ML FlexPen INJECT 25 UNITS INTO THE SKIN 3 (THREE) TIMES DAILY 15 min before MEALS. 90 mL 1  . Insulin Glargine (TOUJEO SOLOSTAR) 300 UNIT/ML SOPN Inject 75 Units into the skin daily. 21 pen 1  . Insulin Pen Needle 31G X 5 MM MISC Use 4x a day 400 each 3  . medroxyPROGESTERone (PROVERA) 10 MG tablet Take 2 tablets po BID until bleeding is very light or stops, then take 1 tablet a day for 2 weeks. 30 tablet 0  . metFORMIN (GLUCOPHAGE-XR) 500 MG 24 hr tablet Take 2 tablets (1,000 mg total) by mouth 2 (two) times daily. 360 tablet 3  . metoprolol succinate (TOPROL XL) 25 MG 24 hr tablet Take one tablet by mouth daily.( Take with 50 mg to total 75 mg daily.) 90 tablet 3  . metoprolol succinate (TOPROL-XL) 50 MG 24 hr tablet Take one tablet by mouth daily (Take with 25 mg tablet to total 75 mg daily) 90 tablet 3  . misoprostol (CYTOTEC) 200 MCG tablet Place 2 tablets vaginally 6-12 hours prior to IUD insertion 2 tablet 0  . Multiple Vitamins-Minerals (MULTIVITAMIN WITH MINERALS) tablet Take 1 tablet by mouth daily.    Marland Kitchen NOVOLOG FLEXPEN 100 UNIT/ML FlexPen INJECT 25 UNITS INTO THE SKIN 3 (THREE) TIMES DAILY WITH MEALS. 90 mL 1  . terconazole (TERAZOL 7) 0.4 % vaginal cream Place 1 applicator vaginally at bedtime. 45 g 0  . valsartan (DIOVAN) 320 MG tablet Take 160 mg by mouth daily.  1   No  current facility-administered medications on file prior to visit.      PAST SURGICAL HISTORY Past Surgical History:  Procedure Laterality Date  . ABDOMINAL SURGERY     chronic abd wall abscess  . CESAREAN SECTION     x's 2  . CHOLECYSTECTOMY    . LIPOMA EXCISION  08/03/2011   Procedure: EXCISION LIPOMA;  Surgeon: Harl Bowie, MD;  Location: [facility];  Service: General;  Laterality: Right;  wide excision chronic abscess Right lower abdominal wall  .  OOPHORECTOMY     R ovary removed at [place]: Family History  Problem Relation Age of Onset  . Diabetes Mother   . Hypertension Mother   . Other Mother        renal failure  . Diabetes Other   . Hypertension Father   . Cancer Father     SOCIAL HISTORY:  reports that she has never smoked. She has never used smokeless tobacco. She reports that she does not drink alcohol or use drugs.  PERFORMANCE STATUS: The patient's performance status is 1 - Symptomatic but completely ambulatory  PHYSICAL EXAM: Most Recent Vital Signs: Last menstrual period 09/08/2016. LMP 09/08/2016   General Appearance:    Alert, cooperative, no distress, appears stated age  Head:    Normocephalic, without obvious abnormality, atraumatic  Eyes:    PERRL, conjunctiva/corneas clear, EOM's intact, fundi    benign, both eyes        Throat:   Lips, mucosa, and tongue normal; teeth and gums normal  Neck:   Supple, symmetrical, trachea midline, no adenopathy;    thyroid:  no enlargement/tenderness/nodules; no carotid   bruit or JVD  Back:     Symmetric, no curvature, ROM normal, no CVA tenderness  Lungs:     Clear to auscultation bilaterally, respirations unlabored  Chest Wall:    No tenderness or deformity   Heart:    Regular rate and rhythm, S1 and S2 normal, no murmur, rub   or gallop     Abdomen:     Soft, non-tender, bowel sounds active all four quadrants,    no masses, no organomegaly         Extremities:   Upper extremities and right lower extremity normal, left knee tender and in brace, no cyanosis or edema  Pulses:   2+ and symmetric all extremities  Skin:   Skin color, texture, turgor normal, no rashes or lesions  Lymph nodes:   Cervical, supraclavicular, and axillary nodes normal  Neurologic:   CNII-XII intact, normal strength, sensation and reflexes    throughout    LABORATORY DATA:  Results for orders placed or performed in visit on 09/28/16 (from the past 48 hour(s))  CBC w/Diff     Status: Abnormal   Collection Time: 09/28/16  1:37 PM  Result Value Ref Range   WBC 10.4 (H) 3.9 - 10.0 10e3/uL   RBC 3.51 (L) 3.70 - 5.32 10e6/uL   HGB 8.1 (L) 11.6 - 15.9 g/dL   HCT 27.9 (L) 34.8 - 46.6 %   MCV 80 (L) 81 - 101 fL   MCH 23.1 (L) 26.0 - 34.0 pg   MCHC 29.0 (L) 32.0 - 36.0 g/dL   RDW 16.7 (H) 11.1 - 15.7 %   Platelets 499 (H) 145 - 400 10e3/uL   NEUT# 5.6 1.5 - 6.5 10e3/uL   LYMPH# 3.6 (H) 0.9 - 3.3 10e3/uL   MONO# 0.8 0.1 - 0.9 10e3/uL   Eosinophils Absolute 0.3 0.0 - 0.5 10e3/uL   BASO# 0.0 0.0 - 0.2 10e3/uL   NEUT% 53.9 39.6 - 80.0 %   LYMPH% 34.8 14.0 - 48.0 %   MONO% 7.8 0.0 - 13.0 %   EOS% 3.3 0.0 - 7.0 %   BASO% 0.2 0.0 - 2.0 %  Smear     Status: None   Collection Time: 09/28/16  1:37 PM  Result Value Ref Range   Smear Result Smear Available  RADIOGRAPHY: No results found.     PATHOLOGY: None  ASSESSMENT/PLAN: [name] is a very pleasant 51 yo African American female with history of iron deficiency anemia secondary to menometrorrhagia. She is symptomatic with her ferritin of 5. Hgb is 8.1 with an MCV of 80. She has now been on her cycle for 3 weeks.  We will plan to give her one dose of IV iron this week and a second dose next week.  We will then plan to see her back in 6 weeks for repeat lab work and follow-up.  She will also be rescheduling her D&C and IUD placement with gynecology.   All questions were answered. She will contact our  office with any questions or concerns. We can certainly see her sooner if necessary.  She was discussed with and also seen by Dr. Marin Olp and he is in agreement with the aforementioned.   [facility] M     Addendum:  I agree with the above noted by Judson Roch. We saw and examined the patient. I totally agree with what she has recommended.  Her iron studies clearly show marked iron deficiency. Her iron saturation is only 3%.  She deftly will benefit from IV iron. This will make her feel better.  His health she may need left knee surgery. I want to make sure that her iron studies are adequate as this will help her anemia.  She is quite anemic.  I looked at her blood I do the microscope. I did not see anything that looked suspicious. I do not see anything that looked like myelodysplasia or a myeloproliferative disorder.  I don't think we have to worry about doing a bone marrow test on her.  As he talked her about a gastric bypass. I think this will clearly help her with her weight and also with her diabetes. She is quite worried about having a gastric bypass.  We will go ahead and give her 2 doses of iron. I'm sure she will likely need more than 2 doses.  I'm sure that she will feel a whole lot better. 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  We spent about 40 minutes with her. We answered all of her questions.  Lattie Haw, MD

## 2016-09-29 ENCOUNTER — Ambulatory Visit: Payer: BLUE CROSS/BLUE SHIELD

## 2016-09-29 LAB — IRON AND TIBC
%SAT: 3 % — ABNORMAL LOW (ref 21–57)
Iron: 11 ug/dL — ABNORMAL LOW (ref 41–142)
TIBC: 396 ug/dL (ref 236–444)
UIBC: 385 ug/dL — AB (ref 120–384)

## 2016-09-29 LAB — ERYTHROPOIETIN: ERYTHROPOIETIN: 445 m[IU]/mL — AB (ref 2.6–18.5)

## 2016-09-30 ENCOUNTER — Ambulatory Visit (HOSPITAL_BASED_OUTPATIENT_CLINIC_OR_DEPARTMENT_OTHER): Payer: BLUE CROSS/BLUE SHIELD

## 2016-09-30 ENCOUNTER — Telehealth: Payer: Self-pay | Admitting: *Deleted

## 2016-09-30 ENCOUNTER — Other Ambulatory Visit: Payer: Self-pay | Admitting: Family Medicine

## 2016-09-30 VITALS — BP 125/48 | HR 83 | Temp 98.2°F | Resp 18

## 2016-09-30 DIAGNOSIS — K921 Melena: Secondary | ICD-10-CM

## 2016-09-30 DIAGNOSIS — Z794 Long term (current) use of insulin: Principal | ICD-10-CM

## 2016-09-30 DIAGNOSIS — D500 Iron deficiency anemia secondary to blood loss (chronic): Secondary | ICD-10-CM

## 2016-09-30 DIAGNOSIS — E1165 Type 2 diabetes mellitus with hyperglycemia: Secondary | ICD-10-CM

## 2016-09-30 DIAGNOSIS — IMO0002 Reserved for concepts with insufficient information to code with codable children: Secondary | ICD-10-CM

## 2016-09-30 MED ORDER — SODIUM CHLORIDE 0.9 % IV SOLN
Freq: Once | INTRAVENOUS | Status: AC
  Administered 2016-09-30: 15:00:00 via INTRAVENOUS

## 2016-09-30 MED ORDER — SODIUM CHLORIDE 0.9 % IV SOLN
510.0000 mg | Freq: Once | INTRAVENOUS | Status: AC
  Administered 2016-09-30: 510 mg via INTRAVENOUS
  Filled 2016-09-30: qty 17

## 2016-09-30 NOTE — Patient Instructions (Signed)

## 2016-09-30 NOTE — Telephone Encounter (Signed)
Call to patient regarding IUD coverage for surgery. Advised may receive call from speciality pharmacy.  Patient very anxious to proceed as soon as possible.

## 2016-10-05 MED ORDER — LEVONORGESTREL 20 MCG/24HR IU IUD: 1.0000 | INTRAUTERINE_SYSTEM | Freq: Once | INTRAUTERINE | 0 refills | Status: DC

## 2016-10-05 NOTE — Telephone Encounter (Signed)
Call to patient to provide update on IUD thru mail order pharmacy Order was faxed to [facility] on 10-01-16.  Phone call to pharmacy today, hold time today for 1 hour and 20 mins before selecting option 1 to leave message for call back. Then message states voice mail is full and call disconnects. Rx for IUD sent electronically in attempt to obtain [facility] IUD. Left message on patients voice mail. Per DPR can leave message and voice mail has number confirmation.  Update provided that have been unsuccessful in obtaining IUD thus far. Left message to call back.

## 2016-10-06 ENCOUNTER — Ambulatory Visit: Payer: BLUE CROSS/BLUE SHIELD

## 2016-10-08 ENCOUNTER — Other Ambulatory Visit: Payer: Self-pay | Admitting: Family Medicine

## 2016-10-08 ENCOUNTER — Ambulatory Visit (HOSPITAL_BASED_OUTPATIENT_CLINIC_OR_DEPARTMENT_OTHER): Payer: BLUE CROSS/BLUE SHIELD

## 2016-10-08 VITALS — BP 103/40 | HR 88 | Temp 98.1°F | Resp 18

## 2016-10-08 DIAGNOSIS — N921 Excessive and frequent menstruation with irregular cycle: Secondary | ICD-10-CM | POA: Diagnosis not present

## 2016-10-08 DIAGNOSIS — I10 Essential (primary) hypertension: Secondary | ICD-10-CM

## 2016-10-08 DIAGNOSIS — D500 Iron deficiency anemia secondary to blood loss (chronic): Secondary | ICD-10-CM

## 2016-10-08 MED ORDER — SODIUM CHLORIDE 0.9 % IV SOLN
Freq: Once | INTRAVENOUS | Status: AC
  Administered 2016-10-08: 15:00:00 via INTRAVENOUS

## 2016-10-08 MED ORDER — FERUMOXYTOL INJECTION 510 MG/17 ML
510.0000 mg | Freq: Once | INTRAVENOUS | Status: AC
Start: 2016-10-08 — End: 2016-10-08
  Administered 2016-10-08: 510 mg via INTRAVENOUS
  Filled 2016-10-08: qty 17

## 2016-10-09 ENCOUNTER — Telehealth: Payer: Self-pay | Admitting: Family Medicine

## 2016-10-09 DIAGNOSIS — F419 Anxiety disorder, unspecified: Secondary | ICD-10-CM

## 2016-10-09 NOTE — Telephone Encounter (Signed)
I have seen Ms Heather Myers lately for acute visits. She has not followed on depression or anxiety with me. I sent Rx for Celexa to her pharmacy, 3 months f/u.  Thanks, BJ

## 2016-10-09 NOTE — Telephone Encounter (Signed)
Can we get her scheduled for a 30 minute 3 month follow up?  Thank you!

## 2016-10-09 NOTE — Telephone Encounter (Signed)
Pt states she is driving and cannot make an appt at this time. Will call back Monday.

## 2016-10-14 ENCOUNTER — Ambulatory Visit (INDEPENDENT_AMBULATORY_CARE_PROVIDER_SITE_OTHER): Payer: BLUE CROSS/BLUE SHIELD | Admitting: Orthopaedic Surgery

## 2016-10-14 ENCOUNTER — Encounter (INDEPENDENT_AMBULATORY_CARE_PROVIDER_SITE_OTHER): Payer: Self-pay | Admitting: Orthopaedic Surgery

## 2016-10-14 DIAGNOSIS — M25562 Pain in left knee: Secondary | ICD-10-CM | POA: Insufficient documentation

## 2016-10-14 NOTE — Addendum Note (Signed)
Addended by: Rogers Seeds on: 10/14/2016 05:29 PM   Modules accepted: Orders

## 2016-10-14 NOTE — Progress Notes (Signed)
The patient is very pleasant 51 year old female who is a former patient of mine. She also saw one of my partners recently she has acute injury to her left knee. She has tried appropriate conservative management including activity modification, anti-inflammatories, rest, ice, heat, and knee brace, and a intra-articular steroid injection. She's work on [employer] account pl[employer] well. Her pain is daily and now it is detrimentally affected her activity is daily living, her quality of life, and her mobility. She has locking catching in her knee and severe pain past 90 of flexion. Her pain was 10 out of 10 before the injection but still 6 out of 10 now.  On examination she is a positive McMurray's exam to the medial aspect of her knee. She has pain in the back of her knee with extreme of flexion. There is a mild effusion as well.  At this point given her clinical exam findings as well as the failure of all forms of conservative treatment and MRI is warranted to rule out a meniscal tear. All questions were encouraged and answered. She'll continue the knee brace and continue being careful with her knee while we await the MRI results.

## 2016-10-26 NOTE — Telephone Encounter (Signed)
Follow-up call to patient. Unable to leave message, voice mail box is full. Per notes in EPIC, had hematology infusion appointments on 09-30-16 and 10-08-16.

## 2016-10-27 ENCOUNTER — Ambulatory Visit (INDEPENDENT_AMBULATORY_CARE_PROVIDER_SITE_OTHER): Payer: BLUE CROSS/BLUE SHIELD | Admitting: Orthopaedic Surgery

## 2016-10-30 ENCOUNTER — Other Ambulatory Visit: Payer: Self-pay | Admitting: Family Medicine

## 2016-10-30 DIAGNOSIS — F419 Anxiety disorder, unspecified: Secondary | ICD-10-CM

## 2016-11-03 ENCOUNTER — Ambulatory Visit
Admission: RE | Admit: 2016-11-03 | Discharge: 2016-11-03 | Disposition: A | Discharge status: Home / Self Care | Payer: BLUE CROSS/BLUE SHIELD | Source: Ambulatory Visit | Attending: Orthopaedic Surgery | Admitting: Orthopaedic Surgery

## 2016-11-03 DIAGNOSIS — M25562 Pain in left knee: Secondary | ICD-10-CM

## 2016-11-04 ENCOUNTER — Ambulatory Visit
Admission: RE | Admit: 2016-11-04 | Discharge: 2016-11-04 | Disposition: A | Discharge status: Home / Self Care | Payer: BLUE CROSS/BLUE SHIELD | Source: Ambulatory Visit | Attending: Orthopaedic Surgery | Admitting: Orthopaedic Surgery

## 2016-11-04 DIAGNOSIS — M7122 Synovial cyst of popliteal space [Baker], left knee: Secondary | ICD-10-CM | POA: Diagnosis not present

## 2016-11-05 ENCOUNTER — Ambulatory Visit (INDEPENDENT_AMBULATORY_CARE_PROVIDER_SITE_OTHER): Payer: BLUE CROSS/BLUE SHIELD | Admitting: Orthopaedic Surgery

## 2016-11-05 ENCOUNTER — Telehealth: Payer: Self-pay | Admitting: Obstetrics and Gynecology

## 2016-11-05 DIAGNOSIS — M25562 Pain in left knee: Secondary | ICD-10-CM

## 2016-11-05 DIAGNOSIS — M1712 Unilateral primary osteoarthritis, left knee: Secondary | ICD-10-CM | POA: Diagnosis not present

## 2016-11-05 MED ORDER — ACETAMINOPHEN-CODEINE #3 300-30 MG PO TABS: 1.0000 | ORAL_TABLET | Freq: Three times a day (TID) | ORAL | 0 refills | Status: DC | PRN

## 2016-11-05 MED ORDER — LIDOCAINE HCL 1 % IJ SOLN
3.0000 mL | INTRAMUSCULAR | Status: AC | PRN
  Administered 2016-11-05: 3 mL

## 2016-11-05 MED ORDER — METHYLPREDNISOLONE ACETATE 40 MG/ML IJ SUSP
40.0000 mg | INTRAMUSCULAR | Status: AC | PRN
  Administered 2016-11-05: 40 mg via INTRA_ARTICULAR

## 2016-11-05 NOTE — Progress Notes (Signed)
Office Visit Note   Patient: Heather Myers           Date of Birth: 07/23/1965           MRN: [id number] Visit Date: 11/05/2016              Requested by: [place], Betty G, MD [address] [place], [place] [address] PCP: [place], Betty G, MD   Assessment & Plan: Visit Diagnoses:  1. Unilateral primary osteoarthritis, left knee   2. Acute pain of left knee     Plan:We talked about the MRI findings. She does have significant cartilage loss of patellofemoral joint lateral aspect of her knee. The meniscal tears just a small tear and is not complete. She would like to try to stay as conservative as possible. I agree with this as well. We tried one more steroid shot her knee today and we'll now order hyaluronic acid to treat the moderate arthritis of her knee. I don't Synvisc 1. We see her back in 4 weeks we'll place Synvisc 1 in her knee. All questions were encouraged and answered.  Follow-Up Instructions: Return in about 4 weeks (around 12/03/2016).   Orders:  No orders of the defined types were placed in this encounter.  No orders of the defined types were placed in this encounter.     Procedures: Large Joint Inj Date/Time: 11/05/2016 2:41 PM Performed by: Kathryne Hitch Authorized by: Kathryne Hitch   Location:  Knee Site:  L knee Ultrasound Guidance: No   Fluoroscopic Guidance: No   Arthrogram: No   Medications:  3 mL lidocaine 1 %; 40 mg methylPREDNISolone acetate 40 MG/ML     Clinical Data: No additional findings.   Subjective: No chief complaint on file. The patient is following up after MRI of her left knee. We obtain this MRI due to the severity of the pain she was having after coming down stairs and felt a significant pop in her knee. We try to steroid injection does not help. She is actually leaving for a plane flight on Monday and is been on aspirin daily for peripheral cross-country flight. Her knee is still giving her a lot of problem  is. It is having pain in the back of her knee. This is a burning type sensation as well.  HPI  Review of Systems She denies any fever, chills, nausea, vomiting  Objective: Vital Signs: There were no vitals taken for this visit.  Physical Exam She is alert or 3 in no acute distress Ortho Exam Examination of her left knee she has pain mainly the lateral aspect of her knee and patellofemoral joint. She does have posterior pain. I cannot palpate any cords. Specialty Comments:  No specialty comments available.  Imaging: Mr Knee Left W/o Contrast  Result Date: 11/04/2016 CLINICAL DATA:  Left knee pain and popping and swelling. EXAM: MRI OF THE LEFT KNEE WITHOUT CONTRAST TECHNIQUE: Multiplanar, multisequence MR imaging of the knee was performed. No intravenous contrast was administered. COMPARISON:  None. FINDINGS: MENISCI Medial meniscus:  Intact. Lateral meniscus: There is an almost complete radial tear of the posterior horn at the posterolateral corner with secondary peripheral subluxation of the midbody of the meniscus. LIGAMENTS Cruciates:  Intact ACL and PCL. Collaterals: Medial collateral ligament is intact. Lateral collateral ligament complex is intact. CARTILAGE Patellofemoral: Diffuse denuding of the articular cartilage of the apex and lateral facet of the patella. Thinning of the articular cartilage of the lateral aspect of the trochlear groove.  Medial: Small central fissure in the cartilage of the femoral condyle. Lateral: Irregular 10 mm area of full-thickness cartilage loss of the central portion of the femoral condyle. Thinning of the articular cartilage of the posterior aspect of the tibial plateau. Joint:  Small joint effusion with synovial hypertrophy. Popliteal Fossa:  Complex 5 cm Baker's cyst. Extensor Mechanism:  Intact quadriceps tendon and patellar tendon. Bones:  Prominent tricompartmental marginal osteophytes. Other: None IMPRESSION: 1. Almost complete radial tear of the  posterior horn of the lateral meniscus at the posterolateral corner. 2. Osteoarthritic changes of all 3 compartments, with full-thickness cartilage loss most prominently in the patellofemoral and lateral compartments. 3. Complex Baker's cyst. Electronically Signed   By: Francene Boyers M.D.   On: 11/04/2016 15:32     PMFS History: Patient Active Problem List   Diagnosis Date Noted  . Unilateral primary osteoarthritis, left knee 11/05/2016  . Acute pain of left knee 10/14/2016  . Insulin dependent type 2 diabetes mellitus, uncontrolled (HCC) 12/19/2015  . Asthma, mild intermittent 12/19/2015  . Bilateral lower extremity edema 12/19/2015  . Rash and nonspecific skin eruption 10/19/2013  . DJD (degenerative joint disease) of knee 06/29/2013  . Unspecified constipation 05/08/2013  . Menorrhagia 03/30/2013  . Dysmenorrhea 03/30/2013  . Dyspnea 06/11/2010  . GRIEF REACTION 07/17/2009  . Edema 01/03/2009  . Morbid obesity (HCC) 12/21/2008  . Palpitations 12/21/2008  . GENITAL HERPES 03/22/2007  . HYPERLIPIDEMIA 03/22/2007  . Iron deficiency anemia 03/22/2007  . Anxiety disorder 03/22/2007  . DEPRESSION 03/22/2007  . Essential hypertension 03/22/2007  . ALLERGIC RHINITIS 03/22/2007  . DISC DISEASE, CERVICAL 03/22/2007  . 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 Allergic rhinitis   . Anemia   . Anxiety   . Asthma, mild intermittent 12/19/2015  . Depression   . Diabetes mellitus type 2 in obese (HCC)   . Hyperlipidemia   . Hypertension   . Morbid obesity (HCC)     Family History  Problem Relation Age of Onset  . Diabetes Mother   . Hypertension Mother   . Other Mother        renal failure  . Diabetes Other   . Hypertension Father   . Cancer Father     Past Surgical History:  Procedure Laterality Date  . ABDOMINAL SURGERY     chronic abd wall abscess  . CESAREAN SECTION     x's 2  . CHOLECYSTECTOMY    . LIPOMA EXCISION  08/03/2011   Procedure:  EXCISION LIPOMA;  Surgeon: Shelly Rubenstein, MD;  Location: [facility];  Service: General;  Laterality: Right;  wide excision chronic abscess Right lower abdominal wall  . OOPHORECTOMY     R ovary [facility] at [facility]  . RSO     Social History   Occupational History  . Not on file.   Social History Main Topics  . Smoking status: Never Smoker  . Smokeless tobacco: Never Used     Comment: Married  . Alcohol use No  . Drug use: No  . Sexual activity: Not Currently    Partners: Male    Birth control/ protection: None

## 2016-11-05 NOTE — Telephone Encounter (Signed)
Lupita Leash from [employer] called requesting to coordinate shipment of a Mirena for this patient.

## 2016-11-06 ENCOUNTER — Telehealth: Payer: Self-pay | Admitting: *Deleted

## 2016-11-06 NOTE — Telephone Encounter (Signed)
Call to patient regarding plans for surgery and IUD insertion. Patient states she is feeling better following her iron transfusions.  Menses started 11-03-16 and is still just as bad. Ready to proceed with scheduling surgery but will be on vacation next week.  Will schedule and call patient back.

## 2016-11-06 NOTE — Telephone Encounter (Signed)
Spoke with Rob at [facility]. Confirmed office address for delivery. Mirena will be delivered via [employer] with overnight shipping on 11/10/2016.  Cc; Billie Ruddy, RN  Routing to provider for final review. Patient agreeable to disposition. Will close encounter.

## 2016-11-06 NOTE — Telephone Encounter (Signed)
See next phone encounters.  Routing to provider for final review. Patient agreeable to disposition. Will close encounter   

## 2016-11-06 NOTE — Telephone Encounter (Signed)
Surgery scheduled for Tuesday, 12-08-16 at 1200 at [facility]. Call to patient. Left message to call back.

## 2016-11-06 NOTE — Telephone Encounter (Signed)
Call to [facility]. Left message for return call to the office as they are on pacific time and are not yet open for the day.

## 2016-11-07 ENCOUNTER — Other Ambulatory Visit: Payer: Self-pay | Admitting: Family Medicine

## 2016-11-07 DIAGNOSIS — IMO0002 Reserved for concepts with insufficient information to code with codable children: Secondary | ICD-10-CM

## 2016-11-07 DIAGNOSIS — Z30430 Encounter for insertion of intrauterine contraceptive device: Secondary | ICD-10-CM | POA: Diagnosis not present

## 2016-11-07 DIAGNOSIS — Z794 Long term (current) use of insulin: Principal | ICD-10-CM

## 2016-11-07 DIAGNOSIS — E1165 Type 2 diabetes mellitus with hyperglycemia: Secondary | ICD-10-CM

## 2016-11-09 ENCOUNTER — Other Ambulatory Visit: Payer: Self-pay | Admitting: Internal Medicine

## 2016-11-09 DIAGNOSIS — E1165 Type 2 diabetes mellitus with hyperglycemia: Secondary | ICD-10-CM

## 2016-11-09 DIAGNOSIS — Z794 Long term (current) use of insulin: Principal | ICD-10-CM

## 2016-11-09 DIAGNOSIS — IMO0002 Reserved for concepts with insufficient information to code with codable children: Secondary | ICD-10-CM

## 2016-11-09 NOTE — Telephone Encounter (Signed)
Denied.

## 2016-11-09 NOTE — Telephone Encounter (Signed)
No, she was lost for f/u 

## 2016-11-09 NOTE — Telephone Encounter (Signed)
Last seen 05/03/15, no other appointments. Okay to refill? Thanks!

## 2016-11-17 NOTE — Telephone Encounter (Signed)
Follow-up call to patient. Left message to call back. Need to review surgery instructions and schedule surgery consult.

## 2016-11-17 NOTE — Telephone Encounter (Signed)
Call to patient. Confirmed surgery date and time for Tuesday 12-08-16 at 1200 at [facility]. Plan arrival at 1030 unless hospital instructs otherwise. Consult scheduled for Dr Oscar La at 11-23-16 at 1pm. Will review surgery instructions at that time.   Routing to provider for final review. Patient agreeable to disposition. Will close encounter.

## 2016-11-17 NOTE — Telephone Encounter (Signed)
Patient returned call

## 2016-11-18 ENCOUNTER — Ambulatory Visit: Payer: BLUE CROSS/BLUE SHIELD | Admitting: Family

## 2016-11-18 ENCOUNTER — Other Ambulatory Visit: Payer: BLUE CROSS/BLUE SHIELD

## 2016-11-23 ENCOUNTER — Telehealth: Payer: Self-pay | Admitting: Obstetrics and Gynecology

## 2016-11-23 ENCOUNTER — Ambulatory Visit: Payer: BLUE CROSS/BLUE SHIELD | Admitting: Obstetrics and Gynecology

## 2016-11-23 ENCOUNTER — Encounter: Payer: Self-pay | Admitting: Obstetrics and Gynecology

## 2016-11-23 NOTE — Telephone Encounter (Signed)
Patient dnka her surgery consult appointment today. I left a message for her to call and reschedule. [facility] policy followed.

## 2016-11-24 ENCOUNTER — Telehealth: Payer: Self-pay | Admitting: Internal Medicine

## 2016-11-24 NOTE — Telephone Encounter (Signed)
Okay to refill since patient made appointment? Thank you!

## 2016-11-24 NOTE — Telephone Encounter (Signed)
OK 

## 2016-11-24 NOTE — Telephone Encounter (Signed)
Left another message advising patient that we need to see her this week prior to her surgery or we would need to cancel her surgery.

## 2016-11-24 NOTE — Telephone Encounter (Signed)
Patient scheduled for 30 min OV w/ Gherghe on Tuesday August 21st at 11:15am.  **Remind patient they can make refill requests via MyChart**  Medication refill request (Name & Dosage):  Touleo Solostar 300 unit/ML (75units daily)  Preferred pharmacy (Name & Address):  [facility] [place]  Other comments (if applicable): Patient would like a call back confirming whether or not the script can be filled.

## 2016-11-24 NOTE — Telephone Encounter (Signed)
Call to patient. Per ROI can leave message on voice mail. Voice mail message has number confirmation. Left detailed message advising we need update regarding plan for sugery and want to confirm she is ok. Left message to call back.

## 2016-11-25 ENCOUNTER — Other Ambulatory Visit: Payer: Self-pay

## 2016-11-25 DIAGNOSIS — IMO0002 Reserved for concepts with insufficient information to code with codable children: Secondary | ICD-10-CM

## 2016-11-25 DIAGNOSIS — E1165 Type 2 diabetes mellitus with hyperglycemia: Secondary | ICD-10-CM

## 2016-11-25 DIAGNOSIS — Z794 Long term (current) use of insulin: Principal | ICD-10-CM

## 2016-11-25 MED ORDER — INSULIN GLARGINE 300 UNIT/ML ~~LOC~~ SOPN: 75.0000 [IU] | PEN_INJECTOR | Freq: Every day | SUBCUTANEOUS | 1 refills | Status: DC

## 2016-11-25 NOTE — Telephone Encounter (Signed)
Call to patient. Immediately upon hearing my voice patient realizes she has missed appointment. Very apologetic. Appointment scheduled for 11-26-16 at 2pm.  Routing to provider for final review. Patient agreeable to disposition. Will close encounter.

## 2016-11-25 NOTE — Telephone Encounter (Signed)
Called and notified patient that we had submitted in her refill for her medication.  

## 2016-11-25 NOTE — Telephone Encounter (Signed)
Called and notified patient that we had submitted in her refill for her medication.

## 2016-11-26 ENCOUNTER — Inpatient Hospital Stay (HOSPITAL_COMMUNITY)
Admission: RE | Admit: 2016-11-26 | Discharge: 2016-11-26 | Disposition: A | Discharge status: Home / Self Care | Payer: BLUE CROSS/BLUE SHIELD | Source: Ambulatory Visit

## 2016-11-26 ENCOUNTER — Encounter: Payer: Self-pay | Admitting: Obstetrics and Gynecology

## 2016-11-26 ENCOUNTER — Ambulatory Visit (INDEPENDENT_AMBULATORY_CARE_PROVIDER_SITE_OTHER): Payer: BLUE CROSS/BLUE SHIELD | Admitting: Obstetrics and Gynecology

## 2016-11-26 VITALS — BP 108/64 | HR 84 | Resp 18 | Wt 291.0 lb

## 2016-11-26 DIAGNOSIS — D500 Iron deficiency anemia secondary to blood loss (chronic): Secondary | ICD-10-CM

## 2016-11-26 DIAGNOSIS — L680 Hirsutism: Secondary | ICD-10-CM

## 2016-11-26 DIAGNOSIS — N921 Excessive and frequent menstruation with irregular cycle: Secondary | ICD-10-CM

## 2016-11-26 DIAGNOSIS — Z3009 Encounter for other general counseling and advice on contraception: Secondary | ICD-10-CM

## 2016-11-26 NOTE — Patient Instructions (Addendum)
Your procedure is scheduled on: Tuesday December 08, 2016 at 12:00  Enter through the Main Entrance of [facility] at: 10:30 am  Pick up the phone at the desk and dial [phone].  Call this number if you have problems the morning of surgery: [phone].  Remember: Do NOT eat food: after Midnight on Monday August 13 Do NOT drink clear liquids after: Take these medicines the morning of surgery with a SIP OF WATER: LIPITOR, CELEXA, PEPCID,  BRING INHALER WITH YOU DAY OF SURGERY  Do NOT wear jewelry (body piercing), metal hair clips/bobby pins, make-up, or nail polish. Do NOT wear lotions, powders, or perfumes.  You may wear deoderant. Do NOT shave for 48 hours prior to surgery. Do NOT bring valuables to the hospital. Contacts, dentures, or bridgework may not be worn into surgery.  Have a responsible adult drive you home and stay with you for 24 hours after your procedure

## 2016-11-26 NOTE — Progress Notes (Signed)
GYNECOLOGY  VISIT   HPI: 51 y.o.   Legally Separated  African American  female   [phone] with Patient's last menstrual period was 11/05/2016.   here for surgery consult for hysteroscopy, D&C, mirena IUD insertion.  She has been evaluated for AUB leading to anemia. She had an office D&C on 5/16 and was treated with provera. Biopsy returned with mostly blood and scant benign endometrium. She had a normal ultrasound and normal TSH. Her last hgb was a month ago and was 8.1 gm/dl. She has had an iron transfusion x 2. She is feeling better. The patient had a mirena IUD and expulsed it, likely a few years ago, that's when her bleeding got very bad. She was unable to tolerate IUD insertion in the office.   She has a h/o difficulty waking up after one of her surgeries and she would like to talk with anesthesia. That surgery was at [facility]. Next surgery was on her knee and she didn't have any issues.   The patient is c/o increase in hair growth on her chin, upper lip, side burns.  GYNECOLOGIC HISTORY: Patient's last menstrual period was 11/05/2016. Contraception:none  Menopausal hormone therapy: none        OB History    Gravida Para Term Preterm AB Living   '3 2     1 2   ' SAB TAB Ectopic Multiple Live Births   1                 Patient Active Problem List   Diagnosis Date Noted  . Unilateral primary osteoarthritis, left knee 11/05/2016  . Acute pain of left knee 10/14/2016  . Insulin dependent type 2 diabetes mellitus, uncontrolled ([place]) 12/19/2015  . Asthma, mild intermittent 12/19/2015  . Bilateral lower extremity edema 12/19/2015  . Rash and nonspecific skin eruption 10/19/2013  . DJD (degenerative joint disease) of knee 06/29/2013  . Unspecified constipation 05/08/2013  . Menorrhagia 03/30/2013  . Dysmenorrhea 03/30/2013  . Dyspnea 06/11/2010  . GRIEF REACTION 07/17/2009  . Edema 01/03/2009  . Morbid obesity ([place]) 12/21/2008  . Palpitations 12/21/2008  . GENITAL HERPES 03/22/2007   . HYPERLIPIDEMIA 03/22/2007  . Iron deficiency anemia 03/22/2007  . Anxiety disorder 03/22/2007  . DEPRESSION 03/22/2007  . Essential hypertension 03/22/2007  . ALLERGIC RHINITIS 03/22/2007  . [place] DISEASE, CERVICAL 03/22/2007  . POSITIVE PPD 03/22/2007    Past Medical History:  Diagnosis Date  . Allergic rhinitis   . Anemia   . Anxiety   . Asthma, mild intermittent 12/19/2015  . Depression   . Diabetes mellitus type 2 in obese ([place])   . Hyperlipidemia   . Hypertension   . Morbid obesity ([place])     Past Surgical History:  Procedure Laterality Date  . ABDOMINAL SURGERY     chronic abd wall abscess  . CESAREAN SECTION     x's 2  . CHOLECYSTECTOMY    . LIPOMA EXCISION  08/03/2011   Procedure: EXCISION LIPOMA;  Surgeon: Harl Bowie, MD;  Location: [facility];  Service: General;  Laterality: Right;  wide excision chronic abscess Right lower abdominal wall  . OOPHORECTOMY     R ovary removed at [place]    Records from 08/14/09 surgery removed. She had an "attempted open laparoscopy, subsequent exploratory laparotomy with right oophorectomy, and umbilical hernia repair". She also had a D&C.  Pathology: right ovary-dermoid, EMC was benign.   Current Outpatient Prescriptions  Medication Sig Dispense Refill  .  acetaminophen-codeine (TYLENOL #3) 300-30 MG tablet Take 1-2 tablets by mouth every 8 (eight) hours as needed for moderate pain. 60 tablet 0  . albuterol (PROAIR HFA) 108 (90 Base) MCG/ACT inhaler Inhale 2 puffs into the lungs 4 (four) times daily. (Patient taking differently: Inhale 2 puffs into the lungs every 6 (six) hours as needed for wheezing or shortness of breath. ) 18 g 3  . atorvastatin (LIPITOR) 10 MG tablet TAKE 1 TABLET BY MOUTH EVERY DAY 90 tablet 0  . BAYER MICROLET LANCETS lancets Use as instructed to check sugar up to 6 times daily 600 each 5  . betamethasone valerate ointment (VALISONE) 0.1 % Apply 1 application topically daily as  needed. ECZEMA  0  . Blood Glucose Monitoring Suppl (BAYER CONTOUR MONITOR) w/Device KIT Use to check sugar daily 1 each 0  . canagliflozin (INVOKANA) 300 MG TABS tablet Take 1 tablet (300 mg total) by mouth daily. 90 tablet 3  . citalopram (CELEXA) 40 MG tablet TAKE 1 TABLET DAILY 90 tablet 0  . clobetasol cream (TEMOVATE) 9.02 % Apply 1 application topically 2 (two) times daily. Up to 2 weeks at the time (Patient taking differently: Apply 1 application topically 2 (two) times daily as needed (eczema). Up to 2 weeks at the time) 45 g 1  . Famotidine (PEPCID AC PO) Take 1 tablet by mouth every 6 (six) hours as needed (heartburn).    . fluticasone (FLONASE) 50 MCG/ACT nasal spray PLACE 2 SPRAYS INTO BOTH NOSTRILS DAILY. (Patient taking differently: Place 2 sprays into both nostrils daily as needed for allergies or rhinitis. ) 16 g 3  . fluticasone (FLOVENT HFA) 44 MCG/ACT inhaler Inhale 1 puff into the lungs 2 (two) times daily as needed (shortness of breath).    . furosemide (LASIX) 20 MG tablet Take 20 mg by mouth 2 (two) times daily.    Marland Kitchen glucose blood (BAYER CONTOUR TEST) test strip Use as instructed to check sugar 6 times daily 600 each 5  . hydroquinone 4 % cream APPLY TO AFFECTED AREA TWICE A DAY (Patient taking differently: Apply 1 application topically 2 (two) times daily as needed. APPLY TO AFFECTED AREA TWICE A DAY) 28.35 g 2  . hydrOXYzine (ATARAX/VISTARIL) 25 MG tablet Take 12.5 mg by mouth daily as needed for anxiety.  10  . ibuprofen (ADVIL,MOTRIN) 600 MG tablet TAKE 1 TABLET BY MOUTH EVERY 8 HOURS AS NEEDED (Patient taking differently: TAKE 1 TABLET BY MOUTH EVERY 8 HOURS AS NEEDED FOR PAIN) 60 tablet 0  . insulin aspart (NOVOLOG FLEXPEN) 100 UNIT/ML FlexPen INJECT 25 UNITS INTO THE SKIN 3 (THREE) TIMES DAILY 15 min before MEALS. 90 mL 1  . Insulin Glargine (TOUJEO SOLOSTAR) 300 UNIT/ML SOPN Inject 75 Units into the skin daily. 21 pen 1  . Insulin Pen Needle 31G X 5 MM MISC Use 4x a day  400 each 3  . metFORMIN (GLUCOPHAGE-XR) 500 MG 24 hr tablet Take 2 tablets (1,000 mg total) by mouth 2 (two) times daily. 360 tablet 3  . metoprolol succinate (TOPROL XL) 25 MG 24 hr tablet Take one tablet by mouth daily.( Take with 50 mg to total 75 mg daily.) 90 tablet 3  . metoprolol succinate (TOPROL-XL) 50 MG 24 hr tablet Take one tablet by mouth daily (Take with 25 mg tablet to total 75 mg daily) 90 tablet 3  . Multiple Vitamins-Minerals (MULTIVITAMIN WITH MINERALS) tablet Take 1 tablet by mouth daily.    Marland Kitchen NOVOLOG FLEXPEN 100 UNIT/ML FlexPen  INJECT 25 UNITS INTO THE SKIN 3 (THREE) TIMES DAILY WITH MEALS. 90 mL 1  . valsartan (DIOVAN) 320 MG tablet TAKE 1 TABLET (320 MG TOTAL) BY MOUTH DAILY. 90 tablet 0   No current facility-administered medications for this visit.      ALLERGIES: Ramipril  Family History  Problem Relation Age of Onset  . Diabetes Mother   . Hypertension Mother   . Other Mother        renal failure  . Diabetes Other   . Hypertension Father   . Cancer Father     Social History   Social History  . Marital status: Legally Separated    Spouse name: N/A  . Number of children: N/A  . Years of education: N/A   Occupational History  . Not on file.   Social History Main Topics  . Smoking status: Never Smoker  . Smokeless tobacco: Never Used     Comment: Married  . Alcohol use No  . Drug use: No  . Sexual activity: Not Currently    Partners: Male    Birth control/ protection: None   Other Topics Concern  . Not on file   Social History Narrative  . No narrative on file    Review of Systems  Constitutional: Negative.   HENT: Negative.   Eyes: Negative.   Respiratory: Negative.   Cardiovascular: Negative.   Gastrointestinal: Negative.   Genitourinary: Negative.   Musculoskeletal: Negative.   Skin: Negative.   Neurological: Negative.   Endo/Heme/Allergies: Negative.   Psychiatric/Behavioral: Negative.     PHYSICAL EXAMINATION:    BP  108/64 (BP Location: Right Wrist, Patient Position: Sitting, Cuff Size: Normal)   Pulse 84   Resp 18   Wt 291 lb (132 kg)   LMP 11/05/2016   BMI 48.42 kg/m     General appearance: alert, cooperative and appears stated age Neck: no adenopathy, supple, symmetrical, trachea midline and thyroid normal to inspection and palpation  Heart: regular rate and rhythm Lungs: CTAB Abdomen: soft, non-tender; bowel sounds normal; no masses,  no organomegaly Extremities: normal, atraumatic, no cyanosis Skin: normal color, texture and turgor, no rashes or lesions Lymph: normal cervical supraclavicular and inguinal nodes Neurologic: grossly normal    ASSESSMENT Abnormal uterine bleeding Anemia Contraception H/O diabetes, hypertension, elevated lipids, asthma and mild sleep apnea BP low on anti-hypertensive medications New onset hirsutism   PLAN Plan: hysteroscopy, dilation and curettage, mirena IUD insertion. Reviewed risks, including: bleeding, infection, uterine perforation, fluid overload, need for further surgery Will need an pre-op anesthesia consultation given medical history and prior difficulty with "waking up from surgery" She needs to f/u with Cardiology in regards to her antihypertensive medications Needs to be first case of the day because of her diabetes  Check hirsutism labs    An After Visit Summary was printed and given to the patient.

## 2016-11-27 ENCOUNTER — Telehealth: Payer: Self-pay | Admitting: *Deleted

## 2016-11-27 ENCOUNTER — Telehealth: Payer: Self-pay | Admitting: Interventional Cardiology

## 2016-11-27 NOTE — Telephone Encounter (Signed)
New message         [facility] Pre-operative Risk Assessment    Request for surgical clearance:  What type of surgery is being performed? Hysteroscopy D&C When is this surgery scheduled? 12-08-16 Are there any medications that need to be held prior to surgery and how long?  Need cardiac clearance 1. Name of physician performing surgery?  Dr Oscar La  What is your office phone and fax number?  Fax [phone] Cecil Cranker Price 11/27/2016, 12:09 PM  _________________________________________________________________   (provider comments below)

## 2016-11-27 NOTE — Telephone Encounter (Signed)
Call to [facility] PAT department requesting anesthesia consult.  Call to [facility] Cardiology, Dr Katrinka Blazing. Spoke to  Medical Center requesting cardiology clearance. Left message to call back.

## 2016-11-29 NOTE — Telephone Encounter (Signed)
There are no medications that we prescribe that need to be held. Not on anticoagulation or antiplatelet therapy.

## 2016-11-30 LAB — TESTT+TESTF+SHBG
Sex Hormone Binding: 28.1 nmol/L (ref 24.6–122.0)
TESTOSTERONE FREE: 1.5 pg/mL (ref 0.0–4.2)
TESTOSTERONE, TOTAL: 10 ng/dL

## 2016-11-30 LAB — DHEA-SULFATE: DHEA SO4: 137 ug/dL (ref 41.2–243.7)

## 2016-11-30 LAB — 17-HYDROXYPROGESTERONE: 17 HYDROXYPROGESTERONE: 25 ng/dL

## 2016-11-30 NOTE — Telephone Encounter (Signed)
Call from Tamika at [facility] Pre-anesthesia  Testing. Patient no showed for appointment for 11-26-16. They called her and have re-scheduled appointment for Wednesday 12-02-16 and have her scheduled for anesthesia consult during that visit.  Advised Tamika to call if patient does not keep appointment. Patient no showed for first surgery pre-op with our office as well.

## 2016-12-01 NOTE — Telephone Encounter (Signed)
Faxed to requesting office. 

## 2016-12-01 NOTE — Patient Instructions (Addendum)
Your procedure is scheduled on:  Tuesday, Aug. 14, 2018  Enter through the [employer] [facility] at:  10:30 AM  Pick up the phone at the desk and dial [phone].  Call this number if you have problems the morning of surgery: [phone].  Remember: Do NOT eat food:  After Midnight Monday  Do NOT drink clear liquids after:  6:00 AM Tuesday  Take these medicines the morning of surgery with a SIP OF WATER:  Atorvastatin, Celexa, Pepcid, Metoprolol, Valsartan,  Toujeo 40 mg at bedtime the night before surgery  Do NOT take evening dose of Metformin on Monday  Bring Asthma Inhaler day of surgery  Stop ALL herbal medications at this time  Do NOT smoke the day of surgery.  Do NOT wear jewelry (body piercing), metal hair clips/bobby pins, make-up, artifical eyelashes or nail polish. Do NOT wear lotions, powders, or perfumes.  You may wear deodorant. Do NOT shave for 48 hours prior to surgery. Do NOT bring valuables to the hospital. Contacts, dentures, or bridgework may not be worn into surgery.  Have a responsible adult drive you home and stay with you for 24 hours after your procedure  Bring a copy of your healthcare power of attorney and living will documents.

## 2016-12-02 ENCOUNTER — Encounter (HOSPITAL_COMMUNITY): Payer: Self-pay

## 2016-12-02 ENCOUNTER — Ambulatory Visit (INDEPENDENT_AMBULATORY_CARE_PROVIDER_SITE_OTHER): Payer: BLUE CROSS/BLUE SHIELD | Admitting: Family Medicine

## 2016-12-02 ENCOUNTER — Encounter (HOSPITAL_COMMUNITY)
Admission: RE | Admit: 2016-12-02 | Discharge: 2016-12-02 | Disposition: A | Discharge status: Home / Self Care | Payer: BLUE CROSS/BLUE SHIELD | Source: Ambulatory Visit | Attending: Obstetrics and Gynecology | Admitting: Obstetrics and Gynecology

## 2016-12-02 ENCOUNTER — Other Ambulatory Visit (HOSPITAL_COMMUNITY): Payer: BLUE CROSS/BLUE SHIELD

## 2016-12-02 VITALS — BP 110/70 | HR 90 | Temp 98.7°F | Wt 293.4 lb

## 2016-12-02 DIAGNOSIS — L0201 Cutaneous abscess of face: Secondary | ICD-10-CM | POA: Diagnosis not present

## 2016-12-02 DIAGNOSIS — Z01818 Encounter for other preprocedural examination: Secondary | ICD-10-CM | POA: Diagnosis not present

## 2016-12-02 HISTORY — DX: Adverse effect of unspecified anesthetic, initial encounter: T41.45XA

## 2016-12-02 HISTORY — DX: Pneumonia, unspecified organism: J18.9

## 2016-12-02 HISTORY — DX: Sleep apnea, unspecified: G47.30

## 2016-12-02 HISTORY — DX: Unspecified osteoarthritis, unspecified site: M19.90

## 2016-12-02 HISTORY — DX: Other complications of anesthesia, initial encounter: T88.59XA

## 2016-12-02 LAB — BASIC METABOLIC PANEL
ANION GAP: 7 (ref 5–15)
BUN: 10 mg/dL (ref 6–20)
CHLORIDE: 103 mmol/L (ref 101–111)
CO2: 25 mmol/L (ref 22–32)
Calcium: 9.2 mg/dL (ref 8.9–10.3)
Creatinine, Ser: 0.53 mg/dL (ref 0.44–1.00)
GFR calc non Af Amer: >60 mL/min (ref >60)
GLUCOSE: 229 mg/dL — AB (ref 65–99)
POTASSIUM: 3.3 mmol/L — AB (ref 3.5–5.1)
Sodium: 135 mmol/L (ref 135–145)

## 2016-12-02 LAB — CBC WITH DIFFERENTIAL/PLATELET
BASOS ABS: 0 10*3/uL (ref 0.0–0.1)
BASOS PCT: 0 %
Eosinophils Absolute: 0.3 10*3/uL (ref 0.0–0.7)
Eosinophils Relative: 4 %
HEMATOCRIT: 36.8 % (ref 36.0–46.0)
HEMOGLOBIN: 11.6 g/dL — AB (ref 12.0–15.0)
LYMPHS PCT: 38 %
Lymphs Abs: 3.3 10*3/uL (ref 0.7–4.0)
MCH: 27.3 pg (ref 26.0–34.0)
MCHC: 31.5 g/dL (ref 30.0–36.0)
MCV: 86.6 fL (ref 78.0–100.0)
MONO ABS: 0.2 10*3/uL (ref 0.1–1.0)
Monocytes Relative: 2 %
NEUTROS ABS: 4.8 10*3/uL (ref 1.7–7.7)
NEUTROS PCT: 56 %
Platelets: 416 10*3/uL — ABNORMAL HIGH (ref 150–400)
RBC: 4.25 MIL/uL (ref 3.87–5.11)
RDW: 20.1 % — ABNORMAL HIGH (ref 11.5–15.5)
WBC: 8.6 10*3/uL (ref 4.0–10.5)

## 2016-12-02 LAB — HEMOGLOBIN A1C
HEMOGLOBIN A1C: 6.7 % — AB (ref 4.8–5.6)
MEAN PLASMA GLUCOSE: 145.59 mg/dL

## 2016-12-02 MED ORDER — DOXYCYCLINE HYCLATE 100 MG PO TABS: 100.0000 mg | ORAL_TABLET | Freq: Two times a day (BID) | ORAL | 0 refills | Status: DC

## 2016-12-02 NOTE — Progress Notes (Signed)
Subjective:     Patient ID: Heather Myers, female   DOB: 09-Dec-1965, 51 y.o.   MRN: [id number]  HPI Patient seen with swollen painful area right lower mandible region. First noted on Sunday. Progressive pain and swelling since then. No fever. No chills. No prior history of MRSA. Has used occasional warm compresses. She has type 2 diabetes. She was concerned because she has elective D&C this coming Tuesday. Allergy to verapamil. No antibiotic allergies. Denies any other skin rashes or lesions  Past Medical History:  Diagnosis Date  . Allergic rhinitis   . Anemia   . Anxiety   . Arthritis   . Asthma, mild intermittent 12/19/2015  . Complication of anesthesia    difficulty waking up from anesthesia  . Depression   . Diabetes mellitus type 2 in obese ([facility])   . Hyperlipidemia   . Hypertension   . Low blood pressure reading    noticed lately  . Morbid obesity ([facility])   . Pneumonia    history of  . Sleep apnea    mild sleep, no CPAP needed at this time   Past Surgical History:  Procedure Laterality Date  . ABDOMINAL SURGERY     chronic abd wall abscess  . CESAREAN SECTION     x's 2  . CHOLECYSTECTOMY    . KNEE ARTHROSCOPY Right   . LIPOMA EXCISION  08/03/2011   Procedure: EXCISION LIPOMA;  Surgeon: Shelly Rubenstein, MD;  Location: [facility];  Service: General;  Laterality: Right;  wide excision chronic abscess Right lower abdominal wall  . OOPHORECTOMY     R ovary removed at [facility]  . RSO      reports that she has never smoked. She has never used smokeless tobacco. She reports that she drinks alcohol. She reports that she does not use drugs. family history includes Cancer in her father; Diabetes in her mother and other; Hypertension in her father and mother; Other in her mother. Allergies  Allergen Reactions  . Ramipril Shortness Of Breath     Review of Systems  Constitutional: Negative for chills and fever.       Objective:   Physical Exam   Constitutional: She appears well-developed and well-nourished.  Cardiovascular: Normal rate and regular rhythm.   Pulmonary/Chest: Effort normal and breath sounds normal. No respiratory distress. She has no wheezes. She has no rales.  Skin:  Patient has swollen indurated area right lower mandible region on the skin which is approximately 1 cm diameter. near center there is a slightly pustular yellow discoloration. No fluctuance. Very tender to palpation. Slightly warm to touch.       Assessment:     Abscess right side of face. Indurated and nonfluctuant. Patient has history of keloid formation    Plan:     -Start doxycycline 100 mg twice daily for 10 days -Warm compresses several times daily -Follow-up with primary if not improving in a couple days and sooner as needed -We'll try to avoid incision drainage of possible because of her history of keloids. She did not have any fluctuance to suggest need for incision at this time  Kristian Covey MD [facility]

## 2016-12-02 NOTE — Pre-Procedure Instructions (Signed)
Dr. Sandford Craze made aware of elevated glucose, no new orders received at this time.

## 2016-12-02 NOTE — Patient Instructions (Signed)

## 2016-12-03 ENCOUNTER — Telehealth: Payer: Self-pay | Admitting: Interventional Cardiology

## 2016-12-03 ENCOUNTER — Telehealth: Payer: Self-pay | Admitting: *Deleted

## 2016-12-03 NOTE — Telephone Encounter (Signed)
Patient returning your call.

## 2016-12-03 NOTE — Telephone Encounter (Signed)
Left message to call Heather Myers at [phone].  

## 2016-12-03 NOTE — Telephone Encounter (Signed)
Office is needing cardiac clearance for procedure.

## 2016-12-03 NOTE — Telephone Encounter (Signed)
° °  [facility] Pre-operative Risk Assessment    Request for surgical clearance:  1. What type of surgery is being performed? DNC  2. When is this surgery scheduled? 8/14  3. Are there any medications that need to be held prior to surgery and how long? Per Noreene Larsson wants to know if they need to add or alternate pts potassium   4. Name of physician performing surgery? Marlowe Sax  5. What is your office phone and fax number? [phone] fax [phone]  Tilda Burrow 12/03/2016, 3:27 PM  _________________________________________________________________   (provider comments below)

## 2016-12-03 NOTE — Telephone Encounter (Signed)
Spoke with patient, advised as seen below per Dr. Edward Jolly. Patient states she was seen by PCP on 12/02/16 and Potassium level was discussed, was provided with a list of foods that would be good sources of potassium, no medications discussed at this time. Patient states cardiologist manages her Lasix. Patient verbalizes understanding and is agreeable.   Advised patient would update Dr. Edward Jolly and return call with any additional recommendations.

## 2016-12-03 NOTE — Telephone Encounter (Signed)
Last seen in November 2017. No heart problem was diagnosed. No reason to suggest not proceeding. However we have no recent exposure.

## 2016-12-03 NOTE — Telephone Encounter (Signed)
Left message for pt to call back in regards to one time dose of K+.    Will fax info to requesting office.

## 2016-12-03 NOTE — Telephone Encounter (Signed)
Please have her take 40 meq potassium today (total for today).

## 2016-12-03 NOTE — Telephone Encounter (Signed)
Will route to Dr. Katrinka Blazing for review and advisement.  Please address number [phone] in previous message.  Pt had pre procedure labs and K+ was 3.3.  PCP discussed with her and advised to eat K+ rich foods.  No supplemental K+ given.

## 2016-12-03 NOTE — Telephone Encounter (Signed)
Faxed to requesting office. 

## 2016-12-03 NOTE — Telephone Encounter (Signed)
Notes recorded by Leda Min, RN on 12/03/2016 at 1:13 PM EDT Left message to call Noreene Larsson at [phone]. See telephone encounter dated 12/03/16.  ------  Notes recorded by Patton Salles, MD on 12/02/2016 at 11:56 PM EDT Please contact patient with her preop labs.  She has surgery on 12/08/16 with Dr. Rolan Bucco.   Her hemoglobin A1C is 6.7, which is improved from her last level of 7.4.  Her potassium is 3.3, which is a little below the desired range of 3.5 - 5.1. This is likely from her Lasix.  She may need to start supplementing her potassium with prescription KCl through her PCP. Her hemoglobin is 11.6, which is also improved from June when it measured 8.1  Please contact her PCP to share results and see if they would like any changes in her medications prior to surgery.

## 2016-12-03 NOTE — Telephone Encounter (Signed)
Fax received from [facility], Dr. Katrinka Blazing -to Dr. Edward Jolly for review.

## 2016-12-04 ENCOUNTER — Telehealth: Payer: Self-pay

## 2016-12-04 MED ORDER — POTASSIUM CHLORIDE CRYS ER 20 MEQ PO TBCR: 40.0000 meq | EXTENDED_RELEASE_TABLET | Freq: Once | ORAL | 0 refills | Status: DC

## 2016-12-04 NOTE — Telephone Encounter (Signed)
LMVM for Kennon Rounds to call back

## 2016-12-04 NOTE — Progress Notes (Signed)
12/04/2016 11:35 Call from [place] from [facility] office asking for clarification about why she hasn't received surgical clearance communication from our office. Per Dr Michaelle Copas nurse the surgical clearance communication was faxed multiple times to surgeon's office. Dr Katrinka Blazing explained he has only seen this patient once in November 2017 for dyspnea.  Cardiac etiology was ruled out, therefore patient needs to see her PCP for surgical clearance. If PCP has concerns about cardiac issues pt can be referred back to Cardiology. Call to Kennon Rounds to notify her of Dr Michaelle Copas instruction.  Jim Like MHA RN CCM

## 2016-12-04 NOTE — Telephone Encounter (Signed)
Spoke with patient, advised to return call to Cardiologist, Dr. Katrinka Blazing, in regards to medication recommendation for low potassium. Patient states she has not called their office, will call them next. Reviewed telephone encounter dated 11/27/16 and MyChart messages as seen below. Patient verbalizes understanding and is agreeable.  Routing to covering provider for final review. Patient is agreeable to disposition. Will close encounter.  Cc: Dr. Oscar La      Notes recorded by Patton Salles, MD on 11/30/2016 at 8:49 PM EDT Results to patient through My Chart.  Hello Madisin,   This is Dr. Edward Jolly reviewing Dr. Salli Quarry labs while she is out of the office.  Your testosterone, 17 hydroxyprogesterone, and DHEA-S labs are all normal.  These were ordered to evaluate for excess hair growth.   Please contact the office for any questions.   Conley Simmonds, MD  ------  Notes recorded by Romualdo Bolk, MD on 11/27/2016 at 9:40 AM EDT Results and any recommendations were sent via MyChart.   Hi Kaidyn, 3 of the 5 lab tests are back and are normal. Someone from the office will contact you with the other results. Hope you have a great weekend! Gertie Exon

## 2016-12-04 NOTE — Telephone Encounter (Signed)
Kennon Rounds @ Dr. Salli Quarry office called to request medical clearance for surgery scheduled for 12/08/16. Pt is scheduled for DILATATION & CURETTAGE/HYSTEROSCOPY WITH MYOSURE d/t Menometrorrhagia.   Dr. [place] - Please advise on surgical clearance. Thanks!

## 2016-12-04 NOTE — Telephone Encounter (Signed)
Spoke with pt and went over recommendations per Dr. Katrinka Blazing.  Pt verbalized understanding and was in agreement with this plan.  Scheduled pt to see Dr. Katrinka Blazing 10/12 for f/u.  Pt appreciative for call.

## 2016-12-04 NOTE — Telephone Encounter (Signed)
Fax received from cardiology stating no medications to be held. Sent staff message requesting clarification and need for surgical clearance. 2nd fax received today. Last seen in Nov 2017. No heart problem diagnosed but no recent exposure.  Call to [facility]. Spoke with Development worker, international aid Terri. She confirms per Dr Katrinka Blazing, patient seen 02-2016 for dyspnea and no heart problem found. Patient should be seen by PCP to be cleared for surgery.

## 2016-12-04 NOTE — Telephone Encounter (Signed)
Spoke with Dr. Salli Quarry office and advised that pt needs appt for CPE because we have only ever seen her for acute visits. Pt scheduled for 12/07/16 @ 11:30am, they are contacting pt to advise. Nothing further needed at this time.

## 2016-12-04 NOTE — Telephone Encounter (Signed)
Left detailed message, ok per current dpr. Advised patient that her Cardiologist, Dr. Katrinka Blazing, will make recommended changes to medication for low potassium as previously discussed. Please f/u with Dr. Michaelle Copas office for those recommendations. Return call to office at [phone] with any additional questions.   Dr. Edward Jolly -ok to close encounter?  Cc: Dr. Oscar La

## 2016-12-04 NOTE — Telephone Encounter (Signed)
Call to patient to advise of appointment. Unable to leave message, voice mail box is full.    My Chart message sent regarding appointment.

## 2016-12-04 NOTE — Telephone Encounter (Signed)
Call to Dr Kathie Rhodes Jordan's office. Left message requesting surgical clearance for surgery scheduled for Tuesday 12-08-16.

## 2016-12-04 NOTE — Telephone Encounter (Signed)
I will be glad to see her for pre op clearance but I believe she is established with cardiology and endocrinology.I usually see her for acute visits. Thanks, BJ

## 2016-12-04 NOTE — Telephone Encounter (Signed)
Patient is returning a call to Jill. °

## 2016-12-04 NOTE — Telephone Encounter (Signed)
Sending to Dr. Oscar La for review.

## 2016-12-04 NOTE — Telephone Encounter (Signed)
Spoke with patient, advised of MyChart message as seen below. Patient aware of appointment with PCP, Dr. Swaziland on 12/07/16 at 11:30am. Patient verbalizes understanding and is agreeable.   Routing to covering provider for final review. Patient is agreeable to disposition. Will close encounter.  Cc:Dr. Oscar La

## 2016-12-04 NOTE — Telephone Encounter (Signed)
Call from [facility] at Dr Elvis Coil office. Patient has only been seen for sick visits. Needs office visit for PCP clearance. They can see her ar 1130 on Monday.

## 2016-12-07 ENCOUNTER — Ambulatory Visit (INDEPENDENT_AMBULATORY_CARE_PROVIDER_SITE_OTHER): Payer: BLUE CROSS/BLUE SHIELD | Admitting: Family Medicine

## 2016-12-07 ENCOUNTER — Telehealth: Payer: Self-pay | Admitting: Obstetrics and Gynecology

## 2016-12-07 ENCOUNTER — Telehealth: Payer: Self-pay | Admitting: Family Medicine

## 2016-12-07 ENCOUNTER — Encounter: Payer: Self-pay | Admitting: Family Medicine

## 2016-12-07 NOTE — Telephone Encounter (Signed)
Dr Oscar La reviewed call and patient chart. Appreciative of appointment for tomorrow. Call back to [facility]. They will call office with update after appointment tomorrow.   Routing to provider for final review. Patient agreeable to disposition. Will close encounter.

## 2016-12-07 NOTE — Telephone Encounter (Addendum)
Incoming call from [place] at Dr. Salli Quarry office called checking on the status of patient's surgical clearance. (Please see original phone note from 12/04/16). Carollee Herter spoke with Kennon Rounds and informed her that NP did not feel comfortable filling out form having not seen the patient before. Dr. Swaziland was unable to see patient today but will see the patient the first thing 8/14 in the morning. Kennon Rounds states she will discuss this with the surgeon and see if they can push patient's surgical date. Kennon Rounds had no additional questions at this time. Nothing further is needed.    FYICarollee Herter spoke with the patient while she was in the office and she is aware of her appt tomorrow.

## 2016-12-07 NOTE — Telephone Encounter (Signed)
Return call to Dr Elvis Coil office, Carollee Herter. Due to EPIC error, patient's appointment was not actually at 1130 today. They had no ability to work her in today for full exam to clear her for surgery tomorrow.  They are willing to work her in at 56 tomorrow am for CPE and surgical clearance.  Patient is scheduled for surgery tomorrow at  1200 at [facility].  Advised will review with Dr Oscar La and call her back.

## 2016-12-07 NOTE — Progress Notes (Signed)
HPI:   Heather Myers is a 51 y.o. female, who is here today for pre op clearance requested by Dr  Talbert Nan, gyn.   I last saw her on 09/07/16 for acute visit,lower abdominal pain and urinary symptoms.  She is planning on having hysteroscopy,D&C, and Mirena IUD insertion.  Procedure was discussed and planned on 09/24/16 with her gyn during [place]. She is having procedure today at 10:30 am.    She has Hx of HTN,DM II, obesity, asthma,mild OSA that did not require CPAP 95-6 years ago).  HTN: She is currently on Diovan 320 mg daily, Metoprolol Succinate 75 mg daily.  She takes Lasix 20 mg bid.  HypoK+, she is on K-DUR 20 meq daily x 2, completed.   She has followed with cardiologists for HTN management but seems like she has not followed with their office, Dr Tamala Julian.    Diabetes Mellitus II:   Currently on Novolog 25 U tid, Metformin 1000 mg bid,Toujeo 75 U daily, and Invokana 300 mg daily.  Checking BS's : FG 110-115 and post prandial 135-145.  She denies polydipsia, polyuria, or polyphagia.  Lab Results  Component Value Date   CREATININE 0.53 12/02/2016   BUN 10 12/02/2016   NA 135 12/02/2016   K 3.3 (L) 12/02/2016   CL 103 12/02/2016   CO2 25 12/02/2016    Lab Results  Component Value Date   HGBA1C 6.7 (H) 12/02/2016   Lab Results  Component Value Date   MICROALBUR 0.1 11/16/2013    She follows with endocrinologists, Dr Cruzita Lederer.    DUB with iron def anemia. She has followed with oncology, OV 09/28/16.  Lab Results  Component Value Date   WBC 8.6 12/02/2016   HGB 11.6 (L) 12/02/2016   HCT 36.8 12/02/2016   MCV 86.6 12/02/2016   PLT 416 (H) 12/02/2016    She has missed pre-anethesia (11/26/16) and gyn f/u appts (12/02/16).  She followed with Dr Tamala Julian in 02/2016, referred because exertional dyspnea, resolved.  Echo 06/22/16: LVEF 55-60%. Wall motion was normal; there were no regional wall motion abnormalities. Doppler parameters are  consistent withabnormal left ventricular relaxation (grade 1 diastolic dysfunction). Acoustic contrast opacification revealed no evidence ofthrombus.  Lexiscan stress test 07/2010: No ischemia.   Past Surgical History:  Procedure Laterality Date  . ABDOMINAL SURGERY  2012-2013   chronic abd wall abscess  . CESAREAN SECTION     x's 2  . CHOLECYSTECTOMY    . KNEE ARTHROSCOPY Right 2015  . LIPOMA EXCISION  08/03/2011     . OOPHORECTOMY     R ovary removed at [place]      Complications during or during post op recovery: According to pt, after right oophorectomy it was "hard to come up"from anesthesia but she had knee surgery after and did not have any problem.  No Hx of tobacco use.  She recently started abx treatment for facial abscess,6th day of Doxycycline. Symptoms resolved.  She denies fever,chills, or body aches. Denies dysuria,increased urinary frequency, gross hematuria,or decreased urine output.   Review of Systems  Constitutional: Negative for appetite change, chills, diaphoresis, fatigue and fever.  HENT: Negative for congestion, mouth sores, nosebleeds, sore throat and trouble swallowing.   Eyes: Negative for redness and visual disturbance.  Respiratory: Negative for cough, shortness of breath and wheezing.   Cardiovascular: Positive for leg swelling. Negative for chest pain and palpitations.  Gastrointestinal: Negative for abdominal pain, nausea and vomiting.  Negative for changes in bowel habits.  Endocrine: Negative for polydipsia, polyphagia and polyuria.  Genitourinary: Negative for decreased urine volume, dysuria and hematuria.  Musculoskeletal: Negative for gait problem and myalgias.  Skin: Negative for pallor and rash.  Allergic/Immunologic: Positive for environmental allergies.  Neurological: Negative for syncope, weakness and headaches.  Hematological: Negative for adenopathy. Does not bruise/bleed easily.  Psychiatric/Behavioral: Negative for  confusion and hallucinations. The patient is nervous/anxious.       Current Outpatient Prescriptions on File Prior to Visit  Medication Sig Dispense Refill  . acetaminophen-codeine (TYLENOL #3) 300-30 MG tablet Take 1-2 tablets by mouth every 8 (eight) hours as needed for moderate pain. 60 tablet 0  . albuterol (PROAIR HFA) 108 (90 Base) MCG/ACT inhaler Inhale 2 puffs into the lungs 4 (four) times daily. (Patient taking differently: Inhale 2 puffs into the lungs every 6 (six) hours as needed for wheezing or shortness of breath. ) 18 g 3  . atorvastatin (LIPITOR) 10 MG tablet TAKE 1 TABLET BY MOUTH EVERY DAY 90 tablet 0  . BAYER MICROLET LANCETS lancets Use as instructed to check sugar up to 6 times daily 600 each 5  . betamethasone valerate ointment (VALISONE) 0.1 % Apply 1 application topically daily as needed. ECZEMA  0  . Blood Glucose Monitoring Suppl (BAYER CONTOUR MONITOR) w/Device KIT Use to check sugar daily 1 each 0  . canagliflozin (INVOKANA) 300 MG TABS tablet Take 1 tablet (300 mg total) by mouth daily. 90 tablet 3  . citalopram (CELEXA) 40 MG tablet TAKE 1 TABLET DAILY 90 tablet 0  . clobetasol cream (TEMOVATE) 6.06 % Apply 1 application topically 2 (two) times daily. Up to 2 weeks at the time (Patient taking differently: Apply 1 application topically 2 (two) times daily as needed (eczema). Up to 2 weeks at the time) 45 g 1  . doxycycline (VIBRA-TABS) 100 MG tablet Take 1 tablet (100 mg total) by mouth 2 (two) times daily. 20 tablet 0  . Famotidine (PEPCID AC PO) Take 1 tablet by mouth every 6 (six) hours as needed (heartburn).    . fluticasone (FLONASE) 50 MCG/ACT nasal spray PLACE 2 SPRAYS INTO BOTH NOSTRILS DAILY. (Patient taking differently: Place 2 sprays into both nostrils daily as needed for allergies or rhinitis. ) 16 g 3  . fluticasone (FLOVENT HFA) 44 MCG/ACT inhaler Inhale 1 puff into the lungs 2 (two) times daily as needed (shortness of breath).    . furosemide (LASIX) 20  MG tablet Take 20 mg by mouth 2 (two) times daily.    Marland Kitchen glucose blood (BAYER CONTOUR TEST) test strip Use as instructed to check sugar 6 times daily 600 each 5  . hydroquinone 4 % cream APPLY TO AFFECTED AREA TWICE A DAY (Patient taking differently: Apply 1 application topically 2 (two) times daily as needed. APPLY TO AFFECTED AREA TWICE A DAY) 28.35 g 2  . hydrOXYzine (ATARAX/VISTARIL) 25 MG tablet Take 12.5 mg by mouth daily as needed for anxiety.  10  . ibuprofen (ADVIL,MOTRIN) 600 MG tablet TAKE 1 TABLET BY MOUTH EVERY 8 HOURS AS NEEDED (Patient taking differently: TAKE 1 TABLET BY MOUTH EVERY 8 HOURS AS NEEDED FOR PAIN) 60 tablet 0  . insulin aspart (NOVOLOG FLEXPEN) 100 UNIT/ML FlexPen INJECT 25 UNITS INTO THE SKIN 3 (THREE) TIMES DAILY 15 min before MEALS. 90 mL 1  . Insulin Glargine (TOUJEO SOLOSTAR) 300 UNIT/ML SOPN Inject 75 Units into the skin daily. 21 pen 1  . Insulin Pen Needle 31G  X 5 MM MISC Use 4x a day 400 each 3  . metFORMIN (GLUCOPHAGE-XR) 500 MG 24 hr tablet Take 2 tablets (1,000 mg total) by mouth 2 (two) times daily. 360 tablet 3  . metoprolol succinate (TOPROL XL) 25 MG 24 hr tablet Take one tablet by mouth daily.( Take with 50 mg to total 75 mg daily.) 90 tablet 3  . metoprolol succinate (TOPROL-XL) 50 MG 24 hr tablet Take one tablet by mouth daily (Take with 25 mg tablet to total 75 mg daily) 90 tablet 3  . Multiple Vitamins-Minerals (MULTIVITAMIN WITH MINERALS) tablet Take 1 tablet by mouth daily.    Marland Kitchen NOVOLOG FLEXPEN 100 UNIT/ML FlexPen INJECT 25 UNITS INTO THE SKIN 3 (THREE) TIMES DAILY WITH MEALS. 90 mL 1  . valsartan (DIOVAN) 320 MG tablet TAKE 1 TABLET (320 MG TOTAL) BY MOUTH DAILY. 90 tablet 0  . potassium chloride SA (K-DUR,KLOR-CON) 20 MEQ tablet Take 2 tablets (40 mEq total) by mouth once. (Patient taking differently: Take 40 mEq by mouth once. ) 2 tablet 0   No current facility-administered medications on file prior to visit.      Past Medical History:    Diagnosis Date  . Allergic rhinitis   . Anemia   . Anxiety   . Arthritis   . Asthma, mild intermittent 12/19/2015  . Complication of anesthesia    difficulty waking up from anesthesia  . Depression   . Diabetes mellitus type 2 in obese ([place])   . Hyperlipidemia   . Hypertension   . Low blood pressure reading    noticed lately  . Morbid obesity ([place])   . Pneumonia    history of  . Sleep apnea    mild sleep, no CPAP needed at this time   Allergies  Allergen Reactions  . Ramipril Shortness Of Breath    Social History   Social History  . Marital status: Legally Separated    Spouse name: N/A  . Number of children: N/A  . Years of education: N/A   Social History Main Topics  . Smoking status: Never Smoker  . Smokeless tobacco: Never Used     Comment: Married  . Alcohol use Yes     Comment: rare  . Drug use: No  . Sexual activity: Not Currently    Partners: Male    Birth control/ protection: None   Other Topics Concern  . None   Social History Narrative  . None    Vitals:   12/08/16 0721  BP: 110/78  Pulse: 93  Resp: 12  SpO2: 98%   Body mass index is 49.09 kg/m.   Physical Exam  Nursing note and vitals reviewed. Constitutional: She is oriented to person, place, and time. She appears well-developed. No distress.  HENT:  Head: Normocephalic and atraumatic.  Mouth/Throat: Oropharynx is clear and moist and mucous membranes are normal.  Eyes: Pupils are equal, round, and reactive to light. Conjunctivae and EOM are normal.  Neck: No JVD present.  Cardiovascular: Normal rate and regular rhythm.   No murmur heard. Pulses:      Dorsalis pedis pulses are 2+ on the right side, and 2+ on the left side.  Respiratory: Effort normal and breath sounds normal. No respiratory distress.  GI: Soft. She exhibits no mass. There is no hepatomegaly. There is no tenderness.  Musculoskeletal: She exhibits edema (1+ pitting LE edema LE,bilateral.). She exhibits no  tenderness.  Lymphadenopathy:    She has no cervical adenopathy.  Neurological: She  is alert and oriented to person, place, and time. She has normal strength. No cranial nerve deficit. Coordination and gait normal.  Skin: Skin is warm. No rash noted. No erythema.  Right lower mandible area with scarring changes, no erythema or local heat. Still papular lesion underneath, not tender.  Psychiatric: She has a normal mood and affect.  Well groomed, good eye contact.     ASSESSMENT AND PLAN:   Heather Myers was seen today for preo clearance.   Diagnoses and all orders for this visit:  Preoperative clearance -     EKG 12-Lead  DUB (dysfunctional uterine bleeding)   Here today for pre op clearance, she is having procedure under general anesthesia today at 10:30 Am.   DM II and HTN adequately controlled. EKG today SR,normal axis, LAE. No changes when compared with EKG in 2010. Hx of asthma, asymptomatic. I do not think CXR is needed at this time.  HypoK+: Completed oral supplementation. Recommend checking K+ levels prior to procedure.  Sstop Metformin 24 hours prior and resume 24-36 hours after if no complications.She did take las dose 2 days ago. Take antihypertensive and rest of medications. Monitor BS's closely. Adequate hydration, she is on Invokana.  She has had difficulty waking up from anesthesia after prior surgery (ophoorectomy) but reports no complications with last surgery in 2015 (knee arthroscopy). She also stopped NSAID's and not taking Aspirin for at least 7 days. Facial abscess: Resolved, complete abx treatment, Doxycycline.  Chronic problems stable. 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 At this point benefit of procedure seem to outweigh risk.  We will call and give verbal surgery clearance, ok to proceed.     Betty G. Martinique, MD  [facility]. [place] office.

## 2016-12-07 NOTE — Telephone Encounter (Signed)
Shena with Dr Elvis Coil office called Needs to speak with you about her surgical clearance.

## 2016-12-08 ENCOUNTER — Encounter (HOSPITAL_COMMUNITY): Payer: Self-pay

## 2016-12-08 ENCOUNTER — Ambulatory Visit (INDEPENDENT_AMBULATORY_CARE_PROVIDER_SITE_OTHER): Payer: BLUE CROSS/BLUE SHIELD | Admitting: Family Medicine

## 2016-12-08 ENCOUNTER — Ambulatory Visit (HOSPITAL_COMMUNITY)
Admission: RE | Admit: 2016-12-08 | Discharge: 2016-12-08 | Disposition: A | Discharge status: Home / Self Care | Payer: BLUE CROSS/BLUE SHIELD | Source: Ambulatory Visit | Attending: Obstetrics and Gynecology | Admitting: Obstetrics and Gynecology

## 2016-12-08 ENCOUNTER — Encounter: Payer: Self-pay | Admitting: Family Medicine

## 2016-12-08 ENCOUNTER — Telehealth: Payer: Self-pay | Admitting: Obstetrics and Gynecology

## 2016-12-08 ENCOUNTER — Encounter (HOSPITAL_COMMUNITY)
Admission: RE | Disposition: A | Discharge status: Home / Self Care | Payer: Self-pay | Source: Ambulatory Visit | Attending: Obstetrics and Gynecology

## 2016-12-08 ENCOUNTER — Telehealth: Payer: Self-pay

## 2016-12-08 ENCOUNTER — Ambulatory Visit (HOSPITAL_COMMUNITY): Payer: BLUE CROSS/BLUE SHIELD | Admitting: Anesthesiology

## 2016-12-08 VITALS — BP 110/78 | HR 93 | Resp 12 | Ht 65.0 in | Wt 295.0 lb

## 2016-12-08 DIAGNOSIS — E785 Hyperlipidemia, unspecified: Secondary | ICD-10-CM | POA: Diagnosis not present

## 2016-12-08 DIAGNOSIS — E1165 Type 2 diabetes mellitus with hyperglycemia: Secondary | ICD-10-CM | POA: Diagnosis not present

## 2016-12-08 DIAGNOSIS — D509 Iron deficiency anemia, unspecified: Secondary | ICD-10-CM | POA: Insufficient documentation

## 2016-12-08 DIAGNOSIS — N939 Abnormal uterine and vaginal bleeding, unspecified: Secondary | ICD-10-CM | POA: Diagnosis not present

## 2016-12-08 DIAGNOSIS — F329 Major depressive disorder, single episode, unspecified: Secondary | ICD-10-CM | POA: Insufficient documentation

## 2016-12-08 DIAGNOSIS — J4520 Mild intermittent asthma, uncomplicated: Secondary | ICD-10-CM | POA: Diagnosis not present

## 2016-12-08 DIAGNOSIS — Z30430 Encounter for insertion of intrauterine contraceptive device: Secondary | ICD-10-CM | POA: Diagnosis not present

## 2016-12-08 DIAGNOSIS — N921 Excessive and frequent menstruation with irregular cycle: Secondary | ICD-10-CM | POA: Diagnosis not present

## 2016-12-08 DIAGNOSIS — R7611 Nonspecific reaction to tuberculin skin test without active tuberculosis: Secondary | ICD-10-CM | POA: Diagnosis not present

## 2016-12-08 DIAGNOSIS — Z01818 Encounter for other preprocedural examination: Secondary | ICD-10-CM | POA: Diagnosis not present

## 2016-12-08 DIAGNOSIS — D649 Anemia, unspecified: Secondary | ICD-10-CM | POA: Diagnosis not present

## 2016-12-08 DIAGNOSIS — I10 Essential (primary) hypertension: Secondary | ICD-10-CM | POA: Diagnosis not present

## 2016-12-08 DIAGNOSIS — D500 Iron deficiency anemia secondary to blood loss (chronic): Secondary | ICD-10-CM | POA: Diagnosis not present

## 2016-12-08 DIAGNOSIS — N938 Other specified abnormal uterine and vaginal bleeding: Secondary | ICD-10-CM | POA: Diagnosis not present

## 2016-12-08 DIAGNOSIS — F4322 Adjustment disorder with anxiety: Secondary | ICD-10-CM | POA: Diagnosis not present

## 2016-12-08 DIAGNOSIS — N920 Excessive and frequent menstruation with regular cycle: Secondary | ICD-10-CM | POA: Diagnosis not present

## 2016-12-08 DIAGNOSIS — N946 Dysmenorrhea, unspecified: Secondary | ICD-10-CM | POA: Diagnosis not present

## 2016-12-08 DIAGNOSIS — Z794 Long term (current) use of insulin: Secondary | ICD-10-CM | POA: Insufficient documentation

## 2016-12-08 DIAGNOSIS — N710 Acute inflammatory disease of uterus: Secondary | ICD-10-CM | POA: Diagnosis not present

## 2016-12-08 HISTORY — PX: DILATATION & CURETTAGE/HYSTEROSCOPY WITH MYOSURE: SHX6511

## 2016-12-08 HISTORY — PX: INTRAUTERINE DEVICE (IUD) INSERTION: SHX5877

## 2016-12-08 LAB — GLUCOSE, CAPILLARY: Glucose-Capillary: 160 mg/dL — ABNORMAL HIGH (ref 65–99)

## 2016-12-08 LAB — PREGNANCY, URINE: Preg Test, Ur: NEGATIVE

## 2016-12-08 SURGERY — DILATATION & CURETTAGE/HYSTEROSCOPY WITH MYOSURE
Anesthesia: General

## 2016-12-08 MED ORDER — VASOPRESSIN 20 UNIT/ML IV SOLN
INTRAVENOUS | Status: AC
  Filled 2016-12-08: qty 1

## 2016-12-08 MED ORDER — ONDANSETRON HCL 4 MG/2ML IJ SOLN
INTRAMUSCULAR | Status: DC | PRN
  Administered 2016-12-08: 4 mg via INTRAVENOUS

## 2016-12-08 MED ORDER — LIDOCAINE 2% (20 MG/ML) 5 ML SYRINGE
INTRAMUSCULAR | Status: DC | PRN
  Administered 2016-12-08: 40 mg via INTRAVENOUS

## 2016-12-08 MED ORDER — SCOPOLAMINE 1 MG/3DAYS TD PT72
1.0000 | MEDICATED_PATCH | Freq: Once | TRANSDERMAL | Status: DC
  Administered 2016-12-08: 1.5 mg via TRANSDERMAL

## 2016-12-08 MED ORDER — ONDANSETRON HCL 4 MG/2ML IJ SOLN
INTRAMUSCULAR | Status: AC
  Filled 2016-12-08: qty 2

## 2016-12-08 MED ORDER — FENTANYL CITRATE (PF) 100 MCG/2ML IJ SOLN
25.0000 ug | INTRAMUSCULAR | Status: DC | PRN
  Administered 2016-12-08: 50 ug via INTRAVENOUS

## 2016-12-08 MED ORDER — KETOROLAC TROMETHAMINE 30 MG/ML IJ SOLN
30.0000 mg | Freq: Once | INTRAMUSCULAR | Status: AC
  Administered 2016-12-08: 30 mg via INTRAVENOUS

## 2016-12-08 MED ORDER — SCOPOLAMINE 1 MG/3DAYS TD PT72
MEDICATED_PATCH | TRANSDERMAL | Status: AC
  Administered 2016-12-08: 1.5 mg via TRANSDERMAL
  Filled 2016-12-08: qty 1

## 2016-12-08 MED ORDER — LACTATED RINGERS IV SOLN
INTRAVENOUS | Status: DC
  Administered 2016-12-08: 125 mL/h via INTRAVENOUS

## 2016-12-08 MED ORDER — SOD CITRATE-CITRIC ACID 500-334 MG/5ML PO SOLN
ORAL | Status: AC
  Administered 2016-12-08: 30 mL via ORAL
  Filled 2016-12-08: qty 15

## 2016-12-08 MED ORDER — ONDANSETRON HCL 4 MG/2ML IJ SOLN
4.0000 mg | Freq: Once | INTRAMUSCULAR | Status: AC | PRN
  Administered 2016-12-08: 4 mg via INTRAVENOUS

## 2016-12-08 MED ORDER — SODIUM CHLORIDE 0.9 % IJ SOLN
INTRAMUSCULAR | Status: AC
  Filled 2016-12-08: qty 100

## 2016-12-08 MED ORDER — FENTANYL CITRATE (PF) 100 MCG/2ML IJ SOLN
INTRAMUSCULAR | Status: AC
  Administered 2016-12-08: 50 ug via INTRAVENOUS
  Filled 2016-12-08: qty 2

## 2016-12-08 MED ORDER — LACTATED RINGERS IV SOLN: INTRAVENOUS | Status: DC

## 2016-12-08 MED ORDER — FENTANYL CITRATE (PF) 100 MCG/2ML IJ SOLN
INTRAMUSCULAR | Status: AC
  Filled 2016-12-08: qty 2

## 2016-12-08 MED ORDER — SOD CITRATE-CITRIC ACID 500-334 MG/5ML PO SOLN
30.0000 mL | ORAL | Status: AC
  Administered 2016-12-08: 30 mL via ORAL

## 2016-12-08 MED ORDER — MIDAZOLAM HCL 2 MG/2ML IJ SOLN
INTRAMUSCULAR | Status: AC
  Filled 2016-12-08: qty 2

## 2016-12-08 MED ORDER — FENTANYL CITRATE (PF) 100 MCG/2ML IJ SOLN
INTRAMUSCULAR | Status: DC | PRN
  Administered 2016-12-08 (×2): 25 ug via INTRAVENOUS
  Administered 2016-12-08: 50 ug via INTRAVENOUS

## 2016-12-08 MED ORDER — MIDAZOLAM HCL 5 MG/5ML IJ SOLN
INTRAMUSCULAR | Status: DC | PRN
  Administered 2016-12-08: 2 mg via INTRAVENOUS

## 2016-12-08 MED ORDER — PROPOFOL 10 MG/ML IV BOLUS
INTRAVENOUS | Status: AC
  Filled 2016-12-08: qty 20

## 2016-12-08 MED ORDER — PROPOFOL 10 MG/ML IV BOLUS
INTRAVENOUS | Status: DC | PRN
  Administered 2016-12-08: 150 mg via INTRAVENOUS

## 2016-12-08 MED ORDER — LIDOCAINE HCL (CARDIAC) 20 MG/ML IV SOLN
INTRAVENOUS | Status: AC
  Filled 2016-12-08: qty 5

## 2016-12-08 MED ORDER — SCOPOLAMINE 1 MG/3DAYS TD PT72: 1.0000 | MEDICATED_PATCH | Freq: Once | TRANSDERMAL | Status: DC

## 2016-12-08 MED ORDER — MEPERIDINE HCL 25 MG/ML IJ SOLN: 6.2500 mg | INTRAMUSCULAR | Status: DC | PRN

## 2016-12-08 MED ORDER — KETOROLAC TROMETHAMINE 30 MG/ML IJ SOLN
INTRAMUSCULAR | Status: AC
  Administered 2016-12-08: 30 mg via INTRAVENOUS
  Filled 2016-12-08: qty 1

## 2016-12-08 SURGICAL SUPPLY — 18 items
CANISTER SUCT 3000ML PPV (MISCELLANEOUS) ×2 IMPLANT
CATH ROBINSON RED A/P 16FR (CATHETERS) IMPLANT
CLOTH BEACON ORANGE TIMEOUT ST (SAFETY) ×2 IMPLANT
CONTAINER PREFILL 10% NBF 60ML (FORM) ×4 IMPLANT
DEVICE MYOSURE LITE (MISCELLANEOUS) IMPLANT
DEVICE MYOSURE REACH (MISCELLANEOUS) ×1 IMPLANT
FILTER ARTHROSCOPY CONVERTOR (FILTER) ×2 IMPLANT
GLOVE BIOGEL PI IND STRL 7.0 (GLOVE) ×2 IMPLANT
GLOVE BIOGEL PI INDICATOR 7.0 (GLOVE) ×2
GLOVE ECLIPSE 6.5 STRL STRAW (GLOVE) ×2 IMPLANT
GOWN STRL REUS W/TWL LRG LVL3 (GOWN DISPOSABLE) ×4 IMPLANT
PACK VAGINAL MINOR WOMEN LF (CUSTOM PROCEDURE TRAY) ×2 IMPLANT
PAD OB MATERNITY 4.3X12.25 (PERSONAL CARE ITEMS) ×2 IMPLANT
SEAL ROD LENS SCOPE MYOSURE (ABLATOR) ×2 IMPLANT
SYR 20CC LL (SYRINGE) IMPLANT
TOWEL OR 17X24 6PK STRL BLUE (TOWEL DISPOSABLE) ×4 IMPLANT
TUBING AQUILEX INFLOW (TUBING) ×2 IMPLANT
TUBING AQUILEX OUTFLOW (TUBING) ×2 IMPLANT

## 2016-12-08 NOTE — Anesthesia Preprocedure Evaluation (Signed)
Anesthesia Evaluation  Patient identified by MRN, date of birth, ID band Patient awake    Reviewed: Allergy & Precautions, H&P , NPO status , Patient's Chart, lab work & pertinent test results, reviewed documented beta blocker date and time   History of Anesthesia Complications (+) PROLONGED EMERGENCE and history of anesthetic complications  Airway Mallampati: II  TM Distance: >3 FB Neck ROM: full    Dental no notable dental hx.    Pulmonary neg pulmonary ROS, shortness of breath, asthma , pneumonia, resolved,    Pulmonary exam normal breath sounds clear to auscultation       Cardiovascular Exercise Tolerance: Good hypertension, Pt. on medications negative cardio ROS   Rhythm:regular Rate:Normal     Neuro/Psych negative neurological ROS  negative psych ROS   GI/Hepatic negative GI ROS, Neg liver ROS,   Endo/Other  negative endocrine ROSdiabetes, Type 1, Insulin Dependent  Renal/GU negative Renal ROS  negative genitourinary   Musculoskeletal   Abdominal   Peds  Hematology negative hematology ROS (+) anemia ,   Anesthesia Other Findings   Reproductive/Obstetrics negative OB ROS                             Anesthesia Physical Anesthesia Plan  ASA: III  Anesthesia Plan: General   Post-op Pain Management:    Induction: Intravenous  PONV Risk Score and Plan: 2 and Ondansetron, Dexamethasone, Treatment may vary due to age or medical condition and Midazolam  Airway Management Planned: LMA  Additional Equipment:   Intra-op Plan:   Post-operative Plan:   Informed Consent: I have reviewed the patients History and Physical, chart, labs and discussed the procedure including the risks, benefits and alternatives for the proposed anesthesia with the patient or authorized representative who has indicated his/her understanding and acceptance.   Dental Advisory Given  Plan Discussed  with: CRNA  Anesthesia Plan Comments: ( )        Anesthesia Quick Evaluation

## 2016-12-08 NOTE — H&P (View-Only) (Signed)
GYNECOLOGY  VISIT   HPI: 51 y.o.   Legally Separated  African American  female   [phone] with Patient's last menstrual period was 11/05/2016.   here for surgery consult for hysteroscopy, D&C, mirena IUD insertion.  She has been evaluated for AUB leading to anemia. She had an office D&C on 5/16 and was treated with provera. Biopsy returned with mostly blood and scant benign endometrium. She had a normal ultrasound and normal TSH. Her last hgb was a month ago and was 8.1 gm/dl. She has had an iron transfusion x 2. She is feeling better. The patient had a mirena IUD and expulsed it, likely a few years ago, that's when her bleeding got very bad. She was unable to tolerate IUD insertion in the office.   She has a h/o difficulty waking up after one of her surgeries and she would like to talk with anesthesia. That surgery was at [facility]. Next surgery was on her knee and she didn't have any issues.   The patient is c/o increase in hair growth on her chin, upper lip, side burns.  GYNECOLOGIC HISTORY: Patient's last menstrual period was 11/05/2016. Contraception:none  Menopausal hormone therapy: none        OB History    Gravida Para Term Preterm AB Living   '3 2     1 2   ' SAB TAB Ectopic Multiple Live Births   1                 Patient Active Problem List   Diagnosis Date Noted  . Unilateral primary osteoarthritis, left knee 11/05/2016  . Acute pain of left knee 10/14/2016  . Insulin dependent type 2 diabetes mellitus, uncontrolled ([place]) 12/19/2015  . Asthma, mild intermittent 12/19/2015  . Bilateral lower extremity edema 12/19/2015  . Rash and nonspecific skin eruption 10/19/2013  . DJD (degenerative joint disease) of knee 06/29/2013  . Unspecified constipation 05/08/2013  . Menorrhagia 03/30/2013  . Dysmenorrhea 03/30/2013  . Dyspnea 06/11/2010  . GRIEF REACTION 07/17/2009  . Edema 01/03/2009  . Morbid obesity ([place]) 12/21/2008  . Palpitations 12/21/2008  . GENITAL HERPES 03/22/2007   . HYPERLIPIDEMIA 03/22/2007  . Iron deficiency anemia 03/22/2007  . Anxiety disorder 03/22/2007  . DEPRESSION 03/22/2007  . Essential hypertension 03/22/2007  . ALLERGIC RHINITIS 03/22/2007  . [place] DISEASE, CERVICAL 03/22/2007  . POSITIVE PPD 03/22/2007    Past Medical History:  Diagnosis Date  . Allergic rhinitis   . Anemia   . Anxiety   . Asthma, mild intermittent 12/19/2015  . Depression   . Diabetes mellitus type 2 in obese ([place])   . Hyperlipidemia   . Hypertension   . Morbid obesity ([place])     Past Surgical History:  Procedure Laterality Date  . ABDOMINAL SURGERY     chronic abd wall abscess  . CESAREAN SECTION     x's 2  . CHOLECYSTECTOMY    . LIPOMA EXCISION  08/03/2011   Procedure: EXCISION LIPOMA;  Surgeon: Harl Bowie, MD;  Location: [facility];  Service: General;  Laterality: Right;  wide excision chronic abscess Right lower abdominal wall  . OOPHORECTOMY     R ovary removed at [facility]    Records from 08/14/09 surgery removed. She had an "attempted open laparoscopy, subsequent exploratory laparotomy with right oophorectomy, and umbilical hernia repair". She also had a D&C.  Pathology: right ovary-dermoid, EMC was benign.   Current Outpatient Prescriptions  Medication Sig Dispense Refill  .  acetaminophen-codeine (TYLENOL #3) 300-30 MG tablet Take 1-2 tablets by mouth every 8 (eight) hours as needed for moderate pain. 60 tablet 0  . albuterol (PROAIR HFA) 108 (90 Base) MCG/ACT inhaler Inhale 2 puffs into the lungs 4 (four) times daily. (Patient taking differently: Inhale 2 puffs into the lungs every 6 (six) hours as needed for wheezing or shortness of breath. ) 18 g 3  . atorvastatin (LIPITOR) 10 MG tablet TAKE 1 TABLET BY MOUTH EVERY DAY 90 tablet 0  . BAYER MICROLET LANCETS lancets Use as instructed to check sugar up to 6 times daily 600 each 5  . betamethasone valerate ointment (VALISONE) 0.1 % Apply 1 application topically daily as  needed. ECZEMA  0  . Blood Glucose Monitoring Suppl (BAYER CONTOUR MONITOR) w/Device KIT Use to check sugar daily 1 each 0  . canagliflozin (INVOKANA) 300 MG TABS tablet Take 1 tablet (300 mg total) by mouth daily. 90 tablet 3  . citalopram (CELEXA) 40 MG tablet TAKE 1 TABLET DAILY 90 tablet 0  . clobetasol cream (TEMOVATE) 0.98 % Apply 1 application topically 2 (two) times daily. Up to 2 weeks at the time (Patient taking differently: Apply 1 application topically 2 (two) times daily as needed (eczema). Up to 2 weeks at the time) 45 g 1  . Famotidine (PEPCID AC PO) Take 1 tablet by mouth every 6 (six) hours as needed (heartburn).    . fluticasone (FLONASE) 50 MCG/ACT nasal spray PLACE 2 SPRAYS INTO BOTH NOSTRILS DAILY. (Patient taking differently: Place 2 sprays into both nostrils daily as needed for allergies or rhinitis. ) 16 g 3  . fluticasone (FLOVENT HFA) 44 MCG/ACT inhaler Inhale 1 puff into the lungs 2 (two) times daily as needed (shortness of breath).    . furosemide (LASIX) 20 MG tablet Take 20 mg by mouth 2 (two) times daily.    Marland Kitchen glucose blood (BAYER CONTOUR TEST) test strip Use as instructed to check sugar 6 times daily 600 each 5  . hydroquinone 4 % cream APPLY TO AFFECTED AREA TWICE A DAY (Patient taking differently: Apply 1 application topically 2 (two) times daily as needed. APPLY TO AFFECTED AREA TWICE A DAY) 28.35 g 2  . hydrOXYzine (ATARAX/VISTARIL) 25 MG tablet Take 12.5 mg by mouth daily as needed for anxiety.  10  . ibuprofen (ADVIL,MOTRIN) 600 MG tablet TAKE 1 TABLET BY MOUTH EVERY 8 HOURS AS NEEDED (Patient taking differently: TAKE 1 TABLET BY MOUTH EVERY 8 HOURS AS NEEDED FOR PAIN) 60 tablet 0  . insulin aspart (NOVOLOG FLEXPEN) 100 UNIT/ML FlexPen INJECT 25 UNITS INTO THE SKIN 3 (THREE) TIMES DAILY 15 min before MEALS. 90 mL 1  . Insulin Glargine (TOUJEO SOLOSTAR) 300 UNIT/ML SOPN Inject 75 Units into the skin daily. 21 pen 1  . Insulin Pen Needle 31G X 5 MM MISC Use 4x a day  400 each 3  . metFORMIN (GLUCOPHAGE-XR) 500 MG 24 hr tablet Take 2 tablets (1,000 mg total) by mouth 2 (two) times daily. 360 tablet 3  . metoprolol succinate (TOPROL XL) 25 MG 24 hr tablet Take one tablet by mouth daily.( Take with 50 mg to total 75 mg daily.) 90 tablet 3  . metoprolol succinate (TOPROL-XL) 50 MG 24 hr tablet Take one tablet by mouth daily (Take with 25 mg tablet to total 75 mg daily) 90 tablet 3  . Multiple Vitamins-Minerals (MULTIVITAMIN WITH MINERALS) tablet Take 1 tablet by mouth daily.    Marland Kitchen NOVOLOG FLEXPEN 100 UNIT/ML FlexPen  INJECT 25 UNITS INTO THE SKIN 3 (THREE) TIMES DAILY WITH MEALS. 90 mL 1  . valsartan (DIOVAN) 320 MG tablet TAKE 1 TABLET (320 MG TOTAL) BY MOUTH DAILY. 90 tablet 0   No current facility-administered medications for this visit.      ALLERGIES: Ramipril  Family History  Problem Relation Age of Onset  . Diabetes Mother   . Hypertension Mother   . Other Mother        renal failure  . Diabetes Other   . Hypertension Father   . Cancer Father     Social History   Social History  . Marital status: Legally Separated    Spouse name: N/A  . Number of children: N/A  . Years of education: N/A   Occupational History  . Not on file.   Social History Main Topics  . Smoking status: Never Smoker  . Smokeless tobacco: Never Used     Comment: Married  . Alcohol use No  . Drug use: No  . Sexual activity: Not Currently    Partners: Male    Birth control/ protection: None   Other Topics Concern  . Not on file   Social History Narrative  . No narrative on file    Review of Systems  Constitutional: Negative.   HENT: Negative.   Eyes: Negative.   Respiratory: Negative.   Cardiovascular: Negative.   Gastrointestinal: Negative.   Genitourinary: Negative.   Musculoskeletal: Negative.   Skin: Negative.   Neurological: Negative.   Endo/Heme/Allergies: Negative.   Psychiatric/Behavioral: Negative.     PHYSICAL EXAMINATION:    BP  108/64 (BP Location: Right Wrist, Patient Position: Sitting, Cuff Size: Normal)   Pulse 84   Resp 18   Wt 291 lb (132 kg)   LMP 11/05/2016   BMI 48.42 kg/m     General appearance: alert, cooperative and appears stated age Neck: no adenopathy, supple, symmetrical, trachea midline and thyroid normal to inspection and palpation  Heart: regular rate and rhythm Lungs: CTAB Abdomen: soft, non-tender; bowel sounds normal; no masses,  no organomegaly Extremities: normal, atraumatic, no cyanosis Skin: normal color, texture and turgor, no rashes or lesions Lymph: normal cervical supraclavicular and inguinal nodes Neurologic: grossly normal    ASSESSMENT Abnormal uterine bleeding Anemia Contraception H/O diabetes, hypertension, elevated lipids, asthma and mild sleep apnea BP low on anti-hypertensive medications New onset hirsutism   PLAN Plan: hysteroscopy, dilation and curettage, mirena IUD insertion. Reviewed risks, including: bleeding, infection, uterine perforation, fluid overload, need for further surgery Will need an pre-op anesthesia consultation given medical history and prior difficulty with "waking up from surgery" She needs to f/u with Cardiology in regards to her antihypertensive medications Needs to be first case of the day because of her diabetes  Check hirsutism labs    An After Visit Summary was printed and given to the patient.

## 2016-12-08 NOTE — Patient Instructions (Signed)
A few things to remember from today's visit:   Preoperative clearance - Plan: EKG 12-Lead  Insulin dependent type 2 diabetes mellitus, uncontrolled (HCC)  Essential hypertension  DUB (dysfunctional uterine bleeding)   DM II and HTN well controlled. EKG today SR,normal axis, LAE. No changes when compared with EKG in 2010. HypoK+, recommend chocking K+ levels prior to procedure.  Stop Metformin 24 hours prior and resume 24-36 hours after if no complications. Take BP medications. Monitor BS's closely. Adequate hydration. Monitor BP. She has had difficulty coming out from anesthesia in prior surgery (ophoorectomy) but no complications with last one in 2015 (knee arthroscopy).  Please be sure medication list is accurate. If a new problem present, please set up appointment sooner than planned today.

## 2016-12-08 NOTE — Transfer of Care (Signed)
Immediate Anesthesia Transfer of Care Note  Patient: Heather Myers  Procedure(s) Performed: Procedure(s): DILATATION & CURETTAGE/HYSTEROSCOPY WITH MYOSURE (N/A) MIRENA INTRAUTERINE DEVICE (IUD) INSERTION (IUD FROM OFFICE) (N/A)  Patient Location: PACU  Anesthesia Type:General  Level of Consciousness: awake, alert  and oriented  Airway & Oxygen Therapy: Patient Spontanous Breathing and Patient connected to nasal cannula oxygen  Post-op Assessment: Report given to RN and Post -op Vital signs reviewed and stable  Post vital signs: Reviewed and stable  Last Vitals:  Vitals:   12/08/16 1047  BP: (!) 126/54  Pulse: 71  Resp: 16  Temp: 36.7 C  SpO2: 100%    Last Pain:  Vitals:   12/08/16 1047  TempSrc: Oral      Patients Stated Pain Goal: 2 (12/08/16 1047)  Complications: No apparent anesthesia complications

## 2016-12-08 NOTE — Telephone Encounter (Signed)
Dr.Jordan's office called stating it is okay to proceed with surgery. Message left on [facility] voicemail.

## 2016-12-08 NOTE — Discharge Instructions (Signed)

## 2016-12-08 NOTE — Op Note (Addendum)
Preoperative Diagnosis: Abnormal uterine bleeding, anemia, contraception  Postoperative Diagnosis: Same  Procedure: Hysteroscopy, dilation and curettage Addendum: and mirena IUD insertion  Surgeon: Dr Gertie Exon  Assistants: None  Anesthesia: General via LMA  EBL: 10 cc  Fluids: 700 cc LR  Fluid deficit: 315 cc  Urine output: not recorded  Indications for surgery: The patient is a 51 yo female, who presented with abnormal uterine bleeding leading to anemia. Work up included a hgb of 8.1, a normal TSH and a normal pelvic ultrasound.   The risks of the surgery were reviewed with the patient and the consent form was signed prior to her surgery.  Findings: EUA: normal sized uterus, no adnexal masses. Hysteroscopy: slightly polypoid appearing endometrium, normal tubal ostia bilaterally  Specimens: Endometrial curettage   Procedure: The patient was taken to the operating room with an IV in place. She was placed in the dorsal lithotomy position and anesthesia was administered. She was prepped and draped in the usual sterile fashion for a vaginal procedure. She voided on the way to the OR. A weighted speculum was placed in the vagina and a single tooth tenaculum was placed on the anterior lip of the cervix. The cervix was dilated to a #7 hagar dilator. The uterus was sounded to 10 cm. The myosure hysteroscope was inserted into the uterine cavity. With continuous infusion of normal saline, the uterine cavity was visualized with the above findings. The myosure reach was used to remove the polypoid endometrium. The myosure was then removed. The cavity was then curetted with the small sharp curette. The cavity had the characteristically gritty texture at the end of the procedure. The curette was removed. The mirena IUD was then inserted without difficulty. The single tooth tenaculum was removed. Oozing from the tenaculum site was stopped with pressure. The speculum was removed. The patients  perineum was cleansed of betadine and she was taken out of the dorsal lithotomy position.   Upon awakening the LMA was removed and the patient was transferred to the recovery room in stable and awake condition.  The sponge and instrument count were correct. There were no complications.

## 2016-12-08 NOTE — Addendum Note (Signed)
Addended by: Roddie Mc A on: 12/08/2016 07:01 AM   Modules accepted: Level of Service

## 2016-12-08 NOTE — Telephone Encounter (Signed)
Call to Sarah at Dr Elvis Coil office. Verbal report received that patient is cleared for surgery. Dr Elvis Coil office note is not complete so they will fax AVS to our office. . Phone report to OR desk and will send AVS report to their fax [phone].

## 2016-12-08 NOTE — Interval H&P Note (Signed)
History and Physical Interval Note:  12/08/2016 11:45 AM  Heather Myers  has presented today for surgery, with the diagnosis of menometorrhagia  The various methods of treatment have been discussed with the patient and family. After consideration of risks, benefits and other options for treatment, the patient has consented to  Procedure(s): DILATATION & CURETTAGE/HYSTEROSCOPY WITH MYOSURE (N/A) MIRENA INTRAUTERINE DEVICE (IUD) INSERTION (IUD FROM OFFICE) (N/A) as a surgical intervention .  The patient's history has been reviewed, patient examined, no change in status, stable for surgery.  I have reviewed the patient's chart and labs.  Questions were answered to the patient's satisfaction.   The patient has been medically cleared by Dr [place].   Romualdo Bolk

## 2016-12-08 NOTE — Progress Notes (Signed)
Scheduling error; patient is scheduled to follow up with PCP 12/08/16 for surgical clearance visit.  12/04/16 documentation stated that patient needed a CPE and was scheduled for an appointment on 12/07/16 at 11:30am.  Scheduling error occurred and this appointment was changed to 1:45 pm. After review of chart and discussion with patient regarding error with scheduling and needed documentation prior to Dilatation and Curretage/hysteroscopy patient will see PCP for clearance on 12/08/16.

## 2016-12-08 NOTE — Anesthesia Postprocedure Evaluation (Signed)
Anesthesia Post Note  Patient: MAELIE CHRISWELL  Procedure(s) Performed: Procedure(s) (LRB): DILATATION & CURETTAGE/HYSTEROSCOPY WITH MYOSURE (N/A) MIRENA INTRAUTERINE DEVICE (IUD) INSERTION (IUD FROM OFFICE) (N/A)     Patient location during evaluation: PACU Anesthesia Type: General Level of consciousness: awake and alert Pain management: pain level controlled Vital Signs Assessment: post-procedure vital signs reviewed and stable Respiratory status: spontaneous breathing, nonlabored ventilation, respiratory function stable and patient connected to nasal cannula oxygen Cardiovascular status: blood pressure returned to baseline and stable Postop Assessment: no signs of nausea or vomiting Anesthetic complications: no    Last Vitals:  Vitals:   12/08/16 1256 12/08/16 1300  BP: (!) 150/78 137/73  Pulse: 68 69  Resp: 20 16  Temp: 36.9 C   SpO2: 95% 92%    Last Pain:  Vitals:   12/08/16 1300  TempSrc:   PainSc: 4    Pain Goal: Patients Stated Pain Goal: 2 (12/08/16 1047)               Usiel Astarita

## 2016-12-08 NOTE — Telephone Encounter (Signed)
Left voicemail for Kennon Rounds at Dr. Salli Quarry office letting her know that patient is cleared for surgery. I advised that we do not have her office note finished, but that I can fax over her AVS if needed, Dr. Swaziland had put most of the notes onto that. Advised to call back with any questions and/or if they need the AVS faxed over.

## 2016-12-08 NOTE — OR Nursing (Signed)
IUD LOT #[id number] [facility] [id number]

## 2016-12-08 NOTE — Telephone Encounter (Signed)
AVS and telephone note from Raini Tiley at Dr Elvis Coil office stating cleared for surgery faxed to [facility] OR desk, [phone].  Routing to provider for final review. Will close encounter.

## 2016-12-09 ENCOUNTER — Encounter (HOSPITAL_COMMUNITY): Payer: Self-pay | Admitting: Obstetrics and Gynecology

## 2016-12-15 ENCOUNTER — Encounter: Payer: Self-pay | Admitting: Internal Medicine

## 2016-12-15 ENCOUNTER — Ambulatory Visit: Payer: BLUE CROSS/BLUE SHIELD | Admitting: Internal Medicine

## 2016-12-20 ENCOUNTER — Other Ambulatory Visit: Payer: Self-pay | Admitting: Family Medicine

## 2016-12-20 DIAGNOSIS — E1165 Type 2 diabetes mellitus with hyperglycemia: Secondary | ICD-10-CM

## 2016-12-20 DIAGNOSIS — Z794 Long term (current) use of insulin: Principal | ICD-10-CM

## 2016-12-20 DIAGNOSIS — IMO0002 Reserved for concepts with insufficient information to code with codable children: Secondary | ICD-10-CM

## 2016-12-22 ENCOUNTER — Telehealth: Payer: Self-pay | Admitting: Internal Medicine

## 2016-12-22 ENCOUNTER — Ambulatory Visit (INDEPENDENT_AMBULATORY_CARE_PROVIDER_SITE_OTHER): Payer: BLUE CROSS/BLUE SHIELD | Admitting: Orthopaedic Surgery

## 2016-12-22 NOTE — Telephone Encounter (Signed)
Patient dismissed from [facility] by Carlus Pavlov MD , effective December 15 2016. Dismissal letter sent out by certified / registered mail.  daj

## 2016-12-23 ENCOUNTER — Ambulatory Visit: Payer: Self-pay | Admitting: Obstetrics and Gynecology

## 2016-12-23 ENCOUNTER — Encounter: Payer: Self-pay | Admitting: Obstetrics and Gynecology

## 2016-12-23 ENCOUNTER — Telehealth: Payer: Self-pay | Admitting: Obstetrics and Gynecology

## 2016-12-23 NOTE — Telephone Encounter (Signed)
Heather Myers, this patient has had abnormal uterine bleeding and anemia. She didn't come for her post op check, s/p hysteroscopy, D&C, mirena IUD insertion. In the past she expulsed an IUD. I think it is important that she returns for f/u. Can you please figure out what the obstacles may be. Thanks!

## 2016-12-23 NOTE — Telephone Encounter (Signed)
Patient left voicemail that she needs to cancel her 2:30 pm post-op appointment for today.  I called patient to attempt to reschedule and she declined appointments offered.  CC: Heather Myers

## 2016-12-24 NOTE — Telephone Encounter (Signed)
Call to patient. Unable to leave message due to voice mail box is full.

## 2016-12-25 ENCOUNTER — Other Ambulatory Visit: Payer: Self-pay | Admitting: Family Medicine

## 2016-12-25 DIAGNOSIS — Z760 Encounter for issue of repeat prescription: Secondary | ICD-10-CM

## 2016-12-25 NOTE — Telephone Encounter (Signed)
Call to patient regarding missed post-op appointment. Left message to call back to [place] or [place].

## 2016-12-29 NOTE — Telephone Encounter (Signed)
Follow-up call to patient. Left message to cal back to sally or triage nurse regarding missed post op appointment.

## 2016-12-30 NOTE — Telephone Encounter (Signed)
Received signed domestic return receipt verifying delivery of certified letter on December 25, 2016. Article number [id number] daj

## 2016-12-31 NOTE — Telephone Encounter (Signed)
Call to patient. 3rd message left for patient regarding post op appointment. Left message that Dr Oscar La really encourages patient to return for follow-up. Call back to office and we will assist with appointment. Heather Myers

## 2017-01-01 NOTE — Telephone Encounter (Signed)
Call to patient. Left message to call back and that will try again in a few moments.

## 2017-01-01 NOTE — Telephone Encounter (Signed)
Patient returning your call.  Says when you call her back if she does not answer to please call a second time that she is in a loud building.

## 2017-01-04 NOTE — Telephone Encounter (Signed)
Call to patient. Left message to call back to reschedule post op. Can speak to triage nurse if I am not available.

## 2017-01-04 NOTE — Telephone Encounter (Signed)
2nd attempt to call patient. Left message to call back. 

## 2017-01-06 ENCOUNTER — Ambulatory Visit (INDEPENDENT_AMBULATORY_CARE_PROVIDER_SITE_OTHER): Payer: BLUE CROSS/BLUE SHIELD | Admitting: Orthopaedic Surgery

## 2017-01-06 DIAGNOSIS — M1712 Unilateral primary osteoarthritis, left knee: Secondary | ICD-10-CM

## 2017-01-06 MED ORDER — HYLAN G-F 20 48 MG/6ML IX SOSY
48.0000 mg | PREFILLED_SYRINGE | INTRA_ARTICULAR | Status: AC | PRN
  Administered 2017-01-06: 48 mg via INTRA_ARTICULAR

## 2017-01-06 NOTE — Progress Notes (Signed)
   Procedure Note  Patient: MARGURETTE BRENER             Date of Birth: 1965-06-17           MRN: [id number]             Visit Date: 01/06/2017  Procedures: Visit Diagnoses: Unilateral primary osteoarthritis, left knee  Large Joint Inj Date/Time: 01/06/2017 11:28 AM Performed by: Kathryne Hitch Authorized by: Kathryne Hitch   Location:  Knee Site:  L knee Ultrasound Guidance: No   Fluoroscopic Guidance: No   Arthrogram: No   Medications:  48 mg Hylan 48 MG/6ML   The patient is here for known scheduled hyaluronic acid injection with Synvisc 1 and her left knee to treat painful osteoarthritis. She has tolerated a steroid injection before we recommended this injection for her to treat her arthritis pain. Also due to the fact she is only 51 years old and is morbidly obese. Her hope this will help her as does she. She understands fully the risks and benefits of this injection. We cleaned her knee better alcohol and provided injection without difficulty. She'll follow-up as needed but understands in the winter we can always try steroid injection again if needed. She'll try to work on weight loss and activity modification as well as strengthening her quad muscles.

## 2017-01-14 ENCOUNTER — Encounter: Payer: Self-pay | Admitting: Family Medicine

## 2017-01-15 NOTE — Telephone Encounter (Signed)
Return call from patient. States she has been traveling and has been out of town for 2 weeks. Now able to reschedule post op. States she is doing very will. Appointment scheduled for 01-26-17. Patient declines earlier appointment due to work limitations.   Routing to provider for final review. Patient agreeable to disposition. Will close encounter.

## 2017-01-26 ENCOUNTER — Encounter: Payer: Self-pay | Admitting: Obstetrics and Gynecology

## 2017-01-26 ENCOUNTER — Ambulatory Visit (INDEPENDENT_AMBULATORY_CARE_PROVIDER_SITE_OTHER): Payer: BLUE CROSS/BLUE SHIELD | Admitting: Obstetrics and Gynecology

## 2017-01-26 VITALS — BP 112/60 | HR 84 | Resp 20 | Wt 296.0 lb

## 2017-01-26 DIAGNOSIS — Z862 Personal history of diseases of the blood and blood-forming organs and certain disorders involving the immune mechanism: Secondary | ICD-10-CM | POA: Diagnosis not present

## 2017-01-26 DIAGNOSIS — Z30431 Encounter for routine checking of intrauterine contraceptive device: Secondary | ICD-10-CM

## 2017-01-26 DIAGNOSIS — Z9889 Other specified postprocedural states: Secondary | ICD-10-CM

## 2017-01-26 NOTE — Progress Notes (Signed)
GYNECOLOGY  VISIT   HPI: 51 y.o.   Legally Separated  African American  female   [phone] with Patient's last menstrual period was 11/01/2016.   here for follow up. Patient is 6 weeks s/p D&C hysteroscopy, mirena IUD insertion. Surgery was performend secondary to menorrhagia leading to anemia. Pathology was benign.  She has only had 2 days of spotting since the IUD was placed. She is feeling so much better. Less tired, sleeping better, more energy.   GYNECOLOGIC HISTORY: Patient's last menstrual period was 11/01/2016. Contraception:IUD Menopausal hormone therapy: none         OB History    Gravida Para Term Preterm AB Living   _0 SAB TAB Ectopic Multiple Live Births   1                 Patient Active Problem List   Diagnosis Date Noted  . Unilateral primary osteoarthritis, left knee 11/05/2016  . Acute pain of left knee 10/14/2016  . Insulin dependent type 2 diabetes mellitus, uncontrolled ([place]) 12/19/2015  . Asthma, mild intermittent 12/19/2015  . Bilateral lower extremity edema 12/19/2015  . Rash and nonspecific skin eruption 10/19/2013  . DJD (degenerative joint disease) of knee 06/29/2013  . Unspecified constipation 05/08/2013  . Menorrhagia 03/30/2013  . Dysmenorrhea 03/30/2013  . Dyspnea 06/11/2010  . GRIEF REACTION 07/17/2009  . Edema 01/03/2009  . Morbid obesity ([place]) 12/21/2008  . Palpitations 12/21/2008  . GENITAL HERPES 03/22/2007  . HYPERLIPIDEMIA 03/22/2007  . Iron deficiency anemia 03/22/2007  . Anxiety disorder 03/22/2007  . DEPRESSION 03/22/2007  . Essential hypertension 03/22/2007  . ALLERGIC RHINITIS 03/22/2007  . [place] DISEASE, CERVICAL 03/22/2007  . POSITIVE PPD 03/22/2007    Past Medical History:  Diagnosis Date  . Allergic rhinitis   . Anemia   . Anxiety   . Arthritis   . Asthma, mild intermittent 12/19/2015  . Complication of anesthesia    difficulty waking up from anesthesia  . Depression   . Diabetes mellitus type 2 in obese  ([place])   . Hyperlipidemia   . Hypertension   . Low blood pressure reading    noticed lately  . Morbid obesity ([place])   . Pneumonia    history of  . Sleep apnea    mild sleep, no CPAP needed at this time    Past Surgical History:  Procedure Laterality Date  . ABDOMINAL SURGERY     chronic abd wall abscess  . CESAREAN SECTION     x's 2  . CHOLECYSTECTOMY    . DILATATION & CURETTAGE/HYSTEROSCOPY WITH MYOSURE N/A 12/08/2016   Procedure: DILATATION & CURETTAGE/HYSTEROSCOPY WITH MYOSURE;  Surgeon: Salvadore Dom, MD;  Location: [facility];  Service: Gynecology;  Laterality: N/A;  . INTRAUTERINE DEVICE (IUD) INSERTION N/A 12/08/2016   Procedure: MIRENA INTRAUTERINE DEVICE (IUD) INSERTION (IUD FROM OFFICE);  Surgeon: Salvadore Dom, MD;  Location: [facility];  Service: Gynecology;  Laterality: N/A;  . KNEE ARTHROSCOPY Right   . LIPOMA EXCISION  08/03/2011   Procedure: EXCISION LIPOMA;  Surgeon: Harl Bowie, MD;  Location: [facility];  Service: General;  Laterality: Right;  wide excision chronic abscess Right lower abdominal wall  . OOPHORECTOMY     R [facility] at [facility]      Current Outpatient Prescriptions  Medication Sig Dispense Refill  . albuterol (PROAIR HFA) 108 (90 Base) MCG/ACT inhaler Inhale 2 puffs into the lungs  4 (four) times daily. (Patient taking differently: Inhale 2 puffs into the lungs every 6 (six) hours as needed for wheezing or shortness of breath. ) 18 g 3  . atorvastatin (LIPITOR) 10 MG tablet TAKE 1 TABLET BY MOUTH EVERY DAY 90 tablet 0  . BAYER MICROLET LANCETS lancets Use as instructed to check sugar up to 6 times daily 600 each 5  . betamethasone valerate ointment (VALISONE) 0.1 % Apply 1 application topically daily as needed. ECZEMA  0  . Blood Glucose Monitoring Suppl (BAYER CONTOUR MONITOR) w/Device KIT Use to check sugar daily 1 each 0  . canagliflozin (INVOKANA) 300 MG TABS tablet Take 1 tablet (300 mg total) by mouth daily.  90 tablet 3  . citalopram (CELEXA) 40 MG tablet TAKE 1 TABLET DAILY 90 tablet 0  . clobetasol cream (TEMOVATE) 7.12 % Apply 1 application topically 2 (two) times daily. Up to 2 weeks at the time (Patient taking differently: Apply 1 application topically 2 (two) times daily as needed (eczema). Up to 2 weeks at the time) 45 g 1  . doxycycline (VIBRA-TABS) 100 MG tablet Take 1 tablet (100 mg total) by mouth 2 (two) times daily. 20 tablet 0  . Famotidine (PEPCID AC PO) Take 1 tablet by mouth every 6 (six) hours as needed (heartburn).    . fluticasone (FLONASE) 50 MCG/ACT nasal spray PLACE 2 SPRAYS INTO BOTH NOSTRILS DAILY. (Patient taking differently: Place 2 sprays into both nostrils daily as needed for allergies or rhinitis. ) 16 g 3  . fluticasone (FLOVENT HFA) 44 MCG/ACT inhaler Inhale 1 puff into the lungs 2 (two) times daily as needed (shortness of breath).    . furosemide (LASIX) 20 MG tablet Take 20 mg by mouth 2 (two) times daily.    Marland Kitchen glucose blood (BAYER CONTOUR TEST) test strip Use as instructed to check sugar 6 times daily 600 each 5  . hydroquinone 4 % cream APPLY TO AFFECTED AREA TWICE A DAY (Patient taking differently: Apply 1 application topically 2 (two) times daily as needed. APPLY TO AFFECTED AREA TWICE A DAY) 28.35 g 2  . hydrOXYzine (ATARAX/VISTARIL) 25 MG tablet Take 12.5 mg by mouth daily as needed for anxiety.  10  . ibuprofen (ADVIL,MOTRIN) 600 MG tablet TAKE 1 TABLET BY MOUTH EVERY 8 HOURS AS NEEDED (Patient taking differently: TAKE 1 TABLET BY MOUTH EVERY 8 HOURS AS NEEDED FOR PAIN) 60 tablet 0  . insulin aspart (NOVOLOG FLEXPEN) 100 UNIT/ML FlexPen INJECT 25 UNITS INTO THE SKIN 3 (THREE) TIMES DAILY 15 min before MEALS. 90 mL 1  . Insulin Glargine (TOUJEO SOLOSTAR) 300 UNIT/ML SOPN Inject 75 Units into the skin daily. 21 pen 1  . Insulin Pen Needle 31G X 5 MM MISC Use 4x a day 400 each 3  . metFORMIN (GLUCOPHAGE-XR) 500 MG 24 hr tablet TAKE 2 TABLETS (1,000 MG TOTAL) BY MOUTH  2 (TWO) TIMES DAILY. 360 tablet 0  . metoprolol succinate (TOPROL XL) 25 MG 24 hr tablet Take one tablet by mouth daily.( Take with 50 mg to total 75 mg daily.) 90 tablet 3  . metoprolol succinate (TOPROL-XL) 50 MG 24 hr tablet Take one tablet by mouth daily (Take with 25 mg tablet to total 75 mg daily) 90 tablet 3  . Multiple Vitamins-Minerals (MULTIVITAMIN WITH MINERALS) tablet Take 1 tablet by mouth daily.    Marland Kitchen NOVOLOG FLEXPEN 100 UNIT/ML FlexPen INJECT 25 UNITS INTO THE SKIN 3 (THREE) TIMES DAILY WITH MEALS. 90 mL 1  .  valsartan (DIOVAN) 320 MG tablet TAKE 1 TABLET (320 MG TOTAL) BY MOUTH DAILY. 90 tablet 0   No current facility-administered medications for this visit.      ALLERGIES: Ramipril  Family History  Problem Relation Age of Onset  . Diabetes Mother   . Hypertension Mother   . Other Mother        renal failure  . Diabetes Other   . Hypertension Father   . Cancer Father     Social History   Social History  . Marital status: Legally Separated    Spouse name: N/A  . Number of children: N/A  . Years of education: N/A   Occupational History  . Not on file.   Social History Main Topics  . Smoking status: Never Smoker  . Smokeless tobacco: Never Used     Comment: Married  . Alcohol use Yes     Comment: rare  . Drug use: No  . Sexual activity: Not Currently    Partners: Male    Birth control/ protection: None   Other Topics Concern  . Not on file   Social History Narrative  . No narrative on file    Review of Systems  Constitutional: Negative.   HENT: Negative.   Eyes: Negative.   Respiratory: Negative.   Cardiovascular: Negative.   Gastrointestinal: Negative.   Genitourinary: Negative.   Musculoskeletal: Negative.   Skin: Negative.   Neurological: Negative.   Endo/Heme/Allergies: Negative.   Psychiatric/Behavioral: Negative.     PHYSICAL EXAMINATION:    BP 112/60 (BP Location: Right Wrist, Patient Position: Sitting, Cuff Size: Normal)    Pulse 84   Resp 20   Wt 296 lb (134.3 kg)   LMP 11/01/2016   BMI 49.26 kg/m     General appearance: alert, cooperative and appears stated age  Pelvic: External genitalia:  no lesions              Urethra:  normal appearing urethra with no masses, tenderness or lesions              Bartholins and Skenes: normal                 Vagina: normal appearing vagina with normal color and discharge, no lesions              Cervix: no lesions and IUD strings 4-5 cm              Bimanual Exam:  Uterus:  normal size, contour, position, consistency, mobility, non-tender              Adnexa: no mass, fullness, tenderness                Chaperone was present for exam.  ASSESSMENT S/P hysteroscopy, D&C, mirena IUD insertion. Pathology benign, doing so much better since the IUD was inserted H/O anemia  PLAN CBC, Ferritin F/U for an annual exam in 6 months Mammogram overdue, # given   An After Visit Summary was printed and given to the patient.

## 2017-01-27 LAB — CBC
HEMATOCRIT: 36.8 % (ref 34.0–46.6)
Hemoglobin: 12.1 g/dL (ref 11.1–15.9)
MCH: 27.3 pg (ref 26.6–33.0)
MCHC: 32.9 g/dL (ref 31.5–35.7)
MCV: 83 fL (ref 79–97)
Platelets: 495 10*3/uL — ABNORMAL HIGH (ref 150–379)
RBC: 4.44 x10E6/uL (ref 3.77–5.28)
RDW: 16.6 % — AB (ref 12.3–15.4)
WBC: 10.4 10*3/uL (ref 3.4–10.8)

## 2017-01-27 LAB — FERRITIN: Ferritin: 10 ng/mL — ABNORMAL LOW (ref 15–150)

## 2017-02-03 ENCOUNTER — Other Ambulatory Visit: Payer: Self-pay | Admitting: Family Medicine

## 2017-02-03 DIAGNOSIS — L309 Dermatitis, unspecified: Secondary | ICD-10-CM | POA: Diagnosis not present

## 2017-02-03 DIAGNOSIS — F419 Anxiety disorder, unspecified: Secondary | ICD-10-CM

## 2017-02-03 DIAGNOSIS — L680 Hirsutism: Secondary | ICD-10-CM | POA: Diagnosis not present

## 2017-02-04 NOTE — Progress Notes (Signed)
Cardiology Office Note    Date:  02/05/2017   ID:  Heather Myers, DOB 12-04-1965, MRN [id number]  PCP:  Martinique, Betty G, MD  Cardiologist: Sinclair Grooms, MD   No chief complaint on file.   History of Present Illness:  Heather Myers is a 51 y.o. female with history of hypertension, recent fluid retention, dyspnea on exertion, diabetes, morbid obesity, family history of kidney failure, and prior diagnosis of mild obstructive sleep apnea not requiring C Pap (51 year old information).Heather Myers feels well. Swelling and dyspnea present on the last office visit have resolves and medication adjustments. She now has a request from dermatology to employee spironolactone in her regimen as a means to control hair loss and acne. She denies chest pain. She is not short of breath. Lower extremity edema has not transiently recurred. She has no limitations with activity.   Past Medical History:  Diagnosis Date  . Allergic rhinitis   . Anemia   . Anxiety   . Arthritis   . Asthma, mild intermittent 12/19/2015  . Complication of anesthesia    difficulty waking up from anesthesia  . Depression   . Diabetes mellitus type 2 in obese ([place])   . Hyperlipidemia   . Hypertension   . Low blood pressure reading    noticed lately  . Morbid obesity ([place])   . Pneumonia    history of  . Sleep apnea    mild sleep, no CPAP needed at this time    Past Surgical History:  Procedure Laterality Date  . ABDOMINAL SURGERY     chronic abd wall abscess  . CESAREAN SECTION     x's 2  . CHOLECYSTECTOMY    . DILATATION & CURETTAGE/HYSTEROSCOPY WITH MYOSURE N/A 12/08/2016   Procedure: DILATATION & CURETTAGE/HYSTEROSCOPY WITH MYOSURE;  Surgeon: Salvadore Dom, MD;  Location: [facility];  Service: Gynecology;  Laterality: N/A;  . INTRAUTERINE DEVICE (IUD) INSERTION N/A 12/08/2016   Procedure: MIRENA INTRAUTERINE DEVICE (IUD) INSERTION (IUD FROM OFFICE);  Surgeon: Salvadore Dom, MD;   Location: [facility];  Service: Gynecology;  Laterality: N/A;  . KNEE ARTHROSCOPY Right   . LIPOMA EXCISION  08/03/2011   Procedure: EXCISION LIPOMA;  Surgeon: Harl Bowie, MD;  Location: [facility];  Service: General;  Laterality: Right;  wide excision chronic abscess Right lower abdominal wall  . OOPHORECTOMY     R [facility]      Current Medications: Outpatient Medications Prior to Visit  Medication Sig Dispense Refill  . atorvastatin (LIPITOR) 10 MG tablet TAKE 1 TABLET BY MOUTH EVERY DAY 90 tablet 0  . BAYER MICROLET LANCETS lancets Use as instructed to check sugar up to 6 times daily 600 each 5  . betamethasone valerate ointment (VALISONE) 0.1 % Apply 1 application topically daily as needed. ECZEMA  0  . Blood Glucose Monitoring Suppl (BAYER CONTOUR MONITOR) w/Device KIT Use to check sugar daily 1 each 0  . canagliflozin (INVOKANA) 300 MG TABS tablet Take 1 tablet (300 mg total) by mouth daily. 90 tablet 3  . citalopram (CELEXA) 40 MG tablet TAKE 1 TABLET BY MOUTH EVERY DAY 90 tablet 0  . Famotidine (PEPCID AC PO) Take 1 tablet by mouth every 6 (six) hours as needed (heartburn).    . fluticasone (FLOVENT HFA) 44 MCG/ACT inhaler Inhale 1 puff into the lungs 2 (two) times daily as needed (shortness of breath).    Marland Kitchen  glucose blood (BAYER CONTOUR TEST) test strip Use as instructed to check sugar 6 times daily 600 each 5  . hydrOXYzine (ATARAX/VISTARIL) 25 MG tablet Take 12.5 mg by mouth daily as needed for anxiety.  10  . insulin aspart (NOVOLOG FLEXPEN) 100 UNIT/ML FlexPen INJECT 25 UNITS INTO THE SKIN 3 (THREE) TIMES DAILY 15 min before MEALS. 90 mL 1  . Insulin Glargine (TOUJEO SOLOSTAR) 300 UNIT/ML SOPN Inject 75 Units into the skin daily. 21 pen 1  . Insulin Pen Needle 31G X 5 MM MISC Use 4x a day 400 each 3  . metFORMIN (GLUCOPHAGE-XR) 500 MG 24 hr tablet TAKE 2 TABLETS (1,000 MG TOTAL) BY MOUTH 2 (TWO) TIMES DAILY. 360 tablet 0  . metoprolol  succinate (TOPROL XL) 25 MG 24 hr tablet Take one tablet by mouth daily.( Take with 50 mg to total 75 mg daily.) 90 tablet 3  . metoprolol succinate (TOPROL-XL) 50 MG 24 hr tablet Take one tablet by mouth daily (Take with 25 mg tablet to total 75 mg daily) 90 tablet 3  . Multiple Vitamins-Minerals (MULTIVITAMIN WITH MINERALS) tablet Take 1 tablet by mouth daily.    . valsartan (DIOVAN) 320 MG tablet TAKE 1 TABLET (320 MG TOTAL) BY MOUTH DAILY. 90 tablet 0  . furosemide (LASIX) 20 MG tablet Take 20 mg by mouth 2 (two) times daily.    Marland Kitchen albuterol (PROAIR HFA) 108 (90 Base) MCG/ACT inhaler Inhale 2 puffs into the lungs 4 (four) times daily. (Patient not taking: Reported on 02/05/2017) 18 g 3  . clobetasol cream (TEMOVATE) 3.88 % Apply 1 application topically 2 (two) times daily. Up to 2 weeks at the time (Patient not taking: Reported on 02/05/2017) 45 g 1  . doxycycline (VIBRA-TABS) 100 MG tablet Take 1 tablet (100 mg total) by mouth 2 (two) times daily. (Patient not taking: Reported on 02/05/2017) 20 tablet 0  . fluticasone (FLONASE) 50 MCG/ACT nasal spray PLACE 2 SPRAYS INTO BOTH NOSTRILS DAILY. (Patient not taking: Reported on 02/05/2017) 16 g 3  . hydroquinone 4 % cream APPLY TO AFFECTED AREA TWICE A DAY (Patient not taking: Reported on 02/05/2017) 28.35 g 2  . ibuprofen (ADVIL,MOTRIN) 600 MG tablet TAKE 1 TABLET BY MOUTH EVERY 8 HOURS AS NEEDED (Patient not taking: Reported on 02/05/2017) 60 tablet 0  . NOVOLOG FLEXPEN 100 UNIT/ML FlexPen INJECT 25 UNITS INTO THE SKIN 3 (THREE) TIMES DAILY WITH MEALS. (Patient not taking: Reported on 02/05/2017) 90 mL 1   No facility-administered medications prior to visit.      Allergies:   Ramipril   Social History   Social History  . Marital status: Legally Separated    Spouse name: N/A  . Number of children: N/A  . Years of education: N/A   Social History Main Topics  . Smoking status: Never Smoker  . Smokeless tobacco: Never Used     Comment:  Married  . Alcohol use Yes     Comment: rare  . Drug use: No  . Sexual activity: Not Currently    Partners: Male    Birth control/ protection: None   Other Topics Concern  . None   Social History Narrative  . None     Family History:  The patient's family history includes Cancer in her father; Diabetes in her mother and other; Hypertension in her father and mother; Other in her mother.   ROS:   Please see the history of present illness.    Acne and hair loss.  All other systems reviewed and are negative.   PHYSICAL EXAM:   VS:  BP 136/84 (BP Location: Left Arm)   Pulse 79   Ht 5' 4.5" (1.638 m)   Wt 299 lb (135.6 kg)   BMI 50.53 kg/m    GEN: Well nourished, well developed, in no acute distress . Significant obesity/morbid HEENT: normal  Neck: no JVD, carotid bruits, or masses Cardiac: RRR; no murmurs, rubs, or gallops,no edema  Respiratory:  clear to auscultation bilaterally, normal work of breathing GI: soft, nontender, nondistended, + BS MS: no deformity or atrophy  Skin: warm and dry, no rash Neuro:  Alert and Oriented x 3, Strength and sensation are intact Psych: euthymic mood, full affect  Wt Readings from Last 3 Encounters:  02/05/17 299 lb (135.6 kg)  01/26/17 296 lb (134.3 kg)  12/08/16 295 lb (133.8 kg)      Studies/Labs Reviewed:   EKG:  EKG  none  Recent Labs: 02/06/2016: Pro B Natriuretic peptide (BNP) 23.0 09/09/2016: TSH 1.51 09/28/2016: ALT 10 12/02/2016: BUN 10; Creatinine, Ser 0.53; Potassium 3.3; Sodium 135 01/26/2017: Hemoglobin 12.1; Platelets 495   Lipid Panel    Component Value Date/Time   CHOL 196 11/16/2013 1416   TRIG 172.0 (H) 11/16/2013 1416   HDL 35.80 (L) 11/16/2013 1416   CHOLHDL 5 11/16/2013 1416   VLDL 34.4 11/16/2013 1416   LDLCALC 126 (H) 11/16/2013 1416    Additional studies/ records that were reviewed today include:   Echocardiogram 06/22/16: Study Conclusions   - Left ventricle: The cavity size was normal.  Systolic function was   normal. The estimated ejection fraction was in the range of 55%   to 60%. Wall motion was normal; there were no regional wall   motion abnormalities. Doppler parameters are consistent with   abnormal left ventricular relaxation (grade 1 diastolic   dysfunction). Acoustic contrast opacification revealed no   evidence ofthrombus.     ASSESSMENT:    1. Essential hypertension   2. Dyspnea, unspecified type   3. Morbid obesity ([place])   4. Palpitations      PLAN:  In order of problems listed above:  1. Excellent control at this time. Current plan is to decrease furosemide by 20 mg daily and add spironolactone 50 mg per day. Blood work in one week. Medication adjustment will be made based upon impact of this change on blood pressure. ARB therapy may have to be reduced in intensity. Basic metabolic panel will be done 2. Resolved 3. Continues to be a significant issue. 4. Resolved on higher dose metoprolol.  See above changes in therapy. Basic metabolic panel in a week. Follow-up in one month.    Medication Adjustments/Labs and Tests Ordered: Current medicines are reviewed at length with the patient today.  Concerns regarding medicines are outlined above.  Medication changes, Labs and Tests ordered today are listed in the Patient Instructions below. Patient Instructions  Medication Instructions:  1) DECREASE Furosemide to 92m once daily 2) START Spironolactone 566monce daily.  You will take two 2510mablets.  Labwork: Your physician recommends that you return for lab work in: 1 week (BMET)   Testing/Procedures: None  Follow-Up: Your physician wants you to follow-up in: 6 months with Dr. SmiTamala Julianou will receive a reminder letter in the mail two months in advance. If you don't receive a letter, please call our office to schedule the follow-up appointment.   Any Other Special Instructions Will Be Listed Below (If Applicable).  Please contact  our office  if you feel worse on the decreased dose of Furosemide or with adding the Aldactone.  Contact us if you have swelling, lightheadedness or dizziness.    If you need a refill on your cardiac medications before your next appointment, please call your pharmacy.      Signed, Sinclair Grooms, MD  02/05/2017 10:33 AM    [facility] [place], [place], [place]  [address] Phone: [phone]; Fax: [phone]

## 2017-02-05 ENCOUNTER — Encounter: Payer: Self-pay | Admitting: Interventional Cardiology

## 2017-02-05 ENCOUNTER — Ambulatory Visit (INDEPENDENT_AMBULATORY_CARE_PROVIDER_SITE_OTHER): Payer: BLUE CROSS/BLUE SHIELD | Admitting: Interventional Cardiology

## 2017-02-05 VITALS — BP 136/84 | HR 79 | Ht 64.5 in | Wt 299.0 lb

## 2017-02-05 DIAGNOSIS — I10 Essential (primary) hypertension: Secondary | ICD-10-CM

## 2017-02-05 DIAGNOSIS — R0600 Dyspnea, unspecified: Secondary | ICD-10-CM | POA: Diagnosis not present

## 2017-02-05 DIAGNOSIS — R002 Palpitations: Secondary | ICD-10-CM | POA: Diagnosis not present

## 2017-02-05 MED ORDER — SPIRONOLACTONE 25 MG PO TABS: 50.0000 mg | ORAL_TABLET | Freq: Every day | ORAL | 3 refills | Status: DC

## 2017-02-05 MED ORDER — FUROSEMIDE 20 MG PO TABS: 20.0000 mg | ORAL_TABLET | Freq: Every day | ORAL | 3 refills | Status: DC

## 2017-02-05 NOTE — Patient Instructions (Signed)
Medication Instructions:  1) DECREASE Furosemide to 20mg  once daily 2) START Spironolactone 50mg  once daily.  You will take two 25mg  tablets.  Labwork: Your physician recommends that you return for lab work in: 1 week (BMET)   Testing/Procedures: None  Follow-Up: Your physician wants you to follow-up in: 6 months with Dr. . You will receive a reminder letter in the mail two months in advance. If you don't receive a letter, please call our office to schedule the follow-up appointment.   Any Other Special Instructions Will Be Listed Below (If Applicable).  Please contact our office if you feel worse on the decreased dose of Furosemide or with adding the Aldactone.  Contact if you have swelling, lightheadedness or dizziness.    If you need a refill on your cardiac medications before your next appointment, please call your pharmacy.

## 2017-02-10 ENCOUNTER — Other Ambulatory Visit: Payer: Self-pay | Admitting: Internal Medicine

## 2017-02-10 ENCOUNTER — Telehealth: Payer: Self-pay | Admitting: Interventional Cardiology

## 2017-02-10 NOTE — Telephone Encounter (Signed)
New message     All the medication you called in Friday 02/05/17  to [facility] - was lost due to power outage can you please resend them    [facility] [address]. Please call

## 2017-02-10 NOTE — Telephone Encounter (Signed)
Furosemide and spironolactone was sent in on 02/05/17. [facility] called [facility] and spoke with the pharmacist and was informed that the furosemide was picked up yesterday by the patient. They did not receive the rx for spironolactone, I gave them a verbal order for this.

## 2017-02-11 ENCOUNTER — Other Ambulatory Visit: Payer: BLUE CROSS/BLUE SHIELD

## 2017-02-16 ENCOUNTER — Other Ambulatory Visit: Payer: Self-pay | Admitting: Family Medicine

## 2017-02-16 DIAGNOSIS — Z760 Encounter for issue of repeat prescription: Secondary | ICD-10-CM

## 2017-02-16 DIAGNOSIS — J4520 Mild intermittent asthma, uncomplicated: Secondary | ICD-10-CM

## 2017-02-18 ENCOUNTER — Other Ambulatory Visit: Payer: Self-pay | Admitting: Family Medicine

## 2017-02-19 ENCOUNTER — Other Ambulatory Visit: Payer: Self-pay

## 2017-02-19 DIAGNOSIS — Z794 Long term (current) use of insulin: Principal | ICD-10-CM

## 2017-02-19 DIAGNOSIS — E1165 Type 2 diabetes mellitus with hyperglycemia: Secondary | ICD-10-CM

## 2017-02-19 DIAGNOSIS — IMO0002 Reserved for concepts with insufficient information to code with codable children: Secondary | ICD-10-CM

## 2017-02-19 MED ORDER — INSULIN ASPART 100 UNIT/ML FLEXPEN: PEN_INJECTOR | SUBCUTANEOUS | 0 refills | Status: DC

## 2017-02-19 NOTE — Telephone Encounter (Signed)
Patient reports going to endo, is it okay to refill medication.

## 2017-02-19 NOTE — Telephone Encounter (Signed)
She follows with endocrinologists, Dr Elvera Lennox. Please contact her pharmacy, so they can send request to her office.  Thanks, BJ

## 2017-02-19 NOTE — Telephone Encounter (Signed)
She was d/c from the office in 11/2016 for multiple missed appts >> we can refill a 3 mo supply until she finds a new endocrinologist. Raynelle Fanning, can you please send this?

## 2017-02-24 ENCOUNTER — Ambulatory Visit (INDEPENDENT_AMBULATORY_CARE_PROVIDER_SITE_OTHER): Payer: BLUE CROSS/BLUE SHIELD | Admitting: Orthopaedic Surgery

## 2017-03-01 ENCOUNTER — Encounter: Payer: Self-pay | Admitting: Family Medicine

## 2017-03-01 ENCOUNTER — Ambulatory Visit: Payer: BLUE CROSS/BLUE SHIELD | Admitting: Family Medicine

## 2017-03-01 VITALS — BP 128/82 | HR 86 | Temp 98.8°F | Ht 64.5 in | Wt 293.4 lb

## 2017-03-01 DIAGNOSIS — M545 Low back pain: Secondary | ICD-10-CM

## 2017-03-01 DIAGNOSIS — R399 Unspecified symptoms and signs involving the genitourinary system: Secondary | ICD-10-CM | POA: Diagnosis not present

## 2017-03-01 DIAGNOSIS — R110 Nausea: Secondary | ICD-10-CM | POA: Diagnosis not present

## 2017-03-01 DIAGNOSIS — R202 Paresthesia of skin: Secondary | ICD-10-CM

## 2017-03-01 LAB — POCT URINALYSIS DIPSTICK
Leukocytes, UA: NEGATIVE
Nitrite, UA: POSITIVE
RBC UA: NEGATIVE
SPEC GRAV UA: 1.02 (ref 1.010–1.025)
UROBILINOGEN UA: 1 U/dL
pH, UA: 6 (ref 5.0–8.0)

## 2017-03-01 MED ORDER — METHYLPREDNISOLONE ACETATE 40 MG/ML IJ SUSP
40.0000 mg | Freq: Once | INTRAMUSCULAR | Status: AC
  Administered 2017-03-01: 40 mg via INTRAMUSCULAR

## 2017-03-01 MED ORDER — METHOCARBAMOL 750 MG PO TABS: 750.0000 mg | ORAL_TABLET | Freq: Three times a day (TID) | ORAL | 0 refills | Status: AC | PRN

## 2017-03-01 NOTE — Patient Instructions (Addendum)
A few things to remember from today's visit:   UTI symptoms - Plan: POCT urinalysis dipstick  Right low back pain, unspecified chronicity, with sciatica presence unspecified - Plan: methocarbamol (ROBAXIN) 750 MG tablet  Tingling sensation - Plan: Basic metabolic panel, CBC with Differential/Platelet  ? Radicular pain. Monitor for rash.  We will follow urine culture. Local ice.  Monitor blood sugars.   Please be sure medication list is accurate. If a new problem present, please set up appointment sooner than planned today.

## 2017-03-01 NOTE — Progress Notes (Signed)
Acute visit HPI:  Chief Complaint  Patient presents with  . Cystitis    [name] is a 51 y.o. female, who is here today complaining of 4 days of right side lower back pain, she thinks this could be related to a "bladder infection."  She states that she has not had back pain for years, 10-15 years ago she was diagnosed with a "ruptured disc."  Pain is intermittently, severe, sharp pain, 8-9/10,radiated to lateral aspect of the right hip and thigh. She also reports some tingling sensation on anterior aspect of the time. She denies saddle anesthesia or bowel incontinence. She has not had any injury or unusual activity. Pain is exacerbated by prolonged walking, standing up, and laying down.  Alleviated by rest. She could not sleep last night due to pain, "spasms" on her back when she is urinating. Reports SOB, states that she cannot breath when she has the pain. Denies chest pain or wheezing.  She has not noted fever but has had chills.  Hx of knee OA, recently received intra articular left knee injection. Dysuria: "Sometimes" Urinary frequency: Yes, since she started Spironolactone. She was supposed to have BMP last week but did not show. Urinary urgency: Stable. Incontinence: Denies Gross hematuria: Denies  Abdominal pain: Denies   Nausea or vomiting: Nausea, "all the time."She is not sure about exacerbating or alleviating factors.  Abnormal vaginal bleeding or discharge: Denies  S/P D&C and Mirena placed, follows with gyn.  Hx of UTI: a few visits in the past complaining about urinary symptoms. 08/2016 Ucx no growth. 07/2016 streptococcus agalactiae. 03/2016 Streptococcus agalactiae.  OTC medications for this problem: ibuprofen 800 mg and Hydrocodone-Acetaminophen half tablet at bedtime, did not help.   DM II: BS's 120's-130's. She denies polyuria, polydipsia, polyphagia.  Lab Results  Component Value Date   HGBA1C 6.7 (H) 12/02/2016      Review of Systems  Constitutional: Positive for activity change, chills and fatigue. Negative for appetite change and fever.  HENT: Negative for mouth sores, sore throat and trouble swallowing.   Respiratory: Negative for cough, shortness of breath and wheezing.   Cardiovascular: Negative for chest pain.  Gastrointestinal: Positive for nausea. Negative for abdominal pain, constipation, diarrhea and vomiting.  Endocrine: Negative for polydipsia, polyphagia and polyuria.  Genitourinary: Positive for dysuria, frequency and urgency. Negative for decreased urine volume, hematuria, vaginal bleeding and vaginal discharge.  Musculoskeletal: Positive for arthralgias and back pain. Negative for myalgias.  Skin: Negative for rash and wound.  Allergic/Immunologic: Positive for environmental allergies.  Neurological: Negative for weakness and headaches.  Hematological: Negative for adenopathy. Does not bruise/bleed easily.  Psychiatric/Behavioral: Positive for sleep disturbance. Negative for confusion. The patient is nervous/anxious.       Current Outpatient Medications on File Prior to Visit  Medication Sig Dispense Refill  . albuterol (PROVENTIL HFA;VENTOLIN HFA) 108 (90 Base) MCG/ACT inhaler Inhale 2 puffs into the lungs every 6 (six) hours as needed for wheezing or shortness of breath. 8.5 g 2  . atorvastatin (LIPITOR) 10 MG tablet TAKE 1 TABLET BY MOUTH EVERY DAY 90 tablet 0  . BAYER MICROLET LANCETS lancets Use as instructed to check sugar up to 6 times daily 600 each 5  . betamethasone valerate ointment (VALISONE) 0.1 % Apply 1 application topically daily as needed. ECZEMA  0  . Blood Glucose Monitoring Suppl (BAYER CONTOUR MONITOR) w/Device KIT Use to check sugar daily 1 each 0  . canagliflozin (INVOKANA) 300 MG TABS tablet Take  1 tablet (300 mg total) by mouth daily. 90 tablet 3  . citalopram (CELEXA) 40 MG tablet TAKE 1 TABLET BY MOUTH EVERY DAY 90 tablet 0  . clobetasol cream  (TEMOVATE) 7.09 % Apply 1 application topically 2 (two) times daily as needed (ECZEMA).    . Famotidine (PEPCID AC PO) Take 1 tablet by mouth every 6 (six) hours as needed (heartburn).    . fluticasone (FLONASE) 50 MCG/ACT nasal spray Place 2 sprays into both nostrils daily as needed for allergies.    . fluticasone (FLOVENT HFA) 44 MCG/ACT inhaler Inhale 1 puff into the lungs 2 (two) times daily as needed (shortness of breath).    . furosemide (LASIX) 20 MG tablet Take 1 tablet (20 mg total) by mouth daily. 90 tablet 3  . glucose blood (BAYER CONTOUR TEST) test strip Use as instructed to check sugar 6 times daily 600 each 5  . hydroquinone 4 % cream Apply 1 application topically 2 (two) times daily as needed (skin discoloration).    . hydrOXYzine (ATARAX/VISTARIL) 25 MG tablet Take 12.5 mg by mouth daily as needed for anxiety.  10  . ibuprofen (ADVIL,MOTRIN) 600 MG tablet Take 600 mg by mouth every 6 (six) hours as needed for moderate pain.    Marland Kitchen insulin aspart (NOVOLOG FLEXPEN) 100 UNIT/ML FlexPen INJECT 25 UNITS INTO THE SKIN 3 (THREE) TIMES DAILY 15 min before MEALS. 90 mL 0  . Insulin Glargine (TOUJEO SOLOSTAR) 300 UNIT/ML SOPN Inject 75 Units into the skin daily. 21 pen 1  . Insulin Pen Needle 31G X 5 MM MISC Use 4x a day 400 each 3  . metFORMIN (GLUCOPHAGE-XR) 500 MG 24 hr tablet TAKE 2 TABLETS (1,000 MG TOTAL) BY MOUTH 2 (TWO) TIMES DAILY. 360 tablet 0  . metoprolol succinate (TOPROL XL) 25 MG 24 hr tablet Take one tablet by mouth daily.( Take with 50 mg to total 75 mg daily.) 90 tablet 3  . metoprolol succinate (TOPROL-XL) 50 MG 24 hr tablet Take one tablet by mouth daily (Take with 25 mg tablet to total 75 mg daily) 90 tablet 3  . Multiple Vitamins-Minerals (MULTIVITAMIN WITH MINERALS) tablet Take 1 tablet by mouth daily.    Marland Kitchen spironolactone (ALDACTONE) 25 MG tablet Take 2 tablets (50 mg total) by mouth daily. 180 tablet 3  . valsartan (DIOVAN) 320 MG tablet TAKE 1 TABLET (320 MG TOTAL) BY  MOUTH DAILY. 90 tablet 0   No current facility-administered medications on file prior to visit.      Past Medical History:  Diagnosis Date  . Allergic rhinitis   . Anemia   . Anxiety   . Arthritis   . Asthma, mild intermittent 12/19/2015  . Complication of anesthesia    difficulty waking up from anesthesia  . Depression   . Diabetes mellitus type 2 in obese ([place])   . Hyperlipidemia   . Hypertension   . Low blood pressure reading    noticed lately  . Morbid obesity ([place])   . Pneumonia    history of  . Sleep apnea    mild sleep, no CPAP needed at this time   Allergies  Allergen Reactions  . Ramipril Shortness Of Breath    Social History   Socioeconomic History  . Marital status: Legally Separated    Spouse name: None  . Number of children: None  . Years of education: None  . Highest education level: None  Social Needs  . Financial resource strain: None  . Food insecurity -  worry: None  . Food insecurity - inability: None  . Transportation needs - medical: None  . Transportation needs - non-medical: None  Occupational History  . None  Tobacco Use  . Smoking status: Never Smoker  . Smokeless tobacco: Never Used  . Tobacco comment: Married  Substance and Sexual Activity  . Alcohol use: Yes    Comment: rare  . Drug use: No  . Sexual activity: Not Currently    Partners: Male    Birth control/protection: None  Other Topics Concern  . None  Social History Narrative  . None    Vitals:   03/01/17 1613  BP: 128/82  Pulse: 86  Temp: 98.8 F (37.1 C)  SpO2: 98%   Body mass index is 49.58 kg/m.   Physical Exam  Constitutional: She is oriented to person, place, and time. She appears well-developed. She appears distressed (when she moves due to pain).  HENT:  Head: Normocephalic and atraumatic.  Eyes: Conjunctivae are normal.  Cardiovascular: Normal rate and regular rhythm.  Murmur heard. Respiratory: Effort normal and breath sounds normal. No  respiratory distress.  GI: Soft. She exhibits no mass. There is no tenderness. There is no CVA tenderness.  Musculoskeletal: She exhibits no edema.       Lumbar back: She exhibits decreased range of motion. She exhibits no tenderness, no bony tenderness and no swelling.  No pain upon palpation of right thigh, no edema or skin changes.   Lymphadenopathy:    She has no cervical adenopathy.  Neurological: She is alert and oriented to person, place, and time. She has normal strength.  Reflex Scores:      Patellar reflexes are 2+ on the right side and 2+ on the left side. She is able to walk on tip toes and heels. Antalgic gait, not assisted.  Skin: Skin is warm. No rash noted. No erythema.  Psychiatric: Her mood appears anxious.  Fairly groomed,good eye contact.    ASSESSMENT AND PLAN:   Ms. Danashia was seen today for cystitis.  Diagnoses and all orders for this visit:  Lab Results  Component Value Date   WBC 8.9 03/01/2017   HGB 12.6 03/01/2017   HCT 40.2 03/01/2017   MCV 88.0 03/01/2017   PLT 435.0 (H) 03/01/2017   Lab Results  Component Value Date   CREATININE 0.74 03/01/2017   BUN 15 03/01/2017   NA 138 03/01/2017   K 4.3 03/01/2017   CL 105 03/01/2017   CO2 24 03/01/2017    UTI symptoms  Hx is not suggestive of UTI. She has hx of urinary frequency and urgency, ? Overactive bladder. Because urine dipstick abnormal, Ucx was ordered. Adequate hydration. Instructed about warning signs.  -     POCT urinalysis dipstick -     Urine Culture  Right low back pain, unspecified chronicity, with sciatica presence unspecified  Because no history of trauma, I don't think imaging is needed today. She has not tolerated Prednisone well in the past, he has worsened depression and insomnia. She has done well in the past with injectable steroids, so she agrees with Depo-Medrol 40 mg IM here in the office. We discussed some side effects of Methocarbamol. Local ice. Relative  rest. She still has Hydrocodone-Acetaminophen at home, recommended taking whole tablet at bedtime. She has an appt with ortho next week to follow on knee pain. If not better she is going to need ortho evaluation and/or lumbar MRI.  Instructed about warning signs.  -  methocarbamol (ROBAXIN) 750 MG tablet; Take 1 tablet (750 mg total) every 8 (eight) hours as needed for up to 15 days by mouth for muscle spasms. -     methylPREDNISolone acetate (DEPO-MEDROL) injection 40 mg  Tingling sensation  ? Radicular pain. ? Diabetic neuropathy. Monitor for new symptoms, including rash. Instructed about warning signs. Depo Medrol 40 mg IM today may help.  -     Basic metabolic panel -     CBC with Differential/Platelet  Nausea without vomiting  She is not sure if this is related to back pain. Other possible causes discussed: GI related,electrolyted abnormality, med side effects (Spironolactome). Recommend monitoring for now. Instructed about warning signs.   -Ms.Totiana Everson Boykin-Ferguson was advised to return or notify a doctor immediately if symptoms worsen or persist or new concerns arise.       Betty G. Martinique, MD  [facility]. [place] office.

## 2017-03-02 ENCOUNTER — Encounter: Payer: Self-pay | Admitting: Family Medicine

## 2017-03-02 LAB — CBC WITH DIFFERENTIAL/PLATELET
BASOS ABS: 0.1 10*3/uL (ref 0.0–0.1)
Basophils Relative: 0.7 % (ref 0.0–3.0)
EOS PCT: 3 % (ref 0.0–5.0)
Eosinophils Absolute: 0.3 10*3/uL (ref 0.0–0.7)
HCT: 40.2 % (ref 36.0–46.0)
HEMOGLOBIN: 12.6 g/dL (ref 12.0–15.0)
LYMPHS PCT: 43.7 % (ref 12.0–46.0)
Lymphs Abs: 3.9 10*3/uL (ref 0.7–4.0)
MCHC: 31.3 g/dL (ref 30.0–36.0)
MCV: 88 fl (ref 78.0–100.0)
MONOS PCT: 6 % (ref 3.0–12.0)
Monocytes Absolute: 0.5 10*3/uL (ref 0.1–1.0)
Neutro Abs: 4.1 10*3/uL (ref 1.4–7.7)
Neutrophils Relative %: 46.6 % (ref 43.0–77.0)
Platelets: 435 10*3/uL — ABNORMAL HIGH (ref 150.0–400.0)
RBC: 4.56 Mil/uL (ref 3.87–5.11)
RDW: 17.1 % — ABNORMAL HIGH (ref 11.5–15.5)
WBC: 8.9 10*3/uL (ref 4.0–10.5)

## 2017-03-02 LAB — BASIC METABOLIC PANEL
BUN: 15 mg/dL (ref 6–23)
CO2: 24 mEq/L (ref 19–32)
Calcium: 10.1 mg/dL (ref 8.4–10.5)
Chloride: 105 mEq/L (ref 96–112)
Creatinine, Ser: 0.74 mg/dL (ref 0.40–1.20)
GFR: 107.13 mL/min (ref >60.00)
Glucose, Bld: 151 mg/dL — ABNORMAL HIGH (ref 70–99)
POTASSIUM: 4.3 meq/L (ref 3.5–5.1)
SODIUM: 138 meq/L (ref 135–145)

## 2017-03-03 ENCOUNTER — Other Ambulatory Visit: Payer: Self-pay | Admitting: Family Medicine

## 2017-03-03 ENCOUNTER — Encounter: Payer: Self-pay | Admitting: Family Medicine

## 2017-03-03 DIAGNOSIS — IMO0002 Reserved for concepts with insufficient information to code with codable children: Secondary | ICD-10-CM

## 2017-03-03 DIAGNOSIS — E1165 Type 2 diabetes mellitus with hyperglycemia: Secondary | ICD-10-CM

## 2017-03-03 DIAGNOSIS — Z794 Long term (current) use of insulin: Principal | ICD-10-CM

## 2017-03-03 LAB — URINE CULTURE
MICRO NUMBER: 81239856
SPECIMEN QUALITY: ADEQUATE

## 2017-03-05 NOTE — Telephone Encounter (Signed)
Pt seen for appt 03/01/2017

## 2017-03-10 ENCOUNTER — Encounter (INDEPENDENT_AMBULATORY_CARE_PROVIDER_SITE_OTHER): Payer: Self-pay | Admitting: Physician Assistant

## 2017-03-10 ENCOUNTER — Ambulatory Visit (INDEPENDENT_AMBULATORY_CARE_PROVIDER_SITE_OTHER): Payer: BLUE CROSS/BLUE SHIELD

## 2017-03-10 ENCOUNTER — Ambulatory Visit (INDEPENDENT_AMBULATORY_CARE_PROVIDER_SITE_OTHER): Payer: BLUE CROSS/BLUE SHIELD | Admitting: Physician Assistant

## 2017-03-10 DIAGNOSIS — G8929 Other chronic pain: Secondary | ICD-10-CM

## 2017-03-10 DIAGNOSIS — M545 Low back pain: Secondary | ICD-10-CM

## 2017-03-10 DIAGNOSIS — M1712 Unilateral primary osteoarthritis, left knee: Secondary | ICD-10-CM

## 2017-03-10 MED ORDER — METHYLPREDNISOLONE ACETATE 40 MG/ML IJ SUSP
40.0000 mg | INTRAMUSCULAR | Status: AC | PRN
  Administered 2017-03-10: 40 mg via INTRA_ARTICULAR

## 2017-03-10 MED ORDER — LIDOCAINE HCL 1 % IJ SOLN
3.0000 mL | INTRAMUSCULAR | Status: AC | PRN
  Administered 2017-03-10: 3 mL

## 2017-03-10 NOTE — Progress Notes (Signed)
Office Visit Note   Patient: Heather Myers           Date of Birth: November 28, 1965           MRN: [id number] Visit Date: 03/10/2017              Requested by: [place], Betty G, MD [address] [place], [place] [address] PCP: [place], Betty G, MD   Assessment & Plan: Visit Diagnoses:  1. Chronic bilateral low back pain, with sciatica presence unspecified   2. Unilateral primary osteoarthritis, left knee     Plan: We will send her to physical therapy for her back this will include home exercise programs, core strengthening, stretching and modalities.  Her back pain does not begin to resolve in the next 2 weeks she will call our office and we will obtain an MRI to evaluate her lumbar spine for HNP.  In regards to her knee she will continue her ibuprofen.  Follow-up as needed.  Follow-Up Instructions: Return if symptoms worsen or fail to improve.   Orders:  Orders Placed This Encounter  Procedures  . XR Lumbar Spine 2-3 Views   No orders of the defined types were placed in this encounter.     Procedures: Large Joint Inj: L knee on 03/10/2017 9:31 PM Indications: pain Details: 22 Myers 1.5 in needle, anterolateral approach  Arthrogram: No  Medications: 3 mL lidocaine 1 %; 40 mg methylPREDNISolone acetate 40 MG/ML Outcome: tolerated well, no immediate complications Procedure, treatment alternatives, risks and benefits explained, specific risks discussed. Consent was given by the patient. Immediately prior to procedure a time out was called to verify the correct patient, procedure, equipment, support staff and site/side marked as required. Patient was prepped and draped in the usual sterile fashion.       Clinical Data: No additional findings.   Subjective: Chief Complaint  Patient presents with  . Lower Back - Pain    HPI Heather Myers comes in today due to increasing left knee pain for the last 3 weeks.  No known injury.  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  She had a Synvisc injection in the knee approximately 2 months ago and did well until 3 weeks ago when everything weather changed and the knee pain returned before then she was having no pain in the knee.  She also began having low back pain about 2 weeks ago thought it was a UTI saw her primary care physician at that time was having some pain shooting down the right leg to the anterior aspect of the ankle which continues and was given a cortisone injection IM which gave her about 70% relief.  She still having some pain down the right leg to the ankle.  Describes pain as a sharp throbbing pain.  She has had no bowel bladder dysfunction.  She has no waking pain but does have difficulty falling asleep due to the back pain.  She has a remote history of "slipped disc" 10 years ago but until recently she had had no pain in her back.. Her Review of Systems    Objective: Vital Signs: There were no vitals taken for this visit.  Physical Exam  Constitutional: She is oriented to person, place, and time. She appears well-developed and well-nourished. No distress.  Cardiovascular: Intact distal pulses.  Neurological: She is alert and oriented to person, place, and time.  Skin: She is not diaphoretic.  Psychiatric: She has a  normal mood and affect.    Ortho Exam Left knee she has tenderness along medial joint line.  Positive crepitus passive range of motion in the knee.  No instability valgus varus stressing.  No effusion abnormal warmth knee.  Lumbar spine she has tenderness in the lower lumbar paraspinous region on the right.  5 out of 5 strength throughout lower extremities against resistance.  Positive straight leg raise on the right tight hamstrings on the right negative straight leg raise on the left.  Deep tendon reflexes are 2+ at knees and equal and symmetric also 1+ at the ankles and equal and symmetric bilaterally.  She is able to touch her toes.   She has limited extension of the lumbar spine without pain. Specialty Comments:  No specialty comments available.  Imaging: Lumbar spine 2 views: No acute fracture.  Disc space well maintained.  Grade 1 L4 on L5 spondylolisthesis.  No other bony abnormalities.  Normal lordotic curvature    PMFS History: Patient Active Problem List   Diagnosis Date Noted  . Unilateral primary osteoarthritis, left knee 11/05/2016  . Insulin dependent type 2 diabetes mellitus, uncontrolled ([facility]) 12/19/2015  . Asthma, mild intermittent 12/19/2015  . Bilateral lower extremity edema 12/19/2015  . Rash and nonspecific skin eruption 10/19/2013  . DJD (degenerative joint disease) of knee 06/29/2013  . Dysmenorrhea 03/30/2013  . Dyspnea 06/11/2010  . Morbid obesity ([facility]) 12/21/2008  . Palpitations 12/21/2008  . GENITAL HERPES 03/22/2007  . HYPERLIPIDEMIA 03/22/2007  . Iron deficiency anemia 03/22/2007  . Anxiety disorder 03/22/2007  . DEPRESSION 03/22/2007  . Essential hypertension 03/22/2007  . ALLERGIC RHINITIS 03/22/2007  . DISC DISEASE, CERVICAL 03/22/2007  . 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 Allergic rhinitis   . Anemia   . Anxiety   . Arthritis   . Asthma, mild intermittent 12/19/2015  . Complication of anesthesia    difficulty waking up from anesthesia  . Depression   . Diabetes mellitus type 2 in obese ([facility])   . Hyperlipidemia   . Hypertension   . Low blood pressure reading    noticed lately  . Morbid obesity ([facility])   . Pneumonia    history of  . Sleep apnea    mild sleep, no CPAP needed at this time    Family History  Problem Relation Age of Onset  . Diabetes Mother   . Hypertension Mother   . Other Mother        renal failure  . Diabetes Other   . Hypertension Father   . Cancer Father     Past Surgical History:  Procedure Laterality Date  . ABDOMINAL SURGERY     chronic abd wall abscess  . CESAREAN SECTION     x's 2  . CHOLECYSTECTOMY     . KNEE ARTHROSCOPY Right   . OOPHORECTOMY     R ovary removed at [facility]  . RSO     Social History   Occupational History  . Not on file  Tobacco Use  . Smoking status: Never Smoker  . Smokeless tobacco: Never Used  . Tobacco comment: Married  Substance and Sexual Activity  . Alcohol use: Yes    Comment: rare  . Drug use: No  . Sexual activity: Not Currently    Partners: Male    Birth control/protection: None

## 2017-03-26 ENCOUNTER — Encounter: Payer: BLUE CROSS/BLUE SHIELD | Admitting: Family Medicine

## 2017-04-17 ENCOUNTER — Other Ambulatory Visit: Payer: Self-pay | Admitting: Family Medicine

## 2017-04-17 DIAGNOSIS — E1165 Type 2 diabetes mellitus with hyperglycemia: Secondary | ICD-10-CM

## 2017-04-17 DIAGNOSIS — IMO0002 Reserved for concepts with insufficient information to code with codable children: Secondary | ICD-10-CM

## 2017-04-17 DIAGNOSIS — Z794 Long term (current) use of insulin: Principal | ICD-10-CM

## 2017-04-30 ENCOUNTER — Other Ambulatory Visit: Payer: Self-pay | Admitting: Interventional Cardiology

## 2017-05-03 ENCOUNTER — Telehealth: Payer: Self-pay | Admitting: Obstetrics and Gynecology

## 2017-05-03 NOTE — Telephone Encounter (Signed)
Patient having abdominal and vaginal pain

## 2017-05-03 NOTE — Telephone Encounter (Signed)
No answer, unable to leave message, mailbox full.  

## 2017-05-06 ENCOUNTER — Other Ambulatory Visit: Payer: Self-pay | Admitting: Interventional Cardiology

## 2017-05-06 ENCOUNTER — Other Ambulatory Visit: Payer: Self-pay | Admitting: Family Medicine

## 2017-05-06 DIAGNOSIS — E1165 Type 2 diabetes mellitus with hyperglycemia: Secondary | ICD-10-CM

## 2017-05-06 DIAGNOSIS — Z794 Long term (current) use of insulin: Principal | ICD-10-CM

## 2017-05-06 DIAGNOSIS — IMO0002 Reserved for concepts with insufficient information to code with codable children: Secondary | ICD-10-CM

## 2017-05-11 ENCOUNTER — Other Ambulatory Visit: Payer: Self-pay | Admitting: Family Medicine

## 2017-05-11 DIAGNOSIS — F419 Anxiety disorder, unspecified: Secondary | ICD-10-CM

## 2017-05-11 NOTE — Telephone Encounter (Signed)
No answer, mailbox full, unable to leave message.

## 2017-05-17 NOTE — Telephone Encounter (Signed)
Dr. Oscar La, attempted to contact patient x2, no return call, ok to close encounter.

## 2017-05-23 ENCOUNTER — Other Ambulatory Visit: Payer: Self-pay | Admitting: Family Medicine

## 2017-05-23 DIAGNOSIS — Z760 Encounter for issue of repeat prescription: Secondary | ICD-10-CM

## 2017-05-24 NOTE — Telephone Encounter (Signed)
Please advise. Patient's last cholesterol was in 2008.

## 2017-05-26 ENCOUNTER — Other Ambulatory Visit: Payer: Self-pay | Admitting: Internal Medicine

## 2017-05-26 ENCOUNTER — Other Ambulatory Visit: Payer: Self-pay | Admitting: Family Medicine

## 2017-05-26 DIAGNOSIS — Z794 Long term (current) use of insulin: Principal | ICD-10-CM

## 2017-05-26 DIAGNOSIS — IMO0002 Reserved for concepts with insufficient information to code with codable children: Secondary | ICD-10-CM

## 2017-05-26 DIAGNOSIS — E1165 Type 2 diabetes mellitus with hyperglycemia: Secondary | ICD-10-CM

## 2017-06-03 ENCOUNTER — Ambulatory Visit: Payer: BLUE CROSS/BLUE SHIELD | Admitting: Certified Nurse Midwife

## 2017-06-03 ENCOUNTER — Telehealth: Payer: Self-pay | Admitting: Obstetrics and Gynecology

## 2017-06-03 NOTE — Telephone Encounter (Signed)
Left message to call Kaitlyn at [phone]. 

## 2017-06-03 NOTE — Telephone Encounter (Signed)
Patient called stating she is having "severe abdominal pain." She also reported some recent abnormal heavy bleeding. She'd like to see Dr. Oscar La only for evaluation.  Last seen: 01/26/17

## 2017-06-04 ENCOUNTER — Inpatient Hospital Stay (HOSPITAL_COMMUNITY): Payer: BLUE CROSS/BLUE SHIELD

## 2017-06-04 ENCOUNTER — Inpatient Hospital Stay (HOSPITAL_COMMUNITY)
Admission: AD | Admit: 2017-06-04 | Discharge: 2017-06-04 | Disposition: A | Discharge status: Home / Self Care | Payer: BLUE CROSS/BLUE SHIELD | Source: Ambulatory Visit | Attending: Gynecology | Admitting: Gynecology

## 2017-06-04 ENCOUNTER — Encounter (HOSPITAL_COMMUNITY): Payer: Self-pay

## 2017-06-04 DIAGNOSIS — Z79899 Other long term (current) drug therapy: Secondary | ICD-10-CM | POA: Diagnosis not present

## 2017-06-04 DIAGNOSIS — Z9889 Other specified postprocedural states: Secondary | ICD-10-CM | POA: Insufficient documentation

## 2017-06-04 DIAGNOSIS — R1030 Lower abdominal pain, unspecified: Secondary | ICD-10-CM | POA: Insufficient documentation

## 2017-06-04 DIAGNOSIS — N83202 Unspecified ovarian cyst, left side: Secondary | ICD-10-CM | POA: Diagnosis not present

## 2017-06-04 DIAGNOSIS — I10 Essential (primary) hypertension: Secondary | ICD-10-CM | POA: Diagnosis not present

## 2017-06-04 DIAGNOSIS — N83292 Other ovarian cyst, left side: Secondary | ICD-10-CM | POA: Diagnosis not present

## 2017-06-04 DIAGNOSIS — Z9049 Acquired absence of other specified parts of digestive tract: Secondary | ICD-10-CM | POA: Diagnosis not present

## 2017-06-04 DIAGNOSIS — R102 Pelvic and perineal pain: Secondary | ICD-10-CM | POA: Diagnosis not present

## 2017-06-04 DIAGNOSIS — Z794 Long term (current) use of insulin: Secondary | ICD-10-CM | POA: Insufficient documentation

## 2017-06-04 DIAGNOSIS — E119 Type 2 diabetes mellitus without complications: Secondary | ICD-10-CM | POA: Insufficient documentation

## 2017-06-04 LAB — URINALYSIS, ROUTINE W REFLEX MICROSCOPIC
BILIRUBIN URINE: NEGATIVE
Bacteria, UA: NONE SEEN
Hgb urine dipstick: NEGATIVE
Ketones, ur: NEGATIVE mg/dL
Leukocytes, UA: NEGATIVE
NITRITE: NEGATIVE
PH: 5 (ref 5.0–8.0)
Protein, ur: NEGATIVE mg/dL
SPECIFIC GRAVITY, URINE: 1.009 (ref 1.005–1.030)

## 2017-06-04 LAB — CBC
HCT: 38.6 % (ref 36.0–46.0)
Hemoglobin: 12.6 g/dL (ref 12.0–15.0)
MCH: 29.6 pg (ref 26.0–34.0)
MCHC: 32.6 g/dL (ref 30.0–36.0)
MCV: 90.8 fL (ref 78.0–100.0)
PLATELETS: 364 10*3/uL (ref 150–400)
RBC: 4.25 MIL/uL (ref 3.87–5.11)
RDW: 14.7 % (ref 11.5–15.5)
WBC: 10.5 10*3/uL (ref 4.0–10.5)

## 2017-06-04 LAB — POCT PREGNANCY, URINE: Preg Test, Ur: NEGATIVE

## 2017-06-04 LAB — WET PREP, GENITAL
Clue Cells Wet Prep HPF POC: NONE SEEN
SPERM: NONE SEEN
Trich, Wet Prep: NONE SEEN
YEAST WET PREP: NONE SEEN

## 2017-06-04 MED ORDER — OXYCODONE HCL 5 MG PO TABS
10.0000 mg | ORAL_TABLET | Freq: Once | ORAL | Status: DC
  Filled 2017-06-04 (×2): qty 2

## 2017-06-04 MED ORDER — OXYCODONE HCL 5 MG PO TABS
5.0000 mg | ORAL_TABLET | Freq: Once | ORAL | Status: AC
  Administered 2017-06-04: 5 mg via ORAL

## 2017-06-04 MED ORDER — OXYCODONE HCL 5 MG PO CAPS: 5.0000 mg | ORAL_CAPSULE | Freq: Four times a day (QID) | ORAL | 0 refills | Status: DC | PRN

## 2017-06-04 NOTE — MAU Note (Signed)
Pt advised to come to MAU by MD office, C/O severe lower abd pain x 4 days, very tender to touch, pain with urination & walking.  Has SOB with urination, possibly because it is so painful.  Also constipated.  Has taken urostat OTC, helped slightly.  Has had IUD since July to control periods, had not had a period until 2 weeks ago, bleeding was very heavy, lasted 8 days, is now spotting.

## 2017-06-04 NOTE — Telephone Encounter (Signed)
Patient is at MAU currently. Spoke with Vikki Ports at MAU to switch provider to Dr.Jertson instead of Dr.Fontaine.  Routing to provider for final review. Patient agreeable to disposition. Will close encounter.

## 2017-06-04 NOTE — Discharge Instructions (Signed)
In late 2019, the [facility] will be moving to the [facility] campus. At that time, the MAU (Maternity Admissions Unit), where you are being seen today, will no longer take care of non-pregnant patients. We strongly encourage you to find a doctor's office before that time, so that you can be seen with any GYN concerns, like vaginal discharge, urinary tract infection, etc.. in a timely manner.  In order to make an office visit more convenient, the Center for [facility] at [facility] will be offering evening hours with same-day appointments, walk-in appointments and scheduled appointments available during this time.  Center for [facility] @ [facility] Hours: Monday - 8am - 7:30 pm with walk-in between 4pm- 7:30 pm Tuesday - 8 am - 5 pm (starting 07/27/17 we will be open late and accepting walk-ins from 4pm - 7:30pm) Wednesday - 8 am - 5 pm (starting 10/27/17 we will be open late and accepting walk-ins from 4pm - 7:30pm) Thursday 8 am - 5 pm (starting 01/27/18 we will be open late and accepting walk-ins from 4pm - 7:30pm) Friday 8 am - 5 pm  For an appointment please call the Center for [facility] @ [facility] at [phone]  For urgent needs, [facility] is also available for management of urgent GYN complaints such as vaginal discharge or urinary tract infections.   Ovarian Cyst An ovarian cyst is a fluid-filled sac that forms on an ovary. The ovaries are small organs that produce eggs in women. Various types of cysts can form on the ovaries. Some may cause symptoms and require treatment. Most ovarian cysts go away on their own, are not cancerous (are benign), and do not cause problems. Common types of ovarian cysts include:  Functional (follicle) cysts. ? Occur during the menstrual cycle, and usually go away with the next menstrual cycle if you do not get pregnant. ? Usually cause no symptoms.  Endometriomas. ? Are cysts that form from  the tissue that lines the uterus (endometrium). ? Are sometimes called chocolate cysts because they become filled with blood that turns brown. ? Can cause pain in the lower abdomen during intercourse and during your period.  Cystadenoma cysts. ? Develop from cells on the outside surface of the ovary. ? Can get very large and cause lower abdomen pain and pain with intercourse. ? Can cause severe pain if they twist or break open (rupture).  Dermoid cysts. ? Are sometimes found in both ovaries. ? May contain different kinds of body tissue, such as skin, teeth, hair, or cartilage. ? Usually do not cause symptoms unless they get very big.  Theca lutein cysts. ? Occur when too much of a certain hormone (human chorionic gonadotropin) is produced and overstimulates the ovaries to produce an egg. ? Are most common after having procedures used to assist with the conception of a baby (in vitro fertilization).  What are the causes? Ovarian cysts may be caused by:  Ovarian hyperstimulation syndrome. This is a condition that can develop from taking fertility medicines. It causes multiple large ovarian cysts to form.  Polycystic ovarian syndrome (PCOS). This is a common hormonal disorder that can cause ovarian cysts, as well as problems with your period or fertility.  What increases the risk? The following factors may make you more likely to develop ovarian cysts:  Being overweight or obese.  Taking fertility medicines.  Taking certain forms of hormonal birth control.  Smoking.  What are the signs or symptoms? Many ovarian  cysts do not cause symptoms. If symptoms are present, they may include:  Pelvic pain or pressure.  Pain in the lower abdomen.  Pain during sex.  Abdominal swelling.  Abnormal menstrual periods.  Increasing pain with menstrual periods.  How is this diagnosed? These cysts are commonly found during a routine pelvic exam. You may have tests to find out more about  the cyst, such as:  Ultrasound.  X-ray of the pelvis.  CT scan.  MRI.  Blood tests.  How is this treated? Many ovarian cysts go away on their own without treatment. Your health care provider may want to check your cyst regularly for 2-3 months to see if it changes. If you are in menopause, it is especially important to have your cyst monitored closely because menopausal women have a higher rate of ovarian cancer. When treatment is needed, it may include:  Medicines to help relieve pain.  A procedure to drain the cyst (aspiration).  Surgery to remove the whole cyst.  Hormone treatment or birth control pills. These methods are sometimes used to help dissolve a cyst.  Follow these instructions at home:  Take over-the-counter and prescription medicines only as told by your health care provider.  Do not drive or use heavy machinery while taking prescription pain medicine.  Get regular pelvic exams and Pap tests as often as told by your health care provider.  Return to your normal activities as told by your health care provider. Ask your health care provider what activities are safe for you.  Do not use any products that contain nicotine or tobacco, such as cigarettes and e-cigarettes. If you need help quitting, ask your health care provider.  Keep all follow-up visits as told by your health care provider. This is important. Contact a health care provider if:  Your periods are late, irregular, or painful, or they stop.  You have pelvic pain that does not go away.  You have pressure on your bladder or trouble emptying your bladder completely.  You have pain during sex.  You have any of the following in your abdomen: ? A feeling of fullness. ? Pressure. ? Discomfort. ? Pain that does not go away. ? Swelling.  You feel generally ill.  You become constipated.  You lose your appetite.  You develop severe acne.  You start to have more body hair and facial  hair.  You are gaining weight or losing weight without changing your exercise and eating habits.  You think you may be pregnant. Get help right away if:  You have abdominal pain that is severe or gets worse.  You cannot eat or drink without vomiting.  You suddenly develop a fever.  Your menstrual period is much heavier than usual. This information is not intended to replace advice given to you by your health care provider. Make sure you discuss any questions you have with your health care provider. Document Released: 04/13/2005 Document Revised: 11/01/2015 Document Reviewed: 09/15/2015 Elsevier Interactive Patient Education  [employer].

## 2017-06-04 NOTE — Telephone Encounter (Signed)
Spoke with patient. Patient states she had an IUD placed in 10/2016 for heavy bleeding. Has had no bleeding since IUD placement. 2 weeks ago she began to have a menses that lasted 8 days. Now is having spotting. Having "severe" abdominal pain and pressure. Also having urinary urgency, frequency, and little output. Advised will review with Dr.Jertson and return call as she will need further evaluation.

## 2017-06-04 NOTE — Telephone Encounter (Signed)
The patient didn't show up at MAU, please call and check on her.

## 2017-06-04 NOTE — MAU Provider Note (Signed)
Chief Complaint: Abdominal Pain and Dysuria   SUBJECTIVE HPI: Heather Myers is a 52 y.o. [id number] who presents to MAU with lower abdominal pain. She was sent over to the MAU from her provider office. Patient states she has been having "severe: abdominal pain that is located right above her pubic symphysis for 4-5 days. She states it hurts to touch and sometimes move. She has not had this type of pain before. Also endorsing some pain/pressure when urinating. She denies fevers, dysuria, bleeding, n/v. Patient believes she may be constipated but last BM was today and was loose.   Patient states he has a history of heavy menstraul bleeding. Had an IUD placed in July 6010 without complication. Two weeks ago she bleed for the first time since placement and it was heavy. Has no bleeding now. PMH significant for right oophorectomy and prior c-section.   She called her GYN provider but was unable to get into their office and was instructed to present to MAU for severe pain.    Past Medical History:  Diagnosis Date  . Allergic rhinitis   . Anemia   . Anxiety   . Arthritis   . Asthma, mild intermittent 12/19/2015  . Complication of anesthesia    difficulty waking up from anesthesia  . Depression   . Diabetes mellitus type 2 in obese ([place])   . Hyperlipidemia   . Hypertension   . Low blood pressure reading    noticed lately  . Morbid obesity ([place])   . Pneumonia    history of  . Sleep apnea    mild sleep, no CPAP needed at this time   OB History  Gravida Para Term Preterm AB Living  _0 SAB TAB Ectopic Multiple Live Births  1       2    # Outcome Date GA Lbr Len/2nd Weight Sex Delivery Anes PTL Lv  3 SAB           2 Para      CS-Unspec   LIV  1 Para      CS-Unspec   LIV     Past Surgical History:  Procedure Laterality Date  . ABDOMINAL SURGERY     chronic abd wall abscess  . CESAREAN SECTION     x's 2  . CHOLECYSTECTOMY    . DILATATION & CURETTAGE/HYSTEROSCOPY  WITH MYOSURE N/A 12/08/2016   Procedure: DILATATION & CURETTAGE/HYSTEROSCOPY WITH MYOSURE;  Surgeon: Salvadore Dom, MD;  Location: [facility];  Service: Gynecology;  Laterality: N/A;  . INTRAUTERINE DEVICE (IUD) INSERTION N/A 12/08/2016   Procedure: MIRENA INTRAUTERINE DEVICE (IUD) INSERTION (IUD FROM OFFICE);  Surgeon: Salvadore Dom, MD;  Location: [facility];  Service: Gynecology;  Laterality: N/A;  . KNEE ARTHROSCOPY Right   . LIPOMA EXCISION  08/03/2011   Procedure: EXCISION LIPOMA;  Surgeon: Harl Bowie, MD;  Location: [facility];  Service: General;  Laterality: Right;  wide excision chronic abscess Right lower abdominal wall  . OOPHORECTOMY     R ovary removed at [place] History   Socioeconomic History  . Marital status: Legally Separated    Spouse name: Not on file  . Number of children: Not on file  . Years of education: Not on file  . Highest education level: Not on file  Social Needs  . Financial resource strain: Not on file  . Food insecurity - worry: Not  on file  . Food insecurity - inability: Not on file  . Transportation needs - medical: Not on file  . Transportation needs - non-medical: Not on file  Occupational History  . Not on file  Tobacco Use  . Smoking status: Never Smoker  . Smokeless tobacco: Never Used  . Tobacco comment: Married  Substance and Sexual Activity  . Alcohol use: Yes    Comment: rare  . Drug use: No  . Sexual activity: Not Currently    Partners: Male    Birth control/protection: IUD  Other Topics Concern  . Not on file  Social History Narrative  . Not on file   No current facility-administered medications on file prior to encounter.    Current Outpatient Medications on File Prior to Encounter  Medication Sig Dispense Refill  . atorvastatin (LIPITOR) 10 MG tablet TAKE 1 TABLET BY MOUTH EVERY DAY 90 tablet 0  . betamethasone valerate ointment (VALISONE) 0.1 % Apply 1 application topically  daily as needed. ECZEMA  0  . citalopram (CELEXA) 40 MG tablet TAKE 1 TABLET BY MOUTH EVERY DAY 30 tablet 0  . ibuprofen (ADVIL,MOTRIN) 600 MG tablet Take 600 mg by mouth every 6 (six) hours as needed for moderate pain.    Marland Kitchen insulin aspart (NOVOLOG FLEXPEN) 100 UNIT/ML FlexPen INJECT 25 UNITS INTO THE SKIN 3 (THREE) TIMES DAILY 15 min before MEALS. 90 mL 0  . Insulin Glargine (TOUJEO SOLOSTAR) 300 UNIT/ML SOPN Inject 75 Units into the skin daily. 21 pen 1  . Insulin Pen Needle 31G X 5 MM MISC Use 4x a day 400 each 3  . INVOKANA 300 MG TABS tablet TAKE 1 TABLET BY MOUTH EVERY DAY 90 tablet 3  . metFORMIN (GLUCOPHAGE-XR) 500 MG 24 hr tablet TAKE 2 TABLETS BY MOUTH TWICE A DAY 360 tablet 0  . metoprolol succinate (TOPROL-XL) 25 MG 24 hr tablet TAKE ONE TABLET BY MOUTH DAILY.( TAKE WITH 50 MG TO TOTAL 75 MG DAILY.) 90 tablet 2  . metoprolol succinate (TOPROL-XL) 50 MG 24 hr tablet TAKE ONE TABLET BY MOUTH DAILY (TAKE WITH 25 MG TABLET TO TOTAL 75 MG DAILY) 90 tablet 2  . Multiple Vitamins-Minerals (MULTIVITAMIN WITH MINERALS) tablet Take 1 tablet by mouth daily.    Marland Kitchen albuterol (PROVENTIL HFA;VENTOLIN HFA) 108 (90 Base) MCG/ACT inhaler Inhale 2 puffs into the lungs every 6 (six) hours as needed for wheezing or shortness of breath. 8.5 g 2  . BAYER MICROLET LANCETS lancets Use as instructed to check sugar up to 6 times daily 600 each 5  . Blood Glucose Monitoring Suppl (BAYER CONTOUR MONITOR) w/Device KIT Use to check sugar daily 1 each 0  . furosemide (LASIX) 20 MG tablet Take 1 tablet (20 mg total) by mouth daily. 90 tablet 3  . glucose blood (BAYER CONTOUR TEST) test strip Use as instructed to check sugar 6 times daily 600 each 5  . hydroquinone 4 % cream Apply 1 application topically 2 (two) times daily as needed (skin discoloration).    Marland Kitchen spironolactone (ALDACTONE) 25 MG tablet Take 2 tablets (50 mg total) by mouth daily. 180 tablet 3  . valsartan (DIOVAN) 320 MG tablet TAKE 1 TABLET (320 MG TOTAL)  BY MOUTH DAILY. 90 tablet 0   Allergies  Allergen Reactions  . Ramipril Shortness Of Breath    I have reviewed the past Medical Hx, Surgical Hx, Social Hx, Allergies and Medications.   REVIEW OF SYSTEMS All systems reviewed and are negative for acute change  except as noted in the HPI.   OBJECTIVE BP (!) 110/45 (BP Location: Right Arm)   Pulse 88   Temp (!) 97.5 F (36.4 C) (Oral)   Resp 19   Ht _0  (1.651 m)   Wt 134.7 kg (297 lb)   LMP 05/22/2017   BMI 49.42 kg/m    PHYSICAL EXAM Constitutional: Well-developed, well-nourished obese female in no acute distress.  HEENT: NCAT, EOMI, neck supple, o/p clear Cardiovascular: normal rate and rhythm, pulses intact Respiratory: normal rate and effort.  GI: Abd soft, tender to palpation suprapubic and in LLQ, non-distended. Panniculus. Pos BS x 4 MS: Extremities nontender, no edema, normal ROM Neurologic: Alert and oriented x 4. No focal deficits GU: Neg CVAT. SPECULUM EXAM: NEFG, physiologic discharge, no blood noted, cervix clean, IUD appreciated BIMANUAL: uterus normal size, left adnexal tenderness. No CMT. Psych: normal mood and affect  LAB RESULTS Results for orders placed or performed during the hospital encounter of 06/04/17 (from the past 24 hour(s))  Urinalysis, Routine w reflex microscopic     Status: Abnormal   Collection Time: 06/04/17  1:51 PM  Result Value Ref Range   Color, Urine STRAW (A) YELLOW   APPearance CLEAR CLEAR   Specific Gravity, Urine 1.009 1.005 - 1.030   pH 5.0 5.0 - 8.0   Glucose, UA >=500 (A) NEGATIVE mg/dL   Hgb urine dipstick NEGATIVE NEGATIVE   Bilirubin Urine NEGATIVE NEGATIVE   Ketones, ur NEGATIVE NEGATIVE mg/dL   Protein, ur NEGATIVE NEGATIVE mg/dL   Nitrite NEGATIVE NEGATIVE   Leukocytes, UA NEGATIVE NEGATIVE   RBC / HPF 0-5 0 - 5 RBC/hpf   WBC, UA 0-5 0 - 5 WBC/hpf   Bacteria, UA NONE SEEN NONE SEEN   Squamous Epithelial / LPF 0-5 (A) NONE SEEN  Pregnancy, urine POC      Status: None   Collection Time: 06/04/17  2:07 PM  Result Value Ref Range   Preg Test, Ur NEGATIVE NEGATIVE  Wet prep, genital     Status: Abnormal   Collection Time: 06/04/17  4:45 PM  Result Value Ref Range   Yeast Wet Prep HPF POC NONE SEEN NONE SEEN   Trich, Wet Prep NONE SEEN NONE SEEN   Clue Cells Wet Prep HPF POC NONE SEEN NONE SEEN   WBC, Wet Prep HPF POC MODERATE (A) NONE SEEN   Sperm NONE SEEN   CBC     Status: None   Collection Time: 06/04/17  6:44 PM  Result Value Ref Range   WBC 10.5 4.0 - 10.5 K/uL   RBC 4.25 3.87 - 5.11 MIL/uL   Hemoglobin 12.6 12.0 - 15.0 g/dL   HCT 38.6 36.0 - 46.0 %   MCV 90.8 78.0 - 100.0 fL   MCH 29.6 26.0 - 34.0 pg   MCHC 32.6 30.0 - 36.0 g/dL   RDW 14.7 11.5 - 15.5 %   Platelets 364 150 - 400 K/uL    IMAGING US Pelvic Complete W Transvaginal And Torsion R/o  Result Date: 06/04/2017 CLINICAL DATA:  Left lower quadrant abdominal pain for the past 4 days. Previous C-section and right oophorectomy. EXAM: TRANSABDOMINAL AND TRANSVAGINAL ULTRASOUND OF PELVIS TECHNIQUE: Both transabdominal and transvaginal ultrasound examinations of the pelvis were performed. Transabdominal technique was performed for global imaging of the pelvis including uterus, ovaries, adnexal regions, and pelvic cul-de-sac. It was necessary to proceed with endovaginal exam following the transabdominal exam to visualize the uterus and left ovary in better detail. COMPARISON:  None  FINDINGS: Uterus Measurements: 10.9 x 5.9 x 5.8 cm. Mildly heterogeneous anteriorly without a discrete mass visualized. Endometrium Thickness: Shadowing due to the presence of an intrauterine device. The visualized portion measures 4.0 mm in thickness. No focal abnormality visualized. Right ovary Surgically absent. There is a 4.6 x 3.6 x 3.3 cm fluid collection in the right adnexa with an eccentric rind of soft tissue. Left ovary Measurements: 4.5 x 2.2 x 2.1 cm. 2.9 cm simple appearing cyst. The left ovary is  suboptimally visualized due to the body habitus of the patient with grossly normal internal blood flow with color Doppler and duplex Doppler. Other findings No abnormal free fluid. IMPRESSION: 1. 2.9 cm simple appearing left ovarian cyst. 2. 4.6 cm right adnexal fluid collection with an eccentric rind of soft tissue. This may represent an ovarian remnant cyst or postoperative seroma. The eccentric rind of soft tissue raises the possibility of a cystic ovarian remnant neoplasm. Therefore, a follow-up pelvic ultrasound is recommended in 6-12 weeks. Alternatively, this could be further evaluated with pre and postcontrast magnetic resonance imaging of the pelvis at this time. 3. Intrauterine device in expected location in the endometrium. 4. Mildly heterogeneous anterior uterine myometrium with no discrete mass visualized. Electronically Signed   By: Claudie Revering M.D.   On: 06/04/2017 18:16    MAU COURSE Vitals and nursing notes reviewed I have ordered labs/imaging and reviewed them UA normal except for glucosuria which appears to always be constant in patients urine; Urine sent for culture Upreg negative Wet prep negative; cultures pending CBC without leukocytosis or anemia [place] without acute emergent findings; did note a small simple cyst on left ovary and right adnexal fluid collectin and soft tissue remnant; IUD normal Treatments given in MAU: Oxycodone 57m for pain  MDM Plan of care reviewed with patient, including labs and tests ordered and medical treatment.  Discussed findings and plan with Dr. JTalbert Nanwhom is agreeable.   ASSESSMENT 1. Lower abdominal pain   2. Pelvic pain   3. Cyst of left ovary     PLAN Discharge home in stable condition Rx for Oxycodone for pain Counseled on return precautions Follow-up in GYN office for [facility] Cultures pending and patient to be contacted if positive Handout given   JLuiz Blare DO [facility], [facility]2/11/2017, 8:07 PM

## 2017-06-04 NOTE — Telephone Encounter (Signed)
Spoke with patient. Advised Dr.Jertson recommends that she been seen at MAU for further evaluation. Patient is agreeable and will head there at this time. Spoke with charge nurse Fleet Contras to advise of patient coming for evaluation and to contact the office at [phone] with any care questions or information.  Routing to provider for final review. Patient agreeable to disposition. Will close encounter.

## 2017-06-06 LAB — URINE CULTURE

## 2017-06-07 ENCOUNTER — Telehealth: Payer: Self-pay | Admitting: Obstetrics and Gynecology

## 2017-06-07 LAB — GC/CHLAMYDIA PROBE AMP (~~LOC~~) NOT AT ARMC
Chlamydia: NEGATIVE
NEISSERIA GONORRHEA: NEGATIVE

## 2017-06-07 NOTE — Telephone Encounter (Signed)
Returning went to the ER 06/04/17 and would like to follow up with Dr.Jertson this week.

## 2017-06-07 NOTE — Telephone Encounter (Signed)
Please check on the patient's pain. I'm happy to see her in the next day or so if she isn't feeling better Let her know that I reviewed her chart and her ultrasound report. She should have a f/u ultrasound in 6 weeks (please schedule that)

## 2017-06-07 NOTE — Telephone Encounter (Signed)
Seen in ER on 06/04/2017 for lower abdominal pain. PUS was performed. Routing to Dr.Jertson for review and recommended follow up.  IMPRESSION: 1. 2.9 cm simple appearing left ovarian cyst. 2. 4.6 cm right adnexal fluid collection with an eccentric rind of soft tissue. This may represent an ovarian remnant cyst or postoperative seroma. The eccentric rind of soft tissue raises the possibility of a cystic ovarian remnant neoplasm. Therefore, a follow-up pelvic ultrasound is recommended in 6-12 weeks. Alternatively, this could be further evaluated with pre and postcontrast magnetic resonance imaging of the pelvis at this time. 3. Intrauterine device in expected location in the endometrium. 4. Mildly heterogeneous anterior uterine myometrium with no discrete mass visualized.

## 2017-06-07 NOTE — Telephone Encounter (Addendum)
Spoke with patient. Patient sates that she is still in pain. Was prescribed Oxycodone 5 mg that provides relief but patient does not want to take this, Advised may take OTC Ibuprofen 800 mg q 8 hours. Patient states "I do not want to have to take medication to mask what is going on." Advised will need to be seen for further evaluation. Declines OV today. Appointment scheduled for tomorrow 06/08/2017 at 10:45 am with Dr.Jertson. Patient is agreeable to date and time. Aware if symptoms worsen or develops new symptoms will need to be seen at MAU for further evaluation over night.  Routing to provider for final review. Patient agreeable to disposition. Will close encounter.

## 2017-06-08 ENCOUNTER — Other Ambulatory Visit: Payer: Self-pay

## 2017-06-08 ENCOUNTER — Ambulatory Visit (INDEPENDENT_AMBULATORY_CARE_PROVIDER_SITE_OTHER): Payer: BLUE CROSS/BLUE SHIELD | Admitting: Obstetrics and Gynecology

## 2017-06-08 ENCOUNTER — Encounter: Payer: Self-pay | Admitting: Obstetrics and Gynecology

## 2017-06-08 VITALS — BP 128/80 | HR 88 | Resp 18 | Wt 294.0 lb

## 2017-06-08 DIAGNOSIS — N762 Acute vulvitis: Secondary | ICD-10-CM

## 2017-06-08 DIAGNOSIS — R188 Other ascites: Secondary | ICD-10-CM

## 2017-06-08 DIAGNOSIS — R3989 Other symptoms and signs involving the genitourinary system: Secondary | ICD-10-CM

## 2017-06-08 DIAGNOSIS — N83202 Unspecified ovarian cyst, left side: Secondary | ICD-10-CM

## 2017-06-08 DIAGNOSIS — R102 Pelvic and perineal pain: Secondary | ICD-10-CM

## 2017-06-08 DIAGNOSIS — R300 Dysuria: Secondary | ICD-10-CM | POA: Diagnosis not present

## 2017-06-08 LAB — POCT URINALYSIS DIPSTICK
Bilirubin, UA: NEGATIVE
Blood, UA: NEGATIVE
Ketones, UA: NEGATIVE
Leukocytes, UA: NEGATIVE
Nitrite, UA: NEGATIVE
Protein, UA: NEGATIVE
Urobilinogen, UA: NEGATIVE U/dL — AB
pH, UA: 6 (ref 5.0–8.0)

## 2017-06-08 MED ORDER — DOXYCYCLINE HYCLATE 100 MG PO CAPS: 100.0000 mg | ORAL_CAPSULE | Freq: Two times a day (BID) | ORAL | 0 refills | Status: DC

## 2017-06-08 MED ORDER — PHENAZOPYRIDINE HCL 200 MG PO TABS: 200.0000 mg | ORAL_TABLET | Freq: Three times a day (TID) | ORAL | 0 refills | Status: DC | PRN

## 2017-06-08 NOTE — Progress Notes (Signed)
GYNECOLOGY  VISIT   HPI: 52 y.o.   Legally Separated  African American  female   [phone] with Patient's last menstrual period was 05/22/2017.   here for c/o left sided pelvic pain and vaginal irritation.   The patient has an 8 day h/o lower abdominal pain. She was seen in the ER on 06/04/17 and had a negative UPT, negative urinalysis, normal CBC. Ultrasound showed her IUD in place, she had a 4.6 cm fluid collection in the right adnexa, with a question of a right ovarian remnant. She also had a 2.9 cm simple cyst on the left ovary. She was sent home on pain meds and is here for f/u. She is hurting still, pain is helped with the narcotics.  Currently the pain is intermittent (prior was constant), in the LLQ right above the pubic region. The pain is sharp, lasts for a few minutes at a time, more frequent with urination, feels like a spasm. Some help with uristat. Pain is a 6/10 in severity (down from a nine). Some nausea, no emesis. She does suffer with constipation, has a BM 1 x a day (down from 2-3 x a day). Not hard, but less frequent for her.  No change in urinary frequency or urgency. Some dysuria.  She has slight vaginal itching,  When she wipes, she gets what looks like brown scaly skin. No discharge, no odor. Some irritation.  2 weeks ago she had her first cycle with the IUD (about 8 months). Heavy x 8 days. Saturated a super + tampon in 3 hours.   GYNECOLOGIC HISTORY: Patient's last menstrual period was 05/22/2017. Contraception:IUD Menopausal hormone therapy: none        OB History    Gravida Para Term Preterm AB Living   _0 SAB TAB Ectopic Multiple Live Births   1       2         Patient Active Problem List   Diagnosis Date Noted  . Unilateral primary osteoarthritis, left knee 11/05/2016  . Insulin dependent type 2 diabetes mellitus, uncontrolled ([place]) 12/19/2015  . Asthma, mild intermittent 12/19/2015  . Bilateral lower extremity edema 12/19/2015  . Rash and  nonspecific skin eruption 10/19/2013  . DJD (degenerative joint disease) of knee 06/29/2013  . Dysmenorrhea 03/30/2013  . Dyspnea 06/11/2010  . Morbid obesity ([place]) 12/21/2008  . Palpitations 12/21/2008  . GENITAL HERPES 03/22/2007  . HYPERLIPIDEMIA 03/22/2007  . Iron deficiency anemia 03/22/2007  . Anxiety disorder 03/22/2007  . DEPRESSION 03/22/2007  . Essential hypertension 03/22/2007  . ALLERGIC RHINITIS 03/22/2007  . [place] DISEASE, CERVICAL 03/22/2007  . POSITIVE PPD 03/22/2007    Past Medical History:  Diagnosis Date  . Allergic rhinitis   . Anemia   . Anxiety   . Arthritis   . Asthma, mild intermittent 12/19/2015  . Complication of anesthesia    difficulty waking up from anesthesia  . Depression   . Diabetes mellitus type 2 in obese ([place])   . Hyperlipidemia   . Hypertension   . Low blood pressure reading    noticed lately  . Morbid obesity ([place])   . Pneumonia    history of  . Sleep apnea    mild sleep, no CPAP needed at this time    Past Surgical History:  Procedure Laterality Date  . ABDOMINAL SURGERY     chronic abd wall abscess  . CESAREAN SECTION     x's 2  .  CHOLECYSTECTOMY    . DILATATION & CURETTAGE/HYSTEROSCOPY WITH MYOSURE N/A 12/08/2016   Procedure: DILATATION & CURETTAGE/HYSTEROSCOPY WITH MYOSURE;  Surgeon: Salvadore Dom, MD;  Location: [facility];  Service: Gynecology;  Laterality: N/A;  . INTRAUTERINE DEVICE (IUD) INSERTION N/A 12/08/2016   Procedure: MIRENA INTRAUTERINE DEVICE (IUD) INSERTION (IUD FROM OFFICE);  Surgeon: Salvadore Dom, MD;  Location: [facility];  Service: Gynecology;  Laterality: N/A;  . KNEE ARTHROSCOPY Right   . LIPOMA EXCISION  08/03/2011   Procedure: EXCISION LIPOMA;  Surgeon: Harl Bowie, MD;  Location: [facility];  Service: General;  Laterality: Right;  wide excision chronic abscess Right lower abdominal wall  . OOPHORECTOMY     R ovary removed at [facility]      Current Outpatient  Medications  Medication Sig Dispense Refill  . albuterol (PROVENTIL HFA;VENTOLIN HFA) 108 (90 Base) MCG/ACT inhaler Inhale 2 puffs into the lungs every 6 (six) hours as needed for wheezing or shortness of breath. 8.5 g 2  . atorvastatin (LIPITOR) 10 MG tablet TAKE 1 TABLET BY MOUTH EVERY DAY 90 tablet 0  . BAYER MICROLET LANCETS lancets Use as instructed to check sugar up to 6 times daily 600 each 5  . betamethasone valerate ointment (VALISONE) 0.1 % Apply 1 application topically daily as needed. ECZEMA  0  . Blood Glucose Monitoring Suppl (BAYER CONTOUR MONITOR) w/Device KIT Use to check sugar daily 1 each 0  . citalopram (CELEXA) 40 MG tablet TAKE 1 TABLET BY MOUTH EVERY DAY 30 tablet 0  . glucose blood (BAYER CONTOUR TEST) test strip Use as instructed to check sugar 6 times daily 600 each 5  . hydroquinone 4 % cream Apply 1 application topically 2 (two) times daily as needed (skin discoloration).    Marland Kitchen ibuprofen (ADVIL,MOTRIN) 600 MG tablet Take 600 mg by mouth every 6 (six) hours as needed for moderate pain.    Marland Kitchen insulin aspart (NOVOLOG FLEXPEN) 100 UNIT/ML FlexPen INJECT 25 UNITS INTO THE SKIN 3 (THREE) TIMES DAILY 15 min before MEALS. 90 mL 0  . Insulin Glargine (TOUJEO SOLOSTAR) 300 UNIT/ML SOPN Inject 75 Units into the skin daily. 21 pen 1  . Insulin Pen Needle 31G X 5 MM MISC Use 4x a day 400 each 3  . INVOKANA 300 MG TABS tablet TAKE 1 TABLET BY MOUTH EVERY DAY 90 tablet 3  . metFORMIN (GLUCOPHAGE-XR) 500 MG 24 hr tablet TAKE 2 TABLETS BY MOUTH TWICE A DAY 360 tablet 0  . metoprolol succinate (TOPROL-XL) 25 MG 24 hr tablet TAKE ONE TABLET BY MOUTH DAILY.( TAKE WITH 50 MG TO TOTAL 75 MG DAILY.) 90 tablet 2  . metoprolol succinate (TOPROL-XL) 50 MG 24 hr tablet TAKE ONE TABLET BY MOUTH DAILY (TAKE WITH 25 MG TABLET TO TOTAL 75 MG DAILY) 90 tablet 2  . Multiple Vitamins-Minerals (MULTIVITAMIN WITH MINERALS) tablet Take 1 tablet by mouth daily.    Marland Kitchen oxycodone (OXY-IR) 5 MG capsule Take 1  capsule (5 mg total) by mouth every 6 (six) hours as needed. 15 capsule 0  . furosemide (LASIX) 20 MG tablet Take 1 tablet (20 mg total) by mouth daily. 90 tablet 3  . spironolactone (ALDACTONE) 25 MG tablet Take 2 tablets (50 mg total) by mouth daily. 180 tablet 3   No current facility-administered medications for this visit.      ALLERGIES: Ramipril  Family History  Problem Relation Age of Onset  . Diabetes Mother   . Hypertension Mother   .  Other Mother        renal failure  . Diabetes Other   . Hypertension Father   . Cancer Father     Social History   Socioeconomic History  . Marital status: Legally Separated    Spouse name: Not on file  . Number of children: Not on file  . Years of education: Not on file  . Highest education level: Not on file  Social Needs  . Financial resource strain: Not on file  . Food insecurity - worry: Not on file  . Food insecurity - inability: Not on file  . Transportation needs - medical: Not on file  . Transportation needs - non-medical: Not on file  Occupational History  . Not on file  Tobacco Use  . Smoking status: Never Smoker  . Smokeless tobacco: Never Used  . Tobacco comment: Married  Substance and Sexual Activity  . Alcohol use: Yes    Comment: rare  . Drug use: No  . Sexual activity: Not Currently    Partners: Male    Birth control/protection: IUD  Other Topics Concern  . Not on file  Social History Narrative  . Not on file    Review of Systems  Constitutional: Negative.   HENT: Negative.   Eyes: Negative.   Respiratory: Negative.   Cardiovascular: Negative.   Gastrointestinal: Negative.   Genitourinary: Positive for dysuria and urgency.       Left side pelvic pain Dysmenorrhea Vaginal irritation, discharge and itching   Musculoskeletal: Negative.   Skin: Negative.   Neurological: Negative.   Endo/Heme/Allergies: Negative.   Psychiatric/Behavioral: Negative.     PHYSICAL EXAMINATION:    BP 128/80 (BP  Location: Right Arm, Patient Position: Sitting, Cuff Size: Large)   Pulse 88   Resp 18   Wt 294 lb (133.4 kg)   LMP 05/22/2017   BMI 48.92 kg/m     General appearance: alert, cooperative and appears stated age Abdomen: soft, tender in the suprapubic region; non distended, no masses,  no organomegaly  Pelvic: External genitalia:  no lesions, slight erythema              Urethra:  normal appearing urethra with no masses, tenderness or lesions              Bartholins and Skenes: normal                 Vagina: normal appearing vagina with normal color and discharge, no lesions              Cervix: no cervical motion tenderness, no lesions and IUD string 2 cm              Bimanual Exam:  Uterus:  exam is somewhat limited. Uterus is anteverted, not appreciably enlarged, tender (but bladder is very tender)              Adnexa: no masses, tender on the left              Rectovaginal: Yes.  .  Confirms.              Anus:  normal sphincter tone, no lesions Bladder: extremely tender  Chaperone was present for exam.  ASSESSMENT Pelvic pain, negative genprobe, normal CBC Bladder pain, "spasms". Urine dip only + for glucose ? Uterine tenderness Right pelvic fluid collection Dysuria Vulvitis Episode of abnormal uterine bleeding.   PLAN Urine for ua, c&s (urine culture from ER contaminated) Affirm Set up pelvic MRI  Will call in pyridium for 3 days (if pain resolves will send to urology) May need to f/u with urology even without response to pyridium for her bladder tenderness and urinary c/o Will treat with doxycycline for possible endometritis    An After Visit Summary was printed and given to the patient.

## 2017-06-09 ENCOUNTER — Telehealth: Payer: Self-pay | Admitting: *Deleted

## 2017-06-09 ENCOUNTER — Telehealth: Payer: Self-pay

## 2017-06-09 DIAGNOSIS — R102 Pelvic and perineal pain: Secondary | ICD-10-CM

## 2017-06-09 DIAGNOSIS — R6889 Other general symptoms and signs: Secondary | ICD-10-CM

## 2017-06-09 DIAGNOSIS — R198 Other specified symptoms and signs involving the digestive system and abdomen: Secondary | ICD-10-CM

## 2017-06-09 LAB — URINALYSIS, MICROSCOPIC ONLY
BACTERIA UA: NONE SEEN
Casts: NONE SEEN /lpf

## 2017-06-09 LAB — VAGINITIS/VAGINOSIS, DNA PROBE
CANDIDA SPECIES: POSITIVE — AB
GARDNERELLA VAGINALIS: POSITIVE — AB
TRICHOMONAS VAG: NEGATIVE

## 2017-06-09 LAB — URINE CULTURE

## 2017-06-09 NOTE — Telephone Encounter (Signed)
-----   Message from Romualdo Bolk, MD sent at 06/09/2017  9:06 AM EST ----- Please inform the patient that her vaginitis probe was + for BV and treat with flagyl, no ETOH while on Flagyl.  Oral: Flagyl 500 mg BID x 7 days. I prefer the oral medication for her because of her abdominal/pelvic pain Positive for yeast, please treat with diflucan 150 mg x 1, may repeat in 72 hours if still symptomatic. #2, no refills She should continue with the doxycyline that was prescribed yesterday

## 2017-06-09 NOTE — Telephone Encounter (Signed)
-----   Message from Romualdo Bolk, MD sent at 06/08/2017  5:44 PM EST ----- Please set this patient up for a pelvic MRI. See u/s report from last week, she has a fluid collection in the right pelvic, ? Ovarian remnant, severe pelvic pain. She does have a mirena IUD Thanks, Noreene Larsson

## 2017-06-09 NOTE — Telephone Encounter (Signed)
Order for pelvic MRI to [facility] placed for patient scheduling with [facility] and for preauthorization.

## 2017-06-09 NOTE — Telephone Encounter (Signed)
Notes recorded by Leda Min, RN on 06/09/2017 at 2:23 PM EST Left message to call Noreene Larsson at [phone].

## 2017-06-10 NOTE — Telephone Encounter (Signed)
Spoke with Elease Hashimoto at [facility] who is going to contact the patient directly to schedule Pelvic MRI at this time.

## 2017-06-11 MED ORDER — METRONIDAZOLE 500 MG PO TABS: 500.0000 mg | ORAL_TABLET | Freq: Two times a day (BID) | ORAL | 0 refills | Status: DC

## 2017-06-11 MED ORDER — FLUCONAZOLE 150 MG PO TABS: 150.0000 mg | ORAL_TABLET | Freq: Once | ORAL | 0 refills | Status: AC

## 2017-06-11 MED ORDER — AMOXICILLIN 500 MG PO TABS: 500.0000 mg | ORAL_TABLET | Freq: Three times a day (TID) | ORAL | 0 refills | Status: DC

## 2017-06-11 NOTE — Telephone Encounter (Signed)
Notes recorded by Leda Min, RN on 06/11/2017 at 11:48 AM EST Called mobile and work number on file. Left message on mobile to return call to [place], [place] at [facility] at [phone]. 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 See telephone encounter dated 06/09/17. ------  Notes recorded by Romualdo Bolk, MD on 06/10/2017 at 5:10 PM EST Please call the patient, it looks like she hasn't returned the call from 2 days ago about her vaginitis.  Her urine returned with GBS. This is often a contaminant from the vagina, but she was having some UTI symptoms. Please call in amoxicillin 500 mg q 8 hours x 3 days. Unfortunately the other antibiotics she is being prescribed won't cover GBS.

## 2017-06-11 NOTE — Telephone Encounter (Signed)
Spoke with patient. Advised patient [facility] will be contacting her to schedule her Pelvic MRI. Patient states she received a call and will have to call them back to schedule at this time. All results given as seen below from Dr.Jertson. Patient verbalizes understanding. Rx for Flagyl 500 mg po BID x 7 days #14 0RF, Diflucan 150 mg po x1 repeat as needed #2 0RF, and Amoxicillin 500 mg q 8 hours x 3 days #9 0RF sent to pharmacy on file. Avoid alcohol during treatment and 24 hours after completing medication. Don't mix with alcohol if mixed can cause severe nausea, vomiting and abdominal cramping. Patient verbalizes understanding. Will continue taking Doxycyline as well.  Notes recorded by Leda Min, RN on 06/11/2017 at 11:48 AM EST Called mobile and work number on file. Left message on mobile to return call to [place], [place] at [facility] at [phone]. 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 See telephone encounter dated 06/09/17. ------  Notes recorded by Romualdo Bolk, MD on 06/10/2017 at 5:10 PM EST Please call the patient, it looks like she hasn't returned the call from 2 days ago about her vaginitis.  Her urine returned with GBS. This is often a contaminant from the vagina, but she was having some UTI symptoms. Please call in amoxicillin 500 mg q 8 hours x 3 days. Unfortunately the other antibiotics she is being prescribed won't cover GBS. ------  Notes recorded by Leda Min, RN on 06/09/2017 at 2:23 PM EST Left message to call Noreene Larsson at [phone].  ------  Notes recorded by Romualdo Bolk, MD on 06/09/2017 at 9:06 AM EST Please inform the patient that her vaginitis probe was + for BV and treat with flagyl, no ETOH while on Flagyl.  Oral: Flagyl 500 mg BID x 7 days. I prefer the oral medication for her because of her abdominal/pelvic pain Positive for yeast, please treat with diflucan 150 mg x 1, may repeat in 72 hours if still symptomatic. #2,  no refills She should continue with the doxycyline that was prescribed yesterday  Routing to provider for final review. Patient agreeable to disposition. Will close encounter.

## 2017-06-11 NOTE — Telephone Encounter (Signed)
See telephone encounter dated 2/13, results reviewed with patient by K. Sprague, RN. Will close encounter.

## 2017-06-13 ENCOUNTER — Other Ambulatory Visit: Payer: Self-pay | Admitting: Internal Medicine

## 2017-06-13 DIAGNOSIS — Z794 Long term (current) use of insulin: Principal | ICD-10-CM

## 2017-06-13 DIAGNOSIS — E1165 Type 2 diabetes mellitus with hyperglycemia: Secondary | ICD-10-CM

## 2017-06-13 DIAGNOSIS — IMO0002 Reserved for concepts with insufficient information to code with codable children: Secondary | ICD-10-CM

## 2017-06-14 ENCOUNTER — Other Ambulatory Visit: Payer: Self-pay | Admitting: Family Medicine

## 2017-06-14 NOTE — Telephone Encounter (Signed)
Informed patient that per Dr. [place], she would refill medication on tomorrow, 06/15/2017 when she come in for her appointment. Patient verbalized understanding.

## 2017-06-14 NOTE — Telephone Encounter (Signed)
PT called back  But could not get office on phone, she is requesting office try to contact her again when available

## 2017-06-14 NOTE — Progress Notes (Signed)
HPI:   Ms.Janis D Swiney is a 52 y.o. female, who is here today for follow up.   She was last seen on 03/01/17 for acute visit. Last follow up visit in 01/2016.  Since her last visit she has been following regularly with her gynecologist, currently she is on 3 different Abx: Metronidazole, Doxycycline, and Amoxicillin.  Hx of DM II,she has been dismissed from Dr Arman Filter practice.  She is requesting refills on her medications.  Diabetes Mellitus II:   Currently on Metformin XR 500 mg bid,Toujeo 75 U daily, and Novolog 25 U TID. Checking BS's : "Good"  Hypoglycemia: x 2 BS's < 70 in the past month. She started "vitamin regimen", hoping to lose weight,"[place]."  She is tolerating medications well. She has not noted abdominal pain, nausea, vomiting, polydipsia, polyuria, or polyphagia. No numbness, tingling, or burning.  She would like a referral to another endocrinologist, she would like a provider at [facility].  Lab Results  Component Value Date   HGBA1C 6.7 (H) 12/02/2016   Lab Results  Component Value Date   MICROALBUR 0.1 11/16/2013    Hypertension:   Currently on Spironolactone 25 mg daily, Metoprolol Succinates is still following with Dr Tamala Julian, next appt in 06/2017.  Still on Furosemide 20 mg daily for edema.  She is taking medications as instructed, no side effects reported.  She has not noted unusual headache, visual changes, exertional chest pain, dyspnea,  focal weakness, or edema.   Lab Results  Component Value Date   CREATININE 0.74 03/01/2017   BUN 15 03/01/2017   NA 138 03/01/2017   K 4.3 03/01/2017   CL 105 03/01/2017   CO2 24 03/01/2017   Anxiety and depression: Currently she is on Citalopram 40 mg daily, which she has been taking for 13 years ago. She thinks it helps. If she misses a dose she feels anxious and depresses but still she doe snot want to take medication "all my life."  She denies suicidal thoughts. She is  tolerating medication well.  HLD:  Currently she is on Lipitor 10 mg daily. Tolerating medication well, denies side effects.  Lab Results  Component Value Date   CHOL 196 11/16/2013   HDL 35.80 (L) 11/16/2013   LDLCALC 126 (H) 11/16/2013   TRIG 172.0 (H) 11/16/2013   CHOLHDL 5 11/16/2013    She is not exercising regularly. She is trying to eat healthier for the past 2 months.    Review of Systems  Constitutional: Negative for activity change, appetite change, fatigue and fever.  HENT: Negative for mouth sores, nosebleeds and trouble swallowing.   Eyes: Negative for redness and visual disturbance.  Respiratory: Positive for cough (recovering from a cold). Negative for shortness of breath and wheezing.   Cardiovascular: Negative for chest pain, palpitations and leg swelling.  Gastrointestinal: Negative for abdominal pain, nausea and vomiting.       Negative for changes in bowel habits.  Endocrine: Negative for cold intolerance, heat intolerance, polydipsia, polyphagia and polyuria.  Genitourinary: Negative for decreased urine volume and hematuria.  Musculoskeletal: Negative for gait problem and myalgias.  Skin: Negative for rash and wound.  Neurological: Negative for syncope, weakness, numbness and headaches.  Psychiatric/Behavioral: Negative for confusion. The patient is nervous/anxious.      Current Outpatient Medications on File Prior to Visit  Medication Sig Dispense Refill  . albuterol (PROVENTIL HFA;VENTOLIN HFA) 108 (90 Base) MCG/ACT inhaler Inhale 2 puffs into the lungs every 6 (six) hours  as needed for wheezing or shortness of breath. 8.5 g 2  . amoxicillin (AMOXIL) 500 MG tablet Take 1 tablet (500 mg total) by mouth 3 (three) times daily. 9 tablet 0  . BAYER MICROLET LANCETS lancets Use as instructed to check sugar up to 6 times daily 600 each 5  . betamethasone valerate ointment (VALISONE) 0.1 % Apply 1 application topically daily as needed. ECZEMA  0  . Blood  Glucose Monitoring Suppl (BAYER CONTOUR MONITOR) w/Device KIT Use to check sugar daily 1 each 0  . doxycycline (VIBRAMYCIN) 100 MG capsule Take 1 capsule (100 mg total) by mouth 2 (two) times daily. Take BID for 10 days.  Take with food as can cause GI distress. 20 capsule 0  . glucose blood (BAYER CONTOUR TEST) test strip Use as instructed to check sugar 6 times daily 600 each 5  . hydroquinone 4 % cream Apply 1 application topically 2 (two) times daily as needed (skin discoloration).    Marland Kitchen ibuprofen (ADVIL,MOTRIN) 600 MG tablet Take 600 mg by mouth every 6 (six) hours as needed for moderate pain.    Marland Kitchen insulin aspart (NOVOLOG FLEXPEN) 100 UNIT/ML FlexPen INJECT 25 UNITS INTO THE SKIN 3 (THREE) TIMES DAILY 15 min before MEALS. 90 mL 0  . Insulin Pen Needle 31G X 5 MM MISC Use 4x a day 400 each 3  . INVOKANA 300 MG TABS tablet TAKE 1 TABLET BY MOUTH EVERY DAY 90 tablet 3  . metFORMIN (GLUCOPHAGE-XR) 500 MG 24 hr tablet TAKE 2 TABLETS BY MOUTH TWICE A DAY 360 tablet 0  . metoprolol succinate (TOPROL-XL) 25 MG 24 hr tablet TAKE ONE TABLET BY MOUTH DAILY.( TAKE WITH 50 MG TO TOTAL 75 MG DAILY.) 90 tablet 2  . metoprolol succinate (TOPROL-XL) 50 MG 24 hr tablet TAKE ONE TABLET BY MOUTH DAILY (TAKE WITH 25 MG TABLET TO TOTAL 75 MG DAILY) 90 tablet 2  . metroNIDAZOLE (FLAGYL) 500 MG tablet Take 1 tablet (500 mg total) by mouth 2 (two) times daily. 14 tablet 0  . Multiple Vitamins-Minerals (MULTIVITAMIN WITH MINERALS) tablet Take 1 tablet by mouth daily.    . phenazopyridine (PYRIDIUM) 200 MG tablet Take 1 tablet (200 mg total) by mouth 3 (three) times daily as needed. 6 tablet 0  . furosemide (LASIX) 20 MG tablet Take 1 tablet (20 mg total) by mouth daily. 90 tablet 3  . spironolactone (ALDACTONE) 25 MG tablet Take 2 tablets (50 mg total) by mouth daily. 180 tablet 3   No current facility-administered medications on file prior to visit.      Past Medical History:  Diagnosis Date  . Allergic rhinitis     . Anemia   . Anxiety   . Arthritis   . Asthma, mild intermittent 12/19/2015  . Complication of anesthesia    difficulty waking up from anesthesia  . Depression   . Diabetes mellitus type 2 in obese ([place])   . Hyperlipidemia   . Hypertension   . Low blood pressure reading    noticed lately  . Morbid obesity ([place])   . Pneumonia    history of  . Sleep apnea    mild sleep, no CPAP needed at this time   Allergies  Allergen Reactions  . Ramipril Shortness Of Breath    Social History   Socioeconomic History  . Marital status: Legally Separated    Spouse name: None  . Number of children: None  . Years of education: None  . Highest education level:  None  Social Needs  . Financial resource strain: None  . Food insecurity - worry: None  . Food insecurity - inability: None  . Transportation needs - medical: None  . Transportation needs - non-medical: None  Occupational History  . None  Tobacco Use  . Smoking status: Never Smoker  . Smokeless tobacco: Never Used  . Tobacco comment: Married  Substance and Sexual Activity  . Alcohol use: Yes    Comment: rare  . Drug use: No  . Sexual activity: Not Currently    Partners: Male    Birth control/protection: IUD  Other Topics Concern  . None  Social History Narrative  . None    Vitals:   06/15/17 1404  BP: 130/82  Pulse: 100  Resp: 16  Temp: 98.5 F (36.9 C)  SpO2: 97%   Body mass index is 48.42 kg/m. Wt Readings from Last 3 Encounters:  06/15/17 291 lb (132 kg)  06/08/17 294 lb (133.4 kg)  06/04/17 297 lb (134.7 kg)     Physical Exam  Nursing note and vitals reviewed. Constitutional: She is oriented to person, place, and time. She appears well-developed. No distress.  HENT:  Head: Normocephalic and atraumatic.  Mouth/Throat: Oropharynx is clear and moist and mucous membranes are normal.  Eyes: Conjunctivae are normal. Pupils are equal, round, and reactive to light.  Cardiovascular: Normal rate and regular  rhythm.  No murmur heard. Pulses:      Dorsalis pedis pulses are 2+ on the right side, and 2+ on the left side.  Respiratory: Effort normal and breath sounds normal. No respiratory distress.  GI: Soft. She exhibits no mass. There is no hepatomegaly. There is no tenderness.  Musculoskeletal: She exhibits no edema.  Lymphadenopathy:    She has no cervical adenopathy.  Neurological: She is alert and oriented to person, place, and time. She has normal strength. Gait normal.  Skin: Skin is warm. No erythema.  Psychiatric: She has a normal mood and affect.  Well groomed, good eye contact.   Diabetic Foot Exam - Simple   Simple Foot Form Diabetic Foot exam was performed with the following findings:  Yes 06/15/2017  2:37 PM  Visual Inspection See comments:  Yes Sensation Testing Intact to touch and monofilament testing bilaterally:  Yes Pulse Check Posterior Tibialis and Dorsalis pulse intact bilaterally:  Yes Comments Mild bunions bilateral.      ASSESSMENT AND PLAN:   Ms. KRISTIANA JACKO was seen today for follow-up.  Orders Placed This Encounter  Procedures  . Basic metabolic panel  . Lipid panel  . Hemoglobin A1c  . Fructosamine  . Microalbumin / creatinine urine ratio  . Ambulatory referral to Endocrinology   Lab Results  Component Value Date   HGBA1C 8.4 (H) 06/15/2017   Lab Results  Component Value Date   MICROALBUR 1.8 06/15/2017   Lab Results  Component Value Date   CREATININE 0.59 06/15/2017   BUN 12 06/15/2017   NA 136 06/15/2017   K 3.9 06/15/2017   CL 102 06/15/2017   CO2 26 06/15/2017   Lab Results  Component Value Date   CHOL 192 06/15/2017   HDL 33.90 (L) 06/15/2017   LDLCALC 132 (H) 06/15/2017   TRIG 129.0 06/15/2017   CHOLHDL 6 06/15/2017    Insulin dependent type 2 diabetes mellitus, uncontrolled ([place]) HgA1C has been at goal. No changes in current management, pending HgA1C today. Regular exercise and healthy diet with  avoidance of added sugar food intake is an  important part of treatment and recommended. Referral to new endocrinologist placed as requested. Annual eye exam, periodic dental and foot care recommended.    Hyperlipidemia associated with type 2 diabetes mellitus (HCC) No changes in current management, will follow labs done today and will give further recommendations accordingly. Follow-up in 6-12 months.  Essential hypertension Otherwise adequately controlled. No changes in current management. Low-salt diet. Eye exam is current. Keep next appointment with Dr. Tamala Julian and I will see her back in a year, before if needed.   Anxiety disorder She would like to try to wean off medication. She will alternate Celexa 40 and 20 mg every other day for a month, if was tolerated she will continue 20 mg daily. She was instructed to let me know how she does with titrating medication down. If anxiety or depression slightly worse, she was instructed to go back to current dose. Instructed about warning signs. Follow-up annually, before if needed.  Morbid obesity (HCC) Benefits of wt loss discussed. Caution advise with OTC dietary supplements. Consistency with healthy diet and physical activity recommended.      -Ms. Nakya Weyand Boykin-Ferguson was advised to return sooner than planned today if new concerns arise.       Betty G. Martinique, MD  [facility] [place] office.

## 2017-06-14 NOTE — Telephone Encounter (Signed)
Pt requesting a refill for Insulin Glargine (Toujeo Solostar) 300 units/ml sopn.   LOV  03/01/17  HA1C on  12/02/16 was 6.7  Pharmacy:  [facility] [phone], [address] [facility]  She states she has been out of Insulin for the last 4 days. Please review.

## 2017-06-14 NOTE — Telephone Encounter (Signed)
She should be following with endocrinologist. I have not managed her DM II.  She needs a follow up appt for the other problems, I have seen her prn for acute problems.  Thanks, BJ

## 2017-06-14 NOTE — Telephone Encounter (Signed)
Copied from CRM [id number]. Topic: Quick Communication - Rx Refill/Question >> Jun 14, 2017 12:02 PM Crist Infante wrote: Medication:  Insulin Glargine (TOUJEO SOLOSTAR) 300 UNIT/ML SOPN  Has the patient contacted their pharmacy? yes but dr Lafe Garin is not seeing pt anymore and Dr Swaziland is taking over Pharmacy sent to the wrong dr and pt states she has been out of her insulin 4 days, and very concerned about this. (Did not know it wasn't sent to Dr Swaziland) Pt has an appt to see Dr Swaziland tomorrow, but would like insulin sent in today  please  [id number] - [place], [place] - [address] AT [facility] [address] [phone] (Phone) [phone] (Fax)

## 2017-06-14 NOTE — Telephone Encounter (Signed)
Left message to give clinic a call back concerning medication refill. 

## 2017-06-14 NOTE — Telephone Encounter (Signed)
Contacted pt regarding refill and given note per Dr Swaziland, that she should be following an endocrinologist; pt states that she does not have an endocrinologist; contacted Sheena at [facility] and she verified that the pt is seen by Dr Elvera Lennox, [facility] however the pt says that Dr Elvera Lennox is not managing her; spoke with Jill Alexanders at practice and he will contact Dr Elvera Lennox for instruction; pt can be contacted at [phone].

## 2017-06-15 ENCOUNTER — Encounter: Payer: Self-pay | Admitting: Family Medicine

## 2017-06-15 ENCOUNTER — Ambulatory Visit: Payer: BLUE CROSS/BLUE SHIELD | Admitting: Family Medicine

## 2017-06-15 VITALS — BP 130/82 | HR 100 | Temp 98.5°F | Resp 16 | Ht 65.0 in | Wt 291.0 lb

## 2017-06-15 DIAGNOSIS — E785 Hyperlipidemia, unspecified: Secondary | ICD-10-CM

## 2017-06-15 DIAGNOSIS — E1169 Type 2 diabetes mellitus with other specified complication: Secondary | ICD-10-CM | POA: Diagnosis not present

## 2017-06-15 DIAGNOSIS — Z794 Long term (current) use of insulin: Secondary | ICD-10-CM | POA: Diagnosis not present

## 2017-06-15 DIAGNOSIS — F418 Other specified anxiety disorders: Secondary | ICD-10-CM | POA: Diagnosis not present

## 2017-06-15 DIAGNOSIS — I10 Essential (primary) hypertension: Secondary | ICD-10-CM

## 2017-06-15 DIAGNOSIS — E1165 Type 2 diabetes mellitus with hyperglycemia: Secondary | ICD-10-CM

## 2017-06-15 DIAGNOSIS — IMO0002 Reserved for concepts with insufficient information to code with codable children: Secondary | ICD-10-CM

## 2017-06-15 LAB — BASIC METABOLIC PANEL
BUN: 12 mg/dL (ref 6–23)
CHLORIDE: 102 meq/L (ref 96–112)
CO2: 26 meq/L (ref 19–32)
CREATININE: 0.59 mg/dL (ref 0.40–1.20)
Calcium: 9.9 mg/dL (ref 8.4–10.5)
GFR: 138.97 mL/min (ref >60.00)
Glucose, Bld: 171 mg/dL — ABNORMAL HIGH (ref 70–99)
POTASSIUM: 3.9 meq/L (ref 3.5–5.1)
SODIUM: 136 meq/L (ref 135–145)

## 2017-06-15 LAB — LIPID PANEL
CHOLESTEROL: 192 mg/dL (ref 0–200)
HDL: 33.9 mg/dL — AB (ref >39.00)
LDL CALC: 132 mg/dL — AB (ref 0–99)
NonHDL: 158.03
TRIGLYCERIDES: 129 mg/dL (ref 0.0–149.0)
Total CHOL/HDL Ratio: 6
VLDL: 25.8 mg/dL (ref 0.0–40.0)

## 2017-06-15 LAB — HEMOGLOBIN A1C: Hgb A1c MFr Bld: 8.4 % — ABNORMAL HIGH (ref 4.6–6.5)

## 2017-06-15 LAB — MICROALBUMIN / CREATININE URINE RATIO
Creatinine,U: 42.6 mg/dL
MICROALB UR: 1.8 mg/dL (ref 0.0–1.9)
Microalb Creat Ratio: 4.3 mg/g (ref 0.0–30.0)

## 2017-06-15 MED ORDER — ATORVASTATIN CALCIUM 10 MG PO TABS: 10.0000 mg | ORAL_TABLET | Freq: Every day | ORAL | 3 refills | Status: DC

## 2017-06-15 MED ORDER — INSULIN GLARGINE 300 UNIT/ML ~~LOC~~ SOPN: 75.0000 [IU] | PEN_INJECTOR | Freq: Every day | SUBCUTANEOUS | 2 refills | Status: DC

## 2017-06-15 MED ORDER — CITALOPRAM HYDROBROMIDE 40 MG PO TABS: 40.0000 mg | ORAL_TABLET | Freq: Every day | ORAL | 2 refills | Status: DC

## 2017-06-15 NOTE — Assessment & Plan Note (Signed)
No changes in current management, will follow labs done today and will give further recommendations accordingly. Follow-up in 6-12 months. 

## 2017-06-15 NOTE — Assessment & Plan Note (Signed)
She would like to try to wean off medication. She will alternate Celexa 40 and 20 mg every other day for a month, if was tolerated she will continue 20 mg daily. She was instructed to let me know how she does with titrating medication down. If anxiety or depression slightly worse, she was instructed to go back to current dose. Instructed about warning signs. Follow-up annually, before if needed.

## 2017-06-15 NOTE — Assessment & Plan Note (Signed)
Benefits of wt loss discussed. Caution advise with OTC dietary supplements. Consistency with healthy diet and physical activity recommended.

## 2017-06-15 NOTE — Assessment & Plan Note (Signed)
HgA1C has been at goal. No changes in current management, pending HgA1C today. Regular exercise and healthy diet with avoidance of added sugar food intake is an important part of treatment and recommended. Referral to new endocrinologist placed as requested. Annual eye exam, periodic dental and foot care recommended.

## 2017-06-15 NOTE — Assessment & Plan Note (Signed)
Otherwise adequately controlled. No changes in current management. Low-salt diet. Eye exam is current. Keep next appointment with Dr. Katrinka Blazing and I will see her back in a year, before if needed.

## 2017-06-15 NOTE — Patient Instructions (Addendum)
  A few things to remember from today's visit:   Insulin dependent type 2 diabetes mellitus, uncontrolled (HCC) - Plan: Insulin Glargine (TOUJEO SOLOSTAR) 300 UNIT/ML SOPN, Ambulatory referral to Endocrinology, Hemoglobin A1c, Fructosamine, Microalbumin / creatinine urine ratio  Essential hypertension - Plan: Basic metabolic panel  Hyperlipidemia associated with type 2 diabetes mellitus (HCC) - Plan: atorvastatin (LIPITOR) 10 MG tablet, Lipid panel  Other specified anxiety disorders  Anxiety disorder, unspecified - Plan: citalopram (CELEXA) 40 MG tablet  Since you will be following with endo,you follow with Dr Katrinka Blazing, I think I ca see you annually.   Please be sure medication list is accurate. If a new problem present, please set up appointment sooner than planned today.        Celexa alternating between 40 mg and 20 mg for a month then 20 mg.

## 2017-06-17 LAB — FRUCTOSAMINE: FRUCTOSAMINE: 239 umol/L (ref 190–270)

## 2017-06-18 ENCOUNTER — Encounter: Payer: Self-pay | Admitting: Family Medicine

## 2017-07-01 ENCOUNTER — Encounter: Payer: Self-pay | Admitting: Obstetrics and Gynecology

## 2017-07-01 ENCOUNTER — Ambulatory Visit: Payer: BLUE CROSS/BLUE SHIELD | Admitting: Obstetrics and Gynecology

## 2017-07-28 ENCOUNTER — Other Ambulatory Visit: Payer: Self-pay | Admitting: Internal Medicine

## 2017-07-28 ENCOUNTER — Other Ambulatory Visit: Payer: Self-pay | Admitting: Family Medicine

## 2017-07-28 DIAGNOSIS — IMO0002 Reserved for concepts with insufficient information to code with codable children: Secondary | ICD-10-CM

## 2017-07-28 DIAGNOSIS — Z794 Long term (current) use of insulin: Principal | ICD-10-CM

## 2017-07-28 DIAGNOSIS — E1165 Type 2 diabetes mellitus with hyperglycemia: Secondary | ICD-10-CM

## 2017-08-04 DIAGNOSIS — H10413 Chronic giant papillary conjunctivitis, bilateral: Secondary | ICD-10-CM | POA: Diagnosis not present

## 2017-08-04 DIAGNOSIS — E119 Type 2 diabetes mellitus without complications: Secondary | ICD-10-CM | POA: Diagnosis not present

## 2017-08-04 LAB — HM DIABETES EYE EXAM

## 2017-08-04 NOTE — Telephone Encounter (Signed)
She was supposed to get established with endocrinologist. I will send a Rx but please explain that future refills request need to be sent to her endocrinologist's office.  Thanks, BJ

## 2017-08-06 NOTE — Telephone Encounter (Signed)
Unable to leave a message at the pts cell number due to voicemail being full. 

## 2017-08-09 NOTE — Telephone Encounter (Signed)
Tried calling patient, unable to leave message, mailbox full. 

## 2017-08-10 NOTE — Telephone Encounter (Signed)
Tried calling patient, unable to leave voicemail, mailbox full.

## 2017-08-11 ENCOUNTER — Encounter: Payer: Self-pay | Admitting: Family Medicine

## 2017-08-11 ENCOUNTER — Ambulatory Visit: Payer: BLUE CROSS/BLUE SHIELD | Admitting: Family Medicine

## 2017-08-11 VITALS — BP 126/82 | HR 86 | Temp 98.4°F | Resp 12 | Ht 65.0 in | Wt 290.0 lb

## 2017-08-11 DIAGNOSIS — Z794 Long term (current) use of insulin: Secondary | ICD-10-CM | POA: Diagnosis not present

## 2017-08-11 DIAGNOSIS — E1169 Type 2 diabetes mellitus with other specified complication: Secondary | ICD-10-CM

## 2017-08-11 DIAGNOSIS — IMO0002 Reserved for concepts with insufficient information to code with codable children: Secondary | ICD-10-CM

## 2017-08-11 DIAGNOSIS — E785 Hyperlipidemia, unspecified: Secondary | ICD-10-CM

## 2017-08-11 DIAGNOSIS — L309 Dermatitis, unspecified: Secondary | ICD-10-CM | POA: Diagnosis not present

## 2017-08-11 DIAGNOSIS — L98491 Non-pressure chronic ulcer of skin of other sites limited to breakdown of skin: Secondary | ICD-10-CM | POA: Diagnosis not present

## 2017-08-11 DIAGNOSIS — E1165 Type 2 diabetes mellitus with hyperglycemia: Secondary | ICD-10-CM | POA: Diagnosis not present

## 2017-08-11 MED ORDER — ATORVASTATIN CALCIUM 40 MG PO TABS
40.0000 mg | ORAL_TABLET | Freq: Every day | ORAL | 2 refills | Status: DC
Start: 2017-08-11 — End: 2018-04-25

## 2017-08-11 MED ORDER — CLOBETASOL PROPIONATE 0.05 % EX CREA
1.0000 | TOPICAL_CREAM | Freq: Two times a day (BID) | CUTANEOUS | 1 refills | Status: DC
Start: 2017-08-11 — End: 2018-03-03

## 2017-08-11 MED ORDER — SILVER SULFADIAZINE 1 % EX CREA: 1.0000 "application " | TOPICAL_CREAM | Freq: Every day | CUTANEOUS | 0 refills | Status: DC

## 2017-08-11 NOTE — Patient Instructions (Addendum)
A few things to remember from today's visit:   Skin ulcer, limited to breakdown of skin (HCC) - Plan: silver sulfADIAZINE (SILVADENE) 1 % cream  Cellulitis of left lower extremity - Plan: silver sulfADIAZINE (SILVADENE) 1 % cream  Eczema, unspecified type - Plan: clobetasol cream (TEMOVATE) 0.05 %  Hyperlipidemia associated with type 2 diabetes mellitus (HCC) - Plan: atorvastatin (LIPITOR) 40 MG tablet  If lesion is not resolving a few weeks, about 3 weeks, please schedule appointment with your dermatologist, you may need a biopsy. We need to better control your diabetes. Please arrange appointment with endocrinologist. Clobetasol is a steroid just to use on your legs.    Please be sure medication list is accurate. If a new problem present, please set up appointment sooner than planned today.

## 2017-08-11 NOTE — Telephone Encounter (Signed)
Patient stated that she does not follow with endocrinology because at the appointment they told her that the NP would be following her care and she prefers to be seen by an MD, not a PA or NP.

## 2017-08-11 NOTE — Progress Notes (Signed)
ACUTE VISIT   HPI:  Chief Complaint  Patient presents with  . Open Wound on Left Ankle    noticed 2 weeks ago, painful, had to take pain meds  . Eczema    flare-up on legs    Ms.TAHNI PORCHIA is a 52 y.o. female, who is here today complaining of tender skin lesion on left ankle,which she noted about 2 weeks ago. Started like a small cut, papular,erythematous,and now crusty.   Pain is constant,sharp,radiated to pretibial area left LE. She is concerned because lesion is taking long time to heal.  Slowly improving.  No Hx of trauma.  No fever but chills. She is taking Ibuprofen 800 mg daily for pain.  DM II insulin dependent.  Lab Results  Component Value Date   HGBA1C 8.4 (H) 06/15/2017    She has not noted LE numbness,tingling,or burning.  BS's "better" since her last OV. She requested referral to a different endocrinologists, she did not arrange appt because she does not want to see a PA or NP.  Pruritic rash:  Hx of eczema, she is established with dermatologist.  C/O non erythematous ,pruritic rash on LE's,pretibial. Rash is not tender. No exacerbating or alleviating factors.  HLD:  In 05/2017 Atorvastatin dose increased from 20 mg to 40 mg, she has tolerated medication well.   Lab Results  Component Value Date   CHOL 192 06/15/2017   HDL 33.90 (L) 06/15/2017   LDLCALC 132 (H) 06/15/2017   TRIG 129.0 06/15/2017   CHOLHDL 6 06/15/2017    Review of Systems  Constitutional: Positive for chills. Negative for appetite change, fatigue and fever.  HENT: Negative for mouth sores.   Respiratory: Negative for cough, shortness of breath and wheezing.   Cardiovascular: Negative for chest pain, palpitations and leg swelling.  Gastrointestinal: Negative for abdominal pain, nausea and vomiting.  Endocrine: Negative for polydipsia, polyphagia and polyuria.  Musculoskeletal: Negative for gait problem and myalgias.  Skin: Positive for rash.  Negative for wound.  Allergic/Immunologic: Positive for environmental allergies.  Neurological: Negative for syncope, weakness, numbness and headaches.  Psychiatric/Behavioral: Negative for confusion. The patient is nervous/anxious.       Current Outpatient Medications on File Prior to Visit  Medication Sig Dispense Refill  . albuterol (PROVENTIL HFA;VENTOLIN HFA) 108 (90 Base) MCG/ACT inhaler Inhale 2 puffs into the lungs every 6 (six) hours as needed for wheezing or shortness of breath. 8.5 g 2  . amoxicillin (AMOXIL) 500 MG tablet Take 1 tablet (500 mg total) by mouth 3 (three) times daily. 9 tablet 0  . BAYER MICROLET LANCETS lancets Use as instructed to check sugar up to 6 times daily 600 each 5  . betamethasone valerate ointment (VALISONE) 0.1 % Apply 1 application topically daily as needed. ECZEMA  0  . Blood Glucose Monitoring Suppl (BAYER CONTOUR MONITOR) w/Device KIT Use to check sugar daily 1 each 0  . citalopram (CELEXA) 40 MG tablet Take 1 tablet (40 mg total) by mouth daily. 90 tablet 2  . doxycycline (VIBRAMYCIN) 100 MG capsule Take 1 capsule (100 mg total) by mouth 2 (two) times daily. Take BID for 10 days.  Take with food as can cause GI distress. 20 capsule 0  . glucose blood (BAYER CONTOUR TEST) test strip Use as instructed to check sugar 6 times daily 600 each 5  . hydroquinone 4 % cream Apply 1 application topically 2 (two) times daily as needed (skin discoloration).    Marland Kitchen ibuprofen (ADVIL,MOTRIN)  600 MG tablet Take 600 mg by mouth every 6 (six) hours as needed for moderate pain.    Marland Kitchen insulin aspart (NOVOLOG FLEXPEN) 100 UNIT/ML FlexPen 25 U tid 15 min before meals. 30 mL 0  . Insulin Glargine (TOUJEO SOLOSTAR) 300 UNIT/ML SOPN Inject 75 Units into the skin daily. 21 pen 2  . Insulin Pen Needle 31G X 5 MM MISC Use 4x a day 400 each 3  . INVOKANA 300 MG TABS tablet TAKE 1 TABLET BY MOUTH EVERY DAY 90 tablet 3  . metFORMIN (GLUCOPHAGE-XR) 500 MG 24 hr tablet TAKE 2 TABLETS  BY MOUTH TWICE A DAY 360 tablet 0  . metoprolol succinate (TOPROL-XL) 25 MG 24 hr tablet TAKE ONE TABLET BY MOUTH DAILY.( TAKE WITH 50 MG TO TOTAL 75 MG DAILY.) 90 tablet 2  . metoprolol succinate (TOPROL-XL) 50 MG 24 hr tablet TAKE ONE TABLET BY MOUTH DAILY (TAKE WITH 25 MG TABLET TO TOTAL 75 MG DAILY) 90 tablet 2  . metroNIDAZOLE (FLAGYL) 500 MG tablet Take 1 tablet (500 mg total) by mouth 2 (two) times daily. 14 tablet 0  . Multiple Vitamins-Minerals (MULTIVITAMIN WITH MINERALS) tablet Take 1 tablet by mouth daily.    . phenazopyridine (PYRIDIUM) 200 MG tablet Take 1 tablet (200 mg total) by mouth 3 (three) times daily as needed. 6 tablet 0  . furosemide (LASIX) 20 MG tablet Take 1 tablet (20 mg total) by mouth daily. 90 tablet 3  . spironolactone (ALDACTONE) 25 MG tablet Take 2 tablets (50 mg total) by mouth daily. 180 tablet 3   No current facility-administered medications on file prior to visit.      Past Medical History:  Diagnosis Date  . Allergic rhinitis   . Anemia   . Anxiety   . Arthritis   . Asthma, mild intermittent 12/19/2015  . Complication of anesthesia    difficulty waking up from anesthesia  . Depression   . Diabetes mellitus type 2 in obese ([place])   . Hyperlipidemia   . Hypertension   . Low blood pressure reading    noticed lately  . Morbid obesity ([place])   . Pneumonia    history of  . Sleep apnea    mild sleep, no CPAP needed at this time   Allergies  Allergen Reactions  . Ramipril Shortness Of Breath    Social History   Socioeconomic History  . Marital status: Legally Separated    Spouse name: Not on file  . Number of children: Not on file  . Years of education: Not on file  . Highest education level: Not on file  Occupational History  . Not on file  Social Needs  . Financial resource strain: Not on file  . Food insecurity:    Worry: Not on file    Inability: Not on file  . Transportation needs:    Medical: Not on file    Non-medical: Not on  file  Tobacco Use  . Smoking status: Never Smoker  . Smokeless tobacco: Never Used  . Tobacco comment: Married  Substance and Sexual Activity  . Alcohol use: Yes    Comment: rare  . Drug use: No  . Sexual activity: Not Currently    Partners: Male    Birth control/protection: IUD  Lifestyle  . Physical activity:    Days per week: Not on file    Minutes per session: Not on file  . Stress: Not on file  Relationships  . Social connections:  Talks on phone: Not on file    Gets together: Not on file    Attends religious service: Not on file    Active member of club or organization: Not on file    Attends meetings of clubs or organizations: Not on file    Relationship status: Not on file  Other Topics Concern  . Not on file  Social History Narrative  . Not on file    Vitals:   08/11/17 1542  BP: 126/82  Pulse: 86  Resp: 12  Temp: 98.4 F (36.9 C)  SpO2: 99%   Body mass index is 48.26 kg/m.    Physical Exam  Nursing note and vitals reviewed. Constitutional: She is oriented to person, place, and time. She appears well-developed. No distress.  HENT:  Head: Normocephalic and atraumatic.  Mouth/Throat: Oropharynx is clear and moist and mucous membranes are normal.  Eyes: Conjunctivae are normal.  Cardiovascular: Normal rate and regular rhythm.  No murmur heard. Pulses:      Dorsalis pedis pulses are 2+ on the right side, and 2+ on the left side.  Respiratory: Effort normal and breath sounds normal. No respiratory distress.  Musculoskeletal: She exhibits tenderness. She exhibits no edema.       Feet:  Neurological: She is alert and oriented to person, place, and time.  Skin: Skin is warm. Rash noted. Rash is pustular. There is erythema.     Above left medial ankle about 1 cm raise lesion with thick crust and superficial excoriation in center. Mild surrounding erythema, no induration or fluctuant area. No drainage. Tender with palpation. See MS  graphic.  Pretibial micropapular,non erythematous,non tender rash.  Psychiatric: Her mood appears anxious. Her affect is labile.  Well groomed, good eye contact.      ASSESSMENT AND PLAN:  Ms. Amara was seen today for open wound on left ankle and eczema.  Diagnoses and all orders for this visit:  Skin ulcer, limited to breakdown of skin ([place])  Slowly improving. Mild surrounding erythema, recommend topical abx. Monitor for worsening signs. Better DM controlled. We discussed some complications of poorly controlled DM.  -     silver sulfADIAZINE (SILVADENE) 1 % cream; Apply 1 application topically daily.  Eczema, unspecified type  Topical steroid just to use on LE's and for up to 14 days at the time.  -     clobetasol cream (TEMOVATE) 0.05 %; Apply 1 application topically 2 (two) times daily.  Hyperlipidemia associated with type 2 diabetes mellitus ([facility])  Atorvastatin 40 mg sent to her pharmacy. She is supposed to have this follow up by endocrinologist, Dr Altheimer. FLP needs to be repeated in 3-4 months.  -     atorvastatin (LIPITOR) 40 MG tablet; Take 1 tablet (40 mg total) by mouth daily.  Insulin dependent type 2 diabetes mellitus, uncontrolled ([place])  Not well controlled. Recommend arranging appt with endocrinologists, she needs f/u in 09/2017. Referral was placed last OV, I do not think another referral is needed.   Return if symptoms worsen or fail to improve.        Terrian Ridlon G. [place], MD  [facility]. [place] office.

## 2017-08-17 ENCOUNTER — Encounter: Payer: Self-pay | Admitting: Family Medicine

## 2017-08-24 ENCOUNTER — Other Ambulatory Visit: Payer: Self-pay

## 2017-08-24 DIAGNOSIS — R6889 Other general symptoms and signs: Secondary | ICD-10-CM

## 2017-08-24 DIAGNOSIS — R198 Other specified symptoms and signs involving the digestive system and abdomen: Secondary | ICD-10-CM

## 2017-08-24 DIAGNOSIS — R102 Pelvic and perineal pain: Secondary | ICD-10-CM

## 2017-08-25 ENCOUNTER — Telehealth: Payer: Self-pay | Admitting: Obstetrics and Gynecology

## 2017-08-25 NOTE — Telephone Encounter (Signed)
Spoke with patient regarding benefit for ultrasound. Patient understood and agreeable. Patient ready to schedule. Patient scheduled 08/31/17 with Dr Oscar La. Patient aware of appointment date, arrival time and cancellation policy.  No further questions.   Will close encounters  Forwarding to Dr Oscar La for final review

## 2017-08-27 ENCOUNTER — Encounter (HOSPITAL_BASED_OUTPATIENT_CLINIC_OR_DEPARTMENT_OTHER): Payer: BLUE CROSS/BLUE SHIELD | Attending: Internal Medicine

## 2017-08-27 DIAGNOSIS — G4730 Sleep apnea, unspecified: Secondary | ICD-10-CM | POA: Diagnosis not present

## 2017-08-27 DIAGNOSIS — E1165 Type 2 diabetes mellitus with hyperglycemia: Secondary | ICD-10-CM | POA: Diagnosis not present

## 2017-08-27 DIAGNOSIS — L97328 Non-pressure chronic ulcer of left ankle with other specified severity: Secondary | ICD-10-CM | POA: Insufficient documentation

## 2017-08-27 DIAGNOSIS — L309 Dermatitis, unspecified: Secondary | ICD-10-CM | POA: Diagnosis not present

## 2017-08-27 DIAGNOSIS — S91002A Unspecified open wound, left ankle, initial encounter: Secondary | ICD-10-CM | POA: Diagnosis not present

## 2017-08-27 DIAGNOSIS — M069 Rheumatoid arthritis, unspecified: Secondary | ICD-10-CM | POA: Diagnosis not present

## 2017-08-27 DIAGNOSIS — Z794 Long term (current) use of insulin: Secondary | ICD-10-CM | POA: Insufficient documentation

## 2017-08-27 DIAGNOSIS — I10 Essential (primary) hypertension: Secondary | ICD-10-CM | POA: Diagnosis not present

## 2017-08-27 DIAGNOSIS — E11622 Type 2 diabetes mellitus with other skin ulcer: Secondary | ICD-10-CM | POA: Insufficient documentation

## 2017-08-31 ENCOUNTER — Other Ambulatory Visit: Payer: BLUE CROSS/BLUE SHIELD | Admitting: Obstetrics and Gynecology

## 2017-08-31 ENCOUNTER — Other Ambulatory Visit: Payer: BLUE CROSS/BLUE SHIELD

## 2017-09-01 ENCOUNTER — Telehealth: Payer: Self-pay | Admitting: Obstetrics and Gynecology

## 2017-09-01 ENCOUNTER — Encounter: Payer: Self-pay | Admitting: Obstetrics and Gynecology

## 2017-09-01 NOTE — Telephone Encounter (Signed)
Patient DNKA her 08/31/17 ultrasound. Will need to reschedule.

## 2017-09-07 ENCOUNTER — Other Ambulatory Visit: Payer: Self-pay | Admitting: Family Medicine

## 2017-09-07 DIAGNOSIS — IMO0002 Reserved for concepts with insufficient information to code with codable children: Secondary | ICD-10-CM

## 2017-09-07 DIAGNOSIS — E1165 Type 2 diabetes mellitus with hyperglycemia: Secondary | ICD-10-CM

## 2017-09-07 DIAGNOSIS — Z794 Long term (current) use of insulin: Principal | ICD-10-CM

## 2017-10-20 ENCOUNTER — Telehealth (INDEPENDENT_AMBULATORY_CARE_PROVIDER_SITE_OTHER): Payer: Self-pay | Admitting: Orthopaedic Surgery

## 2017-10-20 NOTE — Telephone Encounter (Signed)
Patient called stating that she would like another shot in her knee.  CB#[phone].  Thank you.

## 2017-10-20 NOTE — Telephone Encounter (Signed)
Noted  

## 2017-10-23 ENCOUNTER — Other Ambulatory Visit: Payer: Self-pay | Admitting: Family Medicine

## 2017-10-23 DIAGNOSIS — Z794 Long term (current) use of insulin: Principal | ICD-10-CM

## 2017-10-23 DIAGNOSIS — E1165 Type 2 diabetes mellitus with hyperglycemia: Secondary | ICD-10-CM

## 2017-10-23 DIAGNOSIS — IMO0002 Reserved for concepts with insufficient information to code with codable children: Secondary | ICD-10-CM

## 2017-11-03 ENCOUNTER — Telehealth (INDEPENDENT_AMBULATORY_CARE_PROVIDER_SITE_OTHER): Payer: Self-pay

## 2017-11-03 NOTE — Telephone Encounter (Signed)
Called patient and left a VM for patient to call back to discuss what type of injection she would like to have for her knee.

## 2017-12-03 ENCOUNTER — Telehealth: Payer: Self-pay | Admitting: *Deleted

## 2017-12-03 DIAGNOSIS — R198 Other specified symptoms and signs involving the digestive system and abdomen: Secondary | ICD-10-CM

## 2017-12-03 DIAGNOSIS — R6889 Other general symptoms and signs: Principal | ICD-10-CM

## 2017-12-03 NOTE — Telephone Encounter (Signed)
Follow-up call to patient regarding missed pelvic ultrasound.  Unable to leave message. Voice mailbox is full.  Will send My Chart message.

## 2017-12-03 NOTE — Telephone Encounter (Signed)
Patient left a voicemail and said she has been trying for 6 weeks to get an appt for Dr. Magnus Ivan for a gel injection in her knee. She did not say what type of injection she would like to have. # [phone]

## 2017-12-06 ENCOUNTER — Telehealth (INDEPENDENT_AMBULATORY_CARE_PROVIDER_SITE_OTHER): Payer: Self-pay

## 2017-12-06 ENCOUNTER — Telehealth (INDEPENDENT_AMBULATORY_CARE_PROVIDER_SITE_OTHER): Payer: Self-pay | Admitting: Orthopaedic Surgery

## 2017-12-06 NOTE — Telephone Encounter (Signed)
Patient called back stating she never received a vm from you and that it is in her chart as to what injection she had before.  I saw back in September she had a hyaluronic acid injection with Synvisc 1.  She is wanting an injection ASAP.  CB#[phone].

## 2017-12-06 NOTE — Telephone Encounter (Signed)
Talked with patient concerning gel injection.  Will submit for SyniviscOne.

## 2017-12-06 NOTE — Telephone Encounter (Signed)
Follow-up call to patient. Per DPR, can leave message on voice mail.  Left message calling regarding follow-up appointment. Requested call back to clinical supervisor, Kennon Rounds.

## 2017-12-06 NOTE — Telephone Encounter (Signed)
Application for VOB, SynviscOne,bilateral knee has been submitted.

## 2017-12-06 NOTE — Telephone Encounter (Signed)
Submitted for VOB, SynviscOne, bilateral knee. 

## 2017-12-07 ENCOUNTER — Telehealth (INDEPENDENT_AMBULATORY_CARE_PROVIDER_SITE_OTHER): Payer: Self-pay

## 2017-12-07 NOTE — Telephone Encounter (Signed)
PA required for SynviscOne, Bilateral Knee Completed PA form and faxed to [facility] at [phone].

## 2017-12-09 ENCOUNTER — Encounter (INDEPENDENT_AMBULATORY_CARE_PROVIDER_SITE_OTHER): Payer: Self-pay | Admitting: Orthopaedic Surgery

## 2017-12-09 ENCOUNTER — Ambulatory Visit (INDEPENDENT_AMBULATORY_CARE_PROVIDER_SITE_OTHER): Payer: BLUE CROSS/BLUE SHIELD | Admitting: Orthopaedic Surgery

## 2017-12-09 ENCOUNTER — Ambulatory Visit (INDEPENDENT_AMBULATORY_CARE_PROVIDER_SITE_OTHER): Payer: BLUE CROSS/BLUE SHIELD

## 2017-12-09 DIAGNOSIS — M25561 Pain in right knee: Secondary | ICD-10-CM | POA: Diagnosis not present

## 2017-12-09 DIAGNOSIS — M1712 Unilateral primary osteoarthritis, left knee: Secondary | ICD-10-CM | POA: Diagnosis not present

## 2017-12-09 MED ORDER — METHYLPREDNISOLONE ACETATE 40 MG/ML IJ SUSP
40.0000 mg | INTRAMUSCULAR | Status: AC | PRN
  Administered 2017-12-09: 40 mg via INTRA_ARTICULAR

## 2017-12-09 MED ORDER — LIDOCAINE HCL 1 % IJ SOLN
3.0000 mL | INTRAMUSCULAR | Status: AC | PRN
  Administered 2017-12-09: 3 mL

## 2017-12-09 MED ORDER — HYLAN G-F 20 48 MG/6ML IX SOSY
48.0000 mg | PREFILLED_SYRINGE | INTRA_ARTICULAR | Status: AC | PRN
  Administered 2017-12-09: 48 mg via INTRA_ARTICULAR

## 2017-12-09 NOTE — Progress Notes (Signed)
   Procedure Note  Patient: Heather Myers             Date of Birth: 1965-06-17           MRN: [id number]             Visit Date: 12/09/2017  Procedures: Visit Diagnoses: Right knee pain, unspecified chronicity - Plan: XR Knee 1-2 Views Right  Unilateral primary osteoarthritis, left knee  Large Joint Inj: R knee on 12/09/2017 2:08 PM Indications: diagnostic evaluation and pain Details: 22 G 1.5 in needle, superolateral approach  Arthrogram: No  Medications: 3 mL lidocaine 1 %; 40 mg methylPREDNISolone acetate 40 MG/ML Outcome: tolerated well, no immediate complications Procedure, treatment alternatives, risks and benefits explained, specific risks discussed. Consent was given by the patient. Immediately prior to procedure a time out was called to verify the correct patient, procedure, equipment, support staff and site/side marked as required. Patient was prepped and draped in the usual sterile fashion.   Large Joint Inj: L knee on 12/09/2017 2:08 PM Indications: pain and diagnostic evaluation Details: 22 G 1.5 in needle, superolateral approach  Arthrogram: No  Medications: 48 mg Hylan 48 MG/6ML Outcome: tolerated well, no immediate complications Procedure, treatment alternatives, risks and benefits explained, specific risks discussed. Consent was given by the patient. Immediately prior to procedure a time out was called to verify the correct patient, procedure, equipment, support staff and site/side marked as required. Patient was prepped and draped in the usual sterile fashion.    Patient is well-known to me.  She is a 52 year old female with bilateral knee issues.  She is here for scheduled Synvisc 1 injection to treat the pain from osteoarthritis in the left knee.  She would like to have a steroid injection in her right knee because that is flared up recently for the last month to months with pain.  She is someone who is morbidly obese with a BMI of 48.3.  She is diabetic  but reports good control.  She has had steroid injections in the past and this is not definitely affected her blood glucose.  Her right knee has been bothering her more recently but left knee is been more of a chronic issue.  Both knees have significant patellofemoral arthritic changes as well.  On exam neither knee has an effusion today but both knees have patellofemoral crepitation global tenderness.  Per her plans previous in request we did place a Synvisc 1 injection in her left knee after anesthetizing it with lidocaine.  Also place a steroid injection in her right knee.  She understands that weight loss will be the best thing for her in terms of taking pressure off of her knees.  All question concerns were answered and addressed.  Follow-up will be as needed.

## 2017-12-13 ENCOUNTER — Encounter: Payer: Self-pay | Admitting: Family Medicine

## 2017-12-13 ENCOUNTER — Ambulatory Visit: Payer: BLUE CROSS/BLUE SHIELD | Admitting: Family Medicine

## 2017-12-13 VITALS — BP 128/82 | HR 86 | Temp 97.4°F | Wt 290.0 lb

## 2017-12-13 DIAGNOSIS — L0291 Cutaneous abscess, unspecified: Secondary | ICD-10-CM | POA: Diagnosis not present

## 2017-12-13 MED ORDER — SULFAMETHOXAZOLE-TRIMETHOPRIM 800-160 MG PO TABS: 1.0000 | ORAL_TABLET | Freq: Two times a day (BID) | ORAL | 0 refills | Status: DC

## 2017-12-13 NOTE — Progress Notes (Signed)
Subjective:    Patient ID: THRESEA DOBLE, female    DOB: Apr 10, 1966, 52 y.o.   MRN: [id number]  No chief complaint on file.   HPI Patient was seen today for acute concern.  Pt endorses pimple-like bump on right ear x1 week causing pain and chills.  Pt also notes the right side of her face near her ear hurts.  Pt has a history of diabetes type 2 on insulin.  Past Medical History:  Diagnosis Date  . Allergic rhinitis   . Anemia   . Anxiety   . Arthritis   . Asthma, mild intermittent 12/19/2015  . Complication of anesthesia    difficulty waking up from anesthesia  . Depression   . Diabetes mellitus type 2 in obese ([facility])   . Hyperlipidemia   . Hypertension   . Low blood pressure reading    noticed lately  . Morbid obesity ([facility])   . Pneumonia    history of  . Sleep apnea    mild sleep, no CPAP needed at this time    Allergies  Allergen Reactions  . Ramipril Shortness Of Breath    ROS General: Denies fever, chills, night sweats, changes in weight, changes in appetite HEENT: Denies headaches, ear pain, changes in vision, rhinorrhea, sore throat   +pain in R ear/face CV: Denies CP, palpitations, SOB, orthopnea Pulm: Denies SOB, cough, wheezing GI: Denies abdominal pain, nausea, vomiting, diarrhea, constipation GU: Denies dysuria, hematuria, frequency, vaginal discharge Msk: Denies muscle cramps, joint pains Neuro: Denies weakness, numbness, tingling Skin: Denies rashes, bruising   +abscess on R ear. Psych: Denies depression, anxiety, hallucinations     Objective:    Blood pressure 128/82, pulse 86, temperature (!) 97.4 F (36.3 C), temperature source Oral, weight 290 lb (131.5 kg), SpO2 97 %.   Gen. Pleasant, well-nourished, in no distress, normal affect   HEENT: [place]/AT, face symmetric, EOMI, nares patent without drainage.  No TTP of parotid glands bilaterally.  No pain with movement of external ear.  TMs normal bilaterally. Lungs: no accessory muscle  use Cardiovascular: RRR, no m/r/g, no peripheral edema Neuro:  A&Ox3, CN II-XII intact, normal gait Skin:  Warm, no lesions/ rash.  5 mm pustule noted superior to right tragus and anterior to right leg of helix. TTP in area surrounding the pustule with mild erythema.   Wt Readings from Last 3 Encounters:  12/13/17 290 lb (131.5 kg)  08/11/17 290 lb (131.5 kg)  06/15/17 291 lb (132 kg)    Lab Results  Component Value Date   WBC 10.5 06/04/2017   HGB 12.6 06/04/2017   HCT 38.6 06/04/2017   PLT 364 06/04/2017   GLUCOSE 171 (H) 06/15/2017   CHOL 192 06/15/2017   TRIG 129.0 06/15/2017   HDL 33.90 (L) 06/15/2017   LDLCALC 132 (H) 06/15/2017   ALT 10 09/28/2016   AST 14 09/28/2016   NA 136 06/15/2017   K 3.9 06/15/2017   CL 102 06/15/2017   CREATININE 0.59 06/15/2017   BUN 12 06/15/2017   CO2 26 06/15/2017   TSH 1.51 09/09/2016   HGBA1C 8.4 (H) 06/15/2017   MICROALBUR 1.8 06/15/2017    Assessment/Plan:  Abscess  -Consent obtained for I&D, see procedure note below.  Pt tolerated procedure well. -Discussed okay to continue warm compresses. -Given handouts -Given Rx for Bactrim twice daily x10 days. -Patient encouraged to monitor blood sugar, stay hydrated eating yogurt/taking probiotics while on antibiotics. -Given RTC or ED precautions including increased or worsening pain,  erythema, fever, chills, etc. -Consider referral to ENT if symptoms continue - Plan: sulfamethoxazole-trimethoprim (BACTRIM DS,SEPTRA DS) 800-160 MG tablet  Follow-up with PCP as needed   Incision and Drainage Procedure Note  Pre-operative Diagnosis: Abscess  Post-operative Diagnosis: same  Indications: Abscess  Anesthesia: 1% plain lidocaine  Procedure Details  The procedure, risks and complications have been discussed in detail (including, but not limited to airway compromise, infection, bleeding) with the patient, and the patient has signed consent to the procedure.  The skin was  sterilely prepped and draped over the affected area in the usual fashion. After adequate local anesthesia, I&D with a #11 blade was performed on the right ear, superior to tragus. Purulent drainage: present The patient was observed until stable.  Condition: Tolerated procedure well   Complications: none  Abbe Amsterdam, MD

## 2017-12-13 NOTE — Patient Instructions (Addendum)
Skin Abscess A skin abscess is an infected area on or under your skin that contains a collection of pus and other material. An abscess may also be called a furuncle, carbuncle, or boil. An abscess can occur in or on almost any part of your body. Some abscesses break open (rupture) on their own. Most continue to get worse unless they are treated. The infection can spread deeper into the body and eventually into your blood, which can make you feel ill. Treatment usually involves draining the abscess. What are the causes? An abscess occurs when germs, often bacteria, pass through your skin and cause an infection. This may be caused by:  A scrape or cut on your skin.  A puncture wound through your skin, including a needle injection.  Blocked oil or sweat glands.  Blocked and infected hair follicles.  A cyst that forms beneath your skin (sebaceous cyst) and becomes infected.  What increases the risk? This condition is more likely to develop in people who:  Have a weak body defense system (immune system).  Have diabetes.  Have dry and irritated skin.  Get frequent injections or use illegal IV drugs.  Have a foreign body in a wound, such as a splinter.  Have problems with their lymph system or veins.  What are the signs or symptoms? An abscess may start as a painful, firm bump under the skin. Over time, the abscess may get larger or become softer. Pus may appear at the top of the abscess, causing pressure and pain. It may eventually break through the skin and drain. Other symptoms include:  Redness.  Warmth.  Swelling.  Tenderness.  A sore on the skin.  How is this diagnosed? This condition is diagnosed based on your medical history and a physical exam. A sample of pus may be taken from the abscess to find out what is causing the infection and what antibiotics can be used to treat it. You also may have:  Blood tests to look for signs of infection or spread of an infection to  your blood.  Imaging studies such as ultrasound, CT scan, or MRI if the abscess is deep.  How is this treated? Small abscesses that drain on their own may not need treatment. Treatment for an abscess that does not rupture on its own may include:  Warm compresses applied to the area several times per day.  Incision and drainage. Your health care provider will make an incision to open the abscess and will remove pus and any foreign body or dead tissue. The incision area may be packed with gauze to keep it open for a few days while it heals.  Antibiotic medicines to treat infection. For a severe abscess, you may first get antibiotics through an IV and then change to oral antibiotics.  Follow these instructions at home: Abscess Care  If you have an abscess that has not drained, place a warm, clean, wet washcloth over the abscess several times a day. Do this as told by your health care provider.  Follow instructions from your health care provider about how to take care of your abscess. Make sure you: ? Cover the abscess with a bandage (dressing). ? Change your dressing or gauze as told by your health care provider. ? Wash your hands with soap and water before you change the dressing or gauze. If soap and water are not available, use hand sanitizer.  Check your abscess every day for signs of a worsening infection. Check for: ?   More redness, swelling, or pain. ? More fluid or blood. ? Warmth. ? More pus or a bad smell. Medicines  Take over-the-counter and prescription medicines only as told by your health care provider.  If you were prescribed an antibiotic medicine, take it as told by your health care provider. Do not stop taking the antibiotic even if you start to feel better. General instructions  To avoid spreading the infection: ? Do not share personal care items, towels, or hot tubs with others. ? Avoid making skin contact with other people.  Keep all follow-up visits as told by  your health care provider. This is important. Contact a health care provider if:  You have more redness, swelling, or pain around your abscess.  You have more fluid or blood coming from your abscess.  Your abscess feels warm to the touch.  You have more pus or a bad smell coming from your abscess.  You have a fever.  You have muscle aches.  You have chills or a general ill feeling. Get help right away if:  You have severe pain.  You see red streaks on your skin spreading away from the abscess. This information is not intended to replace advice given to you by your health care provider. Make sure you discuss any questions you have with your health care provider. Document Released: 01/21/2005 Document Revised: 12/08/2015 Document Reviewed: 02/20/2015 Elsevier Interactive Patient Education  2018 [employer]  Incision and Drainage, Care After Refer to this sheet in the next few weeks. These instructions provide you with information about caring for yourself after your procedure. Your health care provider may also give you more specific instructions. Your treatment has been planned according to current medical practices, but problems sometimes occur. Call your health care provider if you have any problems or questions after your procedure. What can I expect after the procedure? After the procedure, it is common to have:  Pain or discomfort around your incision site.  Drainage from your incision.  Follow these instructions at home:  Take over-the-counter and prescription medicines only as told by your health care provider.  If you were prescribed an antibiotic medicine, take it as told by your health care provider.Do not stop taking the antibiotic even if you start to feel better.  Followinstructions from your health care provider about: ? How to take care of your incision. ? When and how you should change your packing and bandage (dressing). Wash your hands with soap and  water before you change your dressing. If soap and water are not available, use hand sanitizer. ? When you should remove your dressing.  Do not take baths, swim, or use a hot tub until your health care provider approves.  Keep all follow-up visits as told by your health care provider. This is important.  Check your incision area every day for signs of infection. Check for: ? More redness, swelling, or pain. ? More fluid or blood. ? Warmth. ? Pus or a bad smell. Contact a health care provider if:  Your cyst or abscess returns.  You have a fever.  You have more redness, swelling, or pain around your incision.  You have more fluid or blood coming from your incision.  Your incision feels warm to the touch.  You have pus or a bad smell coming from your incision. Get help right away if:  You have severe pain or bleeding.  You cannot eat or drink without vomiting.  You have decreased urine output.  You  become short of breath.  You have chest pain.  You cough up blood.  The area where the incision and drainage occurred becomes numb or it tingles. This information is not intended to replace advice given to you by your health care provider. Make sure you discuss any questions you have with your health care provider. Document Released: 07/06/2011 Document Revised: 09/13/2015 Document Reviewed: 02/01/2015 Elsevier Interactive Patient Education  [employer].

## 2017-12-14 NOTE — Telephone Encounter (Signed)
Left message to call Kaitlyn at [phone]. 

## 2017-12-16 NOTE — Telephone Encounter (Signed)
Follow-up call to patient.  Advised calling regarding missed pelvic ultrasound.  Patient denies any further pain. Had had light menses in August but had no menses for prior three months. Advised recommend reschedule pelvic ultrasound and follow-up with Dr Oscar La. Patient agreeable to appointment scheduled for 12-21-17. Advised of cancel policy.   Routing to provider for  review. Will close encounter.

## 2017-12-21 ENCOUNTER — Ambulatory Visit (INDEPENDENT_AMBULATORY_CARE_PROVIDER_SITE_OTHER): Payer: BLUE CROSS/BLUE SHIELD

## 2017-12-21 ENCOUNTER — Encounter: Payer: Self-pay | Admitting: Obstetrics and Gynecology

## 2017-12-21 ENCOUNTER — Ambulatory Visit (INDEPENDENT_AMBULATORY_CARE_PROVIDER_SITE_OTHER): Payer: BLUE CROSS/BLUE SHIELD | Admitting: Obstetrics and Gynecology

## 2017-12-21 VITALS — BP 112/82 | Wt 284.0 lb

## 2017-12-21 DIAGNOSIS — R188 Other ascites: Secondary | ICD-10-CM | POA: Diagnosis not present

## 2017-12-21 DIAGNOSIS — R6889 Other general symptoms and signs: Secondary | ICD-10-CM | POA: Diagnosis not present

## 2017-12-21 DIAGNOSIS — R198 Other specified symptoms and signs involving the digestive system and abdomen: Secondary | ICD-10-CM

## 2017-12-21 NOTE — Progress Notes (Signed)
GYNECOLOGY  VISIT   HPI: 52 y.o.   Legally Separated  African American  female   [phone] with No LMP recorded.   here for follow up of a right sided pelvic fluid collection. She was seen earlier this year with pelvic pain and an ultrasound revealed her IUD was in place, 2.9 cm simple left ovarian cyst and a surgically absent right ovary with a 4.6 x 3.6 x 3.3 cm fluid collection in the right adnexa with the possibility of an ovarian remnant, possibility of a cystic ovarian remnant neoplasm. A f/u ultrasound vs MRI was recommended.  She has a h/o a hysteroscopy, D&C, and mirena IUD insertion last year for menorrhagia leading to anemia. She is not having cycles with the mirena and her prior pain has resolved.   GYNECOLOGIC HISTORY: No LMP recorded. Contraception:IUD Menopausal hormone therapy: none        OB History    Gravida  3   Para  2   Term      Preterm      AB  1   Living  2     SAB  1   TAB      Ectopic      Multiple      Live Births  2              Patient Active Problem List   Diagnosis Date Noted  . Eczema 08/11/2017  . Unilateral primary osteoarthritis, left knee 11/05/2016  . Insulin dependent type 2 diabetes mellitus, uncontrolled ([place]) 12/19/2015  . Asthma, mild intermittent 12/19/2015  . Bilateral lower extremity edema 12/19/2015  . Rash and nonspecific skin eruption 10/19/2013  . DJD (degenerative joint disease) of knee 06/29/2013  . Dysmenorrhea 03/30/2013  . Dyspnea 06/11/2010  . Morbid obesity ([place]) 12/21/2008  . Palpitations 12/21/2008  . GENITAL HERPES 03/22/2007  . Hyperlipidemia associated with type 2 diabetes mellitus ([place]) 03/22/2007  . Iron deficiency anemia 03/22/2007  . Anxiety disorder 03/22/2007  . DEPRESSION 03/22/2007  . Essential hypertension 03/22/2007  . ALLERGIC RHINITIS 03/22/2007  . [place] DISEASE, CERVICAL 03/22/2007  . POSITIVE PPD 03/22/2007    Past Medical History:  Diagnosis Date  . Allergic rhinitis   .  Anemia   . Anxiety   . Arthritis   . Asthma, mild intermittent 12/19/2015  . Complication of anesthesia    difficulty waking up from anesthesia  . Depression   . Diabetes mellitus type 2 in obese ([place])   . Hyperlipidemia   . Hypertension   . Low blood pressure reading    noticed lately  . Morbid obesity ([place])   . Pneumonia    history of  . Sleep apnea    mild sleep, no CPAP needed at this time    Past Surgical History:  Procedure Laterality Date  . ABDOMINAL SURGERY     chronic abd wall abscess  . CESAREAN SECTION     x's 2  . CHOLECYSTECTOMY    . DILATATION & CURETTAGE/HYSTEROSCOPY WITH MYOSURE N/A 12/08/2016   Procedure: DILATATION & CURETTAGE/HYSTEROSCOPY WITH MYOSURE;  Surgeon: Salvadore Dom, MD;  Location: [facility];  Service: Gynecology;  Laterality: N/A;  . INTRAUTERINE DEVICE (IUD) INSERTION N/A 12/08/2016   Procedure: MIRENA INTRAUTERINE DEVICE (IUD) INSERTION (IUD FROM OFFICE);  Surgeon: Salvadore Dom, MD;  Location: [facility];  Service: Gynecology;  Laterality: N/A;  . KNEE ARTHROSCOPY Right   . LIPOMA EXCISION  08/03/2011   Procedure: EXCISION LIPOMA;  Surgeon: Harl Bowie,  MD;  Location: [facility];  Service: General;  Laterality: Right;  wide excision chronic abscess Right lower abdominal wall  . OOPHORECTOMY     R ovary removed at [facility]      Current Outpatient Medications  Medication Sig Dispense Refill  . albuterol (PROVENTIL HFA;VENTOLIN HFA) 108 (90 Base) MCG/ACT inhaler Inhale 2 puffs into the lungs every 6 (six) hours as needed for wheezing or shortness of breath. 8.5 g 2  . atorvastatin (LIPITOR) 40 MG tablet Take 1 tablet (40 mg total) by mouth daily. 90 tablet 2  . BAYER MICROLET LANCETS lancets Use as instructed to check sugar up to 6 times daily 600 each 5  . betamethasone valerate ointment (VALISONE) 0.1 % Apply 1 application topically daily as needed. ECZEMA  0  . Blood Glucose Monitoring Suppl (BAYER CONTOUR  MONITOR) w/Device KIT Use to check sugar daily 1 each 0  . citalopram (CELEXA) 40 MG tablet Take 1 tablet (40 mg total) by mouth daily. 90 tablet 2  . clobetasol cream (TEMOVATE) 1.63 % Apply 1 application topically 2 (two) times daily. 45 g 1  . glucose blood (BAYER CONTOUR TEST) test strip Use as instructed to check sugar 6 times daily 600 each 5  . hydroquinone 4 % cream Apply 1 application topically 2 (two) times daily as needed (skin discoloration).    Marland Kitchen ibuprofen (ADVIL,MOTRIN) 600 MG tablet Take 600 mg by mouth every 6 (six) hours as needed for moderate pain.    . Insulin Glargine (TOUJEO SOLOSTAR) 300 UNIT/ML SOPN Inject 75 Units into the skin daily. 21 pen 2  . Insulin Pen Needle 31G X 5 MM MISC Use 4x a day 400 each 3  . INVOKANA 300 MG TABS tablet TAKE 1 TABLET BY MOUTH EVERY DAY 90 tablet 3  . metFORMIN (GLUCOPHAGE-XR) 500 MG 24 hr tablet TAKE 2 TABLETS BY MOUTH TWICE A DAY 360 tablet 0  . metoprolol succinate (TOPROL-XL) 25 MG 24 hr tablet TAKE ONE TABLET BY MOUTH DAILY.( TAKE WITH 50 MG TO TOTAL 75 MG DAILY.) 90 tablet 2  . metoprolol succinate (TOPROL-XL) 50 MG 24 hr tablet TAKE ONE TABLET BY MOUTH DAILY (TAKE WITH 25 MG TABLET TO TOTAL 75 MG DAILY) 90 tablet 2  . Multiple Vitamins-Minerals (MULTIVITAMIN WITH MINERALS) tablet Take 1 tablet by mouth daily.    Marland Kitchen NOVOLOG FLEXPEN 100 UNIT/ML FlexPen INJECT 25 UNITS 3 TIMES A DAY *15 MINUTES BEFORE MEALS* 1 pen 2  . phenazopyridine (PYRIDIUM) 200 MG tablet Take 1 tablet (200 mg total) by mouth 3 (three) times daily as needed. 6 tablet 0  . silver sulfADIAZINE (SILVADENE) 1 % cream Apply 1 application topically daily. 50 g 0  . furosemide (LASIX) 20 MG tablet Take 1 tablet (20 mg total) by mouth daily. 90 tablet 3  . spironolactone (ALDACTONE) 25 MG tablet Take 2 tablets (50 mg total) by mouth daily. 180 tablet 3   No current facility-administered medications for this visit.      ALLERGIES: Ramipril  Family History  Problem Relation  Age of Onset  . Diabetes Mother   . Hypertension Mother   . Other Mother        renal failure  . Diabetes Other   . Hypertension Father   . Cancer Father     Social History   Socioeconomic History  . Marital status: Legally Separated    Spouse name: Not on file  . Number of children: Not on file  .  Years of education: Not on file  . Highest education level: Not on file  Occupational History  . Not on file  Social Needs  . Financial resource strain: Not on file  . Food insecurity:    Worry: Not on file    Inability: Not on file  . Transportation needs:    Medical: Not on file    Non-medical: Not on file  Tobacco Use  . Smoking status: Never Smoker  . Smokeless tobacco: Never Used  . Tobacco comment: Married  Substance and Sexual Activity  . Alcohol use: Yes    Comment: rare  . Drug use: No  . Sexual activity: Not Currently    Partners: Male    Birth control/protection: IUD  Lifestyle  . Physical activity:    Days per week: Not on file    Minutes per session: Not on file  . Stress: Not on file  Relationships  . Social connections:    Talks on phone: Not on file    Gets together: Not on file    Attends religious service: Not on file    Active member of club or organization: Not on file    Attends meetings of clubs or organizations: Not on file    Relationship status: Not on file  . Intimate partner violence:    Fear of current or ex partner: Not on file    Emotionally abused: Not on file    Physically abused: Not on file    Forced sexual activity: Not on file  Other Topics Concern  . Not on file  Social History Narrative  . Not on file    Review of Systems  Constitutional: Negative.   HENT: Negative.   Eyes: Negative.   Respiratory: Negative.   Cardiovascular: Negative.   Gastrointestinal: Negative.   Genitourinary: Negative.   Musculoskeletal: Negative.   Skin: Negative.   Neurological: Negative.   Endo/Heme/Allergies: Negative.    Psychiatric/Behavioral: Negative.   All other systems reviewed and are negative.   PHYSICAL EXAMINATION:    BP 112/82   Wt 284 lb (128.8 kg)   BMI 47.26 kg/m     General appearance: alert, cooperative and appears stated age  Ultrasound images reviewed with the patient.   ASSESSMENT Right adnexal collection in 2/19, ?ovarian remnant, seroma or neoplasm. On ultrasound today there is a smaller cyst like collection in the right adnexal region (smaller), no concerning features. Suspect a peritoneal cyst.  Pelvic pain, resolved.    PLAN She will schedule an annual exam Call with any concerns.   An After Visit Summary was printed and given to the patient.

## 2017-12-22 ENCOUNTER — Encounter: Payer: Self-pay | Admitting: Obstetrics and Gynecology

## 2018-01-12 ENCOUNTER — Other Ambulatory Visit: Payer: Self-pay | Admitting: Family Medicine

## 2018-01-12 DIAGNOSIS — F418 Other specified anxiety disorders: Secondary | ICD-10-CM

## 2018-01-25 IMAGING — CR DG ABDOMEN 1V
2 series · 2 of 2 positions shown · non-contrast
Comparison: 05/08/2013.

CLINICAL DATA: 50-year-old with an intrauterine device. The
localization thread is no longer visible neck clinical examination.

EXAM:
ABDOMEN - 1 VIEW

[t abdomen supine (1 of 2)]
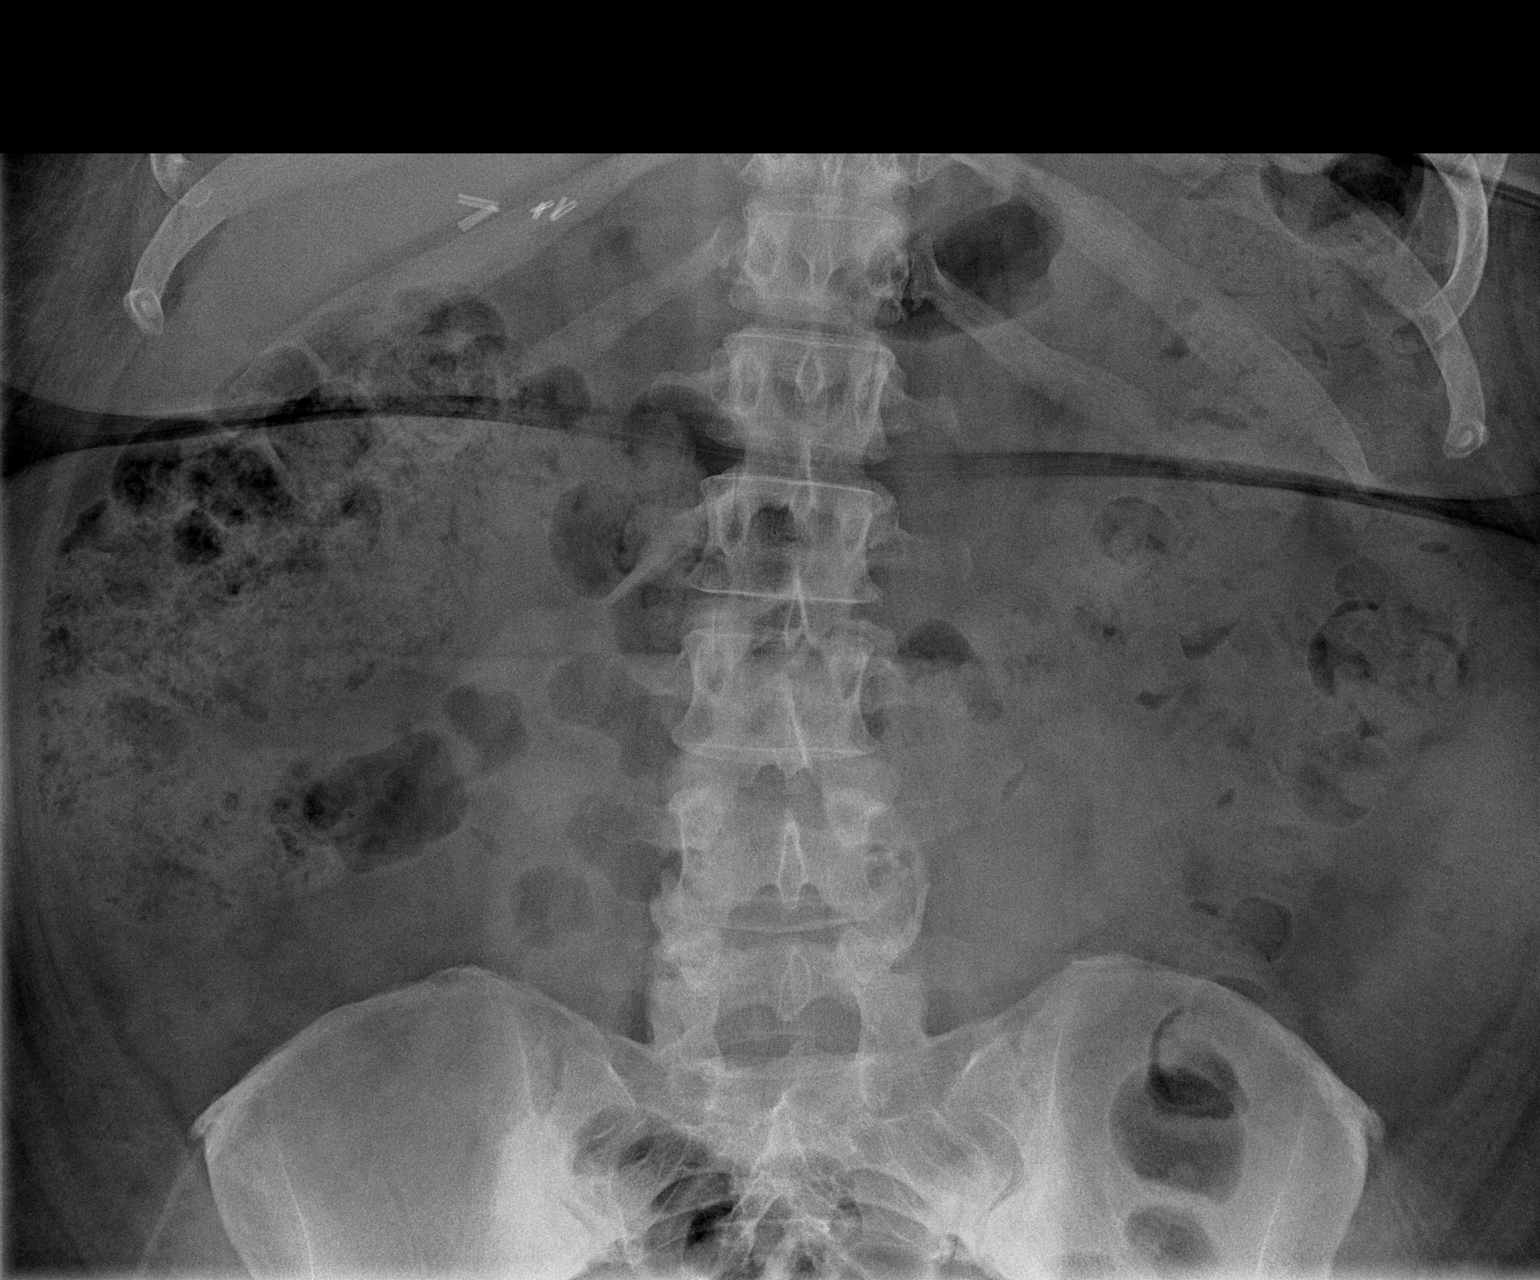

[t abdomen supine (2 of 2)]
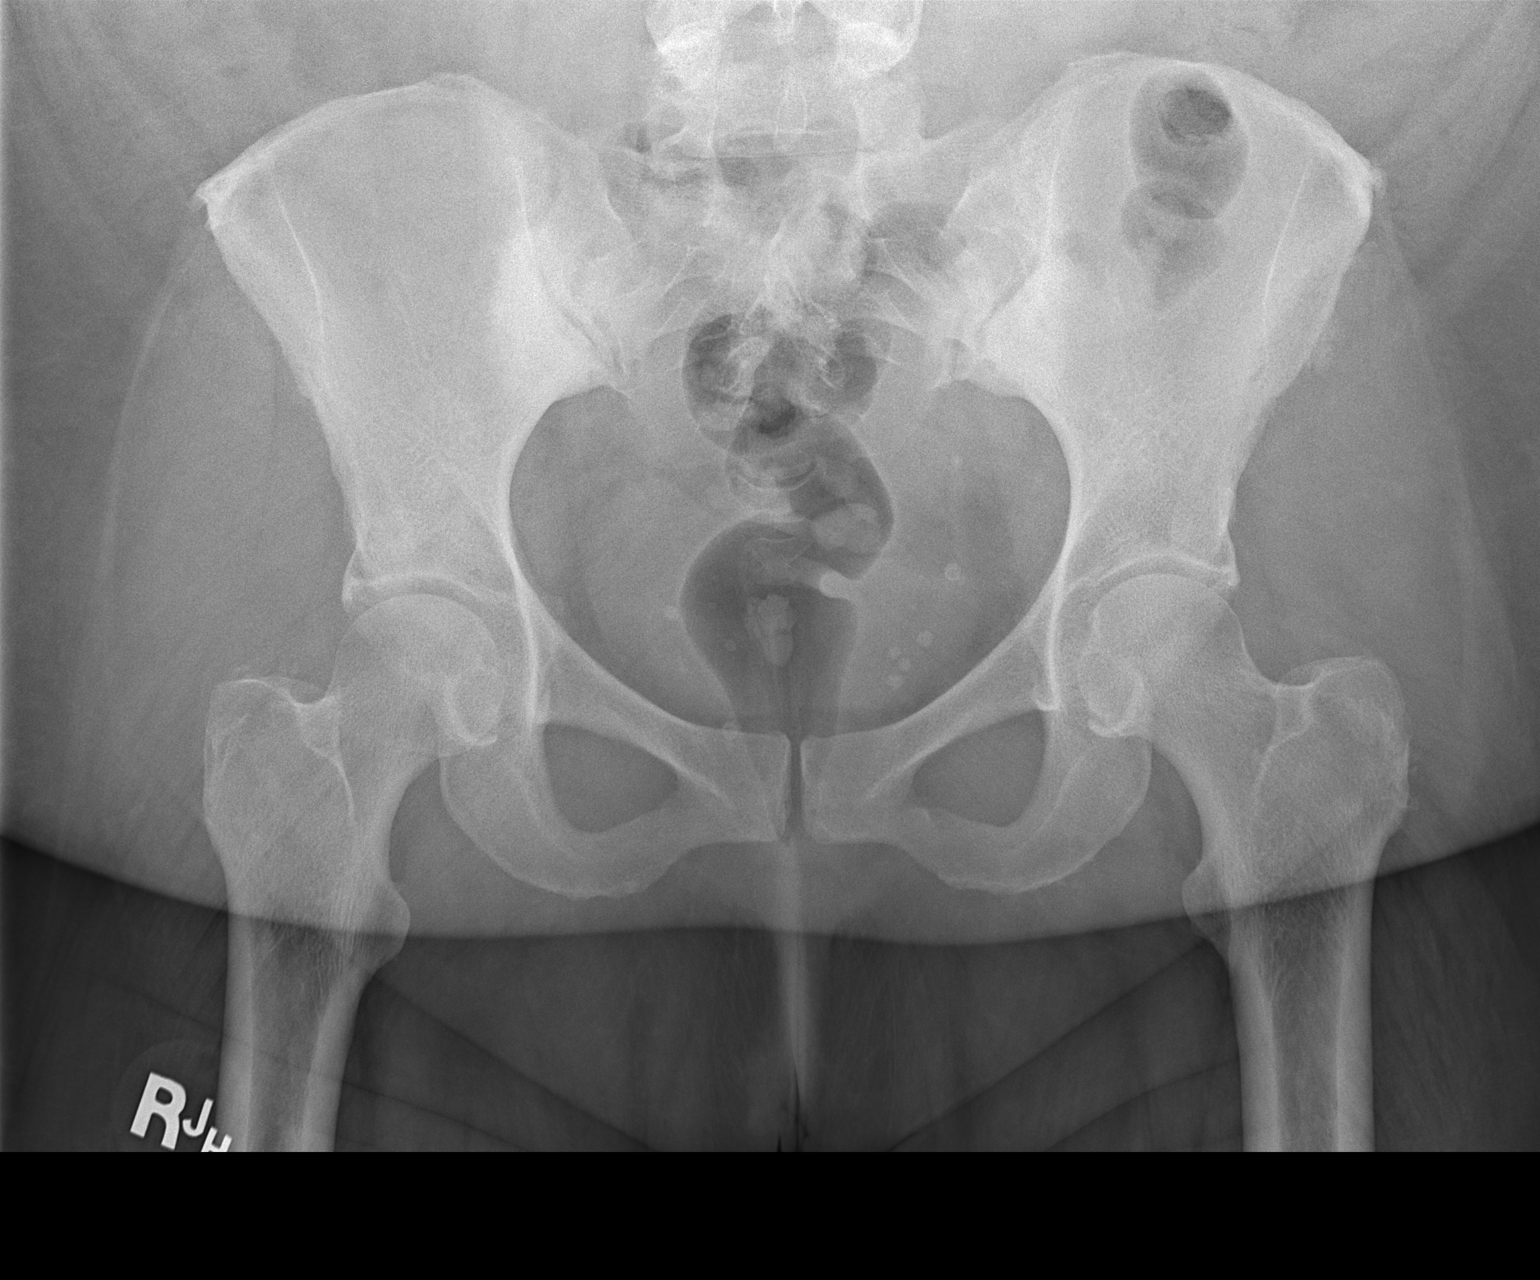

[2 of 2 positions shown; findings below may reference images not displayed]

FINDINGS: The previously identified T-shaped intrauterine device is not
present currently.

Bowel gas pattern unremarkable without evidence of obstruction or
significant ileus. Moderate stool burden in the colon. Surgical
clips in the right upper quadrant from prior cholecystectomy.
Degenerative changes involving the sacroiliac joints and/or osteitis
condensans ilii as noted previously.
IMPRESSION: 1. The previously identified intrauterine device is no longer
present.
2. No acute abdominal abnormality.  Moderate colonic stool burden.

## 2018-02-02 ENCOUNTER — Other Ambulatory Visit: Payer: Self-pay | Admitting: Family Medicine

## 2018-02-02 DIAGNOSIS — E1165 Type 2 diabetes mellitus with hyperglycemia: Secondary | ICD-10-CM

## 2018-02-02 DIAGNOSIS — IMO0002 Reserved for concepts with insufficient information to code with codable children: Secondary | ICD-10-CM

## 2018-02-02 DIAGNOSIS — Z794 Long term (current) use of insulin: Principal | ICD-10-CM

## 2018-02-14 ENCOUNTER — Other Ambulatory Visit: Payer: Self-pay | Admitting: Interventional Cardiology

## 2018-02-14 ENCOUNTER — Other Ambulatory Visit: Payer: Self-pay | Admitting: Family Medicine

## 2018-02-14 DIAGNOSIS — Z794 Long term (current) use of insulin: Principal | ICD-10-CM

## 2018-02-14 DIAGNOSIS — IMO0002 Reserved for concepts with insufficient information to code with codable children: Secondary | ICD-10-CM

## 2018-02-14 DIAGNOSIS — E1165 Type 2 diabetes mellitus with hyperglycemia: Secondary | ICD-10-CM

## 2018-02-14 MED ORDER — METOPROLOL SUCCINATE ER 50 MG PO TB24: ORAL_TABLET | ORAL | 0 refills | Status: DC

## 2018-02-18 ENCOUNTER — Other Ambulatory Visit: Payer: Self-pay | Admitting: Interventional Cardiology

## 2018-03-02 ENCOUNTER — Telehealth: Payer: Self-pay | Admitting: Family Medicine

## 2018-03-02 NOTE — Telephone Encounter (Signed)
It is okay to place referral to endocrinologist as she requested to continue DM2 management. Thanks, BJ

## 2018-03-02 NOTE — Telephone Encounter (Signed)
Copied from CRM [id number]. Topic: General - Other >> Mar 02, 2018  3:48 PM Tamela Oddi wrote: Reason for CRM: Patient called to request that the doctor send in a referral for an endocrinologist (Dr. Allena Katz # [phone]).  Patient stated that a previous endocrinologist that she was referred to, did not work out.  Please advise and send referral in as soon as possible.

## 2018-03-02 NOTE — Telephone Encounter (Signed)
Message sent to Dr. Jordan for review and approval. 

## 2018-03-03 ENCOUNTER — Telehealth: Payer: Self-pay | Admitting: *Deleted

## 2018-03-03 ENCOUNTER — Other Ambulatory Visit: Payer: Self-pay | Admitting: *Deleted

## 2018-03-03 ENCOUNTER — Ambulatory Visit: Payer: BLUE CROSS/BLUE SHIELD | Admitting: Physician Assistant

## 2018-03-03 ENCOUNTER — Other Ambulatory Visit: Payer: Self-pay | Admitting: Family Medicine

## 2018-03-03 ENCOUNTER — Encounter: Payer: Self-pay | Admitting: Physician Assistant

## 2018-03-03 VITALS — BP 128/60 | HR 84 | Ht 65.0 in | Wt 286.1 lb

## 2018-03-03 DIAGNOSIS — R0609 Other forms of dyspnea: Secondary | ICD-10-CM | POA: Diagnosis not present

## 2018-03-03 DIAGNOSIS — I10 Essential (primary) hypertension: Secondary | ICD-10-CM

## 2018-03-03 DIAGNOSIS — R0600 Dyspnea, unspecified: Secondary | ICD-10-CM

## 2018-03-03 DIAGNOSIS — Z794 Long term (current) use of insulin: Principal | ICD-10-CM

## 2018-03-03 DIAGNOSIS — IMO0002 Reserved for concepts with insufficient information to code with codable children: Secondary | ICD-10-CM

## 2018-03-03 DIAGNOSIS — E785 Hyperlipidemia, unspecified: Secondary | ICD-10-CM

## 2018-03-03 DIAGNOSIS — R002 Palpitations: Secondary | ICD-10-CM | POA: Diagnosis not present

## 2018-03-03 DIAGNOSIS — R609 Edema, unspecified: Secondary | ICD-10-CM

## 2018-03-03 DIAGNOSIS — E1165 Type 2 diabetes mellitus with hyperglycemia: Secondary | ICD-10-CM

## 2018-03-03 DIAGNOSIS — R5383 Other fatigue: Secondary | ICD-10-CM | POA: Diagnosis not present

## 2018-03-03 MED ORDER — SPIRONOLACTONE 25 MG PO TABS: 50.0000 mg | ORAL_TABLET | Freq: Every day | ORAL | 0 refills | Status: DC

## 2018-03-03 MED ORDER — METOPROLOL SUCCINATE ER 25 MG PO TB24: ORAL_TABLET | ORAL | 0 refills | Status: DC

## 2018-03-03 MED ORDER — METOPROLOL SUCCINATE ER 50 MG PO TB24: ORAL_TABLET | ORAL | 0 refills | Status: DC

## 2018-03-03 MED ORDER — FUROSEMIDE 20 MG PO TABS: 20.0000 mg | ORAL_TABLET | Freq: Every day | ORAL | 0 refills | Status: DC

## 2018-03-03 NOTE — Telephone Encounter (Signed)
  Gaynelle Cage, CMA  Reesa Chew, CMA  Cc: Elliot Cousin, RMA        Leslye Peer denied in lab sleep study. Approved HST. Please order and schedule HST. Auth # [id number] Valid 03/03/18 to 05/01/18.

## 2018-03-03 NOTE — Patient Instructions (Signed)
Medication Instructions:  Your physician recommends that you continue on your current medications as directed. Please refer to the Current Medication list given to you today. ASK YOUR PRIMARY CARE PHYSICIAN ABOUT STARTING A BABY ASPIRIN  If you need a refill on your cardiac medications before your next appointment, please call your pharmacy.   Lab work: TODAY:  CMET, CBC, TSH, PRO BNP, & LIPIDS  If you have labs (blood work) drawn today and your tests are completely normal, you will receive your results only by: Marland Kitchen MyChart Message (if you have MyChart) OR . A paper copy in the mail If you have any lab test that is abnormal or we need to change your treatment, we will call you to review the results.  Testing/Procedures: Your physician has requested that you have an echocardiogram. Echocardiography is a painless test that uses sound waves to create images of your heart. It provides your doctor with information about the size and shape of your heart and how well your heart's chambers and valves are working. This procedure takes approximately one hour. There are no restrictions for this procedure.  Your physician has recommended that you have a sleep study. This test records several body functions during sleep, including: brain activity, eye movement, oxygen and carbon dioxide blood levels, heart rate and rhythm, breathing rate and rhythm, the flow of air through your mouth and nose, snoring, body muscle movements, and chest and belly movement.   You have been referred to [facility]   Follow-Up: KEEP YOUR SCHEDULED APPT IN 04/2018 WITH DR. Katrinka Blazing  Any Other Special Instructions Will Be Listed Below (If Applicable). Echocardiogram An echocardiogram, or echocardiography, uses sound waves (ultrasound) to produce an image of your heart. The echocardiogram is simple, painless, obtained within a short period of time, and offers valuable information to your health care  provider. The images from an echocardiogram can provide information such as:  Evidence of coronary artery disease (CAD).  Heart size.  Heart muscle function.  Heart valve function.  Aneurysm detection.  Evidence of a past heart attack.  Fluid buildup around the heart.  Heart muscle thickening.  Assess heart valve function.  Tell a health care provider about:  Any allergies you have.  All medicines you are taking, including vitamins, herbs, eye drops, creams, and over-the-counter medicines.  Any problems you or family members have had with anesthetic medicines.  Any blood disorders you have.  Any surgeries you have had.  Any medical conditions you have.  Whether you are pregnant or may be pregnant. What happens before the procedure? No special preparation is needed. Eat and drink normally. What happens during the procedure?  In order to produce an image of your heart, gel will be applied to your chest and a wand-like tool (transducer) will be moved over your chest. The gel will help transmit the sound waves from the transducer. The sound waves will harmlessly bounce off your heart to allow the heart images to be captured in real-time motion. These images will then be recorded.  You may need an IV to receive a medicine that improves the quality of the pictures. What happens after the procedure? You may return to your normal schedule including diet, activities, and medicines, unless your health care provider tells you otherwise. This information is not intended to replace advice given to you by your health care provider. Make sure you discuss any questions you have with your health care provider. Document Released: 04/10/2000 Document Revised: 11/30/2015  Document Reviewed: 12/19/2012 Elsevier Interactive Patient Education  2017 [employer].  [place] Studies A sleep study (polysomnogram) is a series of tests done while you are sleeping. It can show how well you sleep.  This can help your health care provider diagnose a sleep disorder and show how severe your sleep disorder is. A sleep study may lead to treatment that will help you sleep better and prevent other medical problems caused by poor sleep. If you have a sleep disorder, you may also be at risk for:  Sleep-related accidents.  High blood pressure.  Heart disease.  Stroke.  Other medical conditions.  Sleep disorders are common. Your health care provider may suspect a sleep disorder if you:  Have loud snoring most nights.  Have brief periods when you stop breathing at night.  Feel sleepy on most days.  Fall asleep suddenly during the day.  Have trouble falling asleep or staying asleep.  Feel like you need to move your legs when trying to fall asleep.  Have dreams that seem very real shortly after falling asleep.  Feel like you cannot move when you first wake up.  Which tests will I need to have? Most sleep studies last all night and include these tests:  Recordings of your brain activity.  Recordings of your eye movements.  Recording of your heart rate and rhythm.  Blood pressure readings.  Readings of the amount of oxygen in your blood.  Measurements of your chest and belly movement as you breathe during sleep.  If you have signs of the sleep disorder called sleep apnea during your test, you may get a mask to wear for the second half of the night.  The mask provides continuous positive airway pressure (CPAP). This may improve sleep apnea significantly.  You will then have all tests done again with the mask in place to see if your measurements and recordings change.  How are sleep studies done? Most sleep studies are done over one full night of sleep.  You will arrive at the study center in the evening and can go home in the morning.  Bring your pajamas and toothbrush.  Do not have caffeine on the day of your sleep study.  Your health care provider will let you know  if you need to stop taking any of your regular medicines before the test.  To do the tests included in a polysomnogram, you will have:  Round, sticky patches with sensors attached to recording wires (electrodes) placed on your scalp, face, chest, and limbs.  Wires from all the electrodes and sensors run from your bed to a computer. The wires can be taken off and put back on if you need to get out of bed to go to the bathroom.  A sensor placed over your nose to measure airflow.  A finger clip put on one finger to measure your blood oxygen level.  A belt around your belly and a belt around your chest to measure breathing movements.  Where are sleep studies done? Sleep studies are done at sleep centers. A sleep center may be inside a hospital, office, or clinic. The room where you have the study may look like a hospital room or a hotel room. The health care providers doing the study may come in and out of the room during the study. Most of the time, they will be in another room monitoring your test. How is information from sleep studies helpful? A polysomnogram can be used along with your medical  history and a physical exam to diagnose conditions, such as:  Sleep apnea.  Restless legs syndrome.  Sleep-related seizure disorders.  Sleep-related movement disorders.  A medical doctor who specializes in sleep will evaluate your sleep study. The specialist will share the results with your primary health care provider. Treatments based on your sleep study may include:  Improving your sleep habits (sleep hygiene).  Wearing a CPAP mask.  Wearing an oral device at night to improve breathing and reduce snoring.  Taking medicine for: ? Restless legs syndrome. ? Sleep-related seizure disorder. ? Sleep-related movement disorder.  This information is not intended to replace advice given to you by your health care provider. Make sure you discuss any questions you have with your health care  provider. Document Released: 10/18/2002 Document Revised: 12/08/2015 Document Reviewed: 06/19/2013 Elsevier Interactive Patient Education  [employer].

## 2018-03-03 NOTE — Telephone Encounter (Signed)
-----   Message from Elliot Cousin, [place] sent at 03/03/2018  1:03 PM EST ----- PT NEEDS A SLEEP STUDY

## 2018-03-03 NOTE — Telephone Encounter (Signed)
Referral to endo placed as requested.

## 2018-03-03 NOTE — Progress Notes (Signed)
Cardiology Office Note    Date:  03/03/2018  ID:  Heather Myers, DOB [birth year], MRN [id number] PCP:  [place], Betty G, MD  Cardiologist:  Sinclair Grooms, MD   Chief Complaint: "here for refills"/dyspnea on exertion  History of Present Illness:  Heather Myers is a 52 y.o. female with history of morbid obesity, fluid retention, mild OSA (by prior sleep study), DM, HTN, HLD (followed by PCP), IDA (related to prior heavy menstruation) who presents for follow-up. She has been followed by Dr. Tamala Julian for dyspnea. Nuclear stress test 07/2010 showed no ischemia, probably motion artifact, possibility of EF abnormality, but clarified with normal echo. Last 2D echo 05/2016 showed EF 55-60%, grade 1 DD. She has a history of fluid retention treated with Lasix. At last OV 01/2017 there was a request from dermatology to employee spironolactone in her regimen as a means to control hair loss and acne. At that visit, Lasix was decreased to 62m daily and spironolactone was added at 526mdaily. She has a h/o palpitations controlled with metoprolol. Last labs 05/2017 LDL 132 -> Lipitor increased from 10->20->4011mA1C 8.4, K 3.9, Cr 0.59, CBC wnl, 08/2016 TSH wnl. She was referred to endocrinology by PCP and did not go earlier this year but plans to establish with a Dr. [facility]osey [facility] for follow-up. She reports chronic 2-pillow orthopnea which is unchanged but does report she has noticed increased dyspnea with exertion such as going up steps. No CP. Lower extremity edema is well controlled. Palpitations are controlled. She reports increased daytime fatigue and generally feeling unrested. Family has history of stroke but no MI, CHF, arrhythmia.    Past Medical History:  Diagnosis Date  . Allergic rhinitis   . Anemia   . Anxiety   . Arthritis   . Asthma, mild intermittent 12/19/2015  . Complication of anesthesia    difficulty waking up from anesthesia  . Depression   .  Diabetes mellitus type 2 in obese ([place] . Echocardiogram shows left ventricular diastolic dysfunction   . Fluid retention   . Hyperlipidemia   . Hypertension   . Morbid [place] ([facility] . Pneumonia    history of  . Sleep apnea    mild sleep, no CPAP needed at this time    Past Surgical History:  Procedure Laterality Date  . ABDOMINAL SURGERY     chronic abd wall abscess  . CESAREAN SECTION     x's 2  . CHOLECYSTECTOMY    . DILATATION & CURETTAGE/HYSTEROSCOPY WITH MYOSURE N/A 12/08/2016   Procedure: DILATATION & CURETTAGE/HYSTEROSCOPY WITH MYOSURE;  Surgeon: JerSalvadore DomD;  [facility];  Service: Gynecology;  Laterality: N/A;  . INTRAUTERINE DEVICE (IUD) INSERTION N/A 12/08/2016   Procedure: MIRENA INTRAUTERINE DEVICE (IUD) INSERTION (IUD FROM OFFICE);  Surgeon: JerSalvadore DomD;  [facility];  Service: Gynecology;  Laterality: N/A;  . KNEE ARTHROSCOPY Right   . LIPOMA EXCISION  08/03/2011   Procedure: EXCISION LIPOMA;  Surgeon: DouHarl BowieD;  [facility];  Laterality: Right;  wide excision chronic abscess Right lower abdominal wall  . OOPHORECTOMY     [facility]   Current Medications: Current Meds  Medication Sig  . albuterol (PROVENTIL HFA;VENTOLIN HFA) 108 (90 Base) MCG/ACT inhaler Inhale 2 puffs into the lungs every 6 (six) hours as needed for wheezing or  shortness of breath.  Marland Kitchen atorvastatin (LIPITOR) 40 MG tablet Take 1 tablet (40 mg total) by mouth daily.  Marland Kitchen BAYER MICROLET LANCETS lancets Use as instructed to check sugar up to 6 times daily  . betamethasone valerate ointment (VALISONE) 0.1 % Apply 1 application topically daily as needed. ECZEMA  . Blood Glucose Monitoring Suppl (BAYER CONTOUR MONITOR) w/Device KIT Use to check sugar daily  . citalopram (CELEXA) 40 MG tablet TAKE 1 TABLET BY MOUTH EVERY DAY  . clobetasol cream (TEMOVATE) 1.11 % Apply 1 application  topically as needed (infection or skin irritation).  Marland Kitchen glucose blood (BAYER CONTOUR TEST) test strip Use as instructed to check sugar 6 times daily  . hydroquinone 4 % cream Apply 1 application topically 2 (two) times daily as needed (skin discoloration).  Marland Kitchen ibuprofen (ADVIL,MOTRIN) 600 MG tablet Take 600 mg by mouth every 6 (six) hours as needed for moderate pain.  . Insulin Pen Needle 31G X 5 MM MISC Use 4x a day  . INVOKANA 300 MG TABS tablet TAKE 1 TABLET BY MOUTH EVERY DAY  . metFORMIN (GLUCOPHAGE-XR) 500 MG 24 hr tablet TAKE 2 TABLETS BY MOUTH TWICE A DAY  . metoprolol succinate (TOPROL-XL) 25 MG 24 hr tablet Take one tablet by mouth daily.( Take with 50 mg to total 75 mg daily.) please make appt with Dr. Tamala Julian before anymore refills. 1st attempt  . metoprolol succinate (TOPROL-XL) 50 MG 24 hr tablet Take 1 tablet by mouth daily with (25 mg) total 75 mg.Take with or immediately following a meal. Please make appt with Dr. Tamala Julian.1st attempt  . Multiple Vitamins-Minerals (MULTIVITAMIN WITH MINERALS) tablet Take 1 tablet by mouth daily.  Marland Kitchen NOVOLOG FLEXPEN 100 UNIT/ML FlexPen INJECT 25 UNITS 3 TIMES A DAY *15 MINUTES BEFORE MEALS*  . phenazopyridine (PYRIDIUM) 200 MG tablet Take 200 mg by mouth as needed for pain.  . silver sulfADIAZINE (SILVADENE) 1 % cream Apply 1 application topically as needed (skin irritation).  Marland Kitchen spironolactone (ALDACTONE) 25 MG tablet Take 2 tablets (50 mg total) by mouth daily. Please make overdue appt with Dr. Tamala Julian for future refills. [phone]. 1st attempt.  Nelva Nay SOLOSTAR 300 UNIT/ML SOPN INJECT 75 UNITS INTO THE SKIN DAILY.  . [DISCONTINUED] clobetasol cream (TEMOVATE) 3.01 % Apply 1 application topically 2 (two) times daily. (Patient taking differently: Apply 1 application topically as needed (skin infection). )  . [DISCONTINUED] phenazopyridine (PYRIDIUM) 200 MG tablet Take 1 tablet (200 mg total) by mouth 3 (three) times daily as needed. (Patient taking  differently: Take 200 mg by mouth 3 (three) times daily as needed for pain. )  . [DISCONTINUED] silver sulfADIAZINE (SILVADENE) 1 % cream Apply 1 application topically daily. (Patient taking differently: Apply 1 application topically as needed (irritation (skin)). )      Allergies:   Ramipril   Social History   Socioeconomic History  . Marital status: Legally Separated    Spouse name: Not on file  . Number of children: Not on file  . Years of education: Not on file  . Highest education level: Not on file  Occupational History  . Not on file  Social Needs  . Financial resource strain: Not on file  . Food insecurity:    Worry: Not on file    Inability: Not on file  . Transportation needs:    Medical: Not on file    Non-medical: Not on file  Tobacco Use  . Smoking status: Never Smoker  . Smokeless tobacco: Never Used  .  Tobacco comment: Married  Substance and Sexual Activity  . Alcohol use: Yes    Comment: rare  . Drug use: No  . Sexual activity: Not Currently    Partners: Male    Birth control/protection: IUD  Lifestyle  . Physical activity:    Days per week: Not on file    Minutes per session: Not on file  . Stress: Not on file  Relationships  . Social connections:    Talks on phone: Not on file    Gets together: Not on file    Attends religious service: Not on file    Active member of club or organization: Not on file    Attends meetings of clubs or organizations: Not on file    Relationship status: Not on file  Other Topics Concern  . Not on file  Social History Narrative  . Not on file     Family History:  The patient's family history includes Cancer in her father; Diabetes in her mother and other; Hypertension in her father and mother; Other in her mother.  ROS:   Please see the history of present illness.   All other systems are reviewed and otherwise negative.    PHYSICAL EXAM:   VS:  BP 128/60   Pulse 84   Ht _0  (1.651 m)   Wt 286 lb 1.9 oz  (129.8 kg)   SpO2 97%   BMI 47.61 kg/m   BMI: Body mass index is 47.61 kg/m. GEN: Well nourished, well developed morbidly obese AAF, in no acute distress HEENT: normocephalic, atraumatic Neck: no JVD, carotid bruits, or masses Cardiac: RRR; no murmurs, rubs, or gallops, no edema  Respiratory:  clear to auscultation bilaterally, normal work of breathing GI: soft, nontender, nondistended, + BS MS: no deformity or atrophy Skin: warm and dry, no rash Neuro:  Alert and Oriented x 3, Strength and sensation are intact, follows commands Psych: euthymic mood, full affect  Wt Readings from Last 3 Encounters:  03/03/18 286 lb 1.9 oz (129.8 kg)  12/21/17 284 lb (128.8 kg)  12/13/17 290 lb (131.5 kg)      Studies/Labs Reviewed:   EKG:  EKG was ordered today and personally reviewed by me and demonstrates NSR 84bpm, cannot r/o prior anterior infarct, nonspecific upsloped ST segments II, avF. No change from prior.   Recent Labs: 06/04/2017: Hemoglobin 12.6; Platelets 364 06/15/2017: BUN 12; Creatinine, Ser 0.59; Potassium 3.9; Sodium 136   Lipid Panel    Component Value Date/Time   CHOL 192 06/15/2017 1444   TRIG 129.0 06/15/2017 1444   HDL 33.90 (L) 06/15/2017 1444   CHOLHDL 6 06/15/2017 1444   VLDL 25.8 06/15/2017 1444   LDLCALC 132 (H) 06/15/2017 1444    Additional studies/ records that were reviewed today include: Summarized above.  ASSESSMENT & PLAN:   1. Dyspnea on exertion - she has been evaluated for this in the past. Dr. Tamala Julian was previously concerned for pulmonary HTN but she had normal PA pressures in 05/2016. She does have diastolic dysfunction on echo in 05/2016. She reports increased SOB with exertion over the last few months. She is not tachycardic, tachypneic or hypoxic. I will check labs and update 2D echocardiogram. If these are unrevealing, may need to repeat nuclear stress testing although body habitus makes noninvasive testing challenging. It may be that symptoms are  related to morbid obesity and deconditioning as she doesn't exercise, but she also has numerous cardiac risk factors so further eval as above is warranted.  Check BNP with labs. Her 10 year CV risk is 14.7% thus I have advised she discuss with OB GYN whether she can start a baby aspirin daily as this would be indicated.  2. Essential HTN - controlled. 3. Fluid retention / h/o diastolic dysfunction - check BNP with labs given orthopnea. Some of this may be related to body habitus which makes volume assessment difficult, but she has no edema on exam fortunately. Continue diuretics and check lytes. 4. Fatigue - high risk for OSA. Will repeat sleep study. 5. Hyperlipidemia - will add lipid profile and LFTs to labs today.  6. History of palpitations - quiescent on metoprolol. 7. Morbid obesity - discussed impact on medical conditions. She is agreeable for referral to healthy weight and wellness center.  Disposition: F/u with Dr. Tamala Julian as previously planned in 04/2018.  Medication Adjustments/Labs and Tests Ordered: Current medicines are reviewed at length with the patient today.  Concerns regarding medicines are outlined above. Medication changes, Labs and Tests ordered today are summarized above and listed in the Patient Instructions accessible in Encounters.   Signed, Charlie Pitter, PA-C  03/03/2018 12:34 PM    [facility] [address], [place], [place]  [address] Phone: [phone]; Fax: [phone]

## 2018-03-04 ENCOUNTER — Telehealth: Payer: Self-pay | Admitting: Physician Assistant

## 2018-03-04 LAB — COMPREHENSIVE METABOLIC PANEL
ALBUMIN: 4.4 g/dL (ref 3.5–5.5)
ALT: 14 IU/L (ref 0–32)
AST: 17 IU/L (ref 0–40)
Albumin/Globulin Ratio: 1.2 (ref 1.2–2.2)
Alkaline Phosphatase: 133 IU/L — ABNORMAL HIGH (ref 39–117)
BUN / CREAT RATIO: 24 — AB (ref 9–23)
BUN: 18 mg/dL (ref 6–24)
Bilirubin Total: 0.4 mg/dL (ref 0.0–1.2)
CALCIUM: 10.4 mg/dL — AB (ref 8.7–10.2)
CO2: 22 mmol/L (ref 20–29)
CREATININE: 0.75 mg/dL (ref 0.57–1.00)
Chloride: 100 mmol/L (ref 96–106)
GFR, EST AFRICAN AMERICAN: 107 mL/min/{1.73_m2} (ref >59)
GFR, EST NON AFRICAN AMERICAN: 93 mL/min/{1.73_m2} (ref >59)
Globulin, Total: 3.7 g/dL (ref 1.5–4.5)
Glucose: 96 mg/dL (ref 65–99)
Potassium: 4.4 mmol/L (ref 3.5–5.2)
Sodium: 140 mmol/L (ref 134–144)
TOTAL PROTEIN: 8.1 g/dL (ref 6.0–8.5)

## 2018-03-04 LAB — CBC
HEMATOCRIT: 46.5 % (ref 34.0–46.6)
Hemoglobin: 15.6 g/dL (ref 11.1–15.9)
MCH: 31 pg (ref 26.6–33.0)
MCHC: 33.5 g/dL (ref 31.5–35.7)
MCV: 92 fL (ref 79–97)
Platelets: 447 10*3/uL (ref 150–450)
RBC: 5.03 x10E6/uL (ref 3.77–5.28)
RDW: 12.8 % (ref 12.3–15.4)
WBC: 10.7 10*3/uL (ref 3.4–10.8)

## 2018-03-04 LAB — TSH: TSH: 2.02 u[IU]/mL (ref 0.450–4.500)

## 2018-03-04 LAB — LIPID PANEL
CHOLESTEROL TOTAL: 156 mg/dL (ref 100–199)
Chol/HDL Ratio: 3.6 ratio (ref 0.0–4.4)
HDL: 43 mg/dL (ref >39)
LDL CALC: 75 mg/dL (ref 0–99)
Triglycerides: 188 mg/dL — ABNORMAL HIGH (ref 0–149)
VLDL CHOLESTEROL CAL: 38 mg/dL (ref 5–40)

## 2018-03-04 LAB — PRO B NATRIURETIC PEPTIDE: NT-Pro BNP: 11 pg/mL (ref 0–249)

## 2018-03-04 NOTE — Telephone Encounter (Signed)
New Message ° ° ° ° ° ° ° ° ° °Patient returned your call, would like a call back. °

## 2018-03-07 NOTE — Telephone Encounter (Signed)
Returned pts call and she has been made aware of her lab results and recommendations to f/u with her pcp re: calcium / alk phos. Pt verbalized understanding.

## 2018-03-08 NOTE — Telephone Encounter (Signed)
  Laurann Montana PA-C  Reesa Chew, CMA        Yes. Thanks      ----- Message -----  From: Reesa Chew, CMA  Sent: 03/08/2018  8:51 AM EST  To: Laurann Montana, PA-C  Subject: Home sleep                    In lab denied, is home sleep ok? Thanks, Coralee North

## 2018-03-08 NOTE — Telephone Encounter (Addendum)
Informed patient of upcoming home sleep study.  Patient is aware of  Home Sleep Study through [facility]. Patient is scheduled for 03/29/18 at 9 am to pick up home sleep kit and meet with Respiratory therapist at [facility]. Patient is aware that if this appointment date and time does not work for them they should contact Artis Delay directly at [phone]. Patient is aware that a sleep packet will be sent from [facility] in week. Left detailed message on voicemail with date and time of sleep study and informed patient to call back to confirm or reschedule.

## 2018-03-08 NOTE — Addendum Note (Signed)
Addended by: Reesa Chew on: 03/08/2018 03:11 PM   Modules accepted: Orders

## 2018-03-10 ENCOUNTER — Ambulatory Visit (HOSPITAL_COMMUNITY): Payer: BLUE CROSS/BLUE SHIELD

## 2018-03-11 ENCOUNTER — Encounter: Payer: Self-pay | Admitting: Family Medicine

## 2018-03-11 ENCOUNTER — Ambulatory Visit: Payer: BLUE CROSS/BLUE SHIELD | Admitting: Family Medicine

## 2018-03-11 DIAGNOSIS — Z794 Long term (current) use of insulin: Secondary | ICD-10-CM

## 2018-03-11 DIAGNOSIS — Z1211 Encounter for screening for malignant neoplasm of colon: Secondary | ICD-10-CM | POA: Diagnosis not present

## 2018-03-11 DIAGNOSIS — R748 Abnormal levels of other serum enzymes: Secondary | ICD-10-CM | POA: Diagnosis not present

## 2018-03-11 DIAGNOSIS — E1165 Type 2 diabetes mellitus with hyperglycemia: Secondary | ICD-10-CM

## 2018-03-11 DIAGNOSIS — IMO0002 Reserved for concepts with insufficient information to code with codable children: Secondary | ICD-10-CM

## 2018-03-11 DIAGNOSIS — R5383 Other fatigue: Secondary | ICD-10-CM

## 2018-03-11 DIAGNOSIS — L219 Seborrheic dermatitis, unspecified: Secondary | ICD-10-CM

## 2018-03-11 LAB — HEMOGLOBIN A1C: Hgb A1c MFr Bld: 8.5 % — ABNORMAL HIGH (ref 4.6–6.5)

## 2018-03-11 LAB — VITAMIN D 25 HYDROXY (VIT D DEFICIENCY, FRACTURES): VITD: 16.81 ng/mL — ABNORMAL LOW (ref 30.00–100.00)

## 2018-03-11 LAB — GAMMA GT: GGT: 21 U/L (ref 7–51)

## 2018-03-11 MED ORDER — INSULIN ASPART 100 UNIT/ML FLEXPEN: PEN_INJECTOR | SUBCUTANEOUS | 2 refills | Status: DC

## 2018-03-11 MED ORDER — CLOTRIMAZOLE-BETAMETHASONE 1-0.05 % EX CREA: 1.0000 "application " | TOPICAL_CREAM | Freq: Every day | CUTANEOUS | 0 refills | Status: DC | PRN

## 2018-03-11 NOTE — Assessment & Plan Note (Signed)
Left external ear. No erythema, edema, or tenderness. Recommend small amount of topical steroid. She will monitor for changes/warning signs. Follow-up as needed.

## 2018-03-11 NOTE — Progress Notes (Signed)
ACUTE VISIT   HPI:  Chief Complaint  Patient presents with  . Follow-up for blood work that was done at Cardiology office    had labs done last week at cardiology office and they suggested that patient follow-up with PCP   . Recurrent rash in ear    [name] is a 52 y.o. female, who is here today concerned about some l results of labs done recently at her cardiologist's office.  HyperC++:  Ca++ 10.4 No known history of vitamin D deficiency. Negative for headache,abdominal pain,changes in bowel habits,MS changes,or cramps.  No recent bone surgery or fractures.  She is feeling "extremely" fatigue despite a good sleep,worse for the past 2 months. She does not feel rested when she first gets up. No having difficulty staying awake during the day. Pending home sleep study has already been arranged.   Lab Results  Component Value Date   TSH 2.020 03/03/2018   Lab Results  Component Value Date   WBC 10.7 03/03/2018   HGB 15.6 03/03/2018   HCT 46.5 03/03/2018   MCV 92 03/03/2018   PLT 447 03/03/2018    DM II: Currently she is on metformin XR 2 tablets twice daily, Invokana 300 mg daily,Toujeo 75 units daily, and NovoLog 25 to 30 units 3 times daily before meals. Poor compliance with follow-ups. She has seen endocrinologist in the past, recently she requested referral to a different endocrinologist, Dr. Posey Pronto.  She has not received appointment information. FG at home 160-170s. Postprandial glucose upper 200s.  Denies abdominal pain, nausea,vomiting, polydipsia,polyuria, or polyphagia.   Lab Results  Component Value Date   HGBA1C 8.4 (H) 06/15/2017    She is also complaining about intermittent pruritus of external aspect of left ear, occasionally she has some achy. Problem has been going on for 2 months.  Pruritic and scaly area. She wonders if it is related to excess wax. She uses Qtips.  She has not noted erythema or edema. No  history of trauma. History of a scalp pruritus, subretinal otitis. She has used OTC Neosporin. It seems to be better.  She is not exercising regularly and has not been consistent with a healthful diet.   Review of Systems  Constitutional: Positive for fatigue. Negative for activity change, appetite change and fever.  HENT: Negative for mouth sores and nosebleeds.   Eyes: Negative for redness and visual disturbance.  Respiratory: Negative for cough, shortness of breath and wheezing.   Cardiovascular: Negative for chest pain, palpitations and leg swelling.  Gastrointestinal: Negative for abdominal pain, nausea and vomiting.       Negative for changes in bowel habits.  Endocrine: Negative for polydipsia, polyphagia and polyuria.  Genitourinary: Negative for decreased urine volume, dysuria and hematuria.  Musculoskeletal: Negative for gait problem and myalgias.  Skin: Positive for rash. Negative for wound.  Allergic/Immunologic: Positive for environmental allergies.  Neurological: Negative for syncope, weakness and headaches.  Hematological: Negative for adenopathy. Does not bruise/bleed easily.  Psychiatric/Behavioral: Negative for confusion. The patient is nervous/anxious.       Current Outpatient Medications on File Prior to Visit  Medication Sig Dispense Refill  . albuterol (PROVENTIL HFA;VENTOLIN HFA) 108 (90 Base) MCG/ACT inhaler Inhale 2 puffs into the lungs every 6 (six) hours as needed for wheezing or shortness of breath. 8.5 g 2  . atorvastatin (LIPITOR) 40 MG tablet Take 1 tablet (40 mg total) by mouth daily. 90 tablet 2  . BAYER MICROLET LANCETS lancets Use as  instructed to check sugar up to 6 times daily 600 each 5  . betamethasone valerate ointment (VALISONE) 0.1 % Apply 1 application topically daily as needed. ECZEMA  0  . Blood Glucose Monitoring Suppl (BAYER CONTOUR MONITOR) w/Device KIT Use to check sugar daily 1 each 0  . citalopram (CELEXA) 40 MG tablet TAKE 1  TABLET BY MOUTH EVERY DAY 90 tablet 2  . clobetasol cream (TEMOVATE) 0.09 % Apply 1 application topically as needed (infection or skin irritation).    . furosemide (LASIX) 20 MG tablet Take 1 tablet (20 mg total) by mouth daily. 90 tablet 0  . glucose blood (BAYER CONTOUR TEST) test strip Use as instructed to check sugar 6 times daily 600 each 5  . hydroquinone 4 % cream Apply 1 application topically 2 (two) times daily as needed (skin discoloration).    Marland Kitchen ibuprofen (ADVIL,MOTRIN) 600 MG tablet Take 600 mg by mouth every 6 (six) hours as needed for moderate pain.    . Insulin Pen Needle 31G X 5 MM MISC Use 4x a day 400 each 3  . INVOKANA 300 MG TABS tablet TAKE 1 TABLET BY MOUTH EVERY DAY 90 tablet 3  . metFORMIN (GLUCOPHAGE-XR) 500 MG 24 hr tablet TAKE 2 TABLETS BY MOUTH TWICE A DAY 360 tablet 0  . metoprolol succinate (TOPROL-XL) 25 MG 24 hr tablet Take one tablet by mouth daily.( Take with 50 mg to total 75 mg daily.) please make appt with Dr. Tamala Julian before anymore refills. 1st attempt 90 tablet 0  . metoprolol succinate (TOPROL-XL) 50 MG 24 hr tablet Take 1 tablet by mouth daily with (25 mg) total 75 mg.Take with or immediately following a meal. Please make appt with Dr. Tamala Julian.1st attempt 90 tablet 0  . Multiple Vitamins-Minerals (MULTIVITAMIN WITH MINERALS) tablet Take 1 tablet by mouth daily.    . phenazopyridine (PYRIDIUM) 200 MG tablet Take 200 mg by mouth as needed for pain.    . silver sulfADIAZINE (SILVADENE) 1 % cream Apply 1 application topically as needed (skin irritation).    Marland Kitchen spironolactone (ALDACTONE) 25 MG tablet Take 2 tablets (50 mg total) by mouth daily. 180 tablet 0  . TOUJEO SOLOSTAR 300 UNIT/ML SOPN INJECT 75 UNITS INTO THE SKIN DAILY. 22.5 pen 2   No current facility-administered medications on file prior to visit.      Past Medical History:  Diagnosis Date  . Allergic rhinitis   . Anemia   . Anxiety   . Arthritis   . Asthma, mild intermittent 12/19/2015  .  Complication of anesthesia    difficulty waking up from anesthesia  . Depression   . Diabetes mellitus type 2 in obese ([place])   . Echocardiogram shows left ventricular diastolic dysfunction   . Fluid retention   . Hyperlipidemia   . Hypertension   . Morbid obesity ([place])   . Pneumonia    history of  . Sleep apnea    mild sleep, no CPAP needed at this time   Allergies  Allergen Reactions  . Ramipril Shortness Of Breath    Social History   Socioeconomic History  . Marital status: Legally Separated    Spouse name: Not on file  . Number of children: Not on file  . Years of education: Not on file  . Highest education level: Not on file  Occupational History  . Not on file  Social Needs  . Financial resource strain: Not on file  . Food insecurity:    Worry: Not on  file    Inability: Not on file  . Transportation needs:    Medical: Not on file    Non-medical: Not on file  Tobacco Use  . Smoking status: Never Smoker  . Smokeless tobacco: Never Used  . Tobacco comment: Married  Substance and Sexual Activity  . Alcohol use: Yes    Comment: rare  . Drug use: No  . Sexual activity: Not Currently    Partners: Male    Birth control/protection: IUD  Lifestyle  . Physical activity:    Days per week: Not on file    Minutes per session: Not on file  . Stress: Not on file  Relationships  . Social connections:    Talks on phone: Not on file    Gets together: Not on file    Attends religious service: Not on file    Active member of club or organization: Not on file    Attends meetings of clubs or organizations: Not on file    Relationship status: Not on file  Other Topics Concern  . Not on file  Social History Narrative  . Not on file    Vitals:   03/11/18 0845  BP: 122/84  Pulse: 87  Resp: 12  Temp: 98.3 F (36.8 C)  SpO2: 99%   Body mass index is 48.92 kg/m.  Wt Readings from Last 3 Encounters:  03/11/18 294 lb (133.4 kg)  03/03/18 286 lb 1.9 oz (129.8 kg)    12/21/17 284 lb (128.8 kg)     Physical Exam  Nursing note and vitals reviewed. Constitutional: She is oriented to person, place, and time. She appears well-developed. No distress.  HENT:  Head: Normocephalic and atraumatic.  Ears:  Mouth/Throat: Oropharynx is clear and moist and mucous membranes are normal.  Eyes: Pupils are equal, round, and reactive to light. Conjunctivae are normal.  Cardiovascular: Normal rate and regular rhythm.  No murmur heard. Respiratory: Effort normal and breath sounds normal. No respiratory distress.  GI: Soft. She exhibits no mass. There is no hepatomegaly. There is no tenderness.  Musculoskeletal: She exhibits no edema.  Lymphadenopathy:    She has no cervical adenopathy.  Neurological: She is alert and oriented to person, place, and time. She has normal strength. No cranial nerve deficit. Gait normal.  Skin: Skin is warm. No rash noted. No erythema.  Left external ear with small scaly area (see graphic HENT),non erythematous and no tender   Psychiatric: Her mood appears anxious.  Well groomed, good eye contact.      ASSESSMENT AND PLAN:   Ms. Amonda was seen today for follow-up for blood work that was done at cardiology office and recurrent rash in ear.  Orders Placed This Encounter  Procedures  . PTH, intact and calcium  . Calcium, ionized  . VITAMIN D 25 Hydroxy (Vit-D Deficiency, Fractures)  . Gamma GT  . Hemoglobin A1c  . Fructosamine  . Ambulatory referral to Gastroenterology   -     clotrimazole-betamethasone (LOTRISONE) cream; Apply 1 application topically daily as needed. Small amount on ear.   Hypercalcemia We discussed possible etiologies for hypercalcemia. Further recommendations will be given according to lab results.   -     PTH, intact and calcium; Future -     Calcium, ionized -     VITAMIN D 25 Hydroxy (Vit-D Deficiency, Fractures) -     Gamma GT -     PTH, intact and calcium  Elevated alkaline phosphatase  level Elevated alkaline phosphatase,?  Fatty liver. Further recommendation will be given according to lab results. We will continue following. -     Gamma GT  Colon cancer screening -     Ambulatory referral to Gastroenterology -     Gamma GT   Insulin dependent type 2 diabetes mellitus, uncontrolled ([place]) Problem has been poorly controlled. No changes in current management, will adjust treatment according to A1c. Appointment with endocrinologist is pending.   Seborrheic dermatitis, unspecified Left external ear. No erythema, edema, or tenderness. Recommend small amount of topical steroid. She will monitor for changes/warning signs. Follow-up as needed.  Morbid obesity ([place]) She has gained about 4 pounds since her last visit here in 07/2017. We discussed benefits of wt loss as well as adverse effects of obesity. Consistency with healthy diet and physical activity is recommended.    Fatigue, unspecified type We discussed possible etiologies: Systemic illness, immunologic,endocrinology,sleep disorder, psychiatric/psychologic, infectious,medications side effects, and idiopathic. Examination today does not suggest a serious process. Healthy diet and regular physical activity may help.  Further recommendations will be given according to lab results.    Return if symptoms worsen or fail to improve.       G. Martinique, MD  [facility]. [place] [facility].

## 2018-03-11 NOTE — Assessment & Plan Note (Signed)
She has gained about 4 pounds since her last visit here in 07/2017. We discussed benefits of wt loss as well as adverse effects of obesity. Consistency with healthy diet and physical activity is recommended.

## 2018-03-11 NOTE — Patient Instructions (Addendum)
A few things to remember from today's visit:   Hypercalcemia - Plan: PTH, intact and calcium, Calcium, ionized, VITAMIN D 25 Hydroxy (Vit-D Deficiency, Fractures), Gamma GT  Elevated alkaline phosphatase level - Plan: Gamma GT  Colon cancer screening - Plan: Ambulatory referral to Gastroenterology, Gamma GT  Insulin dependent type 2 diabetes mellitus, uncontrolled ([facility]) - Plan: insulin aspart (NOVOLOG FLEXPEN) 100 UNIT/ML FlexPen, Hemoglobin A1c, Fructosamine  Seborrheic dermatitis, unspecified - Plan: clotrimazole-betamethasone (LOTRISONE) cream  Fatigue, unspecified type  It is a common symptom associated with multiple factors: psychologic,medications, systemic illness, sleep disorders,infections, and unknown causes. Some work-up can be done to evaluate for common causes as thyroid disease,anemia,diabetes, or abnormalities in calcium,potassium,or sodium. Regular physical activity as tolerated and a healthy diet is usually might help and usually recommended for chronic fatigue.   Please be sure medication list is accurate. If a new problem present, please set up appointment sooner than planned today.

## 2018-03-11 NOTE — Assessment & Plan Note (Signed)
Problem has been poorly controlled. No changes in current management, will adjust treatment according to A1c. Appointment with endocrinologist is pending.

## 2018-03-13 ENCOUNTER — Encounter: Payer: Self-pay | Admitting: Family Medicine

## 2018-03-13 IMAGING — MR MR KNEE*L* W/O CM
4 of 5 series · 31 of 40 positions shown · non-contrast
Comparison: None.

CLINICAL DATA: Left knee pain and popping and swelling.

EXAM:
MRI OF THE LEFT KNEE WITHOUT CONTRAST
TECHNIQUE: Multiplanar, multisequence MR imaging of the knee was performed. No
intravenous contrast was administered.

[Series 6: PD fat-sat · axial · left · 3.0mm · 0.42mm/px · z∈[-61,+68]mm · 9 of 40 slices shown (1 of 3)]
[im 1/40]
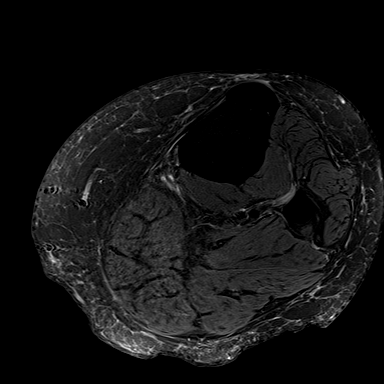
[im 5/40]
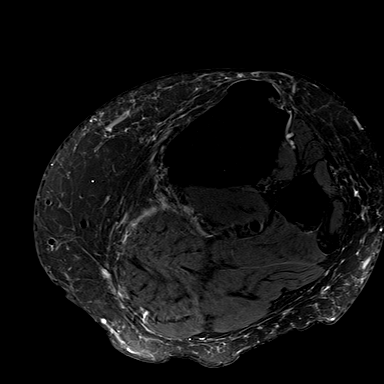
[im 10/40]
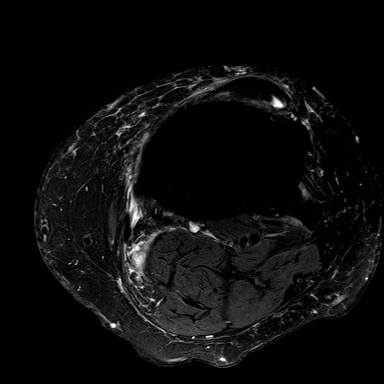
[im 15/40]
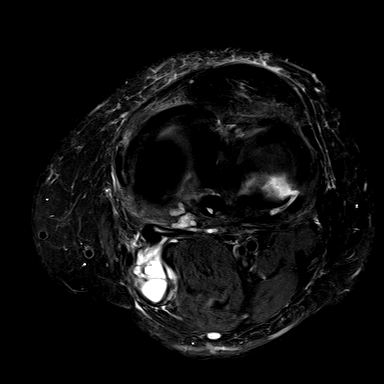
[im 20/40]
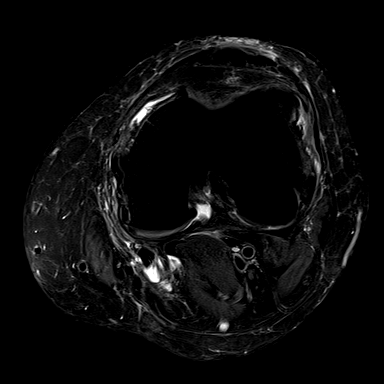
[im 25/40]
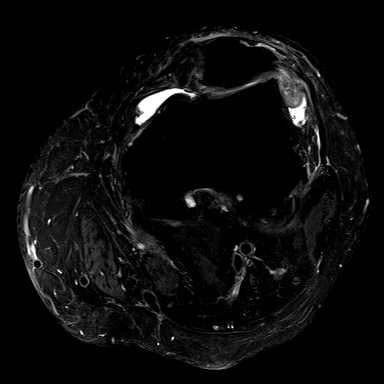
[im 30/40]
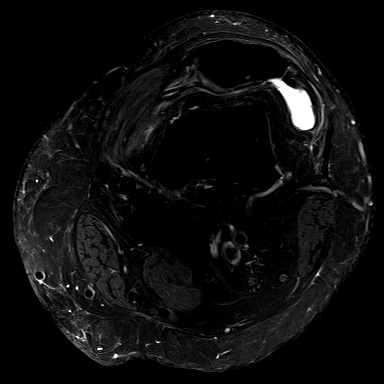
[im 35/40]
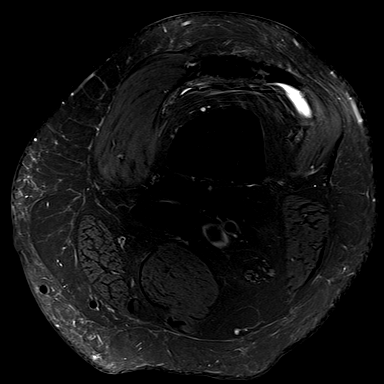
[im 40/40]
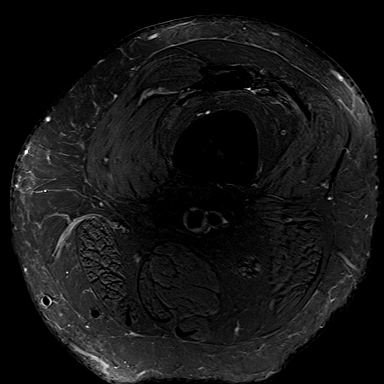

[Series 9: PD fat-sat · sagittal · left · 3.0mm · 0.39mm/px · 7 of 31 slices shown (2 of 3)]
[im 1/31]
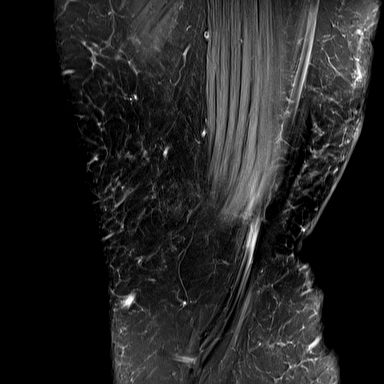
[im 6/31]
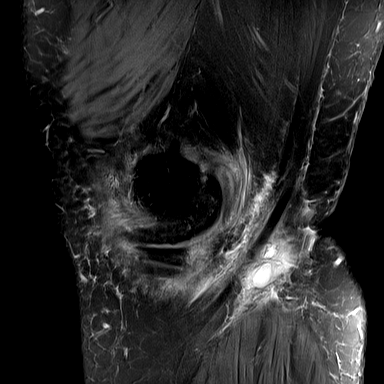
[im 11/31]
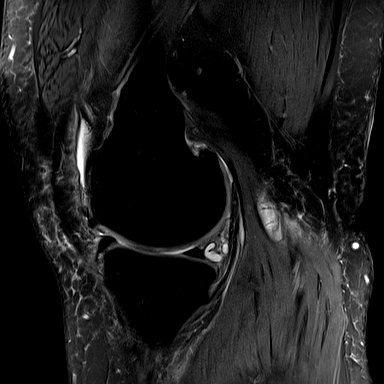
[im 16/31]
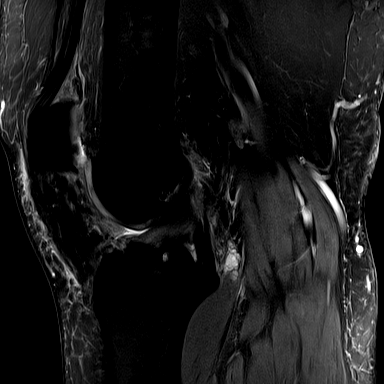
[im 21/31]
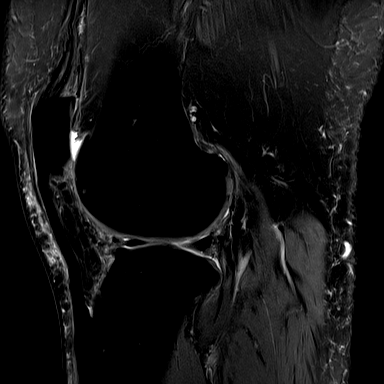
[im 26/31]
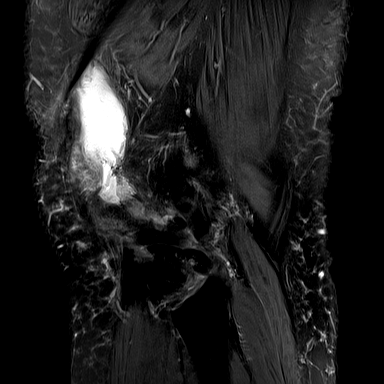
[im 31/31]
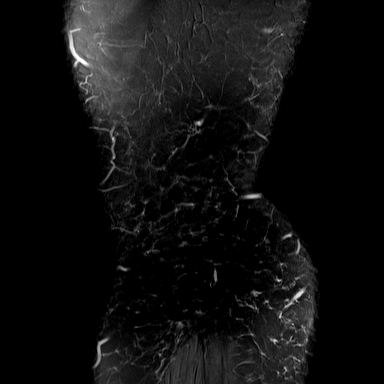

[Series 10: T2 fat-sat · coronal · left · 3.0mm · 0.39mm/px · 7 of 37 slices shown]
[im 1/37]
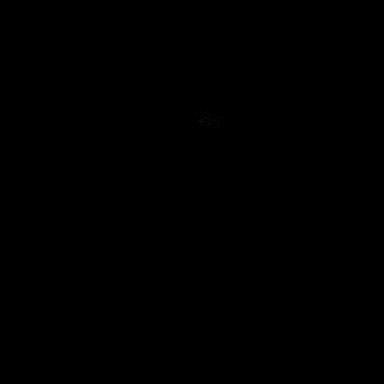
[im 6/37]
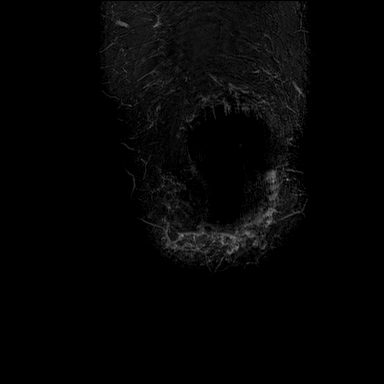
[im 11/37]
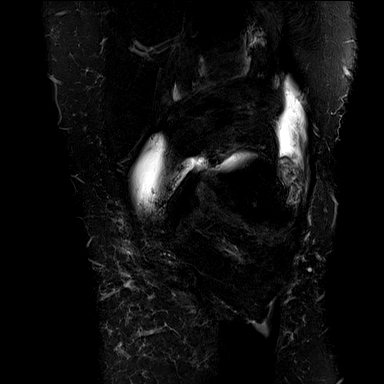
[im 16/37]
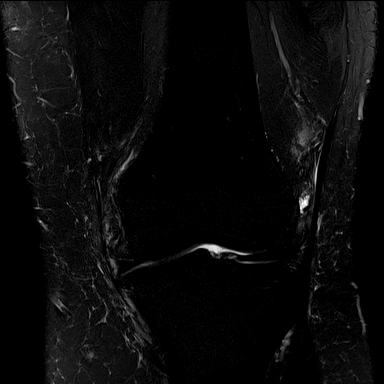
[im 21/37]
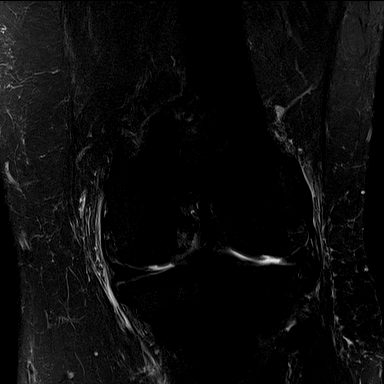
[im 26/37]
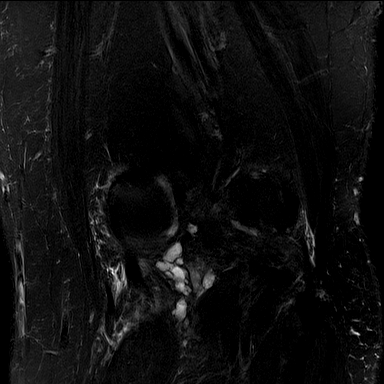
[im 31/37]
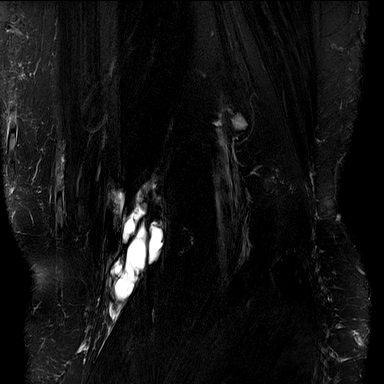

[Series 11: PD fat-sat · coronal · left · 3.0mm · 0.33mm/px · 8 of 37 slices shown (3 of 3)]
[im 1/37]
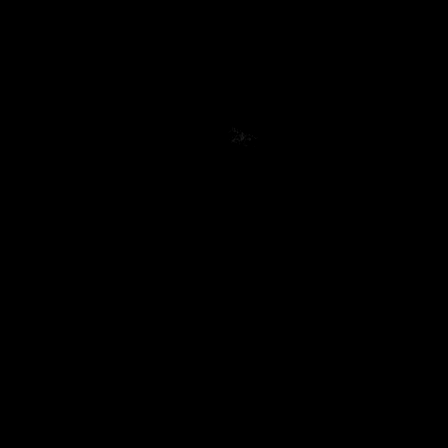
[im 6/37]
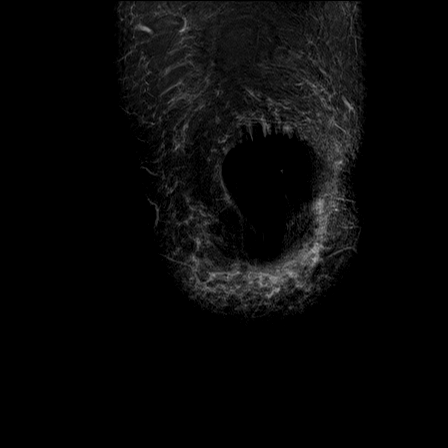
[im 11/37]
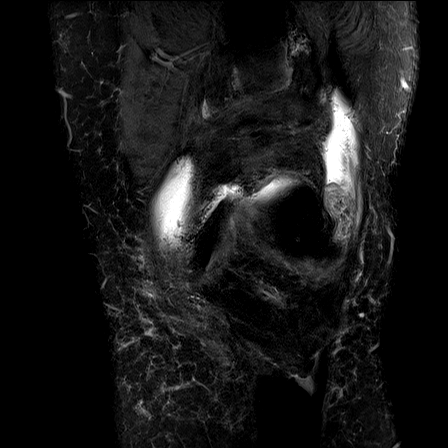
[im 16/37]
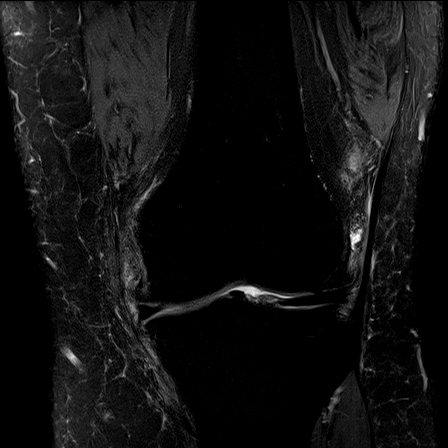
[im 21/37]
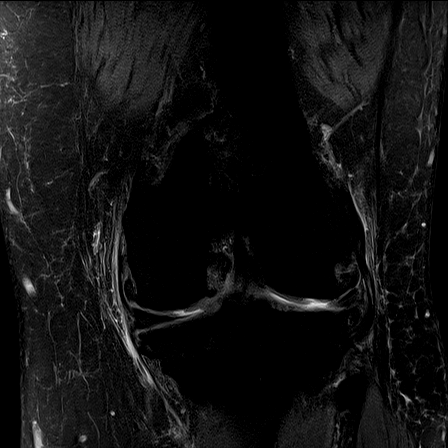
[im 26/37]
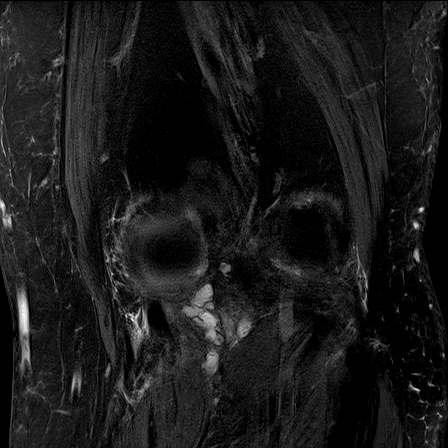
[im 31/37]
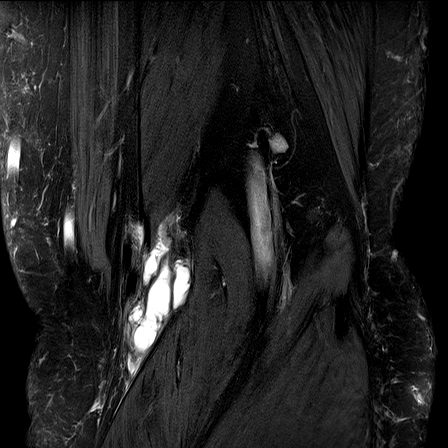
[im 37/37]
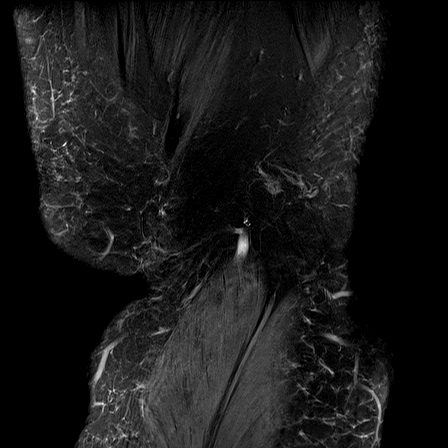

[31 of 40 positions shown; findings below may reference images not displayed]

FINDINGS: MENISCI

Medial meniscus:  Intact.

Lateral meniscus: There is an almost complete radial tear of the
posterior horn at the posterolateral corner with secondary
peripheral subluxation of the midbody of the meniscus.

LIGAMENTS

Cruciates:  Intact ACL and PCL.

Collaterals: Medial collateral ligament is intact. Lateral
collateral ligament complex is intact.

CARTILAGE

Patellofemoral: Diffuse denuding of the articular cartilage of the
apex and lateral facet of the patella. Thinning of the articular
cartilage of the lateral aspect of the trochlear groove.

Medial: Small central fissure in the cartilage of the femoral
condyle.

Lateral: Irregular 10 mm area of full-thickness cartilage loss of
the central portion of the femoral condyle. Thinning of the
articular cartilage of the posterior aspect of the tibial plateau.

Joint:  Small joint effusion with synovial hypertrophy.

Popliteal Fossa:  Complex 5 cm Baker's cyst.

Extensor Mechanism:  Intact quadriceps tendon and patellar tendon.

Bones:  Prominent tricompartmental marginal osteophytes.

Other: None
IMPRESSION: 1. Almost complete radial tear of the posterior horn of the lateral
meniscus at the posterolateral corner.
2. Osteoarthritic changes of all 3 compartments, with full-thickness
cartilage loss most prominently in the patellofemoral and lateral
compartments.
3. Complex Baker's cyst.

## 2018-03-14 LAB — FRUCTOSAMINE: Fructosamine: 245 umol/L (ref 205–285)

## 2018-03-14 LAB — CALCIUM, IONIZED: CALCIUM ION: 5 mg/dL (ref 4.8–5.6)

## 2018-03-15 ENCOUNTER — Encounter: Payer: Self-pay | Admitting: Family Medicine

## 2018-03-15 LAB — PTH, INTACT AND CALCIUM
CALCIUM: 9.2 mg/dL (ref 8.6–10.4)
PTH: 88 pg/mL — AB (ref 14–64)

## 2018-03-18 ENCOUNTER — Other Ambulatory Visit (HOSPITAL_COMMUNITY): Payer: BLUE CROSS/BLUE SHIELD

## 2018-03-23 ENCOUNTER — Other Ambulatory Visit (HOSPITAL_COMMUNITY): Payer: BLUE CROSS/BLUE SHIELD

## 2018-03-28 ENCOUNTER — Encounter: Payer: Self-pay | Admitting: Physician Assistant

## 2018-03-28 NOTE — Progress Notes (Deleted)
52 y.o. [id number] Legally Separated Black or African American Not Hispanic or Latino female here for annual exam.      No LMP recorded.          Sexually active: {yes no:314532}  The current method of family planning is {contraception:315051}.    Exercising: {yes no:314532}  {types:19826} Smoker:  {YES NO:22349}  Health Maintenance: Pap:  03/2016 WNL per patient  History of abnormal Pap:  Yes - had cryosurgery  MMG:  2015 WNL per PAtient  Colonoscopy:  Never BMD:   Never TDaP:  unsure Gardasil: N/A   reports that she has never smoked. She has never used smokeless tobacco. She reports that she drinks alcohol. She reports that she does not use drugs.  Past Medical History:  Diagnosis Date  . Allergic rhinitis   . Anemia   . Anxiety   . Arthritis   . Asthma, mild intermittent 12/19/2015  . Complication of anesthesia    difficulty waking up from anesthesia  . Depression   . Diabetes mellitus type 2 in obese ([place])   . Echocardiogram shows left ventricular diastolic dysfunction   . Fluid retention   . Hyperlipidemia   . Hypertension   . Morbid obesity ([place])   . Pneumonia    history of  . Sleep apnea    mild sleep, no CPAP needed at this time    Past Surgical History:  Procedure Laterality Date  . ABDOMINAL SURGERY     chronic abd wall abscess  . CESAREAN SECTION     x's 2  . CHOLECYSTECTOMY    . DILATATION & CURETTAGE/HYSTEROSCOPY WITH MYOSURE N/A 12/08/2016   Procedure: DILATATION & CURETTAGE/HYSTEROSCOPY WITH MYOSURE;  Surgeon: Salvadore Dom, MD;  Location: [facility];  Service: Gynecology;  Laterality: N/A;  . INTRAUTERINE DEVICE (IUD) INSERTION N/A 12/08/2016   Procedure: MIRENA INTRAUTERINE DEVICE (IUD) INSERTION (IUD FROM OFFICE);  Surgeon: Salvadore Dom, MD;  Location: [facility];  Service: Gynecology;  Laterality: N/A;  . KNEE ARTHROSCOPY Right   . LIPOMA EXCISION  08/03/2011   Procedure: EXCISION LIPOMA;  Surgeon: Harl Bowie, MD;  Location: [facility];  Service: General;  Laterality: Right;  wide excision chronic abscess Right lower abdominal wall  . OOPHORECTOMY     R ovary removed at [facility]      Current Outpatient Medications  Medication Sig Dispense Refill  . albuterol (PROVENTIL HFA;VENTOLIN HFA) 108 (90 Base) MCG/ACT inhaler Inhale 2 puffs into the lungs every 6 (six) hours as needed for wheezing or shortness of breath. 8.5 g 2  . atorvastatin (LIPITOR) 40 MG tablet Take 1 tablet (40 mg total) by mouth daily. 90 tablet 2  . BAYER MICROLET LANCETS lancets Use as instructed to check sugar up to 6 times daily 600 each 5  . betamethasone valerate ointment (VALISONE) 0.1 % Apply 1 application topically daily as needed. ECZEMA  0  . Blood Glucose Monitoring Suppl (BAYER CONTOUR MONITOR) w/Device KIT Use to check sugar daily 1 each 0  . citalopram (CELEXA) 40 MG tablet TAKE 1 TABLET BY MOUTH EVERY DAY 90 tablet 2  . clobetasol cream (TEMOVATE) 1.91 % Apply 1 application topically as needed (infection or skin irritation).    . clotrimazole-betamethasone (LOTRISONE) cream Apply 1 application topically daily as needed. Small amount on ear. 30 g 0  . furosemide (LASIX) 20 MG tablet Take 1 tablet (20 mg total) by mouth daily. 90 tablet 0  . glucose blood (BAYER  CONTOUR TEST) test strip Use as instructed to check sugar 6 times daily 600 each 5  . hydroquinone 4 % cream Apply 1 application topically 2 (two) times daily as needed (skin discoloration).    Marland Kitchen ibuprofen (ADVIL,MOTRIN) 600 MG tablet Take 600 mg by mouth every 6 (six) hours as needed for moderate pain.    Marland Kitchen insulin aspart (NOVOLOG FLEXPEN) 100 UNIT/ML FlexPen INJECT 25-30 UNITS 3 TIMES A DAY *15 MINUTES BEFORE MEALS* 24 pen 2  . Insulin Pen Needle 31G X 5 MM MISC Use 4x a day 400 each 3  . INVOKANA 300 MG TABS tablet TAKE 1 TABLET BY MOUTH EVERY DAY 90 tablet 3  . metFORMIN (GLUCOPHAGE-XR) 500 MG 24 hr tablet TAKE 2 TABLETS BY MOUTH TWICE A DAY 360 tablet 0  .  metoprolol succinate (TOPROL-XL) 25 MG 24 hr tablet Take one tablet by mouth daily.( Take with 50 mg to total 75 mg daily.) please make appt with Dr. Tamala Julian before anymore refills. 1st attempt 90 tablet 0  . metoprolol succinate (TOPROL-XL) 50 MG 24 hr tablet Take 1 tablet by mouth daily with (25 mg) total 75 mg.Take with or immediately following a meal. Please make appt with Dr. Tamala Julian.1st attempt 90 tablet 0  . Multiple Vitamins-Minerals (MULTIVITAMIN WITH MINERALS) tablet Take 1 tablet by mouth daily.    . phenazopyridine (PYRIDIUM) 200 MG tablet Take 200 mg by mouth as needed for pain.    . silver sulfADIAZINE (SILVADENE) 1 % cream Apply 1 application topically as needed (skin irritation).    Marland Kitchen spironolactone (ALDACTONE) 25 MG tablet Take 2 tablets (50 mg total) by mouth daily. 180 tablet 0  . TOUJEO SOLOSTAR 300 UNIT/ML SOPN INJECT 75 UNITS INTO THE SKIN DAILY. 22.5 pen 2   No current facility-administered medications for this visit.     Family History  Problem Relation Age of Onset  . Diabetes Mother   . Hypertension Mother   . Other Mother        renal failure  . Diabetes Other   . Hypertension Father   . Cancer Father     Review of Systems  Exam:   There were no vitals taken for this visit.  Weight change: '@WEIGHTCHANGE' @ Height:      Ht Readings from Last 3 Encounters:  03/11/18 '5\' 5"'  (1.651 m)  03/03/18 '5\' 5"'  (1.651 m)  08/11/17 '5\' 5"'  (1.651 m)    General appearance: alert, cooperative and appears stated age Head: Normocephalic, without obvious abnormality, atraumatic Neck: no adenopathy, supple, symmetrical, trachea midline and thyroid {CHL AMB PHY EX THYROID NORM DEFAULT:[phone]::"normal to inspection and palpation"} Lungs: clear to auscultation bilaterally Cardiovascular: regular rate and rhythm Breasts: {Exam; breast:13139::"normal appearance, no masses or tenderness"} Abdomen: soft, non-tender; non distended,  no masses,  no organomegaly Extremities: extremities  normal, atraumatic, no cyanosis or edema Skin: Skin color, texture, turgor normal. No rashes or lesions Lymph nodes: Cervical, supraclavicular, and axillary nodes normal. No abnormal inguinal nodes palpated Neurologic: Grossly normal   Pelvic: External genitalia:  no lesions              Urethra:  normal appearing urethra with no masses, tenderness or lesions              Bartholins and Skenes: normal                 Vagina: normal appearing vagina with normal color and discharge, no lesions  Cervix: {CHL AMB PHY EX CERVIX NORM DEFAULT:[phone]::"no lesions"}               Bimanual Exam:  Uterus:  {CHL AMB PHY EX UTERUS NORM DEFAULT:[id number]::"normal size, contour, position, consistency, mobility, non-tender"}              Adnexa: {CHL AMB PHY EX ADNEXA NO MASS DEFAULT:[phone]::"no mass, fullness, tenderness"}               Rectovaginal: Confirms               Anus:  normal sphincter tone, no lesions  Chaperone was present for exam.  A:  Well Woman with normal exam  P:

## 2018-03-29 ENCOUNTER — Ambulatory Visit (HOSPITAL_BASED_OUTPATIENT_CLINIC_OR_DEPARTMENT_OTHER): Payer: BLUE CROSS/BLUE SHIELD | Attending: Physician Assistant

## 2018-03-30 ENCOUNTER — Ambulatory Visit: Payer: BLUE CROSS/BLUE SHIELD | Admitting: Obstetrics and Gynecology

## 2018-03-30 ENCOUNTER — Encounter: Payer: Self-pay | Admitting: Obstetrics and Gynecology

## 2018-04-04 ENCOUNTER — Telehealth: Payer: Self-pay

## 2018-04-04 NOTE — Telephone Encounter (Signed)
New message    Just an FYI. We have made several attempts to contact this patient including sending a letter to schedule or reschedule their echocardiogram. We will be removing the patient from the echo WQ.   Thank you 

## 2018-04-05 ENCOUNTER — Telehealth: Payer: Self-pay

## 2018-04-05 ENCOUNTER — Other Ambulatory Visit: Payer: Self-pay | Admitting: Interventional Cardiology

## 2018-04-05 NOTE — Telephone Encounter (Signed)
Echo ordered by Ronie Spies, PA-C.  Will route to her to make her aware.

## 2018-04-05 NOTE — Telephone Encounter (Signed)
New message    Just an FYI. We have made several attempts to contact this patient including sending a letter to schedule or reschedule their echocardiogram. We will be removing the patient from the echo WQ.   Thank you 

## 2018-04-09 ENCOUNTER — Other Ambulatory Visit: Payer: Self-pay | Admitting: Family Medicine

## 2018-04-09 DIAGNOSIS — Z794 Long term (current) use of insulin: Principal | ICD-10-CM

## 2018-04-09 DIAGNOSIS — E1165 Type 2 diabetes mellitus with hyperglycemia: Secondary | ICD-10-CM

## 2018-04-09 DIAGNOSIS — IMO0002 Reserved for concepts with insufficient information to code with codable children: Secondary | ICD-10-CM

## 2018-04-11 ENCOUNTER — Other Ambulatory Visit: Payer: Self-pay | Admitting: *Deleted

## 2018-04-11 MED ORDER — INSULIN PEN NEEDLE 31G X 5 MM MISC: 3 refills | Status: AC

## 2018-04-25 ENCOUNTER — Other Ambulatory Visit: Payer: Self-pay | Admitting: Family Medicine

## 2018-04-25 DIAGNOSIS — E1169 Type 2 diabetes mellitus with other specified complication: Secondary | ICD-10-CM

## 2018-04-25 DIAGNOSIS — E785 Hyperlipidemia, unspecified: Principal | ICD-10-CM

## 2018-05-02 ENCOUNTER — Other Ambulatory Visit: Payer: Self-pay | Admitting: Family Medicine

## 2018-05-02 DIAGNOSIS — Z794 Long term (current) use of insulin: Principal | ICD-10-CM

## 2018-05-02 DIAGNOSIS — IMO0002 Reserved for concepts with insufficient information to code with codable children: Secondary | ICD-10-CM

## 2018-05-02 DIAGNOSIS — E1165 Type 2 diabetes mellitus with hyperglycemia: Secondary | ICD-10-CM

## 2018-05-23 NOTE — Progress Notes (Deleted)
Cardiology Office Note:    Date:  05/23/2018   ID:  Heather Myers, DOB 12/15/1965, MRN [id number]  PCP:  [place], Betty G, MD  Cardiologist:  Lesleigh Noe, MD   Referring MD: [place], Betty G, MD   No chief complaint on file.   History of Present Illness:    Heather Myers is a 53 y.o. female with a hx of ***  Past Medical History:  Diagnosis Date  . Allergic rhinitis   . Anemia   . Anxiety   . Arthritis   . Asthma, mild intermittent 12/19/2015  . Complication of anesthesia    difficulty waking up from anesthesia  . Depression   . Diabetes mellitus type 2 in obese (HCC)   . Echocardiogram shows left ventricular diastolic dysfunction   . Fluid retention   . Hyperlipidemia   . Hypertension   . Morbid obesity (HCC)   . Pneumonia    history of  . Sleep apnea    mild sleep, no CPAP needed at this time    Past Surgical History:  Procedure Laterality Date  . ABDOMINAL SURGERY     chronic abd wall abscess  . CESAREAN SECTION     x's 2  . CHOLECYSTECTOMY    . DILATATION & CURETTAGE/HYSTEROSCOPY WITH MYOSURE N/A 12/08/2016   Procedure: DILATATION & CURETTAGE/HYSTEROSCOPY WITH MYOSURE;  Surgeon: Romualdo Bolk, MD;  Location: [facility];  Service: Gynecology;  Laterality: N/A;  . INTRAUTERINE DEVICE (IUD) INSERTION N/A 12/08/2016   Procedure: MIRENA INTRAUTERINE DEVICE (IUD) INSERTION (IUD FROM OFFICE);  Surgeon: Romualdo Bolk, MD;  Location: [facility];  Service: Gynecology;  Laterality: N/A;  . KNEE ARTHROSCOPY Right   . LIPOMA EXCISION  08/03/2011   Procedure: EXCISION LIPOMA;  Surgeon: Shelly Rubenstein, MD;  Location: [facility];  Service: General;  Laterality: Right;  wide excision chronic abscess Right lower abdominal wall  . OOPHORECTOMY     R ovary [facility] at [facility]      Current Medications: No outpatient medications have been marked as taking for the 05/24/18 encounter (Appointment) with Lyn Records, MD.      Allergies:   Ramipril   Social History   Socioeconomic History  . Marital status: Legally Separated    Spouse name: Not on file  . Number of children: Not on file  . Years of education: Not on file  . Highest education level: Not on file  Occupational History  . Not on file  Social Needs  . Financial resource strain: Not on file  . Food insecurity:    Worry: Not on file    Inability: Not on file  . Transportation needs:    Medical: Not on file    Non-medical: Not on file  Tobacco Use  . Smoking status: Never Smoker  . Smokeless tobacco: Never Used  . Tobacco comment: Married  Substance and Sexual Activity  . Alcohol use: Yes    Comment: rare  . Drug use: No  . Sexual activity: Not Currently    Partners: Male    Birth control/protection: I.U.D.  Lifestyle  . Physical activity:    Days per week: Not on file    Minutes per session: Not on file  . Stress: Not on file  Relationships  . Social connections:    Talks on phone: Not on file    Gets together: Not on file    Attends religious service: Not on file  Active member of club or organization: Not on file    Attends meetings of clubs or organizations: Not on file    Relationship status: Not on file  Other Topics Concern  . Not on file  Social History Narrative  . Not on file     Family History: The patient's family history includes Cancer in her father; Diabetes in her mother and another family member; Hypertension in her father and mother; Other in her mother.  ROS:   Please see the history of present illness.    *** All other systems reviewed and are negative.  EKGs/Labs/Other Studies Reviewed:    The following studies were reviewed today: ECHOCARDIOGRAM 2/26/2018Study Conclusions  - Left ventricle: The cavity size was normal. Systolic function was   normal. The estimated ejection fraction was in the range of 55%   to 60%. Wall motion was normal; there were no regional wall   motion  abnormalities. Doppler parameters are consistent with   abnormal left ventricular relaxation (grade 1 diastolic   dysfunction). Acoustic contrast opacification revealed no   evidence ofthrombus.   EKG:  EKG ***  Recent Labs: 03/03/2018: ALT 14; BUN 18; Creatinine, Ser 0.75; Hemoglobin 15.6; NT-Pro BNP 11; Platelets 447; Potassium 4.4; Sodium 140; TSH 2.020  Recent Lipid Panel    Component Value Date/Time   CHOL 156 03/03/2018 1312   TRIG 188 (H) 03/03/2018 1312   HDL 43 03/03/2018 1312   CHOLHDL 3.6 03/03/2018 1312   CHOLHDL 6 06/15/2017 1444   VLDL 25.8 06/15/2017 1444   LDLCALC 75 03/03/2018 1312    Physical Exam:    VS:  There were no vitals taken for this visit.    Wt Readings from Last 3 Encounters:  03/11/18 294 lb (133.4 kg)  03/03/18 286 lb 1.9 oz (129.8 kg)  12/21/17 284 lb (128.8 kg)     GEN: ***. No acute distress HEENT: Normal NECK: No JVD. LYMPHATICS: No lymphadenopathy CARDIAC: ***RRR.  *** murmur, ***gallop, ***edema VASCULAR: *** Pulses, *** bruits RESPIRATORY:  Clear to auscultation without rales, wheezing or rhonchi  ABDOMEN: Soft, non-tender, non-distended, No pulsatile mass, MUSCULOSKELETAL: No deformity  SKIN: Warm and dry NEUROLOGIC:  Alert and oriented x 3 PSYCHIATRIC:  Normal affect   ASSESSMENT:    1. Essential hypertension   2. Hyperlipidemia associated with type 2 diabetes mellitus (HCC)   3. Morbid obesity (HCC)   4. Insulin dependent type 2 diabetes mellitus, uncontrolled (HCC)   5. DOE (dyspnea on exertion)   6. Cardiomegaly    PLAN:    In order of problems listed above:  1. ***   Medication Adjustments/Labs and Tests Ordered: Current medicines are reviewed at length with the patient today.  Concerns regarding medicines are outlined above.  No orders of the defined types were placed in this encounter.  No orders of the defined types were placed in this encounter.   There are no Patient Instructions on file for this  visit.   Signed, Lesleigh Noe, MD  05/23/2018 8:20 PM    [facility]

## 2018-05-24 ENCOUNTER — Ambulatory Visit: Payer: BLUE CROSS/BLUE SHIELD | Admitting: Interventional Cardiology

## 2018-05-25 ENCOUNTER — Encounter: Payer: Self-pay | Admitting: Interventional Cardiology

## 2018-05-26 ENCOUNTER — Telehealth: Payer: Self-pay | Admitting: *Deleted

## 2018-05-26 NOTE — Telephone Encounter (Signed)
FYI sent to Dr. [place]. Copied from CRM [id number]. Topic: General - Other >> May 26, 2018  2:48 PM Wyonia Hough E wrote: Reason for CRM: Pt wanted to advised Dr. [place] that she has scheduled an appt with an endocrinologist toward the end of February

## 2018-06-06 NOTE — Progress Notes (Signed)
Chief Complaint  Patient presents with  . Ear Pain    x 4 days. Left ear mostly left sometimes right. Pt states she has popping her ear with headache and feels like there is water in it and clogged. Pt states she has patchy dried skin on outside of ear that was like blisters     HPI: Heather Myers 53 y.o. come in for SDA   PCP NA? Problem with ear .  First onset with a painful itchy rash at the top of her left pinna for which she has pictures treated with hydrocortisone and a topical tea tree oil it is getting better.  It is still sore.  But getting better she does have eczema but no history of contact at that area no history of fever or cold sores. States that her left ear is popping and painful feeling like there is water in it and it is clogged.  But denies any sinus congestion per se sneezing itching coughing.  No nasal discharge. She has diabetes and reported reasonable control.  No history of problems with recurrent ear infections.  There is no discharge from the ear. ROS: See pertinent positives and negatives per HPI.  Past Medical History:  Diagnosis Date  . Allergic rhinitis   . Anemia   . Anxiety   . Arthritis   . Asthma, mild intermittent 12/19/2015  . Complication of anesthesia    difficulty waking up from anesthesia  . Depression   . Diabetes mellitus type 2 in obese ([place])   . Echocardiogram shows left ventricular diastolic dysfunction   . Fluid retention   . Hyperlipidemia   . Hypertension   . Morbid obesity ([place])   . Pneumonia    history of  . Sleep apnea    mild sleep, no CPAP needed at this time    Family History  Problem Relation Age of Onset  . Diabetes Mother   . Hypertension Mother   . Other Mother        renal failure  . Diabetes Other   . Hypertension Father   . Cancer Father     Social History   Socioeconomic History  . Marital status: Legally Separated    Spouse name: Not on file  . Number of children: Not on file  . Years  of education: Not on file  . Highest education level: Not on file  Occupational History  . Not on file  Social Needs  . Financial resource strain: Not on file  . Food insecurity:    Worry: Not on file    Inability: Not on file  . Transportation needs:    Medical: Not on file    Non-medical: Not on file  Tobacco Use  . Smoking status: Never Smoker  . Smokeless tobacco: Never Used  . Tobacco comment: Married  Substance and Sexual Activity  . Alcohol use: Yes    Comment: rare  . Drug use: No  . Sexual activity: Not Currently    Partners: Male    Birth control/protection: I.U.D.  Lifestyle  . Physical activity:    Days per week: Not on file    Minutes per session: Not on file  . Stress: Not on file  Relationships  . Social connections:    Talks on phone: Not on file    Gets together: Not on file    Attends religious service: Not on file    Active member of club or organization: Not on file  Attends meetings of clubs or organizations: Not on file    Relationship status: Not on file  Other Topics Concern  . Not on file  Social History Narrative  . Not on file    Outpatient Medications Prior to Visit  Medication Sig Dispense Refill  . albuterol (PROVENTIL HFA;VENTOLIN HFA) 108 (90 Base) MCG/ACT inhaler Inhale 2 puffs into the lungs every 6 (six) hours as needed for wheezing or shortness of breath. 8.5 g 2  . atorvastatin (LIPITOR) 40 MG tablet TAKE 1 TABLET BY MOUTH EVERY DAY 90 tablet 2  . BAYER MICROLET LANCETS lancets Use as instructed to check sugar up to 6 times daily 600 each 5  . betamethasone valerate ointment (VALISONE) 0.1 % Apply 1 application topically daily as needed. ECZEMA  0  . Blood Glucose Monitoring Suppl (BAYER CONTOUR MONITOR) w/Device KIT Use to check sugar daily 1 each 0  . citalopram (CELEXA) 40 MG tablet TAKE 1 TABLET BY MOUTH EVERY DAY 90 tablet 2  . clobetasol cream (TEMOVATE) 0.63 % Apply 1 application topically as needed (infection or skin  irritation).    . clotrimazole-betamethasone (LOTRISONE) cream Apply 1 application topically daily as needed. Small amount on ear. 30 g 0  . glucose blood (BAYER CONTOUR TEST) test strip Use as instructed to check sugar 6 times daily 600 each 5  . hydroquinone 4 % cream Apply 1 application topically 2 (two) times daily as needed (skin discoloration).    Marland Kitchen ibuprofen (ADVIL,MOTRIN) 600 MG tablet Take 600 mg by mouth every 6 (six) hours as needed for moderate pain.    Marland Kitchen insulin aspart (NOVOLOG FLEXPEN) 100 UNIT/ML FlexPen INJECT 25-30 UNITS 3 TIMES A DAY *15 MINUTES BEFORE MEALS* 24 pen 2  . Insulin Pen Needle 31G X 5 MM MISC Use 4x a day 400 each 3  . INVOKANA 300 MG TABS tablet TAKE 1 TABLET BY MOUTH EVERY DAY 90 tablet 3  . metFORMIN (GLUCOPHAGE-XR) 500 MG 24 hr tablet TAKE 2 TABLETS BY MOUTH TWICE A DAY 360 tablet 0  . metoprolol succinate (TOPROL-XL) 25 MG 24 hr tablet Take one tablet by mouth daily.( Take with 50 mg to total 75 mg daily.) please make appt with Dr. Tamala Julian before anymore refills. 1st attempt 90 tablet 0  . metoprolol succinate (TOPROL-XL) 50 MG 24 hr tablet Take 1 tablet by mouth daily with (25 mg) total 75 mg.Take with or immediately following a meal. Please make appt with Dr. Tamala Julian.1st attempt 90 tablet 0  . Multiple Vitamins-Minerals (MULTIVITAMIN WITH MINERALS) tablet Take 1 tablet by mouth daily.    . phenazopyridine (PYRIDIUM) 200 MG tablet Take 200 mg by mouth as needed for pain.    . silver sulfADIAZINE (SILVADENE) 1 % cream Apply 1 application topically as needed (skin irritation).    Marland Kitchen spironolactone (ALDACTONE) 25 MG tablet Take 2 tablets (50 mg total) by mouth daily. 180 tablet 0  . TOUJEO SOLOSTAR 300 UNIT/ML SOPN INJECT 75 UNITS INTO THE SKIN DAILY. 22.5 pen 2  . furosemide (LASIX) 20 MG tablet Take 1 tablet (20 mg total) by mouth daily. 90 tablet 0   No facility-administered medications prior to visit.      EXAM:  BP 120/78 (BP Location: Right Arm, Patient  Position: Sitting, Cuff Size: Large)   Pulse 92   Temp 98.2 F (36.8 C) (Oral)   Wt 290 lb 4.8 oz (131.7 kg)   LMP 10/25/2017   SpO2 98%   BMI 48.31 kg/m   Body  mass index is 48.31 kg/m.  GENERAL: vitals reviewed and listed above, alert, oriented, appears well hydrated and in no acute distress HEENT: atraumatic, conjunctiva  clear, no obvious abnormalities on inspection of external nose and ears there is some faded eczematous dry patch area on the earlobes no piercings and a hyperpigmented area on the top of the left pinna but no vesicles or blisters.  Right TM normal left EAC is tender but not swollen without discharge and TM is somewhat distorted but bony landmarks are intact dusky TM.  OP : no lesion edema or exudate except for white spot left tonsil question stone NECK: no obvious masses on inspection palpation    BP Readings from Last 3 Encounters:  06/07/18 120/78  03/11/18 122/84  03/03/18 128/60    ASSESSMENT AND PLAN:  Discussed the following assessment and plan:  Left ear pain  Subacute otitis media, unspecified otitis media type  Dysfunction of left eustachian tube  Eczema, unspecified type Rash on the left pinna that is resolving with obviously underlying skin eczema add topical antibiotic without neomycin until better. Abnormal eardrum possibly subacute otitis of uncertain cause does not really look like external otitis without debris in canal.  However she is tender during exam. Treat for ear infection reviewed information about eustachian tube dysfunction and popping If persistent or progressive plan follow-up visit for ear check with Dr. Martinique or if worse.  Expectant management. -Patient advised to return or notify health care team  if  new concerns arise.  Patient Instructions  Use topical antibiotic  On left external ear  May be eczema with  Bacterial infection on top of it  Looks much better than at initial.  Ear looks like some fluid  Behind so  treating for a low grad ear infection.  I dont see any infection in th ear canalat this time . Popping is usually eustachian tube dysfunction  Advise fu with Dr Martinique  If not better in 2 weeks or  Worse     Eustachian Tube Dysfunction  Eustachian tube dysfunction refers to a condition in which a blockage develops in the narrow passage that connects the middle ear to the back of the nose (eustachian tube). The eustachian tube regulates air pressure in the middle ear by letting air move between the ear and nose. It also helps to drain fluid from the middle ear space. Eustachian tube dysfunction can affect one or both ears. When the eustachian tube does not function properly, air pressure, fluid, or both can build up in the middle ear. What are the causes? This condition occurs when the eustachian tube becomes blocked or cannot open normally. Common causes of this condition include:  Ear infections.  Colds and other infections that affect the nose, mouth, and throat (upper respiratory tract).  Allergies.  Irritation from cigarette smoke.  Irritation from stomach acid coming up into the esophagus (gastroesophageal reflux). The esophagus is the tube that carries food from the mouth to the stomach.  Sudden changes in air pressure, such as from descending in an airplane or scuba diving.  Abnormal growths in the nose or throat, such as: ? Growths that line the nose (nasal polyps). ? Abnormal growth of cells (tumors). ? Enlarged tissue at the back of the throat (adenoids). What increases the risk? You are more likely to develop this condition if:  You smoke.  You are overweight.  You are a child who has: ? Certain birth defects of the mouth, such as cleft palate. ?  Large tonsils or adenoids. What are the signs or symptoms? Common symptoms of this condition include:  A feeling of fullness in the ear.  Ear pain.  Clicking or popping noises in the ear.  Ringing in the  ear.  Hearing loss.  Loss of balance.  Dizziness. Symptoms may get worse when the air pressure around you changes, such as when you travel to an area of high elevation, fly on an airplane, or go scuba diving. How is this diagnosed? This condition may be diagnosed based on:  Your symptoms.  A physical exam of your ears, nose, and throat.  Tests, such as those that measure: ? The movement of your eardrum (tympanogram). ? Your hearing (audiometry). How is this treated? Treatment depends on the cause and severity of your condition.  In mild cases, you may relieve your symptoms by moving air into your ears. This is called "popping the ears."  In more severe cases, or if you have symptoms of fluid in your ears, treatment may include: ? Medicines to relieve congestion (decongestants). ? Medicines that treat allergies (antihistamines). ? Nasal sprays or ear drops that contain medicines that reduce swelling (steroids). ? A procedure to drain the fluid in your eardrum (myringotomy). In this procedure, a small tube is placed in the eardrum to:  Drain the fluid.  Restore the air in the middle ear space. ? A procedure to insert a balloon device through the nose to inflate the opening of the eustachian tube (balloon dilation). Follow these instructions at home: Lifestyle  Do not do any of the following until your health care provider approves: ? Travel to high altitudes. ? Fly in airplanes. ? Work in a Pension scheme manager or room. ? Scuba dive.  Do not use any products that contain nicotine or tobacco, such as cigarettes and e-cigarettes. If you need help quitting, ask your health care provider.  Keep your ears dry. Wear fitted earplugs during showering and bathing. Dry your ears completely after. General instructions  Take over-the-counter and prescription medicines only as told by your health care provider.  Use techniques to help pop your ears as recommended by your health care  provider. These may include: ? Chewing gum. ? Yawning. ? Frequent, forceful swallowing. ? Closing your mouth, holding your nose closed, and gently blowing as if you are trying to blow air out of your nose.  Keep all follow-up visits as told by your health care provider. This is important. Contact a health care provider if:  Your symptoms do not go away after treatment.  Your symptoms come back after treatment.  You are unable to pop your ears.  You have: ? A fever. ? Pain in your ear. ? Pain in your head or neck. ? Fluid draining from your ear.  Your hearing suddenly changes.  You become very dizzy.  You lose your balance. Summary  Eustachian tube dysfunction refers to a condition in which a blockage develops in the eustachian tube.  It can be caused by ear infections, allergies, inhaled irritants, or abnormal growths in the nose or throat.  Symptoms include ear pain, hearing loss, or ringing in the ears.  Mild cases are treated with maneuvers to unblock the ears, such as yawning or ear popping.  Severe cases are treated with medicines. Surgery may also be done (rare). This information is not intended to replace advice given to you by your health care provider. Make sure you discuss any questions you have with your health care provider. Document  Released: 05/10/2015 Document Revised: 08/03/2017 Document Reviewed: 08/03/2017 Elsevier Interactive Patient Education  2019 Durand K. Panosh M.D.

## 2018-06-07 ENCOUNTER — Ambulatory Visit: Payer: BLUE CROSS/BLUE SHIELD | Admitting: Internal Medicine

## 2018-06-07 ENCOUNTER — Other Ambulatory Visit: Payer: Self-pay | Admitting: Interventional Cardiology

## 2018-06-07 ENCOUNTER — Encounter: Payer: Self-pay | Admitting: Internal Medicine

## 2018-06-07 VITALS — BP 120/78 | HR 92 | Temp 98.2°F | Wt 290.3 lb

## 2018-06-07 DIAGNOSIS — L309 Dermatitis, unspecified: Secondary | ICD-10-CM | POA: Diagnosis not present

## 2018-06-07 DIAGNOSIS — H9202 Otalgia, left ear: Secondary | ICD-10-CM

## 2018-06-07 DIAGNOSIS — H6690 Otitis media, unspecified, unspecified ear: Secondary | ICD-10-CM

## 2018-06-07 DIAGNOSIS — H6982 Other specified disorders of Eustachian tube, left ear: Secondary | ICD-10-CM

## 2018-06-07 MED ORDER — AMOXICILLIN-POT CLAVULANATE 875-125 MG PO TABS: 1.0000 | ORAL_TABLET | Freq: Two times a day (BID) | ORAL | 0 refills | Status: DC

## 2018-06-07 NOTE — Patient Instructions (Addendum)
Use topical antibiotic  On left external ear  May be eczema with  Bacterial infection on top of it  Looks much better than at initial.  Ear looks like some fluid  Behind so treating for a low grad ear infection.  I dont see any infection in th ear canalat this time . Popping is usually eustachian tube dysfunction  Advise fu with Dr [place]  If not better in 2 weeks or  Worse     Eustachian Tube Dysfunction  Eustachian tube dysfunction refers to a condition in which a blockage develops in the narrow passage that connects the middle ear to the back of the nose (eustachian tube). The eustachian tube regulates air pressure in the middle ear by letting air move between the ear and nose. It also helps to drain fluid from the middle ear space. Eustachian tube dysfunction can affect one or both ears. When the eustachian tube does not function properly, air pressure, fluid, or both can build up in the middle ear. What are the causes? This condition occurs when the eustachian tube becomes blocked or cannot open normally. Common causes of this condition include:  Ear infections.  Colds and other infections that affect the nose, mouth, and throat (upper respiratory tract).  Allergies.  Irritation from cigarette smoke.  Irritation from stomach acid coming up into the esophagus (gastroesophageal reflux). The esophagus is the tube that carries food from the mouth to the stomach.  Sudden changes in air pressure, such as from descending in an airplane or scuba diving.  Abnormal growths in the nose or throat, such as: ? Growths that line the nose (nasal polyps). ? Abnormal growth of cells (tumors). ? Enlarged tissue at the back of the throat (adenoids). What increases the risk? You are more likely to develop this condition if:  You smoke.  You are overweight.  You are a child who has: ? Certain birth defects of the mouth, such as cleft palate. ? Large tonsils or adenoids. What are the signs or  symptoms? Common symptoms of this condition include:  A feeling of fullness in the ear.  Ear pain.  Clicking or popping noises in the ear.  Ringing in the ear.  Hearing loss.  Loss of balance.  Dizziness. Symptoms may get worse when the air pressure around you changes, such as when you travel to an area of high elevation, fly on an airplane, or go scuba diving. How is this diagnosed? This condition may be diagnosed based on:  Your symptoms.  A physical exam of your ears, nose, and throat.  Tests, such as those that measure: ? The movement of your eardrum (tympanogram). ? Your hearing (audiometry). How is this treated? Treatment depends on the cause and severity of your condition.  In mild cases, you may relieve your symptoms by moving air into your ears. This is called "popping the ears."  In more severe cases, or if you have symptoms of fluid in your ears, treatment may include: ? Medicines to relieve congestion (decongestants). ? Medicines that treat allergies (antihistamines). ? Nasal sprays or ear drops that contain medicines that reduce swelling (steroids). ? A procedure to drain the fluid in your eardrum (myringotomy). In this procedure, a small tube is placed in the eardrum to:  Drain the fluid.  Restore the air in the middle ear space. ? A procedure to insert a balloon device through the nose to inflate the opening of the eustachian tube (balloon dilation). Follow these instructions at home: Lifestyle  Do not do any of the following until your health care provider approves: ? Travel to high altitudes. ? Fly in airplanes. ? Work in a Estate agent or room. ? Scuba dive.  Do not use any products that contain nicotine or tobacco, such as cigarettes and e-cigarettes. If you need help quitting, ask your health care provider.  Keep your ears dry. Wear fitted earplugs during showering and bathing. Dry your ears completely after. General instructions  Take  over-the-counter and prescription medicines only as told by your health care provider.  Use techniques to help pop your ears as recommended by your health care provider. These may include: ? Chewing gum. ? Yawning. ? Frequent, forceful swallowing. ? Closing your mouth, holding your nose closed, and gently blowing as if you are trying to blow air out of your nose.  Keep all follow-up visits as told by your health care provider. This is important. Contact a health care provider if:  Your symptoms do not go away after treatment.  Your symptoms come back after treatment.  You are unable to pop your ears.  You have: ? A fever. ? Pain in your ear. ? Pain in your head or neck. ? Fluid draining from your ear.  Your hearing suddenly changes.  You become very dizzy.  You lose your balance. Summary  Eustachian tube dysfunction refers to a condition in which a blockage develops in the eustachian tube.  It can be caused by ear infections, allergies, inhaled irritants, or abnormal growths in the nose or throat.  Symptoms include ear pain, hearing loss, or ringing in the ears.  Mild cases are treated with maneuvers to unblock the ears, such as yawning or ear popping.  Severe cases are treated with medicines. Surgery may also be done (rare). This information is not intended to replace advice given to you by your health care provider. Make sure you discuss any questions you have with your health care provider. Document Released: 05/10/2015 Document Revised: 08/03/2017 Document Reviewed: 08/03/2017 Elsevier Interactive Patient Education  2019 [employer].

## 2018-06-10 DIAGNOSIS — E1165 Type 2 diabetes mellitus with hyperglycemia: Secondary | ICD-10-CM | POA: Diagnosis not present

## 2018-06-11 ENCOUNTER — Other Ambulatory Visit: Payer: Self-pay | Admitting: Physician Assistant

## 2018-06-15 ENCOUNTER — Other Ambulatory Visit: Payer: Self-pay | Admitting: Physician Assistant

## 2018-06-15 MED ORDER — METOPROLOL SUCCINATE ER 50 MG PO TB24: ORAL_TABLET | ORAL | 3 refills | Status: DC

## 2018-07-05 ENCOUNTER — Encounter: Payer: Self-pay | Admitting: Family Medicine

## 2018-07-07 ENCOUNTER — Other Ambulatory Visit: Payer: Self-pay | Admitting: Physician Assistant

## 2018-07-18 ENCOUNTER — Other Ambulatory Visit: Payer: Self-pay | Admitting: Family Medicine

## 2018-07-18 DIAGNOSIS — E1165 Type 2 diabetes mellitus with hyperglycemia: Secondary | ICD-10-CM

## 2018-07-18 DIAGNOSIS — IMO0002 Reserved for concepts with insufficient information to code with codable children: Secondary | ICD-10-CM

## 2018-07-18 DIAGNOSIS — Z794 Long term (current) use of insulin: Principal | ICD-10-CM

## 2018-08-13 ENCOUNTER — Other Ambulatory Visit: Payer: Self-pay | Admitting: Family Medicine

## 2018-08-13 DIAGNOSIS — IMO0002 Reserved for concepts with insufficient information to code with codable children: Secondary | ICD-10-CM

## 2018-08-13 DIAGNOSIS — E1165 Type 2 diabetes mellitus with hyperglycemia: Secondary | ICD-10-CM

## 2018-08-13 DIAGNOSIS — Z794 Long term (current) use of insulin: Principal | ICD-10-CM

## 2018-08-16 MED ORDER — INSULIN ASPART 100 UNIT/ML FLEXPEN: PEN_INJECTOR | SUBCUTANEOUS | 1 refills | Status: AC

## 2018-08-16 NOTE — Addendum Note (Signed)
Addended by: Jobe Gibbon on: 08/16/2018 03:35 PM   Modules accepted: Orders

## 2018-08-25 ENCOUNTER — Telehealth: Payer: Self-pay | Admitting: Obstetrics and Gynecology

## 2018-08-25 NOTE — Telephone Encounter (Signed)
Patient has a severe yeast infection and would like an appointment today if possible.

## 2018-08-25 NOTE — Telephone Encounter (Signed)
Spoke with patient. Patient states that she is having severe vaginal itching. Denies any vaginal discharge. Reports she feels she has a yeast infected. Used Monistat 3 over the weekend without relief. Advised will need to be seen for further evaluation. Patient declines OV today due to work schedule. Appointment scheduled for 08/26/2018 at 3 pm with Dr.Jertson. Aware she may use OTC Hydrocortisone ointment externally for itching as needed until appointment. Patient is agreeable to date and time.  Routing to provider and will close encounter.

## 2018-08-26 ENCOUNTER — Other Ambulatory Visit: Payer: Self-pay

## 2018-08-26 ENCOUNTER — Ambulatory Visit (INDEPENDENT_AMBULATORY_CARE_PROVIDER_SITE_OTHER): Payer: BLUE CROSS/BLUE SHIELD | Admitting: Obstetrics and Gynecology

## 2018-08-26 ENCOUNTER — Encounter: Payer: Self-pay | Admitting: Obstetrics and Gynecology

## 2018-08-26 VITALS — BP 110/78 | HR 68 | Temp 98.1°F | Resp 16 | Wt 287.0 lb

## 2018-08-26 DIAGNOSIS — N760 Acute vaginitis: Secondary | ICD-10-CM | POA: Diagnosis not present

## 2018-08-26 MED ORDER — FLUCONAZOLE 150 MG PO TABS: 150.0000 mg | ORAL_TABLET | Freq: Once | ORAL | 0 refills | Status: AC

## 2018-08-26 MED ORDER — CLOBETASOL PROPIONATE 0.05 % EX OINT: TOPICAL_OINTMENT | CUTANEOUS | 0 refills | Status: DC

## 2018-08-26 NOTE — Patient Instructions (Signed)
Vaginitis  Vaginitis is a condition in which the vaginal tissue swells and becomes red (inflamed). This condition is most often caused by a change in the normal balance of bacteria and yeast that live in the vagina. This change causes an overgrowth of certain bacteria or yeast, which causes the inflammation. There are different types of vaginitis, but the most common types are:   Bacterial vaginosis.   Yeast infection (candidiasis).   Trichomoniasis vaginitis. This is a sexually transmitted disease (STD).   Viral vaginitis.   Atrophic vaginitis.   Allergic vaginitis.  What are the causes?  The cause of this condition depends on the type of vaginitis. It can be caused by:   Bacteria (bacterial vaginosis).   Yeast, which is a fungus (yeast infection).   A parasite (trichomoniasis vaginitis).   A virus (viral vaginitis).   Low hormone levels (atrophic vaginitis). Low hormone levels can occur during pregnancy, breastfeeding, or after menopause.   Irritants, such as bubble baths, scented tampons, and feminine sprays (allergic vaginitis).  Other factors can change the normal balance of the yeast and bacteria that live in the vagina. These include:   Antibiotic medicines.   Poor hygiene.   Diaphragms, vaginal sponges, spermicides, birth control pills, and intrauterine devices (IUD).   Sex.   Infection.   Uncontrolled diabetes.   A weakened defense (immune) system.  What increases the risk?  This condition is more likely to develop in women who:   Smoke.   Use vaginal douches, scented tampons, or scented sanitary pads.   Wear tight-fitting pants.   Wear thong underwear.   Use oral birth control pills or an IUD.   Have sex without a condom.   Have multiple sex partners.   Have an STD.   Frequently use the spermicide nonoxynol-9.   Eat lots of foods high in sugar.   Have uncontrolled diabetes.   Have low estrogen levels.   Have a weakened immune system from an immune disorder or medical  treatment.   Are pregnant or breastfeeding.  What are the signs or symptoms?  Symptoms vary depending on the cause of the vaginitis. Common symptoms include:   Abnormal vaginal discharge.  ? The discharge is white, gray, or yellow with bacterial vaginosis.  ? The discharge is thick, white, and cheesy with a yeast infection.  ? The discharge is frothy and yellow or greenish with trichomoniasis.   A bad vaginal smell. The smell is fishy with bacterial vaginosis.   Vaginal itching, pain, or swelling.   Sex that is painful.   Pain or burning when urinating.  Sometimes there are no symptoms.  How is this diagnosed?  This condition is diagnosed based on your symptoms and medical history. A physical exam, including a pelvic exam, will also be done. You may also have other tests, including:   Tests to determine the pH level (acidity or alkalinity) of your vagina.   A whiff test, to assess the odor that results when a sample of your vaginal discharge is mixed with a potassium hydroxide solution.   Tests of vaginal fluid. A sample will be examined under a microscope.  How is this treated?  Treatment varies depending on the type of vaginitis you have. Your treatment may include:   Antibiotic creams or pills to treat bacterial vaginosis and trichomoniasis.   Antifungal medicines, such as vaginal creams or suppositories, to treat a yeast infection.   Medicine to ease discomfort if you have viral vaginitis. Your sexual partner   should also be treated.   Estrogen delivered in a cream, pill, suppository, or vaginal ring to treat atrophic vaginitis. If vaginal dryness occurs, lubricants and moisturizing creams may help. You may need to avoid scented soaps, sprays, or douches.   Stopping use of a product that is causing allergic vaginitis. Then using a vaginal cream to treat the symptoms.  Follow these instructions at home:  Lifestyle   Keep your genital area clean and dry. Avoid soap, and only rinse the area with  water.   Do not douche or use tampons until your health care provider says it is okay to do so. Use sanitary pads, if needed.   Do not have sex until your health care provider approves. When you can return to sex, practice safe sex and use condoms.   Wipe from front to back. This avoids the spread of bacteria from the rectum to the vagina.  General instructions   Take over-the-counter and prescription medicines only as told by your health care provider.   If you were prescribed an antibiotic medicine, take or use it as told by your health care provider. Do not stop taking or using the antibiotic even if you start to feel better.   Keep all follow-up visits as told by your health care provider. This is important.  How is this prevented?   Use mild, non-scented products. Do not use things that can irritate the vagina, such as fabric softeners. Avoid the following products if they are scented:  ? Feminine sprays.  ? Detergents.  ? Tampons.  ? Feminine hygiene products.  ? Soaps or bubble baths.   Let air reach your genital area.  ? Wear cotton underwear to reduce moisture buildup.  ? Avoid wearing underwear while you sleep.  ? Avoid wearing tight pants and underwear or nylons without a cotton panel.  ? Avoid wearing thong underwear.   Take off any wet clothing, such as bathing suits, as soon as possible.   Practice safe sex and use condoms.  Contact a health care provider if:   You have abdominal pain.   You have a fever.   You have symptoms that last for more than 2-3 days.  Get help right away if:   You have a fever and your symptoms suddenly get worse.  Summary   Vaginitis is a condition in which the vaginal tissue becomes inflamed.This condition is most often caused by a change in the normal balance of bacteria and yeast that live in the vagina.   Treatment varies depending on the type of vaginitis you have.   Do not douche, use tampons , or have sex until your health care provider approves. When  you can return to sex, practice safe sex and use condoms.  This information is not intended to replace advice given to you by your health care provider. Make sure you discuss any questions you have with your health care provider.  Document Released: 02/08/2007 Document Revised: 05/19/2016 Document Reviewed: 05/19/2016  Elsevier Interactive Patient Education  2019 Elsevier Inc[employer]

## 2018-08-26 NOTE — Progress Notes (Signed)
GYNECOLOGY  VISIT   HPI: 53 y.o.   Legally Separated Black or African American Not Hispanic or Latino  female   [phone] with No LMP recorded. (Menstrual status: IUD).   here for vaginitis.    She c/o severe vulvar and vaginal itching and irritation for the last 1.5 weeks. She used monistat 3 day, last used it 6 days ago. No abnormal d/c.   She has DM, HgbA1C in 11/19 was 8.5. She was seen by an Endocrinologist in March, she was started on a new medication (one time a week), last used it a week ago. The new medication doesn't agree with her, feels forgetful   GYNECOLOGIC HISTORY: No LMP recorded. (Menstrual status: IUD). Contraception: IUD, mirena placed in 8/18 Menopausal hormone therapy: none        OB History    Gravida  3   Para  2   Term      Preterm      AB  1   Living  2     SAB  1   TAB      Ectopic      Multiple      Live Births  2              Patient Active Problem List   Diagnosis Date Noted  . Eczema 08/11/2017  . Unilateral primary osteoarthritis, left knee 11/05/2016  . Insulin dependent type 2 diabetes mellitus, uncontrolled ([place]) 12/19/2015  . Asthma, mild intermittent 12/19/2015  . Bilateral lower extremity edema 12/19/2015  . DJD (degenerative joint disease) of knee 06/29/2013  . Dysmenorrhea 03/30/2013  . Dyspnea 06/11/2010  . Morbid obesity ([place]) 12/21/2008  . Palpitations 12/21/2008  . GENITAL HERPES 03/22/2007  . Hyperlipidemia associated with type 2 diabetes mellitus ([place]) 03/22/2007  . Iron deficiency anemia 03/22/2007  . Anxiety disorder 03/22/2007  . DEPRESSION 03/22/2007  . Essential hypertension 03/22/2007  . ALLERGIC RHINITIS 03/22/2007  . [place] DISEASE, CERVICAL 03/22/2007  . POSITIVE PPD 03/22/2007    Past Medical History:  Diagnosis Date  . Allergic rhinitis   . Anemia   . Anxiety   . Arthritis   . Asthma, mild intermittent 12/19/2015  . Complication of anesthesia    difficulty waking up from anesthesia  .  Depression   . Diabetes mellitus type 2 in obese ([place])   . Echocardiogram shows left ventricular diastolic dysfunction   . Fluid retention   . Hyperlipidemia   . Hypertension   . Morbid obesity ([place])   . Pneumonia    history of  . Sleep apnea    mild sleep, no CPAP needed at this time    Past Surgical History:  Procedure Laterality Date  . ABDOMINAL SURGERY     chronic abd wall abscess  . CESAREAN SECTION     x's 2  . CHOLECYSTECTOMY    . DILATATION & CURETTAGE/HYSTEROSCOPY WITH MYOSURE N/A 12/08/2016   Procedure: DILATATION & CURETTAGE/HYSTEROSCOPY WITH MYOSURE;  Surgeon: Salvadore Dom, MD;  Location: [facility];  Service: Gynecology;  Laterality: N/A;  . INTRAUTERINE DEVICE (IUD) INSERTION N/A 12/08/2016   Procedure: MIRENA INTRAUTERINE DEVICE (IUD) INSERTION (IUD FROM OFFICE);  Surgeon: Salvadore Dom, MD;  Location: [facility];  Service: Gynecology;  Laterality: N/A;  . KNEE ARTHROSCOPY Right   . LIPOMA EXCISION  08/03/2011   Procedure: EXCISION LIPOMA;  Surgeon: Harl Bowie, MD;  Location: [facility];  Service: General;  Laterality: Right;  wide excision chronic abscess Right lower  abdominal wall  . OOPHORECTOMY     R ovary [facility] at [facility]      Current Outpatient Medications  Medication Sig Dispense Refill  . albuterol (PROVENTIL HFA;VENTOLIN HFA) 108 (90 Base) MCG/ACT inhaler Inhale 2 puffs into the lungs every 6 (six) hours as needed for wheezing or shortness of breath. 8.5 g 2  . atorvastatin (LIPITOR) 40 MG tablet TAKE 1 TABLET BY MOUTH EVERY DAY 90 tablet 2  . BAYER MICROLET LANCETS lancets Use as instructed to check sugar up to 6 times daily 600 each 5  . betamethasone valerate ointment (VALISONE) 0.1 % Apply 1 application topically daily as needed. ECZEMA  0  . Blood Glucose Monitoring Suppl (BAYER CONTOUR MONITOR) w/Device KIT Use to check sugar daily 1 each 0  . citalopram (CELEXA) 40 MG tablet TAKE 1 TABLET BY MOUTH EVERY DAY 90  tablet 2  . clobetasol cream (TEMOVATE) 0.14 % Apply 1 application topically as needed (infection or skin irritation).    . furosemide (LASIX) 20 MG tablet TAKE 1 TABLET BY MOUTH EVERY DAY 90 tablet 3  . glucose blood (BAYER CONTOUR TEST) test strip Use as instructed to check sugar 6 times daily 600 each 5  . hydroquinone 4 % cream Apply 1 application topically 2 (two) times daily as needed (skin discoloration).    Marland Kitchen ibuprofen (ADVIL,MOTRIN) 600 MG tablet Take 600 mg by mouth every 6 (six) hours as needed for moderate pain.    Marland Kitchen insulin aspart (NOVOLOG FLEXPEN) 100 UNIT/ML FlexPen INJECT 25-30 UNITS 3 TIMES A DAY *15 MINUTES BEFORE MEALS* 24 mL 1  . Insulin Pen Needle 31G X 5 MM MISC Use 4x a day 400 each 3  . INVOKANA 300 MG TABS tablet TAKE 1 TABLET BY MOUTH EVERY DAY 90 tablet 3  . metFORMIN (GLUCOPHAGE-XR) 500 MG 24 hr tablet TAKE 2 TABLETS BY MOUTH TWICE A DAY 360 tablet 0  . metoprolol succinate (TOPROL-XL) 25 MG 24 hr tablet TAKE 1 TABLET BY MOUTH DAILY. TAKE WITH 50 MG FOR TOTAL OF 75 MG. 90 tablet 3  . metoprolol succinate (TOPROL-XL) 50 MG 24 hr tablet Take 1 tablet by mouth daily with (25 mg) total 75 mg.Take with or immediately following a meal. 90 tablet 3  . Multiple Vitamins-Minerals (MULTIVITAMIN WITH MINERALS) tablet Take 1 tablet by mouth daily.    Marland Kitchen spironolactone (ALDACTONE) 25 MG tablet TAKE 2 TABLETS BY MOUTH EVERY DAY 180 tablet 1  . TOUJEO SOLOSTAR 300 UNIT/ML SOPN INJECT 75 UNITS INTO THE SKIN DAILY. 22.5 pen 2   No current facility-administered medications for this visit.      ALLERGIES: Ramipril  Family History  Problem Relation Age of Onset  . Diabetes Mother   . Hypertension Mother   . Other Mother        renal failure  . Diabetes Other   . Hypertension Father   . Cancer Father     Social History   Socioeconomic History  . Marital status: Legally Separated    Spouse name: Not on file  . Number of children: Not on file  . Years of education: Not on  file  . Highest education level: Not on file  Occupational History  . Not on file  Social Needs  . Financial resource strain: Not on file  . Food insecurity:    Worry: Not on file    Inability: Not on file  . Transportation needs:    Medical: Not on file  Non-medical: Not on file  Tobacco Use  . Smoking status: Never Smoker  . Smokeless tobacco: Never Used  Substance and Sexual Activity  . Alcohol use: Not Currently  . Drug use: No  . Sexual activity: Not Currently    Partners: Male    Birth control/protection: I.U.D.  Lifestyle  . Physical activity:    Days per week: Not on file    Minutes per session: Not on file  . Stress: Not on file  Relationships  . Social connections:    Talks on phone: Not on file    Gets together: Not on file    Attends religious service: Not on file    Active member of club or organization: Not on file    Attends meetings of clubs or organizations: Not on file    Relationship status: Not on file  . Intimate partner violence:    Fear of current or ex partner: Not on file    Emotionally abused: Not on file    Physically abused: Not on file    Forced sexual activity: Not on file  Other Topics Concern  . Not on file  Social History Narrative  . Not on file    Review of Systems  Constitutional: Negative.   HENT: Negative.   Eyes: Negative.   Respiratory: Negative.   Cardiovascular: Negative.   Gastrointestinal: Negative.   Genitourinary: Negative.   Musculoskeletal: Negative.   Skin:       Vaginal itching & irritation  Neurological: Negative.   Endo/Heme/Allergies: Negative.   Psychiatric/Behavioral: Negative.     PHYSICAL EXAMINATION:    BP 110/78   Pulse 68   Temp 98.1 F (36.7 C) (Oral)   Resp 16   Wt 287 lb (130.2 kg)   BMI 47.76 kg/m     General appearance: alert, cooperative and appears stated age  Pelvic: External genitalia:  no lesions, marked erythema/whitening, excoriated.               Urethra:  normal  appearing urethra with no masses, tenderness or lesions              Bartholins and Skenes: normal                 Vagina: normal appearing vagina with normal color and discharge, no lesions              Cervix: no lesions and IUD string 4 cm   Perianal: erythema/whitening               Chaperone was present for exam.  Wet prep: ? clue, no trich, ++ wbc KOH: no yeast PH: 5   ASSESSMENT Severe vulvovaginitis, treated with 3 day monistat. Suspect partially treated yeast (not seen on slides) Patient with uncontrolled DM    PLAN Nus[place]ab vaginitis panel Will treat with one diflucan now Clobetasol ointment for severe vulvlitis   An After Visit Summary was printed and given to the patient.  ~15 minutes face to face time of which over 50% was spent in counseling.

## 2018-09-03 LAB — NUSWAB VAGINITIS (VG)
Candida albicans, NAA: NEGATIVE
Candida glabrata, NAA: NEGATIVE
Trich vag by NAA: NEGATIVE

## 2018-10-07 ENCOUNTER — Other Ambulatory Visit: Payer: Self-pay | Admitting: Family Medicine

## 2018-10-07 DIAGNOSIS — E1165 Type 2 diabetes mellitus with hyperglycemia: Secondary | ICD-10-CM

## 2018-10-07 DIAGNOSIS — IMO0002 Reserved for concepts with insufficient information to code with codable children: Secondary | ICD-10-CM

## 2018-10-17 DIAGNOSIS — N760 Acute vaginitis: Secondary | ICD-10-CM | POA: Diagnosis not present

## 2018-10-17 DIAGNOSIS — N3090 Cystitis, unspecified without hematuria: Secondary | ICD-10-CM | POA: Diagnosis not present

## 2018-10-17 DIAGNOSIS — R350 Frequency of micturition: Secondary | ICD-10-CM | POA: Diagnosis not present

## 2018-10-24 NOTE — Progress Notes (Deleted)
53 y.o. [id number] Legally Separated Black or African American Not Hispanic or Latino female here for annual exam.      No LMP recorded. (Menstrual status: IUD).          Sexually active: {yes no:314532}  The current method of family planning is {contraception:315051}.    Exercising: {yes no:314532}  {types:19826} Smoker:  {YES NO:22349}  Health Maintenance: Pap:  03/2016 WNL per patient  History of abnormal Pap:  Yes - had cryosurgery  MMG:  2015 WNL per patient  Colonoscopy:  Never BMD:   Never TDaP:  unsure Gardasil: N/A   reports that she has never smoked. She has never used smokeless tobacco. She reports previous alcohol use. She reports that she does not use drugs.  Past Medical History:  Diagnosis Date  . Allergic rhinitis   . Anemia   . Anxiety   . Arthritis   . Asthma, mild intermittent 12/19/2015  . Complication of anesthesia    difficulty waking up from anesthesia  . Depression   . Diabetes mellitus type 2 in obese ([place])   . Echocardiogram shows left ventricular diastolic dysfunction   . Fluid retention   . Hyperlipidemia   . Hypertension   . Morbid obesity ([place])   . Pneumonia    history of  . Sleep apnea    mild sleep, no CPAP needed at this time    Past Surgical History:  Procedure Laterality Date  . ABDOMINAL SURGERY     chronic abd wall abscess  . CESAREAN SECTION     x's 2  . CHOLECYSTECTOMY    . DILATATION & CURETTAGE/HYSTEROSCOPY WITH MYOSURE N/A 12/08/2016   Procedure: DILATATION & CURETTAGE/HYSTEROSCOPY WITH MYOSURE;  Surgeon: Salvadore Dom, MD;  Location: [facility];  Service: Gynecology;  Laterality: N/A;  . INTRAUTERINE DEVICE (IUD) INSERTION N/A 12/08/2016   Procedure: MIRENA INTRAUTERINE DEVICE (IUD) INSERTION (IUD FROM OFFICE);  Surgeon: Salvadore Dom, MD;  Location: [facility];  Service: Gynecology;  Laterality: N/A;  . KNEE ARTHROSCOPY Right   . LIPOMA EXCISION  08/03/2011   Procedure: EXCISION LIPOMA;  Surgeon: Harl Bowie, MD;   Location: [facility];  Service: General;  Laterality: Right;  wide excision chronic abscess Right lower abdominal wall  . OOPHORECTOMY     R ovary removed at [facility]      Current Outpatient Medications  Medication Sig Dispense Refill  . albuterol (PROVENTIL HFA;VENTOLIN HFA) 108 (90 Base) MCG/ACT inhaler Inhale 2 puffs into the lungs every 6 (six) hours as needed for wheezing or shortness of breath. 8.5 g 2  . atorvastatin (LIPITOR) 40 MG tablet TAKE 1 TABLET BY MOUTH EVERY DAY 90 tablet 2  . BAYER MICROLET LANCETS lancets Use as instructed to check sugar up to 6 times daily 600 each 5  . betamethasone valerate ointment (VALISONE) 0.1 % Apply 1 application topically daily as needed. ECZEMA  0  . Blood Glucose Monitoring Suppl (BAYER CONTOUR MONITOR) w/Device KIT Use to check sugar daily 1 each 0  . citalopram (CELEXA) 40 MG tablet TAKE 1 TABLET BY MOUTH EVERY DAY 90 tablet 2  . clobetasol cream (TEMOVATE) 1.94 % Apply 1 application topically as needed (infection or skin irritation).    . clobetasol ointment (TEMOVATE) 0.05 % Apply as directed twice daily for up to 2 weeks. Not for daily long term use 30 g 0  . furosemide (LASIX) 20 MG tablet TAKE 1 TABLET BY MOUTH EVERY DAY 90 tablet 3  .  glucose blood (BAYER CONTOUR TEST) test strip Use as instructed to check sugar 6 times daily 600 each 5  . hydroquinone 4 % cream Apply 1 application topically 2 (two) times daily as needed (skin discoloration).    Marland Kitchen ibuprofen (ADVIL,MOTRIN) 600 MG tablet Take 600 mg by mouth every 6 (six) hours as needed for moderate pain.    Marland Kitchen insulin aspart (NOVOLOG FLEXPEN) 100 UNIT/ML FlexPen INJECT 25-30 UNITS 3 TIMES A DAY *15 MINUTES BEFORE MEALS* 24 mL 1  . Insulin Pen Needle 31G X 5 MM MISC Use 4x a day 400 each 3  . INVOKANA 300 MG TABS tablet TAKE 1 TABLET BY MOUTH EVERY DAY 90 tablet 3  . metFORMIN (GLUCOPHAGE-XR) 500 MG 24 hr tablet TAKE 2 TABLETS BY MOUTH TWICE A DAY 360 tablet 0  .  metoprolol succinate (TOPROL-XL) 25 MG 24 hr tablet TAKE 1 TABLET BY MOUTH DAILY. TAKE WITH 50 MG FOR TOTAL OF 75 MG. 90 tablet 3  . metoprolol succinate (TOPROL-XL) 50 MG 24 hr tablet Take 1 tablet by mouth daily with (25 mg) total 75 mg.Take with or immediately following a meal. 90 tablet 3  . Multiple Vitamins-Minerals (MULTIVITAMIN WITH MINERALS) tablet Take 1 tablet by mouth daily.    Marland Kitchen spironolactone (ALDACTONE) 25 MG tablet TAKE 2 TABLETS BY MOUTH EVERY DAY 180 tablet 1  . TOUJEO SOLOSTAR 300 UNIT/ML SOPN INJECT 75 UNITS INTO THE SKIN DAILY. 22.5 pen 2   No current facility-administered medications for this visit.     Family History  Problem Relation Age of Onset  . Diabetes Mother   . Hypertension Mother   . Other Mother        renal failure  . Diabetes Other   . Hypertension Father   . Cancer Father     Review of Systems  Exam:   There were no vitals taken for this visit.  Weight change: _0 @ Height:      Ht Readings from Last 3 Encounters:  03/11/18 _1  (1.651 m)  03/03/18 _2  (1.651 m)  08/11/17 _3  (1.651 m)    General appearance: alert, cooperative and appears stated age Head: Normocephalic, without obvious abnormality, atraumatic Neck: no adenopathy, supple, symmetrical, trachea midline and thyroid {CHL AMB PHY EX THYROID NORM DEFAULT:[phone]::"normal to inspection and palpation"} Lungs: clear to auscultation bilaterally Cardiovascular: regular rate and rhythm Breasts: {Exam; breast:13139::"normal appearance, no masses or tenderness"} Abdomen: soft, non-tender; non distended,  no masses,  no organomegaly Extremities: extremities normal, atraumatic, no cyanosis or edema Skin: Skin color, texture, turgor normal. No rashes or lesions Lymph nodes: Cervical, supraclavicular, and axillary nodes normal. No abnormal inguinal nodes palpated Neurologic: Grossly normal   Pelvic: External genitalia:  no lesions              Urethra:  normal appearing  urethra with no masses, tenderness or lesions              Bartholins and Skenes: normal                 Vagina: normal appearing vagina with normal color and discharge, no lesions              Cervix: {CHL AMB PHY EX CERVIX NORM [phone]::"no lesions"}               Bimanual Exam:  Uterus:  {CHL AMB PHY EX UTERUS NORM DEFAULT:[phone]::"normal size, contour, position, consistency, mobility, non-tender"}  Adnexa: {CHL AMB PHY EX ADNEXA NO MASS DEFAULT:[phone]::"no mass, fullness, tenderness"}               Rectovaginal: Confirms               Anus:  normal sphincter tone, no lesions  Chaperone was present for exam.  A:  Well Woman with normal exam  P:

## 2018-10-26 ENCOUNTER — Ambulatory Visit: Payer: BC Managed Care – PPO | Admitting: Obstetrics and Gynecology

## 2018-10-26 ENCOUNTER — Encounter: Payer: Self-pay | Admitting: Obstetrics and Gynecology

## 2018-11-09 ENCOUNTER — Other Ambulatory Visit: Payer: Self-pay | Admitting: Family Medicine

## 2018-11-09 DIAGNOSIS — E1165 Type 2 diabetes mellitus with hyperglycemia: Secondary | ICD-10-CM

## 2018-11-09 DIAGNOSIS — IMO0002 Reserved for concepts with insufficient information to code with codable children: Secondary | ICD-10-CM

## 2018-11-14 ENCOUNTER — Ambulatory Visit (INDEPENDENT_AMBULATORY_CARE_PROVIDER_SITE_OTHER): Payer: Self-pay | Admitting: Family Medicine

## 2018-11-14 ENCOUNTER — Encounter: Payer: Self-pay | Admitting: Family Medicine

## 2018-11-14 ENCOUNTER — Other Ambulatory Visit: Payer: Self-pay

## 2018-11-14 VITALS — BP 130/64 | HR 87 | Temp 98.1°F | Wt 283.8 lb

## 2018-11-14 DIAGNOSIS — M542 Cervicalgia: Secondary | ICD-10-CM

## 2018-11-14 MED ORDER — CYCLOBENZAPRINE HCL 10 MG PO TABS: 10.0000 mg | ORAL_TABLET | Freq: Three times a day (TID) | ORAL | 0 refills | Status: DC | PRN

## 2018-11-14 MED ORDER — DICLOFENAC SODIUM 75 MG PO TBEC: 75.0000 mg | DELAYED_RELEASE_TABLET | Freq: Two times a day (BID) | ORAL | 0 refills | Status: DC

## 2018-11-14 NOTE — Progress Notes (Signed)
   Subjective:    Patient ID: JENNEFER KOPP, female    DOB: [birth year], 53 y.o.   MRN: [id number]  HPI Here for 3 days of pain and stiffness in the neck and upper back. No recent trauma, she woke up with this one morning. She has tried ice, heat, BioFreeze, and Ibuprofen with no benefit. No pain or numbness in the arms.    Review of Systems  Constitutional: Negative.   Respiratory: Negative.   Cardiovascular: Negative.   Musculoskeletal: Positive for neck pain and neck stiffness.       Objective:   Physical Exam Constitutional:      Comments: In pain   Cardiovascular:     Rate and Rhythm: Normal rate and regular rhythm.     Pulses: Normal pulses.     Heart sounds: Normal heart sounds.  Pulmonary:     Effort: Pulmonary effort is normal.     Breath sounds: Normal breath sounds.  Musculoskeletal:     Comments: She is very tender in the posterior neck and over both upper trapezius muscles. ROM of the neck is very limited by pain   Neurological:     Mental Status: She is alert.           Assessment & Plan:  Neck spasms. She will use heat, Diclofenac, and Flexeril. Recheck prn.  Alysia Penna, MD

## 2018-11-29 ENCOUNTER — Other Ambulatory Visit: Payer: Self-pay

## 2018-11-29 DIAGNOSIS — Z20822 Contact with and (suspected) exposure to covid-19: Secondary | ICD-10-CM

## 2018-12-01 LAB — NOVEL CORONAVIRUS, NAA: SARS-CoV-2, NAA: NOT DETECTED

## 2018-12-04 ENCOUNTER — Other Ambulatory Visit: Payer: Self-pay | Admitting: Interventional Cardiology

## 2018-12-04 DIAGNOSIS — E1165 Type 2 diabetes mellitus with hyperglycemia: Secondary | ICD-10-CM

## 2018-12-04 DIAGNOSIS — IMO0002 Reserved for concepts with insufficient information to code with codable children: Secondary | ICD-10-CM

## 2018-12-06 NOTE — Telephone Encounter (Signed)
Needs to get from PCP

## 2018-12-07 DIAGNOSIS — Z794 Long term (current) use of insulin: Secondary | ICD-10-CM | POA: Diagnosis not present

## 2018-12-07 DIAGNOSIS — E785 Hyperlipidemia, unspecified: Secondary | ICD-10-CM | POA: Diagnosis not present

## 2018-12-07 DIAGNOSIS — I10 Essential (primary) hypertension: Secondary | ICD-10-CM | POA: Diagnosis not present

## 2018-12-07 DIAGNOSIS — E1165 Type 2 diabetes mellitus with hyperglycemia: Secondary | ICD-10-CM | POA: Diagnosis not present

## 2018-12-28 DIAGNOSIS — R300 Dysuria: Secondary | ICD-10-CM | POA: Diagnosis not present

## 2018-12-28 DIAGNOSIS — F418 Other specified anxiety disorders: Secondary | ICD-10-CM | POA: Diagnosis not present

## 2018-12-28 DIAGNOSIS — R350 Frequency of micturition: Secondary | ICD-10-CM | POA: Diagnosis not present

## 2018-12-28 DIAGNOSIS — N898 Other specified noninflammatory disorders of vagina: Secondary | ICD-10-CM | POA: Diagnosis not present

## 2019-01-09 ENCOUNTER — Other Ambulatory Visit: Payer: Self-pay | Admitting: Physician Assistant

## 2019-01-12 ENCOUNTER — Other Ambulatory Visit: Payer: Self-pay | Admitting: Family Medicine

## 2019-01-12 DIAGNOSIS — F418 Other specified anxiety disorders: Secondary | ICD-10-CM

## 2019-01-18 ENCOUNTER — Encounter: Payer: Self-pay | Admitting: Gynecology

## 2019-02-28 ENCOUNTER — Other Ambulatory Visit: Payer: Self-pay | Admitting: Family Medicine

## 2019-02-28 DIAGNOSIS — E785 Hyperlipidemia, unspecified: Secondary | ICD-10-CM

## 2019-02-28 DIAGNOSIS — E1169 Type 2 diabetes mellitus with other specified complication: Secondary | ICD-10-CM

## 2019-03-09 DIAGNOSIS — E785 Hyperlipidemia, unspecified: Secondary | ICD-10-CM | POA: Diagnosis not present

## 2019-03-09 DIAGNOSIS — E1165 Type 2 diabetes mellitus with hyperglycemia: Secondary | ICD-10-CM | POA: Diagnosis not present

## 2019-03-09 DIAGNOSIS — E119 Type 2 diabetes mellitus without complications: Secondary | ICD-10-CM | POA: Diagnosis not present

## 2019-03-09 DIAGNOSIS — Z794 Long term (current) use of insulin: Secondary | ICD-10-CM | POA: Diagnosis not present

## 2019-03-09 DIAGNOSIS — I10 Essential (primary) hypertension: Secondary | ICD-10-CM | POA: Diagnosis not present

## 2019-03-20 ENCOUNTER — Encounter: Payer: Self-pay | Admitting: Family Medicine

## 2019-03-20 ENCOUNTER — Ambulatory Visit (INDEPENDENT_AMBULATORY_CARE_PROVIDER_SITE_OTHER): Payer: BC Managed Care – PPO | Admitting: Family Medicine

## 2019-03-20 ENCOUNTER — Other Ambulatory Visit: Payer: Self-pay

## 2019-03-20 VITALS — BP 140/78 | HR 86 | Temp 97.5°F | Resp 16 | Ht 65.0 in | Wt 280.4 lb

## 2019-03-20 DIAGNOSIS — M549 Dorsalgia, unspecified: Secondary | ICD-10-CM | POA: Diagnosis not present

## 2019-03-20 DIAGNOSIS — I10 Essential (primary) hypertension: Secondary | ICD-10-CM

## 2019-03-20 DIAGNOSIS — R200 Anesthesia of skin: Secondary | ICD-10-CM | POA: Diagnosis not present

## 2019-03-20 DIAGNOSIS — M25512 Pain in left shoulder: Secondary | ICD-10-CM | POA: Diagnosis not present

## 2019-03-20 DIAGNOSIS — R202 Paresthesia of skin: Secondary | ICD-10-CM | POA: Diagnosis not present

## 2019-03-20 MED ORDER — METHYLPREDNISOLONE ACETATE 40 MG/ML IJ SUSP
40.0000 mg | Freq: Once | INTRAMUSCULAR | Status: AC
  Administered 2019-03-20: 40 mg via INTRAMUSCULAR

## 2019-03-20 MED ORDER — DICLOFENAC SODIUM 75 MG PO TBEC: 75.0000 mg | DELAYED_RELEASE_TABLET | Freq: Two times a day (BID) | ORAL | 0 refills | Status: DC

## 2019-03-20 MED ORDER — HYDROCODONE-ACETAMINOPHEN 5-325 MG PO TABS: 1.0000 | ORAL_TABLET | Freq: Three times a day (TID) | ORAL | 0 refills | Status: AC | PRN

## 2019-03-20 NOTE — Patient Instructions (Addendum)
A few things to remember from today's visit:   Acute pain of left shoulder  Numbness and tingling of left upper extremity  Upper back pain on left side Here in the office you received Depo-Medrol 40 mg IM.  Continue monitoring blood sugars.  For now I do not think imaging is needed. Physical therapy will be arranged. Continue local ice. If problem gets worse, will need to consider orthopedic evaluation. Chiropractor evaluation may also be beneficial.  Please be sure medication list is accurate. If a new problem present, please set up appointment sooner than planned today.
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
## 2019-03-22 ENCOUNTER — Ambulatory Visit: Payer: BC Managed Care – PPO | Admitting: Physician Assistant

## 2019-03-27 ENCOUNTER — Ambulatory Visit: Payer: BC Managed Care – PPO | Attending: Family Medicine | Admitting: Physical Therapy

## 2019-03-29 ENCOUNTER — Encounter: Payer: Self-pay | Admitting: Physician Assistant

## 2019-03-29 ENCOUNTER — Other Ambulatory Visit: Payer: Self-pay

## 2019-03-29 ENCOUNTER — Ambulatory Visit: Payer: BC Managed Care – PPO | Admitting: Physician Assistant

## 2019-03-29 ENCOUNTER — Ambulatory Visit: Payer: Self-pay

## 2019-03-29 DIAGNOSIS — M542 Cervicalgia: Secondary | ICD-10-CM

## 2019-03-29 DIAGNOSIS — M25512 Pain in left shoulder: Secondary | ICD-10-CM | POA: Diagnosis not present

## 2019-03-29 MED ORDER — DICLOFENAC SODIUM 75 MG PO TBEC: 75.0000 mg | DELAYED_RELEASE_TABLET | Freq: Two times a day (BID) | ORAL | 0 refills | Status: AC

## 2019-03-29 MED ORDER — CYCLOBENZAPRINE HCL 10 MG PO TABS: 10.0000 mg | ORAL_TABLET | Freq: Three times a day (TID) | ORAL | 0 refills | Status: DC | PRN

## 2019-03-29 NOTE — Progress Notes (Signed)
Office Visit Note   Patient: Heather Myers           Date of Birth: [birth year]           MRN: [id number] Visit Date: 03/29/2019              Requested by: Myers, [place] G[place] MD [address] [place],  [place] [address] PCP: Myers, Heather G, MD   Assessment & Plan: Visit Diagnoses:  1. Neck pain   2. Acute pain of left shoulder     Plan: Due to the fact the patient is failed conservative treatment recommend MRI of the cervical spine rule out HNP as source of her pain.  Have her follow-up after the MRI to go over results.  Did place her on Flexeril and she is given a refill on her diclofenac.  Spoke with her about taking other NSAIDs that she has been taking ibuprofen along with the diclofenac.  She understands now that she should not take any other NSAIDs while on diclofenac.  However I did suggest she pick up some Voltaren gel start applying 3 times daily to the left ring finger at the A1 pulley.  Questions were encouraged and answered at length.  She is given cervical spine exercises that she can start on her own at home.  Moist heat to the neck.  Did not refill her hydrocodone and she states this is not beneficial it only causes her to sleep.  Follow-Up Instructions: No follow-ups on file.   Orders:  Orders Placed This Encounter  Procedures  . XR Cervical Spine 2 or 3 views   Meds ordered this encounter  Medications  . cyclobenzaprine (FLEXERIL) 10 MG tablet    Sig: Take 1 tablet (10 mg total) by mouth 3 (three) times daily as needed for muscle spasms.    Dispense:  40 tablet    Refill:  0  . diclofenac (VOLTAREN) 75 MG EC tablet    Sig: Take 1 tablet (75 mg total) by mouth 2 (two) times daily for 10 days.    Dispense:  20 tablet    Refill:  0      Procedures: No procedures performed   Clinical Data: No additional findings.   Subjective: Chief Complaint  Patient presents with  . Neck - Pain    HPI Heather Myers 54 year old female comes in today with  neck pain and left shoulder pain.  She states she has had neck pain for about 3 weeks.  She felt she slept wrong.  She seen her primary care physician which she reports gave her a steroid injection intramuscular and placed her on diclofenac and hydrocodone.  She states initially she did well.  But the relief she experienced was only for a few days.  She is now having neck pain that radiates down her left arm.  She states she has pain down the left forearm dorsally into the hand but not the fingers.  Some numbness in her forearm at times.  She states pain radiates down into her left shoulder blade also.  She had no previous symptoms like this. Patient is diabetic last hemoglobin A1c was 8.5.  Review of Systems Negative for fevers chills shortness of breath and see HPI otherwise negative  Objective: Vital Signs: There were no vitals taken for this visit.  Physical Exam Constitutional:      Appearance: She is not ill-appearing or diaphoretic.  Pulmonary:     Effort: Pulmonary effort is normal.  Neurological:  Mental Status: She is alert and oriented to person, place, and time.  Psychiatric:        Mood and Affect: Mood normal.     Ortho Exam Cervical spine negative Spurling's.  She has tenderness over the lower cervical spine and in the left paraspinous region of the lower cervical spine.  Limited extension flexion cervical spine due to pain.  No pain with rotation left to right.  Tenderness along the medial border of left scapula.  5 out of 5 strength throughout the upper extremities against resistance.  Sensation grossly intact bilateral hands.  She has an early trigger finger involving the left ring finger and tenderness at the A1 pulley.  Full motor bilateral hands otherwise.  Radial pulses 2+ bilaterally. Specialty Comments:  No specialty comments available.  Imaging: Xr Cervical Spine 2 Or 3 Views  Result Date: 03/29/2019 Cervical spine AP lateral views: Osteophytes off the  anterior vertebral bodies C4-C6.  Disc space overall well-maintained.  Loss of lordotic curvature.  No spondylolisthesis.  No acute fractures.    PMFS History: Patient Active Problem List   Diagnosis Date Noted  . Eczema 08/11/2017  . Unilateral primary osteoarthritis, left knee 11/05/2016  . Insulin dependent type 2 diabetes mellitus, uncontrolled ([facility]) 12/19/2015  . Asthma, mild intermittent 12/19/2015  . Bilateral lower extremity edema 12/19/2015  . DJD (degenerative joint disease) of knee 06/29/2013  . Dysmenorrhea 03/30/2013  . Dyspnea 06/11/2010  . Morbid obesity ([facility]) 12/21/2008  . Palpitations 12/21/2008  . GENITAL HERPES 03/22/2007  . Hyperlipidemia associated with type 2 diabetes mellitus ([facility]) 03/22/2007  . Iron deficiency anemia 03/22/2007  . Anxiety disorder 03/22/2007  . DEPRESSION 03/22/2007  . Essential hypertension 03/22/2007  . ALLERGIC RHINITIS 03/22/2007  . DISC DISEASE, CERVICAL 03/22/2007  . 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 Allergic rhinitis   . Anemia   . Anxiety   . Arthritis   . Asthma, mild intermittent 12/19/2015  . Complication of anesthesia    difficulty waking up from anesthesia  . Depression   . Diabetes mellitus type 2 in obese ([facility])   . Echocardiogram shows left ventricular diastolic dysfunction   . Fluid retention   . Hyperlipidemia   . Hypertension   . Morbid obesity ([facility])   . Pneumonia    history of  . Sleep apnea    mild sleep, no CPAP needed at this time    Family History  Problem Relation Age of Onset  . Diabetes Mother   . Hypertension Mother   . Other Mother        renal failure  . Diabetes Other   . Hypertension Father   . Cancer Father     Past Surgical History:  Procedure Laterality Date  . ABDOMINAL SURGERY     chronic abd wall abscess  . CESAREAN SECTION     x's 2  . CHOLECYSTECTOMY    . DILATATION & CURETTAGE/HYSTEROSCOPY WITH MYOSURE N/A 12/08/2016   Procedure: DILATATION  & CURETTAGE/HYSTEROSCOPY WITH MYOSURE;  Surgeon: Romualdo Bolk, MD;  Location: [facility];  Service: Gynecology;  Laterality: N/A;  . INTRAUTERINE DEVICE (IUD) INSERTION N/A 12/08/2016   Procedure: MIRENA INTRAUTERINE DEVICE (IUD) INSERTION (IUD FROM OFFICE);  Surgeon: Romualdo Bolk, MD;  Location: [facility];  Service: Gynecology;  Laterality: N/A;  . KNEE ARTHROSCOPY Right   . LIPOMA EXCISION  08/03/2011   Procedure: EXCISION LIPOMA;  Surgeon: Shelly Rubenstein, MD;  Location: [facility];  Service: General;  Laterality: Right;  wide excision chronic abscess Right lower abdominal wall  . OOPHORECTOMY     R [facility]  . RSO     Social History   Occupational History  . Not on file  Tobacco Use  . Smoking status: Never Smoker  . Smokeless tobacco: Never Used  Substance and Sexual Activity  . Alcohol use: Not Currently  . Drug use: No  . Sexual activity: Not Currently    Partners: Male    Birth control/protection: I.U.D.

## 2019-03-30 ENCOUNTER — Other Ambulatory Visit: Payer: Self-pay | Admitting: Physician Assistant

## 2019-03-30 DIAGNOSIS — M25512 Pain in left shoulder: Secondary | ICD-10-CM

## 2019-03-30 DIAGNOSIS — M542 Cervicalgia: Secondary | ICD-10-CM

## 2019-04-03 ENCOUNTER — Telehealth: Payer: Self-pay | Admitting: Physician Assistant

## 2019-04-03 DIAGNOSIS — M9905 Segmental and somatic dysfunction of pelvic region: Secondary | ICD-10-CM | POA: Diagnosis not present

## 2019-04-03 DIAGNOSIS — M9903 Segmental and somatic dysfunction of lumbar region: Secondary | ICD-10-CM | POA: Diagnosis not present

## 2019-04-03 DIAGNOSIS — M9904 Segmental and somatic dysfunction of sacral region: Secondary | ICD-10-CM | POA: Diagnosis not present

## 2019-04-03 DIAGNOSIS — M9901 Segmental and somatic dysfunction of cervical region: Secondary | ICD-10-CM | POA: Diagnosis not present

## 2019-04-03 NOTE — Telephone Encounter (Signed)
Patient aware CD is ready for pickup at front desk.  

## 2019-04-03 NOTE — Telephone Encounter (Signed)
Patient called wanting xrays on CD. Please call when ready. [phone]

## 2019-04-04 DIAGNOSIS — M9903 Segmental and somatic dysfunction of lumbar region: Secondary | ICD-10-CM | POA: Diagnosis not present

## 2019-04-04 DIAGNOSIS — M9901 Segmental and somatic dysfunction of cervical region: Secondary | ICD-10-CM | POA: Diagnosis not present

## 2019-04-04 DIAGNOSIS — M9904 Segmental and somatic dysfunction of sacral region: Secondary | ICD-10-CM | POA: Diagnosis not present

## 2019-04-04 DIAGNOSIS — M9905 Segmental and somatic dysfunction of pelvic region: Secondary | ICD-10-CM | POA: Diagnosis not present

## 2019-04-06 DIAGNOSIS — M9903 Segmental and somatic dysfunction of lumbar region: Secondary | ICD-10-CM | POA: Diagnosis not present

## 2019-04-06 DIAGNOSIS — M9901 Segmental and somatic dysfunction of cervical region: Secondary | ICD-10-CM | POA: Diagnosis not present

## 2019-04-06 DIAGNOSIS — M9904 Segmental and somatic dysfunction of sacral region: Secondary | ICD-10-CM | POA: Diagnosis not present

## 2019-04-06 DIAGNOSIS — M9905 Segmental and somatic dysfunction of pelvic region: Secondary | ICD-10-CM | POA: Diagnosis not present

## 2019-04-11 ENCOUNTER — Other Ambulatory Visit: Payer: Self-pay | Admitting: Physician Assistant

## 2019-04-11 DIAGNOSIS — M9903 Segmental and somatic dysfunction of lumbar region: Secondary | ICD-10-CM | POA: Diagnosis not present

## 2019-04-11 DIAGNOSIS — M9901 Segmental and somatic dysfunction of cervical region: Secondary | ICD-10-CM | POA: Diagnosis not present

## 2019-04-11 DIAGNOSIS — M9904 Segmental and somatic dysfunction of sacral region: Secondary | ICD-10-CM | POA: Diagnosis not present

## 2019-04-11 DIAGNOSIS — M9905 Segmental and somatic dysfunction of pelvic region: Secondary | ICD-10-CM | POA: Diagnosis not present

## 2019-04-12 DIAGNOSIS — M9901 Segmental and somatic dysfunction of cervical region: Secondary | ICD-10-CM | POA: Diagnosis not present

## 2019-04-12 DIAGNOSIS — M9904 Segmental and somatic dysfunction of sacral region: Secondary | ICD-10-CM | POA: Diagnosis not present

## 2019-04-12 DIAGNOSIS — M9905 Segmental and somatic dysfunction of pelvic region: Secondary | ICD-10-CM | POA: Diagnosis not present

## 2019-04-12 DIAGNOSIS — M9903 Segmental and somatic dysfunction of lumbar region: Secondary | ICD-10-CM | POA: Diagnosis not present

## 2019-04-18 ENCOUNTER — Other Ambulatory Visit: Payer: Self-pay

## 2019-04-18 DIAGNOSIS — Z113 Encounter for screening for infections with a predominantly sexual mode of transmission: Secondary | ICD-10-CM | POA: Diagnosis not present

## 2019-04-18 DIAGNOSIS — N390 Urinary tract infection, site not specified: Secondary | ICD-10-CM | POA: Diagnosis not present

## 2019-04-19 ENCOUNTER — Ambulatory Visit: Payer: BC Managed Care – PPO | Admitting: Family Medicine

## 2019-04-24 DIAGNOSIS — Z01419 Encounter for gynecological examination (general) (routine) without abnormal findings: Secondary | ICD-10-CM | POA: Diagnosis not present

## 2019-04-24 DIAGNOSIS — R635 Abnormal weight gain: Secondary | ICD-10-CM | POA: Diagnosis not present

## 2019-04-24 DIAGNOSIS — Z3009 Encounter for other general counseling and advice on contraception: Secondary | ICD-10-CM | POA: Diagnosis not present

## 2019-04-24 DIAGNOSIS — Z862 Personal history of diseases of the blood and blood-forming organs and certain disorders involving the immune mechanism: Secondary | ICD-10-CM | POA: Diagnosis not present

## 2019-04-27 ENCOUNTER — Telehealth: Payer: Self-pay | Admitting: Orthopaedic Surgery

## 2019-04-27 NOTE — Telephone Encounter (Signed)
A representative from [facility] called. The patient was sent there for an MRI but she was unable to fit into their machines. They suggested sending her to [facility] or [facility].

## 2019-05-01 NOTE — Telephone Encounter (Signed)
Pt has alread been taken care of she is scheduled at [facility] imaging on 05/04/19

## 2019-05-04 ENCOUNTER — Other Ambulatory Visit: Payer: BC Managed Care – PPO

## 2019-05-08 ENCOUNTER — Ambulatory Visit: Payer: BC Managed Care – PPO | Admitting: Physician Assistant

## 2019-05-08 ENCOUNTER — Telehealth: Payer: Self-pay | Admitting: Radiology

## 2019-05-08 NOTE — Telephone Encounter (Signed)
I called patient to see if MRI has been completed prior to appt today.  If not, she will need to reschedule for after MRI. Mailbox is full and not accepting new messages. Please relay message to patient if she returns call.

## 2019-05-20 ENCOUNTER — Other Ambulatory Visit: Payer: Self-pay

## 2019-05-20 ENCOUNTER — Ambulatory Visit
Admission: RE | Admit: 2019-05-20 | Discharge: 2019-05-20 | Disposition: A | Discharge status: Home / Self Care | Payer: BC Managed Care – PPO | Source: Ambulatory Visit | Attending: Physician Assistant | Admitting: Physician Assistant

## 2019-05-20 DIAGNOSIS — M542 Cervicalgia: Secondary | ICD-10-CM

## 2019-05-20 DIAGNOSIS — M25512 Pain in left shoulder: Secondary | ICD-10-CM

## 2019-05-30 ENCOUNTER — Other Ambulatory Visit: Payer: Self-pay | Admitting: Interventional Cardiology

## 2019-06-01 ENCOUNTER — Other Ambulatory Visit: Payer: Self-pay | Admitting: Family Medicine

## 2019-06-01 DIAGNOSIS — E785 Hyperlipidemia, unspecified: Secondary | ICD-10-CM

## 2019-06-01 DIAGNOSIS — E1169 Type 2 diabetes mellitus with other specified complication: Secondary | ICD-10-CM

## 2019-06-01 NOTE — Telephone Encounter (Signed)
Last OV 03/20/19 Last refill 03/17/19 #90/0 Next OV not scheduled

## 2019-06-06 ENCOUNTER — Other Ambulatory Visit: Payer: Self-pay

## 2019-06-07 ENCOUNTER — Encounter: Payer: Self-pay | Admitting: Family Medicine

## 2019-06-07 ENCOUNTER — Ambulatory Visit (INDEPENDENT_AMBULATORY_CARE_PROVIDER_SITE_OTHER): Payer: BC Managed Care – PPO | Admitting: Family Medicine

## 2019-06-07 VITALS — BP 120/70 | HR 98 | Resp 12 | Ht 65.0 in | Wt 280.0 lb

## 2019-06-07 DIAGNOSIS — Z794 Long term (current) use of insulin: Secondary | ICD-10-CM | POA: Diagnosis not present

## 2019-06-07 DIAGNOSIS — L02412 Cutaneous abscess of left axilla: Secondary | ICD-10-CM

## 2019-06-07 DIAGNOSIS — E1165 Type 2 diabetes mellitus with hyperglycemia: Secondary | ICD-10-CM

## 2019-06-07 DIAGNOSIS — IMO0002 Reserved for concepts with insufficient information to code with codable children: Secondary | ICD-10-CM

## 2019-06-07 MED ORDER — SULFAMETHOXAZOLE-TRIMETHOPRIM 800-160 MG PO TABS: 1.0000 | ORAL_TABLET | Freq: Two times a day (BID) | ORAL | 0 refills | Status: AC

## 2019-06-07 NOTE — Progress Notes (Signed)
ACUTE VISIT   HPI:  Chief Complaint  Patient presents with  . boil under arm    Heather Myers is a 54 y.o. female, who is here today complaining of tender lesion in left axilla. This is a new problem.  About 6 weeks ago she noted 2-3 "bumps" that gradually hot bigger and very tender. Pain is severe,constant, exacerbated by lying on right side and with palpation. She has not noted fever but has had chills and fatigue. No hx of trauma.  She has tried Ibuprofen and Neosporin. Also applied local warm compresses. Problem is not getting worse but not improving.  DM II: BS's "good." Following with endocrinologist,Dr Tamala Julian at [facility]. HgA1C 7.7 in 02/2019. On Invokana,Toujeo,and Metformin.  She is eating healthier and has noted wt loss. She has lost 10 Lb. She is not exercising regularly.  Review of Systems  Constitutional: Negative for activity change and appetite change.  HENT: Negative for mouth sores and sore throat.   Respiratory: Negative for cough, shortness of breath and wheezing.   Cardiovascular: Negative for chest pain.  Endocrine: Negative for polydipsia, polyphagia and polyuria.  Musculoskeletal: Negative for gait problem and myalgias.  Skin: Negative for rash and wound.  Allergic/Immunologic: Positive for environmental allergies.  Neurological: Negative for weakness, numbness and headaches.  Rest see pertinent positives and negatives per HPI.   Current Outpatient Medications on File Prior to Visit  Medication Sig Dispense Refill  . albuterol (PROVENTIL HFA;VENTOLIN HFA) 108 (90 Base) MCG/ACT inhaler Inhale 2 puffs into the lungs every 6 (six) hours as needed for wheezing or shortness of breath. 8.5 g 2  . atorvastatin (LIPITOR) 40 MG tablet TAKE 1 TABLET BY MOUTH EVERY DAY 90 tablet 0  . BAYER MICROLET LANCETS lancets Use as instructed to check sugar up to 6 times daily 600 each 5  . betamethasone valerate ointment (VALISONE) 0.1 % Apply 1  application topically daily as needed. ECZEMA  0  . Blood Glucose Monitoring Suppl (BAYER CONTOUR MONITOR) w/Device KIT Use to check sugar daily 1 each 0  . citalopram (CELEXA) 40 MG tablet TAKE 1 TABLET BY MOUTH EVERY DAY 90 tablet 1  . clobetasol cream (TEMOVATE) 5.05 % Apply 1 application topically as needed (infection or skin irritation).    . clobetasol ointment (TEMOVATE) 0.05 % Apply as directed twice daily for up to 2 weeks. Not for daily long term use 30 g 0  . furosemide (LASIX) 20 MG tablet Take 1 tablet (20 mg total) by mouth daily. Please schedule annual appt with Dr. Tamala Julian for refills. [phone]. 1st attempt. 30 tablet 0  . glucose blood (BAYER CONTOUR TEST) test strip Use as instructed to check sugar 6 times daily 600 each 5  . hydroquinone 4 % cream Apply 1 application topically 2 (two) times daily as needed (skin discoloration).    . insulin aspart (NOVOLOG FLEXPEN) 100 UNIT/ML FlexPen INJECT 25-30 UNITS 3 TIMES A DAY *15 MINUTES BEFORE MEALS* 24 mL 1  . Insulin Pen Needle 31G X 5 MM MISC Use 4x a day 400 each 3  . INVOKANA 300 MG TABS tablet TAKE 1 TABLET BY MOUTH EVERY DAY 90 tablet 3  . metFORMIN (GLUCOPHAGE-XR) 500 MG 24 hr tablet TAKE 2 TABLETS BY MOUTH TWICE A DAY 360 tablet 0  . metoprolol succinate (TOPROL-XL) 25 MG 24 hr tablet TAKE 1 TABLET BY MOUTH DAILY. TAKE WITH 50 MG FOR TOTAL OF 75 MG. 90 tablet 3  . metoprolol succinate (  TOPROL-XL) 50 MG 24 hr tablet Take 1 tablet by mouth daily with (25 mg) total 75 mg.Take with or immediately following a meal. 90 tablet 3  . Multiple Vitamins-Minerals (MULTIVITAMIN WITH MINERALS) tablet Take 1 tablet by mouth daily.    Marland Kitchen spironolactone (ALDACTONE) 25 MG tablet Take 1 tablet (25 mg total) by mouth 2 (two) times daily. Please call to schedule appt for future refills. 180 tablet 0  . TOUJEO SOLOSTAR 300 UNIT/ML SOPN INJECT 75 UNITS INTO THE SKIN DAILY 22.5 mL 2   No current facility-administered medications on file prior to  visit.     Past Medical History:  Diagnosis Date  . Allergic rhinitis   . Anemia   . Anxiety   . Arthritis   . Asthma, mild intermittent 12/19/2015  . Complication of anesthesia    difficulty waking up from anesthesia  . Depression   . Diabetes mellitus type 2 in obese ([place])   . Echocardiogram shows left ventricular diastolic dysfunction   . Fluid retention   . Hyperlipidemia   . Hypertension   . Morbid obesity ([place])   . Pneumonia    history of  . Sleep apnea    mild sleep, no CPAP needed at this time   Allergies  Allergen Reactions  . Ramipril Shortness Of Breath    Social History   Socioeconomic History  . Marital status: Legally Separated    Spouse name: Not on file  . Number of children: Not on file  . Years of education: Not on file  . Highest education level: Not on file  Occupational History  . Not on file  Tobacco Use  . Smoking status: Never Smoker  . Smokeless tobacco: Never Used  Substance and Sexual Activity  . Alcohol use: Not Currently  . Drug use: No  . Sexual activity: Not Currently    Partners: Male    Birth control/protection: I.U.D.  Other Topics Concern  . Not on file  Social History Narrative  . Not on file   Social Determinants of Health   Financial Resource Strain:   . Difficulty of Paying Living Expenses: Not on file  Food Insecurity:   . Worried About Charity fundraiser in the Last Year: Not on file  . Ran Out of Food in the Last Year: Not on file  Transportation Needs:   . Lack of Transportation (Medical): Not on file  . Lack of Transportation (Non-Medical): Not on file  Physical Activity:   . Days of Exercise per Week: Not on file  . Minutes of Exercise per Session: Not on file  Stress:   . Feeling of Stress : Not on file  Social Connections:   . Frequency of Communication with Friends and Family: Not on file  . Frequency of Social Gatherings with Friends and Family: Not on file  . Attends Religious Services: Not on  file  . Active Member of Clubs or Organizations: Not on file  . Attends Archivist Meetings: Not on file  . Marital Status: Not on file    Vitals:   06/07/19 1127  BP: 120/70  Pulse: 98  Resp: 12  SpO2: 93%   Wt Readings from Last 3 Encounters:  06/07/19 280 lb (127 kg)  03/20/19 280 lb 6.4 oz (127.2 kg)  11/14/18 283 lb 12.8 oz (128.7 kg)    Body mass index is 46.59 kg/m.   Physical Exam  Nursing note and vitals reviewed. Constitutional: She is oriented to person, place,  and time. She appears well-developed. No distress.  HENT:  Head: Normocephalic and atraumatic.  Mouth/Throat: Mucous membranes are normal.  Eyes: Pupils are equal, round, and reactive to light. Conjunctivae are normal.  Cardiovascular: Normal rate and regular rhythm.  No murmur heard. Respiratory: Effort normal and breath sounds normal. No respiratory distress.  Musculoskeletal:        General: No edema.  Lymphadenopathy:    She has no cervical adenopathy.  Neurological: She is alert and oriented to person, place, and time.  Skin: Skin is warm. No rash noted. Rash is not vesicular.  Left axilla: 3 cm nodular lesion palpated. + Local heat and induration,very tender. No fluctuant area. 3 papular lesions,mildly hypopigmented,no erythematous.  Psychiatric: She has a normal mood and affect. Her speech is normal.  Well groomed, good eye contact.    ASSESSMENT AND PLAN:  Ms. Irelynn was seen today for boil under arm.  Diagnoses and all orders for this visit:  Abscess of axilla, left Not ready to I&D. Continue warm compresses. Bactrim DS x 7 days. It may take a few weeks to resolved but it should greatly improved in 3-4 days with abx treatment. If not improved we may need surgical consultation.  -     sulfamethoxazole-trimethoprim (BACTRIM DS) 800-160 MG tablet; Take 1 tablet by mouth 2 (two) times daily for 7 days.  Insulin dependent type 2 diabetes mellitus, uncontrolled ([place]) HgA1C  is not at goal. Following with endocrinologist.  Morbid obesity ([place]) [place] 02/2018 she has lost 14 Lb. Encouraged to continue following a healthful diet and to engage in regular physical activity.   Return if symptoms worsen or fail to improve.   Heather Giuffre G. Martinique, MD  [facility]. [place] office.

## 2019-06-07 NOTE — Patient Instructions (Addendum)
A few things to remember from today's visit:   Abscess of axilla, left - Plan: sulfamethoxazole-trimethoprim (BACTRIM DS) 800-160 MG tablet  Local heat to continue. If fever please let us know.  If not better surgery referral may be necessary.   Skin Abscess  A skin abscess is an infected area on or under your skin that contains a collection of pus and other material. An abscess may also be called a furuncle, carbuncle, or boil. An abscess can occur in or on almost any part of your body. Some abscesses break open (rupture) on their own. Most continue to get worse unless they are treated. The infection can spread deeper into the body and eventually into your blood, which can make you feel ill. Treatment usually involves draining the abscess. What are the causes? An abscess occurs when germs, like bacteria, pass through your skin and cause an infection. This may be caused by:  A scrape or cut on your skin.  A puncture wound through your skin, including a needle injection or insect bite.  Blocked oil or sweat glands.  Blocked and infected hair follicles.  A cyst that forms beneath your skin (sebaceous cyst) and becomes infected. What increases the risk? This condition is more likely to develop in people who:  Have a weak body defense system (immune system).  Have diabetes.  Have dry and irritated skin.  Get frequent injections or use illegal IV drugs.  Have a foreign body in a wound, such as a splinter.  Have problems with their lymph system or veins. What are the signs or symptoms? Symptoms of this condition include:  A painful, firm bump under the skin.  A bump with pus at the top. This may break through the skin and drain. Other symptoms include:  Redness surrounding the abscess site.  Warmth.  Swelling of the lymph nodes (glands) near the abscess.  Tenderness.  A sore on the skin. How is this diagnosed? This condition may be diagnosed based on:  A physical  exam.  Your medical history.  A sample of pus. This may be used to find out what is causing the infection.  Blood tests.  Imaging tests, such as an ultrasound, CT scan, or MRI. How is this treated? A small abscess that drains on its own may not need treatment. Treatment for larger abscesses may include:  Moist heat or heat pack applied to the area several times a day.  A procedure to drain the abscess (incision and drainage).  Antibiotic medicines. For a severe abscess, you may first get antibiotics through an IV and then change to antibiotics by mouth. Follow these instructions at home: Medicines   Take over-the-counter and prescription medicines only as told by your health care provider.  If you were prescribed an antibiotic medicine, take it as told by your health care provider. Do not stop taking the antibiotic even if you start to feel better. Abscess care   If you have an abscess that has not drained, apply heat to the affected area. Use the heat source that your health care provider recommends, such as a moist heat pack or a heating pad. ? Place a towel between your skin and the heat source. ? Leave the heat on for 20-30 minutes. ? Remove the heat if your skin turns bright red. This is especially important if you are unable to feel pain, heat, or cold. You may have a greater risk of getting burned.  Follow instructions from your health care provider  about how to take care of your abscess. Make sure you: ? Cover the abscess with a bandage (dressing). ? Change your dressing or gauze as told by your health care provider. ? Wash your hands with soap and water before you change the dressing or gauze. If soap and water are not available, use hand sanitizer.  Check your abscess every day for signs of a worsening infection. Check for: ? More redness, swelling, or pain. ? More fluid or blood. ? Warmth. ? More pus or a bad smell. General instructions  To avoid spreading the  infection: ? Do not share personal care items, towels, or hot tubs with others. ? Avoid making skin contact with other people.  Keep all follow-up visits as told by your health care provider. This is important. Contact a health care provider if you have:  More redness, swelling, or pain around your abscess.  More fluid or blood coming from your abscess.  Warm skin around your abscess.  More pus or a bad smell coming from your abscess.  A fever.  Muscle aches.  Chills or a general ill feeling. Get help right away if you:  Have severe pain.  See red streaks on your skin spreading away from the abscess. Summary  A skin abscess is an infected area on or under your skin that contains a collection of pus and other material.  A small abscess that drains on its own may not need treatment.  Treatment for larger abscesses may include having a procedure to drain the abscess and taking an antibiotic. This information is not intended to replace advice given to you by your health care provider. Make sure you discuss any questions you have with your health care provider. Document Revised: 08/04/2018 Document Reviewed: 05/27/2017 Elsevier Patient Education  2020 [employer].  Please be sure medication list is accurate. If a new problem present, please set up appointment sooner than planned today.

## 2019-06-08 ENCOUNTER — Other Ambulatory Visit: Payer: Self-pay

## 2019-06-08 ENCOUNTER — Encounter: Payer: Self-pay | Admitting: Physician Assistant

## 2019-06-08 ENCOUNTER — Ambulatory Visit: Payer: BC Managed Care – PPO | Admitting: Physician Assistant

## 2019-06-08 DIAGNOSIS — M542 Cervicalgia: Secondary | ICD-10-CM | POA: Diagnosis not present

## 2019-06-08 MED ORDER — TRAMADOL HCL 50 MG PO TABS: 50.0000 mg | ORAL_TABLET | Freq: Four times a day (QID) | ORAL | 0 refills | Status: DC | PRN

## 2019-06-08 MED ORDER — METHOCARBAMOL 500 MG PO TABS: 500.0000 mg | ORAL_TABLET | Freq: Three times a day (TID) | ORAL | 1 refills | Status: DC

## 2019-06-08 NOTE — Progress Notes (Signed)
HPI: Heather Myers returns today follow-up after MRI of her cervical spine.  She states her neck pain is becoming worse.  7 pain that is radiating down the left arm.  She is also requesting that she have something give her tramadol states the Norco makes her go to sleep and also with the cyclobenzaprine makes her sleepy. MRI cervical spine dated 05/20/2019 is reviewed with the patient.  C6-C7 disc bulge with uncovertebral vertebral disease which encroaches upon the left C7 nerve root as it enters the foramina.  C5-C6 shallow disc bulge resulting in mild central canal stenosis but no cord deformity, foramina are open.  Impression: Cervical mild radicular symptoms left arm  Plan: We will refer her to Dr. Alvester Morin for epidural steroid injection at C6-C7 in the near future.  Like to see her back 2 weeks after the injection to see what type a result she has been in regards to this.  We will change her medications to tramadol and Robaxin she will discontinue the Norco and the Flexeril.  In regards to the left ring finger trigger finger she will continue the Voltaren gel as this is helping.  Questions were encouraged and answered.

## 2019-06-11 ENCOUNTER — Other Ambulatory Visit: Payer: Self-pay | Admitting: Physician Assistant

## 2019-06-21 ENCOUNTER — Ambulatory Visit: Payer: Self-pay

## 2019-06-21 ENCOUNTER — Ambulatory Visit: Payer: BC Managed Care – PPO | Admitting: Physical Medicine and Rehabilitation

## 2019-06-21 ENCOUNTER — Encounter: Payer: Self-pay | Admitting: Physical Medicine and Rehabilitation

## 2019-06-21 ENCOUNTER — Other Ambulatory Visit: Payer: Self-pay

## 2019-06-21 VITALS — BP 126/76 | HR 90

## 2019-06-21 DIAGNOSIS — M5412 Radiculopathy, cervical region: Secondary | ICD-10-CM | POA: Diagnosis not present

## 2019-06-21 MED ORDER — BETAMETHASONE SOD PHOS & ACET 6 (3-3) MG/ML IJ SUSP
12.0000 mg | Freq: Once | INTRAMUSCULAR | Status: AC
  Administered 2019-06-21: 16:00:00 12 mg

## 2019-06-21 NOTE — Progress Notes (Signed)
 .  Numeric Pain Rating Scale and Functional Assessment Average Pain 8.5   In the last MONTH (on 0-10 scale) has pain interfered with the following?  1. General activity like being  able to carry out your everyday physical activities such as walking, climbing stairs, carrying groceries, or moving a chair?  Rating(9)   +Driver, -BT, -Dye Allergies.

## 2019-06-25 ENCOUNTER — Other Ambulatory Visit: Payer: Self-pay | Admitting: Physician Assistant

## 2019-07-01 ENCOUNTER — Other Ambulatory Visit: Payer: Self-pay | Admitting: Interventional Cardiology

## 2019-07-12 ENCOUNTER — Ambulatory Visit: Payer: BC Managed Care – PPO | Admitting: Orthopaedic Surgery

## 2019-07-15 ENCOUNTER — Other Ambulatory Visit: Payer: Self-pay | Admitting: Physician Assistant

## 2019-07-16 ENCOUNTER — Other Ambulatory Visit: Payer: Self-pay | Admitting: Family Medicine

## 2019-07-16 ENCOUNTER — Other Ambulatory Visit: Payer: Self-pay | Admitting: Interventional Cardiology

## 2019-07-16 DIAGNOSIS — F418 Other specified anxiety disorders: Secondary | ICD-10-CM

## 2019-07-17 ENCOUNTER — Encounter: Payer: Self-pay | Admitting: Certified Nurse Midwife

## 2019-07-19 ENCOUNTER — Other Ambulatory Visit: Payer: Self-pay | Admitting: Physician Assistant

## 2019-08-02 DIAGNOSIS — Z794 Long term (current) use of insulin: Secondary | ICD-10-CM | POA: Diagnosis not present

## 2019-08-02 DIAGNOSIS — E119 Type 2 diabetes mellitus without complications: Secondary | ICD-10-CM | POA: Diagnosis not present

## 2019-08-02 DIAGNOSIS — I10 Essential (primary) hypertension: Secondary | ICD-10-CM | POA: Diagnosis not present

## 2019-08-02 DIAGNOSIS — E785 Hyperlipidemia, unspecified: Secondary | ICD-10-CM | POA: Diagnosis not present

## 2019-08-11 ENCOUNTER — Other Ambulatory Visit: Payer: Self-pay | Admitting: Physician Assistant

## 2019-08-11 ENCOUNTER — Other Ambulatory Visit: Payer: Self-pay | Admitting: Interventional Cardiology

## 2019-08-15 NOTE — Progress Notes (Signed)
Heather Myers - 54 y.o. female MRN [id number]  Date of birth: 08/01/1965  Office Visit Note: Visit Date: 06/21/2019 PCP: Martinique, Betty G, MD Referred by: Martinique, Betty G, MD  Subjective: Chief Complaint  Patient presents with  . Neck - Pain  . Left Shoulder - Pain  . Left Arm - Pain  . Left Hand - Tingling   HPI:  Heather Myers is a 54 y.o. female who comes in today For diagnostic notably therapeutic left C7-T1 interlaminar steroid injection at the request of Dr. Jean Rosenthal and Benita Stabile, PA-C.  She report pain in the neck mostly on the left side that radiates into the left shoulder, and left arm. Pt states some tingling in the left hand. Pt states symptoms started 2 months ago. Sleeping makes symptoms worse. Muscle relaxer helps with symptoms.  The patient has failed conservative care including home exercise, medications, time and activity modification.  This injection will be diagnostic and hopefully therapeutic.  Please see requesting physician notes for further details and justification.  MRI reviewed below showed some foraminal compression of probably the C7 nerve at C6-7.  She also has some central disc protrusions without compression.   ROS Otherwise per HPI.  Assessment & Plan: Visit Diagnoses:  1. Cervical radiculopathy     Plan: No additional findings.   Meds & Orders:  Meds ordered this encounter  Medications  . betamethasone acetate-betamethasone sodium phosphate (CELESTONE) injection 12 mg    Orders Placed This Encounter  Procedures  . XR C-ARM NO REPORT  . Epidural Steroid injection    Follow-up: Return if symptoms worsen or fail to improve.   Procedures: No procedures performed  Cervical Epidural Steroid Injection - Interlaminar Approach with Fluoroscopic Guidance  Patient: Heather Myers      Date of Birth: 1965/12/15 MRN: [id number] PCP: Martinique, Betty G, MD      Visit Date: 06/21/2019   Universal Protocol:      Date/Time: 04/20/216:05 AM  Consent Given By: the patient  Position: PRONE  Additional Comments: Vital signs were monitored before and after the procedure. Patient was prepped and draped in the usual sterile fashion. The correct patient, procedure, and site was verified.   Injection Procedure Details:  Procedure Site One Meds Administered:  Meds ordered this encounter  Medications  . betamethasone acetate-betamethasone sodium phosphate (CELESTONE) injection 12 mg     Laterality: Left  Location/Site: C7-T1  Needle size: 20 G  Needle type: Touhy  Needle Placement: Paramedian epidural space  Findings:  -Comments: Excellent flow of contrast into the epidural space.  Procedure Details: Using a paramedian approach from the side mentioned above, the region overlying the inferior lamina was localized under fluoroscopic visualization and the soft tissues overlying this structure were infiltrated with 4 ml. of 1% Lidocaine without Epinephrine. A # 20 gauge, Tuohy needle was inserted into the epidural space using a paramedian approach.  The epidural space was localized using loss of resistance along with lateral and contralateral oblique bi-planar fluoroscopic views.  After negative aspirate for air, blood, and CSF, a 2 ml. volume of Isovue-250 was injected into the epidural space and the flow of contrast was observed. Radiographs were obtained for documentation purposes.   The injectate was administered into the level noted above.  Additional Comments:  The patient tolerated the procedure well Dressing: 2 x 2 sterile gauze and Band-Aid    Post-procedure details: Patient was observed during the procedure. Post-procedure instructions were reviewed.  Patient left  the clinic in stable condition.     Clinical History: MRI CERVICAL SPINE WITHOUT CONTRAST  TECHNIQUE: Multiplanar, multisequence MR imaging of the cervical spine was performed. No intravenous contrast was  administered.  COMPARISON:  Plain film cervical spine 03/29/2019.  FINDINGS: Alignment: There is straightening of the normal cervical lordosis and very mild reversal kyphosis.  Vertebrae: No fracture, evidence of discitis, or bone lesion. Degenerative endplate signal change is most notable at C6-7. Mild thickening of the posterior longitudinal ligament is seen at C5, C6 and C7.  Cord: Normal signal throughout.  Posterior Fossa, vertebral arteries, paraspinal tissues: Negative.  Disc levels:  C2-3: Negative.  C3-4: Small central protrusion and mild uncovertebral disease without stenosis.  C4-5: The patient has a central disc protrusion with cephalad extension of approximately 0.6 cm. The disc effaces the ventral thecal sac but there is no cord deformity and the central canal is open. The foramina are patent.  C5-6: Shallow disc bulge to the left and some uncovertebral disease on the left. There is mild central canal narrowing but no cord deformity. The foramina are open.  C6-7: There is a shallow disc bulge with uncovertebral disease on the left and protruding disc in the left foraminal entry zone. There is encroachment on the left C7 root as it enters the foramen and within the foramen itself. The ventral thecal sac is effaced. Right foramen open.  C7-T1: Negative.  IMPRESSION: 1. Disc protrusion in the left foraminal entry zone disc and uncovertebral disease on the left at C6-7 result in impingement on the left C7 root as it enters the foramen and within the foramen itself. 2. Central disc protrusion with cephalad extension at C4-5 effaces the ventral thecal sac but the central canal is open. 3. Shallow disc bulge to the left at C5-6 right causes mild central canal narrowing. The foramina are open.   Electronically Signed   By: Drusilla Kanner M.D.   On: 05/20/2019 16:52     Objective:  VS:  HT:    WT:   BMI:     BP:126/76  HR:90bpm   TEMP: ( )  RESP:  Physical Exam  Ortho Exam Imaging: No results found.

## 2019-08-15 NOTE — Procedures (Signed)
Cervical Epidural Steroid Injection - Interlaminar Approach with Fluoroscopic Guidance  Patient: Heather Myers      Date of Birth: 1965/11/03 MRN: [id number] PCP: [place], Betty G, MD      Visit Date: 06/21/2019   Universal Protocol:    Date/Time: 04/20/216:05 AM  Consent Given By: the patient  Position: PRONE  Additional Comments: Vital signs were monitored before and after the procedure. Patient was prepped and draped in the usual sterile fashion. The correct patient, procedure, and site was verified.   Injection Procedure Details:  Procedure Site One Meds Administered:  Meds ordered this encounter  Medications  . betamethasone acetate-betamethasone sodium phosphate (CELESTONE) injection 12 mg     Laterality: Left  Location/Site: C7-T1  Needle size: 20 G  Needle type: Touhy  Needle Placement: Paramedian epidural space  Findings:  -Comments: Excellent flow of contrast into the epidural space.  Procedure Details: Using a paramedian approach from the side mentioned above, the region overlying the inferior lamina was localized under fluoroscopic visualization and the soft tissues overlying this structure were infiltrated with 4 ml. of 1% Lidocaine without Epinephrine. A # 20 gauge, Tuohy needle was inserted into the epidural space using a paramedian approach.  The epidural space was localized using loss of resistance along with lateral and contralateral oblique bi-planar fluoroscopic views.  After negative aspirate for air, blood, and CSF, a 2 ml. volume of Isovue-250 was injected into the epidural space and the flow of contrast was observed. Radiographs were obtained for documentation purposes.   The injectate was administered into the level noted above.  Additional Comments:  The patient tolerated the procedure well Dressing: 2 x 2 sterile gauze and Band-Aid    Post-procedure details: Patient was observed during the procedure. Post-procedure instructions  were reviewed.  Patient left the clinic in stable condition.

## 2019-08-24 ENCOUNTER — Other Ambulatory Visit: Payer: Self-pay | Admitting: Physician Assistant

## 2019-08-24 ENCOUNTER — Other Ambulatory Visit: Payer: Self-pay | Admitting: Interventional Cardiology

## 2019-08-31 ENCOUNTER — Other Ambulatory Visit: Payer: Self-pay | Admitting: Family Medicine

## 2019-08-31 ENCOUNTER — Other Ambulatory Visit: Payer: Self-pay | Admitting: Interventional Cardiology

## 2019-08-31 ENCOUNTER — Other Ambulatory Visit: Payer: Self-pay | Admitting: Physician Assistant

## 2019-08-31 DIAGNOSIS — E785 Hyperlipidemia, unspecified: Secondary | ICD-10-CM

## 2019-08-31 DIAGNOSIS — E1169 Type 2 diabetes mellitus with other specified complication: Secondary | ICD-10-CM

## 2019-09-01 MED ORDER — METOPROLOL SUCCINATE ER 50 MG PO TB24: ORAL_TABLET | ORAL | 0 refills | Status: DC

## 2019-09-01 MED ORDER — METOPROLOL SUCCINATE ER 25 MG PO TB24: ORAL_TABLET | ORAL | 0 refills | Status: DC

## 2019-09-20 ENCOUNTER — Telehealth (INDEPENDENT_AMBULATORY_CARE_PROVIDER_SITE_OTHER): Payer: BC Managed Care – PPO | Admitting: Internal Medicine

## 2019-09-20 ENCOUNTER — Other Ambulatory Visit: Payer: Self-pay

## 2019-09-20 ENCOUNTER — Encounter: Payer: Self-pay | Admitting: Internal Medicine

## 2019-09-20 VITALS — Ht 65.0 in | Wt 275.0 lb

## 2019-09-20 DIAGNOSIS — J22 Unspecified acute lower respiratory infection: Secondary | ICD-10-CM | POA: Diagnosis not present

## 2019-09-20 DIAGNOSIS — Z760 Encounter for issue of repeat prescription: Secondary | ICD-10-CM

## 2019-09-20 DIAGNOSIS — J069 Acute upper respiratory infection, unspecified: Secondary | ICD-10-CM

## 2019-09-20 DIAGNOSIS — J4520 Mild intermittent asthma, uncomplicated: Secondary | ICD-10-CM

## 2019-09-20 MED ORDER — ALBUTEROL SULFATE HFA 108 (90 BASE) MCG/ACT IN AERS: 2.0000 | INHALATION_SPRAY | Freq: Four times a day (QID) | RESPIRATORY_TRACT | 1 refills | Status: DC | PRN

## 2019-09-20 MED ORDER — HYDROCODONE-HOMATROPINE 5-1.5 MG/5ML PO SYRP: 5.0000 mL | ORAL_SOLUTION | Freq: Three times a day (TID) | ORAL | 0 refills | Status: DC | PRN

## 2019-09-20 NOTE — Progress Notes (Signed)
Virtual Visit via Video Note  I connected with@ on 09/20/19 at  4:00 PM EDT by a video enabled telemedicine application and verified that I am speaking with the correct person using two identifiers. Location patient: home Location provider:work office Persons participating in the virtual visit: patient, provider  [place] national recommendations  regarding COVID 19 pandemic   video visit is advised over in office visit for this patient.  Patient aware  of the limitations of evaluation and management by telemedicine and  availability of in person appointments. and agreed to proceed.   HPI: Heather Myers presents for video visit. Acute problem   Onset Sunday  Lost voice May 23  Could barely talk and was very congested and cough worse at night  And SOB.... tried old inhaler and not a lot of help   Minor tension headache  Taken otc products and mucinex .  But hard to bring up mucous and sneezing   And very fatigue    Winded   Up and down stairs    Tired  From coughing at night hard to sleep     .   Is diabetic   No fever.  Had covid shot  [employer]   February  No taste for one days  Was out Saturday    Inside  [employer].  Cough .  Exposure to someone without mask  Blood sugars running low for her in 120 range ok no lows ROS: See pertinent positives and negatives per HPI.  Past Medical History:  Diagnosis Date  . Allergic rhinitis   . Anemia   . Anxiety   . Arthritis   . Asthma, mild intermittent 12/19/2015  . Complication of anesthesia    difficulty waking up from anesthesia  . Depression   . Diabetes mellitus type 2 in obese ([place])   . Echocardiogram shows left ventricular diastolic dysfunction   . Fluid retention   . Hyperlipidemia   . Hypertension   . Morbid obesity ([place])   . Pneumonia    history of  . Sleep apnea    mild sleep, no CPAP needed at this time    Past Surgical History:  Procedure Laterality Date  . ABDOMINAL SURGERY     chronic abd wall  abscess  . CESAREAN SECTION     x's 2  . CHOLECYSTECTOMY    . DILATATION & CURETTAGE/HYSTEROSCOPY WITH MYOSURE N/A 12/08/2016   Procedure: DILATATION & CURETTAGE/HYSTEROSCOPY WITH MYOSURE;  Surgeon: Salvadore Dom, MD;  Location: [facility];  Service: Gynecology;  Laterality: N/A;  . INTRAUTERINE DEVICE (IUD) INSERTION N/A 12/08/2016   Procedure: MIRENA INTRAUTERINE DEVICE (IUD) INSERTION (IUD FROM OFFICE);  Surgeon: Salvadore Dom, MD;  Location: [facility];  Service: Gynecology;  Laterality: N/A;  . KNEE ARTHROSCOPY Right   . LIPOMA EXCISION  08/03/2011   Procedure: EXCISION LIPOMA;  Surgeon: Harl Bowie, MD;  Location: [facility];  Service: General;  Laterality: Right;  wide excision chronic abscess Right lower abdominal wall  . OOPHORECTOMY     R ovary removed at [facility]      Family History  Problem Relation Age of Onset  . Diabetes Mother   . Hypertension Mother   . Other Mother        renal failure  . Diabetes Other   . Hypertension Father   . Cancer Father     Social History   Tobacco Use  . Smoking status: Never Smoker  .  Smokeless tobacco: Never Used  Substance Use Topics  . Alcohol use: Not Currently  . Drug use: No      Current Outpatient Medications:  .  albuterol (VENTOLIN HFA) 108 (90 Base) MCG/ACT inhaler, Inhale 2 puffs into the lungs every 6 (six) hours as needed for wheezing or shortness of breath., Disp: 8.5 g, Rfl: 1 .  atorvastatin (LIPITOR) 40 MG tablet, TAKE 1 TABLET BY MOUTH EVERY DAY, Disp: 90 tablet, Rfl: 0 .  BAYER MICROLET LANCETS lancets, Use as instructed to check sugar up to 6 times daily, Disp: 600 each, Rfl: 5 .  betamethasone valerate ointment (VALISONE) 0.1 %, Apply 1 application topically daily as needed. ECZEMA, Disp: , Rfl: 0 .  Blood Glucose Monitoring Suppl (BAYER CONTOUR MONITOR) w/Device KIT, Use to check sugar daily, Disp: 1 each, Rfl: 0 .  citalopram (CELEXA) 40 MG tablet, TAKE 1 TABLET BY MOUTH  EVERY DAY, Disp: 90 tablet, Rfl: 1 .  clobetasol cream (TEMOVATE) 0.63 %, Apply 1 application topically as needed (infection or skin irritation)., Disp: , Rfl:  .  clobetasol ointment (TEMOVATE) 0.05 %, Apply as directed twice daily for up to 2 weeks. Not for daily long term use, Disp: 30 g, Rfl: 0 .  furosemide (LASIX) 20 MG tablet, Take 1 tablet (20 mg total) by mouth daily. Please make overdue appt with Dr. Tamala Julian before anymore refills. 3rd and Final Attempt, Disp: 15 tablet, Rfl: 0 .  glucose blood (BAYER CONTOUR TEST) test strip, Use as instructed to check sugar 6 times daily, Disp: 600 each, Rfl: 5 .  hydroquinone 4 % cream, Apply 1 application topically 2 (two) times daily as needed (skin discoloration)., Disp: , Rfl:  .  insulin aspart (NOVOLOG FLEXPEN) 100 UNIT/ML FlexPen, INJECT 25-30 UNITS 3 TIMES A DAY *15 MINUTES BEFORE MEALS*, Disp: 24 mL, Rfl: 1 .  Insulin Pen Needle 31G X 5 MM MISC, Use 4x a day, Disp: 400 each, Rfl: 3 .  INVOKANA 300 MG TABS tablet, TAKE 1 TABLET BY MOUTH EVERY DAY, Disp: 90 tablet, Rfl: 3 .  metFORMIN (GLUCOPHAGE-XR) 500 MG 24 hr tablet, TAKE 2 TABLETS BY MOUTH TWICE A DAY, Disp: 360 tablet, Rfl: 0 .  methocarbamol (ROBAXIN) 500 MG tablet, Take 1 tablet (500 mg total) by mouth 3 (three) times daily., Disp: 40 tablet, Rfl: 1 .  metoprolol succinate (TOPROL-XL) 25 MG 24 hr tablet, TAKE 1 TABLET BY MOUTH DAILY. TAKE WITH 50 MG FOR TOTAL OF 75 MG. Please make overdue appt with Dr. Tamala Julian before anymore refills. 3rd and Final Attempt, Disp: 15 tablet, Rfl: 0 .  metoprolol succinate (TOPROL-XL) 50 MG 24 hr tablet, TAKE 1 TABLET BY MOUTH DAILY WITH (25 MG) TOTAL 75 MG.TAKE WITH OR IMMEDIATELY FOLLOWING A MEAL. Please make overdue appt with Dr. Tamala Julian. 3rd and Final Attempt, Disp: 15 tablet, Rfl: 0 .  Multiple Vitamins-Minerals (MULTIVITAMIN WITH MINERALS) tablet, Take 1 tablet by mouth daily., Disp: , Rfl:  .  Semaglutide, 1 MG/DOSE, (OZEMPIC, 1 MG/DOSE,) 4 MG/3ML SOPN, Inject  into the skin., Disp: , Rfl:  .  spironolactone (ALDACTONE) 25 MG tablet, TAKE 1 TABLET BY MOUTH TWICE A DAY, Disp: 180 tablet, Rfl: 1 .  TOUJEO SOLOSTAR 300 UNIT/ML SOPN, INJECT 75 UNITS INTO THE SKIN DAILY, Disp: 22.5 mL, Rfl: 2 .  traMADol (ULTRAM) 50 MG tablet, Take 1 tablet (50 mg total) by mouth every 6 (six) hours as needed., Disp: 30 tablet, Rfl: 0 .  HYDROcodone-homatropine (HYCODAN) 5-1.5 MG/5ML syrup, Take  5 mLs by mouth every 8 (eight) hours as needed for cough., Disp: 120 mL, Rfl: 0  EXAM: BP Readings from Last 3 Encounters:  06/21/19 126/76  06/07/19 120/70  03/20/19 140/78    VITALS per patient if applicable: no fever   GENERAL: alert, oriented, appears well and in no acute distress  Mild congestion   HEENT: atraumatic, conjunttiva clear, no obvious abnormalities on inspection of external nose and ears NECK: normal movements of the head and neck LUNGS: on inspection no signs of respiratory distress, breathing rate appears normal, no obvious gross SOB, gasping or wheezing CV: no obvious cyanosis  PSYCH/NEURO: pleasant and cooperative, no obvious depression or anxiety, speech and thought processing grossly intact   ASSESSMENT AND PLAN:  Discussed the following assessment and plan:    ICD-10-CM   1. Acute respiratory infection  J22   2. Viral upper respiratory tract infection with cough  J06.9   3. Mild intermittent asthma without complication  X65.53 albuterol (VENTOLIN HFA) 108 (90 Base) MCG/ACT inhaler  4. Medication refill  Z76.0 albuterol (VENTOLIN HFA) 108 (90 Base) MCG/ACT inhaler    Counseled.  Sounds viral resp infection currently uncomplicated  Get covid tested even though vaccinated since  Interventions available   Expectant management and discussion of plan and treatment with opportunity to ask questions and all were answered. The patient agreed with the plan and demonstrated an understanding of the instructions.  cough med for comfort  At night and aware   Monitor bg  Refill albuterol   Fu with pcp if  persistent or progressive  Advised to call back or seek an in-person evaluation if worsening  or having  further concerns . Return if symptoms worsen or fail to improve as expected.   Shanon Ace, MD

## 2019-09-26 ENCOUNTER — Other Ambulatory Visit: Payer: Self-pay | Admitting: Interventional Cardiology

## 2019-10-03 ENCOUNTER — Other Ambulatory Visit: Payer: Self-pay | Admitting: Interventional Cardiology

## 2019-10-07 ENCOUNTER — Other Ambulatory Visit: Payer: Self-pay | Admitting: Interventional Cardiology

## 2019-10-09 ENCOUNTER — Telehealth: Payer: Self-pay | Admitting: Interventional Cardiology

## 2019-10-09 NOTE — Telephone Encounter (Signed)
*  STAT* If patient is at the pharmacy, call can be transferred to refill team.   1. Which medications need to be refilled? (please list name of each medication and dose if known)   metoprolol succinate (TOPROL-XL) 25 MG 24 hr tablet  metoprolol succinate (TOPROL-XL) 50 MG 24 hr tablet   2. Which pharmacy/location (including street and city if local pharmacy) is medication to be sent to?[id number] - [place], [place] - [address] AT [address] [facility] [address]  3. Do they need a 30 day or 90 day supply? 90   Patient completely out of medication.

## 2019-10-10 ENCOUNTER — Other Ambulatory Visit: Payer: Self-pay

## 2019-10-10 MED ORDER — METOPROLOL SUCCINATE ER 50 MG PO TB24: ORAL_TABLET | ORAL | 0 refills | Status: DC

## 2019-10-10 MED ORDER — METOPROLOL SUCCINATE ER 25 MG PO TB24: ORAL_TABLET | ORAL | 0 refills | Status: DC

## 2019-10-14 ENCOUNTER — Other Ambulatory Visit: Payer: Self-pay | Admitting: Interventional Cardiology

## 2019-10-18 DIAGNOSIS — Z113 Encounter for screening for infections with a predominantly sexual mode of transmission: Secondary | ICD-10-CM | POA: Diagnosis not present

## 2019-10-18 DIAGNOSIS — K5900 Constipation, unspecified: Secondary | ICD-10-CM | POA: Diagnosis not present

## 2019-10-18 DIAGNOSIS — N761 Subacute and chronic vaginitis: Secondary | ICD-10-CM | POA: Diagnosis not present

## 2019-10-18 DIAGNOSIS — R102 Pelvic and perineal pain: Secondary | ICD-10-CM | POA: Diagnosis not present

## 2019-11-01 ENCOUNTER — Other Ambulatory Visit: Payer: Self-pay | Admitting: Interventional Cardiology

## 2019-11-02 NOTE — Progress Notes (Deleted)
Cardiology Office Note    Date:  11/02/2019   ID:  CARLO LORSON, DOB 1965-08-05, MRN [id number]  PCP:  [place], Betty G, MD  Cardiologist: Lesleigh Noe, MD EPS: None  No chief complaint on file.   History of Present Illness:  Heather Myers is a 54 y.o. female with history of morbid obesity, fluid retention, HTN, HLD, DM. NST 2012 no ischemia, echo 2018 EF 55-60%, grade 1 DD, palpitations controlled with metoprolol.  Last seen in our office 02/2018 with chronic 2 pillow orthopnea unchanged. Echo ordered but never done.   Past Medical History:  Diagnosis Date   Allergic rhinitis    Anemia    Anxiety    Arthritis    Asthma, mild intermittent 12/19/2015   Complication of anesthesia    difficulty waking up from anesthesia   Depression    Diabetes mellitus type 2 in obese [facility])    Echocardiogram shows left ventricular diastolic dysfunction    Fluid retention    Hyperlipidemia    Hypertension    Morbid obesity ([facility])    Pneumonia    history of   Sleep apnea    mild sleep, no CPAP needed at this time    Past Surgical History:  Procedure Laterality Date   ABDOMINAL SURGERY     chronic abd wall abscess   CESAREAN SECTION     x's 2   CHOLECYSTECTOMY     DILATATION & CURETTAGE/HYSTEROSCOPY WITH MYOSURE N/A 12/08/2016   Procedure: DILATATION & CURETTAGE/HYSTEROSCOPY WITH MYOSURE;  Surgeon: Romualdo Bolk, MD;  Location: [facility];  Service: Gynecology;  Laterality: N/A;   INTRAUTERINE DEVICE (IUD) INSERTION N/A 12/08/2016   Procedure: MIRENA INTRAUTERINE DEVICE (IUD) INSERTION (IUD FROM OFFICE);  Surgeon: Romualdo Bolk, MD;  Location: [facility];  Service: Gynecology;  Laterality: N/A;   KNEE ARTHROSCOPY Right    LIPOMA EXCISION  08/03/2011   Procedure: EXCISION LIPOMA;  Surgeon: Shelly Rubenstein, MD;  Location: [facility];  Service: General;  Laterality: Right;  wide excision chronic abscess Right lower  abdominal wall   OOPHORECTOMY     R ovary removed at [facility]      Current Medications: No outpatient medications have been marked as taking for the 11/07/19 encounter (Appointment) with Dyann Kief, PA-C.     Allergies:   Ramipril   Social History   Socioeconomic History   Marital status: Legally Separated    Spouse name: Not on file   Number of children: Not on file   Years of education: Not on file   Highest education level: Not on file  Occupational History   Not on file  Tobacco Use   Smoking status: Never Smoker   Smokeless tobacco: Never Used  Substance and Sexual Activity   Alcohol use: Not Currently   Drug use: No   Sexual activity: Not Currently    Partners: Male    Birth control/protection: I.U.D.  Other Topics Concern   Not on file  Social History Narrative   Not on file   Social Determinants of Health   Financial Resource Strain:    Difficulty of Paying Living Expenses:   Food Insecurity:    Worried About Running Out of Food in the Last Year:    Barista in the Last Year:   Transportation Needs:    Freight forwarder (Medical):    Lack of Transportation (Non-Medical):   Physical Activity:  Days of Exercise per Week:    Minutes of Exercise per Session:   Stress:    Feeling of Stress :   Social Connections:    Frequency of Communication with Friends and Family:    Frequency of Social Gatherings with Friends and Family:    Attends Religious Services:    Active Member of Clubs or Organizations:    Attends Engineer, structural:    Marital Status:      Family History:  The patient's ***family history includes Cancer in her father; Diabetes in her mother and another family member; Hypertension in her father and mother; Other in her mother.   ROS:   Please see the history of present illness.    ROS All other systems reviewed and are negative.   PHYSICAL EXAM:   VS:  There were no vitals  taken for this visit.  Physical Exam  GEN: Well nourished, well developed, in no acute distress  HEENT: normal  Neck: no JVD, carotid bruits, or masses Cardiac:RRR; no murmurs, rubs, or gallops  Respiratory:  clear to auscultation bilaterally, normal work of breathing GI: soft, nontender, nondistended, + BS Ext: without cyanosis, clubbing, or edema, Good distal pulses bilaterally MS: no deformity or atrophy  Skin: warm and dry, no rash Neuro:  Alert and Oriented x 3, Strength and sensation are intact Psych: euthymic mood, full affect  Wt Readings from Last 3 Encounters:  09/20/19 275 lb (124.7 kg)  06/07/19 280 lb (127 kg)  03/20/19 280 lb 6.4 oz (127.2 kg)      Studies/Labs Reviewed:   EKG:  EKG is*** ordered today.  The ekg ordered today demonstrates ***  Recent Labs: No results found for requested labs within last 8760 hours.   Lipid Panel    Component Value Date/Time   CHOL 156 03/03/2018 1312   TRIG 188 (H) 03/03/2018 1312   HDL 43 03/03/2018 1312   CHOLHDL 3.6 03/03/2018 1312   CHOLHDL 6 06/15/2017 1444   VLDL 25.8 06/15/2017 1444   LDLCALC 75 03/03/2018 1312    Additional studies/ records that were reviewed today include:  Echo 05/2016 Study Conclusions   - Left ventricle: The cavity size was normal. Systolic function was    normal. The estimated ejection fraction was in the range of 55%    to 60%. Wall motion was normal; there were no regional wall    motion abnormalities. Doppler parameters are consistent with    abnormal left ventricular relaxation (grade 1 diastolic    dysfunction). Acoustic contrast opacification revealed no    evidence ofthrombus.      ASSESSMENT:    No diagnosis found.   PLAN:  In order of problems listed above:  Chronic dyspnea on exertion multifactorial-obesity, deconditioning  Fluid retention on lasix  HTN  HLD  History of palpitations on metoprolol  Morbid obesity    Medication Adjustments/Labs and Tests  Ordered: Current medicines are reviewed at length with the patient today.  Concerns regarding medicines are outlined above.  Medication changes, Labs and Tests ordered today are listed in the Patient Instructions below. There are no Patient Instructions on file for this visit.   Elson Clan, PA-C  11/02/2019 9:35 AM    [facility] [address] [place], [place], [place]  [address] Phone: [phone]; Fax: [phone]

## 2019-11-07 ENCOUNTER — Ambulatory Visit: Payer: BC Managed Care – PPO | Admitting: Physician Assistant

## 2019-11-07 NOTE — Progress Notes (Signed)
CARDIOLOGY OFFICE NOTE  Date:  11/08/2019     Lavell Anchors Date of Birth: 1965-08-07 Medical Record #[id number]  PCP:  [place], Betty G, MD  Cardiologist:  Tamala Julian   Chief Complaint  Patient presents with  . Follow-up    History of Present Illness: Heather Myers is a 54 y.o. female who presents today for a follow up visit. Seen for Dr. Tamala Julian.   She has a history of morbid obesity, fluid retention, mild OSA (by prior sleep study), DM, HTN, HLD (followed by PCP), and iron deficiency anemia  (related to prior heavy menstruation).  She has been followed by Dr. Tamala Julian for dyspnea. Nuclear stress test 07/2010 showed no ischemia, probably motion artifact, possibility of EF abnormality, but clarified with normal echo. Last 2D echo 05/2016 showed EF 55-60%, grade 1 DD. She has a history of fluid retention treated with Lasix. Back in 2018, there was a request from dermatology to add spironolactone in her regimen as a means to control hair loss and acne. At that visit, Lasix was decreased to 87m daily and spironolactone was added at 533mdaily. She has a h/o palpitations controlled with metoprolol.  She was last seen in November of 2019.   Comes in today. Here alone. Says she is doing well. Needs medicines refilled. Remains fatigued. Has chronic trouble falling asleep. Uses some melatonin. Has had prior sleep study in the remote past - was told she had mild OSA and no treatment indicated. She is actively losing weight due to her diabetic medicines. Needs baseline labs today. No chest pain. Breathing is stable. No real issue with palpitations. She does not exercise.   Past Medical History:  Diagnosis Date  . Allergic rhinitis   . Anemia   . Anxiety   . Arthritis   . Asthma, mild intermittent 12/19/2015  . Complication of anesthesia    difficulty waking up from anesthesia  . Depression   . Diabetes [place] 2 in obese [place]  . Echocardiogram shows left ventricular  diastolic dysfunction   . Fluid retention   . Hyperlipidemia   . Hypertension   . Morbid obesity ([facility]  . Pneumonia    history of  . Sleep apnea    mild sleep, no CPAP needed at this time    Past Surgical History:  Procedure Laterality Date  . ABDOMINAL SURGERY     chronic abd wall abscess  . CESAREAN SECTION     x's 2  . CHOLECYSTECTOMY    . DILATATION & CURETTAGE/HYSTEROSCOPY WITH MYOSURE N/A 12/08/2016   Procedure: DILATATION & CURETTAGE/HYSTEROSCOPY WITH MYOSURE;  Surgeon: JeSalvadore DomMD;  [facility];  Service: Gynecology;  Laterality: N/A;  . INTRAUTERINE DEVICE (IUD) INSERTION N/A 12/08/2016   Procedure: MIRENA INTRAUTERINE DEVICE (IUD) INSERTION (IUD FROM OFFICE);  Surgeon: JeSalvadore DomMD;  [facility];  Service: Gynecology;  Laterality: N/A;  . KNEE ARTHROSCOPY Right   . LIPOMA EXCISION  08/03/2011   Procedure: EXCISION LIPOMA;  Surgeon: DoHarl BowieMD;  [place] [facility] Service: General;  Laterality: Right;  wide excision chronic abscess Right lower abdominal wall  . [facility]OP[facility]ECTOMY     [facility]       Medications: Current Meds  Medication Sig  . albuterol (VENTOLIN HFA) 108 (90 Base) MCG/ACT inhaler Inhale 2 puffs into the lungs every 6 (six) hours as needed for wheezing or shortness of breath.  .Marland Kitchen  atorvastatin (LIPITOR) 40 MG tablet TAKE 1 TABLET BY MOUTH EVERY DAY  . BAYER MICROLET LANCETS lancets Use as instructed to check sugar up to 6 times daily  . Blood Glucose Monitoring Suppl (BAYER CONTOUR MONITOR) w/Device KIT Use to check sugar daily  . citalopram (CELEXA) 40 MG tablet TAKE 1 TABLET BY MOUTH EVERY DAY  . furosemide (LASIX) 20 MG tablet Take 1 tablet (20 mg total) by mouth daily.  Marland Kitchen glucose blood (BAYER CONTOUR TEST) test strip Use as instructed to check sugar 6 times daily  . insulin aspart (NOVOLOG FLEXPEN) 100 UNIT/ML FlexPen INJECT 25-30 UNITS 3 TIMES A DAY *15 MINUTES BEFORE  MEALS*  . Insulin Pen Needle 31G X 5 MM MISC Use 4x a day  . INVOKANA 300 MG TABS tablet TAKE 1 TABLET BY MOUTH EVERY DAY  . metFORMIN (GLUCOPHAGE-XR) 500 MG 24 hr tablet TAKE 2 TABLETS BY MOUTH TWICE A DAY  . metoprolol succinate (TOPROL-XL) 25 MG 24 hr tablet TAKE 1 TABLET BY MOUTH DAILY. TAKE WITH 50 MG FOR TOTAL OF 75 MG.  . metoprolol succinate (TOPROL-XL) 50 MG 24 hr tablet TAKE 1 TABLET BY MOUTH DAILY WITH (25 MG) TOTAL 75 MG.TAKE WITH OR IMMEDIATELY FOLLOWING A MEAL.  . Multiple Vitamins-Minerals (MULTIVITAMIN WITH MINERALS) tablet Take 1 tablet by mouth daily.  . Semaglutide, 1 MG/DOSE, (OZEMPIC, 1 MG/DOSE,) 4 MG/3ML SOPN Inject into the skin.  Marland Kitchen spironolactone (ALDACTONE) 25 MG tablet Take 1 tablet (25 mg total) by mouth 2 (two) times daily.  Nelva Nay SOLOSTAR 300 UNIT/ML SOPN INJECT 75 UNITS INTO THE SKIN DAILY  . [DISCONTINUED] furosemide (LASIX) 20 MG tablet Take 1 tablet (20 mg total) by mouth daily. Please keep upcoming appt in July before anymore refills. Final Attempt  . [DISCONTINUED] metoprolol succinate (TOPROL-XL) 25 MG 24 hr tablet TAKE 1 TABLET BY MOUTH DAILY. TAKE WITH 50 MG FOR TOTAL OF 75 MG. Please keep upcoming appt for further refills  . [DISCONTINUED] metoprolol succinate (TOPROL-XL) 50 MG 24 hr tablet TAKE 1 TABLET BY MOUTH DAILY WITH (25 MG) TOTAL 75 MG.TAKE WITH OR IMMEDIATELY FOLLOWING A MEAL. Please keep upcoming appt for further refills  . [DISCONTINUED] spironolactone (ALDACTONE) 25 MG tablet TAKE 1 TABLET BY MOUTH TWICE A DAY     Allergies: Allergies  Allergen Reactions  . Ramipril Shortness Of Breath    Social History: The patient  reports that she has never smoked. She has never used smokeless tobacco. She reports previous alcohol use. She reports that she does not use drugs.   Family History: The patient's family history includes Cancer in her father; Diabetes in her mother and another family member; Hypertension in her father and mother; Other in  her mother.   Review of Systems: Please see the history of present illness.   All other systems are reviewed and negative.   Physical Exam: VS:  BP 110/78   Pulse 84   Ht '5\' 5"'  (1.651 m) Comment: per pt  Wt 268 lb (121.6 kg)   SpO2 97%   BMI 44.60 kg/m  .  BMI Body mass index is 44.6 kg/m.  Wt Readings from Last 3 Encounters:  11/08/19 268 lb (121.6 kg)  09/20/19 275 lb (124.7 kg)  06/07/19 280 lb (127 kg)    General: Alert and in no acute distress. Morbidly obese.  Cardiac: Regular rate and rhythm. No murmurs, rubs, or gallops. No edema.  Respiratory:  Lungs are clear to auscultation bilaterally with normal work of breathing.  GI: Soft and nontender.  MS: No deformity or atrophy. Gait and ROM intact.  Skin: Warm and dry. Color is normal.  Neuro:  Strength and sensation are intact and no gross focal deficits noted.  Psych: Alert, appropriate and with normal affect.   LABORATORY DATA:  EKG:  EKG is ordered today.  Personally reviewed by me. This demonstrates NSR - HR is 84  Lab Results  Component Value Date   WBC 10.7 03/03/2018   HGB 15.6 03/03/2018   HCT 46.5 03/03/2018   PLT 447 03/03/2018   GLUCOSE 96 03/03/2018   CHOL 156 03/03/2018   TRIG 188 (H) 03/03/2018   HDL 43 03/03/2018   LDLCALC 75 03/03/2018   ALT 14 03/03/2018   AST 17 03/03/2018   NA 140 03/03/2018   K 4.4 03/03/2018   CL 100 03/03/2018   CREATININE 0.75 03/03/2018   BUN 18 03/03/2018   CO2 22 03/03/2018   TSH 2.020 03/03/2018   HGBA1C 8.5 (H) 03/11/2018   MICROALBUR 1.8 06/15/2017     BNP (last 3 results) No results for input(s): BNP in the last 8760 hours.  ProBNP (last 3 results) No results for input(s): PROBNP in the last 8760 hours.   Other Studies Reviewed Today:  Echo Study Conclusions 05/2016  - Left ventricle: The cavity size was normal. Systolic function was  normal. The estimated ejection fraction was in the range of 55%  to 60%. Wall motion was normal; there were  no regional wall  motion abnormalities. Doppler parameters are consistent with  abnormal left ventricular relaxation (grade 1 diastolic  dysfunction). Acoustic contrast opacification revealed no  evidence of thrombus.    ASSESSMENT & PLAN:   1. Chronic DOE - last echo stable from 2018 - does have diastolic dysfunction - symptoms are stable. Has good BP control. Weight is down.   2. HTN - BP is good - medicines refilled today and will continue with the Aldactone, Lasix and Lopressor.   3. HLD - on statin - lab per PCP  4. Morbid obesity - actively losing weight - encouraged her to continue with her efforts.   5. Palpitations - not really an issue - she will continue with her Toprol.   Current medicines are reviewed with the patient today.  The patient does not have concerns regarding medicines other than what has been noted above.  The following changes have been made:  See above.  Labs/ tests ordered today include:    Orders Placed This Encounter  Procedures  . Basic metabolic panel  . CBC     Disposition:   FU with Dr. Tamala Julian in one year. Baseline lab today. Medicines refilled today.      Patient is agreeable to this plan and will call if any problems develop in the interim.   SignedTruitt Merle, NP  11/08/2019 3:42 PM  [facility] [address] [place], [place]  [address] Phone: [phone] Fax: [phone]

## 2019-11-08 ENCOUNTER — Other Ambulatory Visit: Payer: Self-pay

## 2019-11-08 ENCOUNTER — Encounter: Payer: Self-pay | Admitting: Nurse Practitioner

## 2019-11-08 ENCOUNTER — Ambulatory Visit: Payer: BC Managed Care – PPO | Admitting: Nurse Practitioner

## 2019-11-08 VITALS — BP 110/78 | HR 84 | Ht 65.0 in | Wt 268.0 lb

## 2019-11-08 DIAGNOSIS — I10 Essential (primary) hypertension: Secondary | ICD-10-CM

## 2019-11-08 DIAGNOSIS — Z79899 Other long term (current) drug therapy: Secondary | ICD-10-CM

## 2019-11-08 DIAGNOSIS — R0609 Other forms of dyspnea: Secondary | ICD-10-CM

## 2019-11-08 DIAGNOSIS — R5383 Other fatigue: Secondary | ICD-10-CM | POA: Diagnosis not present

## 2019-11-08 DIAGNOSIS — R0600 Dyspnea, unspecified: Secondary | ICD-10-CM

## 2019-11-08 MED ORDER — METOPROLOL SUCCINATE ER 50 MG PO TB24: ORAL_TABLET | ORAL | 3 refills | Status: DC

## 2019-11-08 MED ORDER — SPIRONOLACTONE 25 MG PO TABS: 25.0000 mg | ORAL_TABLET | Freq: Two times a day (BID) | ORAL | 3 refills | Status: DC

## 2019-11-08 MED ORDER — FUROSEMIDE 20 MG PO TABS: 20.0000 mg | ORAL_TABLET | Freq: Every day | ORAL | 3 refills | Status: DC

## 2019-11-08 MED ORDER — METOPROLOL SUCCINATE ER 25 MG PO TB24: ORAL_TABLET | ORAL | 3 refills | Status: DC

## 2019-11-08 NOTE — Patient Instructions (Addendum)
After Visit Summary:  We will be checking the following labs today - BMET & CBC   Medication Instructions:    Continue with your current medicines.  I sent in your refills today   If you need a refill on your cardiac medications before your next appointment, please call your pharmacy.     Testing/Procedures To Be Arranged:  N/A  Follow-Up:   See Dr. Katrinka Blazing in one year - * You will receive a reminder letter in the mail two months in advance. If you don't receive a letter, please call our office to schedule the follow-up appointment.     At [facility], you and your health needs are our priority.  As part of our continuing mission to provide you with exceptional heart care, we have created designated Provider Care Teams.  These Care Teams include your primary Cardiologist (physician) and Advanced Practice Providers (APPs -  Physician Assistants and Nurse Practitioners) who all work together to provide you with the care you need, when you need it.  Special Instructions:  . Stay safe, wash your hands for at least 20 seconds and wear a mask when needed.  . It was good to talk with you today.    Call the [facility] office at [phone] if you have any questions, problems or concerns.

## 2019-11-09 LAB — BASIC METABOLIC PANEL
BUN/Creatinine Ratio: 17 (ref 9–23)
BUN: 12 mg/dL (ref 6–24)
CO2: 21 mmol/L (ref 20–29)
Calcium: 10 mg/dL (ref 8.7–10.2)
Chloride: 103 mmol/L (ref 96–106)
Creatinine, Ser: 0.7 mg/dL (ref 0.57–1.00)
GFR calc Af Amer: 115 mL/min/{1.73_m2} (ref >59)
GFR calc non Af Amer: 100 mL/min/{1.73_m2} (ref >59)
Glucose: 171 mg/dL — ABNORMAL HIGH (ref 65–99)
Potassium: 4.5 mmol/L (ref 3.5–5.2)
Sodium: 140 mmol/L (ref 134–144)

## 2019-11-09 LAB — CBC
Hematocrit: 45.4 % (ref 34.0–46.6)
Hemoglobin: 15.3 g/dL (ref 11.1–15.9)
MCH: 31.7 pg (ref 26.6–33.0)
MCHC: 33.7 g/dL (ref 31.5–35.7)
MCV: 94 fL (ref 79–97)
Platelets: 399 10*3/uL (ref 150–450)
RBC: 4.82 x10E6/uL (ref 3.77–5.28)
RDW: 12.4 % (ref 11.7–15.4)
WBC: 8.9 10*3/uL (ref 3.4–10.8)

## 2019-11-14 NOTE — Addendum Note (Signed)
Addended by: Nedra Hai on: 11/14/2019 08:47 AM   Modules accepted: Orders

## 2019-11-23 ENCOUNTER — Encounter: Payer: Self-pay | Admitting: Family Medicine

## 2019-11-23 ENCOUNTER — Ambulatory Visit (INDEPENDENT_AMBULATORY_CARE_PROVIDER_SITE_OTHER): Payer: BC Managed Care – PPO | Admitting: Family Medicine

## 2019-11-23 ENCOUNTER — Other Ambulatory Visit: Payer: Self-pay

## 2019-11-23 VITALS — BP 126/78 | HR 87 | Temp 98.7°F | Wt 267.0 lb

## 2019-11-23 DIAGNOSIS — IMO0002 Reserved for concepts with insufficient information to code with codable children: Secondary | ICD-10-CM

## 2019-11-23 DIAGNOSIS — E1165 Type 2 diabetes mellitus with hyperglycemia: Secondary | ICD-10-CM

## 2019-11-23 DIAGNOSIS — H6002 Abscess of left external ear: Secondary | ICD-10-CM

## 2019-11-23 DIAGNOSIS — Z794 Long term (current) use of insulin: Secondary | ICD-10-CM

## 2019-11-23 MED ORDER — AMOXICILLIN-POT CLAVULANATE 875-125 MG PO TABS: 1.0000 | ORAL_TABLET | Freq: Two times a day (BID) | ORAL | 0 refills | Status: DC

## 2019-11-23 NOTE — Progress Notes (Signed)
Subjective:    Patient ID: Heather Myers, female    DOB: July 15, 1965, 54 y.o.   MRN: [id number]  No chief complaint on file.   HPI Patient was seen today for acute concern.  Pt endorses painful bump in left ear x3 days.  Pt states the pain is causing headaches and edema in left ear/face.  The edema has improved but the ear is still painful.  Pt denies fever, chills, n/v, decreased hearing.  Pt endorses compliance with DM meds, but not eating as well as she could. fsbs elevated 150s-160s at home.  Followed by Dr. Katrinka Blazing at [facility] endocrinology.  Last hgb A1C 8.6%?  Taking trujeo 80 units qhs, ozempic 1 mg, metformin xr 1000 mg twice daily, novolog 25 units with meals, and Invokana 300 mg daily.  Past Medical History:  Diagnosis Date  . Allergic rhinitis   . Anemia   . Anxiety   . Arthritis   . Asthma, mild intermittent 12/19/2015  . Complication of anesthesia    difficulty waking up from anesthesia  . Depression   . Diabetes mellitus type 2 in obese (HCC)   . Echocardiogram shows left ventricular diastolic dysfunction   . Fluid retention   . Hyperlipidemia   . Hypertension   . Morbid obesity (HCC)   . Pneumonia    history of  . Sleep apnea    mild sleep, no CPAP needed at this time    Allergies  Allergen Reactions  . Ramipril Shortness Of Breath    ROS General: Denies fever, chills, night sweats, changes in weight, changes in appetite HEENT: Denies headaches, ear pain, changes in vision, rhinorrhea, sore throat CV: Denies CP, palpitations, SOB, orthopnea Pulm: Denies SOB, cough, wheezing GI: Denies abdominal pain, nausea, vomiting, diarrhea, constipation GU: Denies dysuria, hematuria, frequency, vaginal discharge Msk: Denies muscle cramps, joint pains Neuro: Denies weakness, numbness, tingling Skin: Denies rashes, bruising Psych: Denies depression, anxiety, hallucinations  + bump in left ear     Objective:    Blood pressure 126/78, pulse 87, temperature  98.7 F (37.1 C), temperature source Oral, weight (!) 267 lb (121.1 kg), SpO2 98 %.   Gen. Pleasant, well-nourished, in no distress, normal affect   HEENT: [place]/AT, face symmetric, conjunctiva clear, no scleral icterus, PERRLA, EOMI, nares patent without drainage Lungs: no accessory muscle use Cardiovascular: RRR, no peripheral edema Musculoskeletal: No deformities, no cyanosis or clubbing, normal tone Neuro:  A&Ox3, CN II-XII intact, normal gait Skin:  Warm, dry, intact, no rash.  2 mm pustule at concha cavum at base of L ear canal with surrounding erythema, tenderness, and mild edema.  No edema of L preauricular area.  I&D performed.   Wt Readings from Last 3 Encounters:  11/23/19 (!) 267 lb (121.1 kg)  11/08/19 268 lb (121.6 kg)  09/20/19 275 lb (124.7 kg)    Lab Results  Component Value Date   WBC 8.9 11/08/2019   HGB 15.3 11/08/2019   HCT 45.4 11/08/2019   PLT 399 11/08/2019   GLUCOSE 171 (H) 11/08/2019   CHOL 156 03/03/2018   TRIG 188 (H) 03/03/2018   HDL 43 03/03/2018   LDLCALC 75 03/03/2018   ALT 14 03/03/2018   AST 17 03/03/2018   NA 140 11/08/2019   K 4.5 11/08/2019   CL 103 11/08/2019   CREATININE 0.70 11/08/2019   BUN 12 11/08/2019   CO2 21 11/08/2019   TSH 2.020 03/03/2018   HGBA1C 8.5 (H) 03/11/2018   MICROALBUR 1.8 06/15/2017  Incision and Drainage Procedure Note  Pre-operative Diagnosis: abscess  Post-operative Diagnosis: same  Anesthesia: 1% plain lidocaine  Procedure Details  The procedure, risks and complications have been discussed in detail (including, but not limited to airway compromise, infection, bleeding) with the patient, and the patient has signed consent to the procedure.  The skin was sterilely prepped and draped over the affected area in the usual fashion. After adequate local anesthesia, I&D with a #11 blade was performed on the left concha cavum/base of outer ear canal. Purulent drainage: present The patient was observed until  stable.  EBL: minimal cc's  Condition: Tolerated procedure well and Stable   Complications: none.   Assessment/Plan:  Abscess of ear canal, left  -Consent obtained.  I&D performed.  Pt tolerated procedure well. -discussed supportive care: warm compresses, NSAIDs or Tylenol prn for pain. -For continued or worsening symptoms will place referral to ENT - Plan: amoxicillin-clavulanate (AUGMENTIN) 875-125 MG tablet  Insulin-dependent type 2 diabetes, uncontrolled -Patient encouraged to make lifestyle modifications/healthier choices -Continue current meds -Continue follow-up with endocrinology  F/u prn  Abbe Amsterdam, MD

## 2019-11-23 NOTE — Patient Instructions (Signed)
Skin Abscess  A skin abscess is an infected area on or under your skin that contains a collection of pus and other material. An abscess may also be called a furuncle, carbuncle, or boil. An abscess can occur in or on almost any part of your body. Some abscesses break open (rupture) on their own. Most continue to get worse unless they are treated. The infection can spread deeper into the body and eventually into your blood, which can make you feel ill. Treatment usually involves draining the abscess. What are the causes? An abscess occurs when germs, like bacteria, pass through your skin and cause an infection. This may be caused by:  A scrape or cut on your skin.  A puncture wound through your skin, including a needle injection or insect bite.  Blocked oil or sweat glands.  Blocked and infected hair follicles.  A cyst that forms beneath your skin (sebaceous cyst) and becomes infected. What increases the risk? This condition is more likely to develop in people who:  Have a weak body defense system (immune system).  Have diabetes.  Have dry and irritated skin.  Get frequent injections or use illegal IV drugs.  Have a foreign body in a wound, such as a splinter.  Have problems with their lymph system or veins. What are the signs or symptoms? Symptoms of this condition include:  A painful, firm bump under the skin.  A bump with pus at the top. This may break through the skin and drain. Other symptoms include:  Redness surrounding the abscess site.  Warmth.  Swelling of the lymph nodes (glands) near the abscess.  Tenderness.  A sore on the skin. How is this diagnosed? This condition may be diagnosed based on:  A physical exam.  Your medical history.  A sample of pus. This may be used to find out what is causing the infection.  Blood tests.  Imaging tests, such as an ultrasound, CT scan, or MRI. How is this treated? A small abscess that drains on its own may  not need treatment. Treatment for larger abscesses may include:  Moist heat or heat pack applied to the area several times a day.  A procedure to drain the abscess (incision and drainage).  Antibiotic medicines. For a severe abscess, you may first get antibiotics through an IV and then change to antibiotics by mouth. Follow these instructions at home: Medicines   Take over-the-counter and prescription medicines only as told by your health care provider.  If you were prescribed an antibiotic medicine, take it as told by your health care provider. Do not stop taking the antibiotic even if you start to feel better. Abscess care   If you have an abscess that has not drained, apply heat to the affected area. Use the heat source that your health care provider recommends, such as a moist heat pack or a heating pad. ? Place a towel between your skin and the heat source. ? Leave the heat on for 20-30 minutes. ? Remove the heat if your skin turns bright red. This is especially important if you are unable to feel pain, heat, or cold. You may have a greater risk of getting burned.  Follow instructions from your health care provider about how to take care of your abscess. Make sure you: ? Cover the abscess with a bandage (dressing). ? Change your dressing or gauze as told by your health care provider. ? Wash your hands with soap and water before you change the   dressing or gauze. If soap and water are not available, use hand sanitizer.  Check your abscess every day for signs of a worsening infection. Check for: ? More redness, swelling, or pain. ? More fluid or blood. ? Warmth. ? More pus or a bad smell. General instructions  To avoid spreading the infection: ? Do not share personal care items, towels, or hot tubs with others. ? Avoid making skin contact with other people.  Keep all follow-up visits as told by your health care provider. This is important. Contact a health care provider if  you have:  More redness, swelling, or pain around your abscess.  More fluid or blood coming from your abscess.  Warm skin around your abscess.  More pus or a bad smell coming from your abscess.  A fever.  Muscle aches.  Chills or a general ill feeling. Get help right away if you:  Have severe pain.  See red streaks on your skin spreading away from the abscess. Summary  A skin abscess is an infected area on or under your skin that contains a collection of pus and other material.  A small abscess that drains on its own may not need treatment.  Treatment for larger abscesses may include having a procedure to drain the abscess and taking an antibiotic. This information is not intended to replace advice given to you by your health care provider. Make sure you discuss any questions you have with your health care provider. Document Revised: 08/04/2018 Document Reviewed: 05/27/2017 Elsevier Patient Education  2020 Elsevier Inc.  

## 2019-11-25 ENCOUNTER — Other Ambulatory Visit: Payer: Self-pay | Admitting: Internal Medicine

## 2019-11-25 DIAGNOSIS — J4520 Mild intermittent asthma, uncomplicated: Secondary | ICD-10-CM

## 2019-11-25 DIAGNOSIS — Z760 Encounter for issue of repeat prescription: Secondary | ICD-10-CM

## 2020-01-07 ENCOUNTER — Other Ambulatory Visit: Payer: Self-pay | Admitting: Family Medicine

## 2020-01-07 DIAGNOSIS — F418 Other specified anxiety disorders: Secondary | ICD-10-CM

## 2020-01-07 DIAGNOSIS — R519 Headache, unspecified: Secondary | ICD-10-CM | POA: Diagnosis not present

## 2020-01-07 DIAGNOSIS — Z20822 Contact with and (suspected) exposure to covid-19: Secondary | ICD-10-CM | POA: Diagnosis not present

## 2020-01-07 DIAGNOSIS — Z9189 Other specified personal risk factors, not elsewhere classified: Secondary | ICD-10-CM | POA: Diagnosis not present

## 2020-01-07 DIAGNOSIS — J329 Chronic sinusitis, unspecified: Secondary | ICD-10-CM | POA: Diagnosis not present

## 2020-01-07 DIAGNOSIS — R6889 Other general symptoms and signs: Secondary | ICD-10-CM | POA: Diagnosis not present

## 2020-02-03 ENCOUNTER — Other Ambulatory Visit: Payer: Self-pay | Admitting: Family Medicine

## 2020-02-03 DIAGNOSIS — E1169 Type 2 diabetes mellitus with other specified complication: Secondary | ICD-10-CM

## 2020-02-14 DIAGNOSIS — E785 Hyperlipidemia, unspecified: Secondary | ICD-10-CM | POA: Diagnosis not present

## 2020-02-14 DIAGNOSIS — I10 Essential (primary) hypertension: Secondary | ICD-10-CM | POA: Diagnosis not present

## 2020-02-14 DIAGNOSIS — E1165 Type 2 diabetes mellitus with hyperglycemia: Secondary | ICD-10-CM | POA: Diagnosis not present

## 2020-02-28 ENCOUNTER — Other Ambulatory Visit: Payer: Self-pay | Admitting: Family Medicine

## 2020-02-28 DIAGNOSIS — E1169 Type 2 diabetes mellitus with other specified complication: Secondary | ICD-10-CM

## 2020-04-03 ENCOUNTER — Other Ambulatory Visit: Payer: Self-pay | Admitting: Family Medicine

## 2020-04-03 DIAGNOSIS — E1169 Type 2 diabetes mellitus with other specified complication: Secondary | ICD-10-CM

## 2020-04-03 DIAGNOSIS — E785 Hyperlipidemia, unspecified: Secondary | ICD-10-CM

## 2020-04-09 ENCOUNTER — Ambulatory Visit: Payer: BC Managed Care – PPO

## 2020-04-11 ENCOUNTER — Ambulatory Visit: Payer: BC Managed Care – PPO | Attending: Internal Medicine

## 2020-04-11 ENCOUNTER — Ambulatory Visit: Payer: BC Managed Care – PPO

## 2020-04-11 DIAGNOSIS — Z23 Encounter for immunization: Secondary | ICD-10-CM

## 2020-04-11 NOTE — Progress Notes (Signed)
   Covid-19 Vaccination Clinic  Name:  JILLIANE KAZANJIAN    MRN: [id number] DOB: 12-Dec-1965  04/11/2020  Ms. Boykin-Ferguson was observed post Covid-19 immunization for 15 minutes without incident. She was provided with Vaccine Information Sheet and instruction to access the V-Safe system.   Ms. Cotta was instructed to call 911 with any severe reactions post vaccine: Marland Kitchen Difficulty breathing  . Swelling of face and throat  . A fast heartbeat  . A bad rash all over body  . Dizziness and weakness   Immunizations Administered    Name Date Dose VIS Date Route   Pfizer COVID-19 Vaccine 04/11/2020  4:55 PM 0.3 mL 02/14/2020 Intramuscular   Manufacturer: [employer], [employer]   Lot: [id number]   NDC: [address]-9

## 2020-05-21 ENCOUNTER — Other Ambulatory Visit: Payer: Self-pay | Admitting: Family Medicine

## 2020-05-21 DIAGNOSIS — E785 Hyperlipidemia, unspecified: Secondary | ICD-10-CM

## 2020-05-21 DIAGNOSIS — E1169 Type 2 diabetes mellitus with other specified complication: Secondary | ICD-10-CM

## 2020-05-21 DIAGNOSIS — F418 Other specified anxiety disorders: Secondary | ICD-10-CM

## 2020-07-02 ENCOUNTER — Other Ambulatory Visit: Payer: BC Managed Care – PPO

## 2020-07-02 DIAGNOSIS — Z20822 Contact with and (suspected) exposure to covid-19: Secondary | ICD-10-CM

## 2020-07-03 LAB — NOVEL CORONAVIRUS, NAA: SARS-CoV-2, NAA: NOT DETECTED

## 2020-07-03 LAB — SARS-COV-2, NAA 2 DAY TAT

## 2020-07-08 ENCOUNTER — Encounter: Payer: Self-pay | Admitting: Family Medicine

## 2020-07-08 ENCOUNTER — Other Ambulatory Visit: Payer: Self-pay

## 2020-07-08 ENCOUNTER — Ambulatory Visit (INDEPENDENT_AMBULATORY_CARE_PROVIDER_SITE_OTHER): Payer: BC Managed Care – PPO | Admitting: Family Medicine

## 2020-07-08 VITALS — BP 100/62 | HR 87 | Temp 98.7°F | Ht 65.0 in | Wt 267.0 lb

## 2020-07-08 DIAGNOSIS — L0291 Cutaneous abscess, unspecified: Secondary | ICD-10-CM

## 2020-07-08 DIAGNOSIS — E1165 Type 2 diabetes mellitus with hyperglycemia: Secondary | ICD-10-CM

## 2020-07-08 DIAGNOSIS — Z794 Long term (current) use of insulin: Secondary | ICD-10-CM | POA: Diagnosis not present

## 2020-07-08 DIAGNOSIS — IMO0002 Reserved for concepts with insufficient information to code with codable children: Secondary | ICD-10-CM

## 2020-07-08 MED ORDER — SULFAMETHOXAZOLE-TRIMETHOPRIM 800-160 MG PO TABS: 1.0000 | ORAL_TABLET | Freq: Two times a day (BID) | ORAL | 0 refills | Status: AC

## 2020-07-08 NOTE — Patient Instructions (Signed)
Skin Abscess  A skin abscess is an infected area on or under your skin that contains a collection of pus and other material. An abscess may also be called a furuncle, carbuncle, or boil. An abscess can occur in or on almost any part of your body. Some abscesses break open (rupture) on their own. Most continue to get worse unless they are treated. The infection can spread deeper into the body and eventually into your blood, which can make you feel ill. Treatment usually involves draining the abscess. What are the causes? An abscess occurs when germs, like bacteria, pass through your skin and cause an infection. This may be caused by:  A scrape or cut on your skin.  A puncture wound through your skin, including a needle injection or insect bite.  Blocked oil or sweat glands.  Blocked and infected hair follicles.  A cyst that forms beneath your skin (sebaceous cyst) and becomes infected. What increases the risk? This condition is more likely to develop in people who:  Have a weak body defense system (immune system).  Have diabetes.  Have dry and irritated skin.  Get frequent injections or use illegal IV drugs.  Have a foreign body in a wound, such as a splinter.  Have problems with their lymph system or veins. What are the signs or symptoms? Symptoms of this condition include:  A painful, firm bump under the skin.  A bump with pus at the top. This may break through the skin and drain. Other symptoms include:  Redness surrounding the abscess site.  Warmth.  Swelling of the lymph nodes (glands) near the abscess.  Tenderness.  A sore on the skin. How is this diagnosed? This condition may be diagnosed based on:  A physical exam.  Your medical history.  A sample of pus. This may be used to find out what is causing the infection.  Blood tests.  Imaging tests, such as an ultrasound, CT scan, or MRI. How is this treated? A small abscess that drains on its own may not  need treatment. Treatment for larger abscesses may include:  Moist heat or heat pack applied to the area several times a day.  A procedure to drain the abscess (incision and drainage).  Antibiotic medicines. For a severe abscess, you may first get antibiotics through an IV and then change to antibiotics by mouth. Follow these instructions at home: Medicines  Take over-the-counter and prescription medicines only as told by your health care provider.  If you were prescribed an antibiotic medicine, take it as told by your health care provider. Do not stop taking the antibiotic even if you start to feel better.   Abscess care  If you have an abscess that has not drained, apply heat to the affected area. Use the heat source that your health care provider recommends, such as a moist heat pack or a heating pad. ? Place a towel between your skin and the heat source. ? Leave the heat on for 20-30 minutes. ? Remove the heat if your skin turns bright red. This is especially important if you are unable to feel pain, heat, or cold. You may have a greater risk of getting burned.  Follow instructions from your health care provider about how to take care of your abscess. Make sure you: ? Cover the abscess with a bandage (dressing). ? Change your dressing or gauze as told by your health care provider. ? Wash your hands with soap and water before you change the   dressing or gauze. If soap and water are not available, use hand sanitizer.  Check your abscess every day for signs of a worsening infection. Check for: ? More redness, swelling, or pain. ? More fluid or blood. ? Warmth. ? More pus or a bad smell.   General instructions  To avoid spreading the infection: ? Do not share personal care items, towels, or hot tubs with others. ? Avoid making skin contact with other people.  Keep all follow-up visits as told by your health care provider. This is important. Contact a health care provider if you  have:  More redness, swelling, or pain around your abscess.  More fluid or blood coming from your abscess.  Warm skin around your abscess.  More pus or a bad smell coming from your abscess.  A fever.  Muscle aches.  Chills or a general ill feeling. Get help right away if you:  Have severe pain.  See red streaks on your skin spreading away from the abscess. Summary  A skin abscess is an infected area on or under your skin that contains a collection of pus and other material.  A small abscess that drains on its own may not need treatment.  Treatment for larger abscesses may include having a procedure to drain the abscess and taking an antibiotic. This information is not intended to replace advice given to you by your health care provider. Make sure you discuss any questions you have with your health care provider. Document Revised: 08/04/2018 Document Reviewed: 05/27/2017 Elsevier Patient Education  2021 Elsevier Inc.  

## 2020-07-08 NOTE — Progress Notes (Signed)
Subjective:    Patient ID: Heather Myers, female    DOB: 05-24-1965, 55 y.o.   MRN: [id number]  Chief Complaint  Patient presents with  . Recurrent Skin Infections    Boil on left hip, mixed drainage blood and pus/ painful, had some fever last week/ ongoing for about 3 weeks its not healing     HPI Patient is a 55 year old female with past medical history significant for anxiety, arthritis, asthma, DM 2, obesity, HLD, HTN, allergies who is followed by Dr. [place] and seen for acute concern.  Pt with bump in left groin present x3 weeks.  Area painful, draining purulent fluid.  Was red last week.  Patient with history of diabetes, fasting a.m. blood sugar 101-110, 2hr postprandial blood sugars 150-170.  Past Medical History:  Diagnosis Date  . Allergic rhinitis   . Anemia   . Anxiety   . Arthritis   . Asthma, mild intermittent 12/19/2015  . Complication of anesthesia    difficulty waking up from anesthesia  . Depression   . Diabetes mellitus type 2 in obese (HCC)   . Echocardiogram shows left ventricular diastolic dysfunction   . Fluid retention   . Hyperlipidemia   . Hypertension   . Morbid obesity (HCC)   . Pneumonia    history of  . Sleep apnea    mild sleep, no CPAP needed at this time    Allergies  Allergen Reactions  . Ramipril Shortness Of Breath    ROS General: Denies fever, chills, night sweats, changes in weight, changes in appetite HEENT: Denies headaches, ear pain, changes in vision, rhinorrhea, sore throat CV: Denies CP, palpitations, SOB, orthopnea Pulm: Denies SOB, cough, wheezing GI: Denies abdominal pain, nausea, vomiting, diarrhea, constipation GU: Denies dysuria, hematuria, frequency, vaginal discharge Msk: Denies muscle cramps, joint pains Neuro: Denies weakness, numbness, tingling Skin: Denies rashes, bruising   +boil Psych: Denies depression, anxiety, hallucinations     Objective:    Blood pressure 100/62, pulse 87, temperature 98.7 F  (37.1 C), temperature source Oral, height 5\' 5"  (1.651 m), weight 267 lb (121.1 kg), SpO2 98 %.  Gen. Pleasant, well-nourished, in no distress, normal affect   HEENT: [place]/AT, face symmetric, conjunctiva clear, no scleral icterus, PERRLA, EOMI, nares patent without drainage Lungs: no accessory muscle use Cardiovascular: RRR, no peripheral edema Neuro:  A&Ox3, CN II-XII intact, normal gait Skin:  Warm, dry.  L lateral groin underneath panus with a 1.5 cm slightly raised area TTP with dermal abrasion, granulation tissue, and purulent drainage.  No increased warmth or induration.   Wt Readings from Last 3 Encounters:  07/08/20 267 lb (121.1 kg)  11/23/19 (!) 267 lb (121.1 kg)  11/08/19 268 lb (121.6 kg)    Lab Results  Component Value Date   WBC 8.9 11/08/2019   HGB 15.3 11/08/2019   HCT 45.4 11/08/2019   PLT 399 11/08/2019   GLUCOSE 171 (H) 11/08/2019   CHOL 156 03/03/2018   TRIG 188 (H) 03/03/2018   HDL 43 03/03/2018   LDLCALC 75 03/03/2018   ALT 14 03/03/2018   AST 17 03/03/2018   NA 140 11/08/2019   K 4.5 11/08/2019   CL 103 11/08/2019   CREATININE 0.70 11/08/2019   BUN 12 11/08/2019   CO2 21 11/08/2019   TSH 2.020 03/03/2018   HGBA1C 8.5 (H) 03/11/2018   MICROALBUR 1.8 06/15/2017    Assessment/Plan:  Abscess   -Area is draining. -Patient advised to apply warm compresses several times per day  to promote drainage -Okay to take Tylenol as needed -We will start Bactrim -Discussed the importance of glycemic control -For worsening or continued symptoms I&D and change antibiotic -Given handout - Plan: sulfamethoxazole-trimethoprim (BACTRIM DS) 800-160 MG tablet  Insulin-dependent diabetes, uncontrolled -Discussed the importance of lifestyle modifications and glycemic control to promote healing -Continue current medications  F/u as needed  Abbe Amsterdam, MD

## 2020-08-16 ENCOUNTER — Other Ambulatory Visit: Payer: Self-pay | Admitting: Nurse Practitioner

## 2020-08-16 ENCOUNTER — Telehealth: Payer: Self-pay | Admitting: *Deleted

## 2020-08-16 DIAGNOSIS — B009 Herpesviral infection, unspecified: Secondary | ICD-10-CM

## 2020-08-16 MED ORDER — LIDOCAINE 5 % EX OINT: 1.0000 "application " | TOPICAL_OINTMENT | Freq: Three times a day (TID) | CUTANEOUS | 0 refills | Status: DC

## 2020-08-16 MED ORDER — VALACYCLOVIR HCL 500 MG PO TABS: 500.0000 mg | ORAL_TABLET | Freq: Two times a day (BID) | ORAL | 0 refills | Status: DC

## 2020-08-16 NOTE — Telephone Encounter (Signed)
(  Dr.Jertson patient)Patient called c/o HSV-2 outbreak rare outbreaks, over due for annual exam. Sent to appointments to schedule. Patient is requesting oral and topically cream sent to pharmacy to help with outbreak for now. Please advise

## 2020-08-16 NOTE — Progress Notes (Signed)
Pt requesting medication for HSV. No record of this medication or hx of hsv on chart. Called patient and she stated last outbreak about 10 years ago. She has used lidocaine and Valtrex in past. Rx sent to pharmacy. Annual exam was scheduled 10/2020 (first available) [place], [facility]

## 2020-08-19 DIAGNOSIS — Z794 Long term (current) use of insulin: Secondary | ICD-10-CM | POA: Diagnosis not present

## 2020-08-19 DIAGNOSIS — E1165 Type 2 diabetes mellitus with hyperglycemia: Secondary | ICD-10-CM | POA: Diagnosis not present

## 2020-10-22 ENCOUNTER — Other Ambulatory Visit: Payer: Self-pay

## 2020-10-22 MED ORDER — FUROSEMIDE 20 MG PO TABS: 20.0000 mg | ORAL_TABLET | Freq: Every day | ORAL | 0 refills | Status: DC

## 2020-10-23 ENCOUNTER — Other Ambulatory Visit: Payer: Self-pay

## 2020-10-23 MED ORDER — SPIRONOLACTONE 25 MG PO TABS: 25.0000 mg | ORAL_TABLET | Freq: Two times a day (BID) | ORAL | 0 refills | Status: DC

## 2020-10-23 MED ORDER — METOPROLOL SUCCINATE ER 50 MG PO TB24: ORAL_TABLET | ORAL | 0 refills | Status: DC

## 2020-10-23 MED ORDER — METOPROLOL SUCCINATE ER 25 MG PO TB24: ORAL_TABLET | ORAL | 0 refills | Status: DC

## 2020-10-23 NOTE — Telephone Encounter (Signed)
Pt's medications were sent to pt's pharmacy as requested. Confirmation received.  

## 2020-11-17 ENCOUNTER — Other Ambulatory Visit: Payer: Self-pay | Admitting: Interventional Cardiology

## 2020-11-19 NOTE — Progress Notes (Deleted)
55 y.o. Heather Myers Legally Separated Black or African American Not Hispanic or Latino female here for annual exam.      No LMP recorded. (Menstrual status: IUD).          Sexually active: {yes no:314532}  The current method of family planning is IUD.   Mirena placed 8/18 Exercising: {yes no:314532}  {types:19826} Smoker:  {YES NO:22349}  Health Maintenance: Pap:  03/2016 WNL per patient  History of abnormal Pap:  {YES NO:22349} MMG:  none on file  BMD:   none on file  Colonoscopy: none on file  TDaP:  *** Gardasil: NA   reports that she has never smoked. She has never used smokeless tobacco. She reports previous alcohol use. She reports that she does not use drugs.  Past Medical History:  Diagnosis Date   Allergic rhinitis    Anemia    Anxiety    Arthritis    Asthma, mild intermittent 1/47/8295   Complication of anesthesia    difficulty waking up from anesthesia   Depression    Diabetes mellitus type 2 in obese [facility])    Echocardiogram shows left ventricular diastolic dysfunction    Fluid retention    Hyperlipidemia    Hypertension    Morbid obesity ([name])    Pneumonia    history of   Sleep apnea    mild sleep, no CPAP needed at this time    Past Surgical History:  Procedure Laterality Date   ABDOMINAL SURGERY     chronic abd wall abscess   CESAREAN SECTION     x's 2   CHOLECYSTECTOMY     DILATATION & CURETTAGE/HYSTEROSCOPY WITH MYOSURE N/A 12/08/2016   Procedure: DILATATION & CURETTAGE/HYSTEROSCOPY WITH MYOSURE;  Surgeon: Salvadore Dom, MD;  Location: [facility];  Service: Gynecology;  Laterality: N/A;   INTRAUTERINE DEVICE (IUD) INSERTION N/A 12/08/2016   Procedure: MIRENA INTRAUTERINE DEVICE (IUD) INSERTION (IUD FROM OFFICE);  Surgeon: Salvadore Dom, MD;  Location: [facility];  Service: Gynecology;  Laterality: N/A;   KNEE ARTHROSCOPY Right    LIPOMA EXCISION  08/03/2011   Procedure: EXCISION LIPOMA;  Surgeon: Harl Bowie, MD;  Location: [facility];  Service: General;  Laterality: Right;  wide excision chronic abscess Right lower abdominal wall   OOPHORECTOMY     R [facility] at [facility]      Current Outpatient Medications  Medication Sig Dispense Refill   albuterol (VENTOLIN HFA) 108 (90 Base) MCG/ACT inhaler TAKE 2 PUFFS BY MOUTH EVERY 6 HOURS AS NEEDED FOR WHEEZE OR SHORTNESS OF BREATH 8.5 g 1   amoxicillin-clavulanate (AUGMENTIN) 875-125 MG tablet Take 1 tablet by mouth 2 (two) times daily. (Patient not taking: Reported on 07/08/2020) 20 tablet 0   atorvastatin (LIPITOR) 40 MG tablet TAKE 1 TABLET BY MOUTH DAILY. NEEDS OFFICE VISIT AND LABS 90 tablet 1   BAYER MICROLET LANCETS lancets Use as instructed to check sugar up to 6 times daily 600 each 5   Blood Glucose Monitoring Suppl (BAYER CONTOUR MONITOR) w/Device KIT Use to check sugar daily 1 each 0   citalopram (CELEXA) 40 MG tablet TAKE 1 TABLET BY MOUTH EVERY DAY 90 tablet 1   furosemide (LASIX) 20 MG tablet Take 1 tablet (20 mg total) by mouth daily. Patient needs appointment for any future refills. Please call office at [phone] to schedule appointment. 2nd attempt. 30 tablet 0   glucose blood (BAYER CONTOUR TEST) test strip Use as instructed to check sugar  6 times daily 600 each 5   insulin aspart (NOVOLOG FLEXPEN) 100 UNIT/ML FlexPen INJECT 25-30 UNITS 3 TIMES A DAY *15 MINUTES BEFORE MEALS* 24 mL 1   Insulin Pen Needle 31G X 5 MM MISC Use 4x a day 400 each 3   INVOKANA 300 MG TABS tablet TAKE 1 TABLET BY MOUTH EVERY DAY 90 tablet 3   lidocaine (XYLOCAINE) 5 % ointment Apply 1 application topically 3 (three) times daily. 1.25 g 0   metFORMIN (GLUCOPHAGE-XR) 500 MG 24 hr tablet TAKE 2 TABLETS BY MOUTH TWICE A DAY 360 tablet 0   metoprolol succinate (TOPROL-XL) 25 MG 24 hr tablet TAKE 1 TABLET BY MOUTH DAILY. TAKE WITH 50 MG FOR TOTAL OF 75 MG. 90 tablet 0   metoprolol succinate (TOPROL-XL) 50 MG 24 hr tablet TAKE 1 TABLET BY MOUTH DAILY WITH (25 MG)  TOTAL 75 MG.TAKE WITH OR IMMEDIATELY FOLLOWING A MEAL. 90 tablet 0   Multiple Vitamins-Minerals (MULTIVITAMIN WITH MINERALS) tablet Take 1 tablet by mouth daily.     Semaglutide, 1 MG/DOSE, (OZEMPIC, 1 MG/DOSE,) 4 MG/3ML SOPN Inject into the skin.     spironolactone (ALDACTONE) 25 MG tablet Take 1 tablet (25 mg total) by mouth 2 (two) times daily. Please make yearly appt with Dr. Tamala Julian for July 2022 for future refills. Thank you 1st attempt 180 tablet 0   TOUJEO SOLOSTAR 300 UNIT/ML SOPN INJECT 75 UNITS INTO THE SKIN DAILY 22.5 mL 2   valACYclovir (VALTREX) 500 MG tablet Take 1 tablet (500 mg total) by mouth 2 (two) times daily. X 3-5 days 30 tablet 0   No current facility-administered medications for this visit.    Family History  Problem Relation Age of Onset   Diabetes Mother    Hypertension Mother    Other Mother        renal failure   Diabetes Other    Hypertension Father    Cancer Father     Review of Systems  Exam:   There were no vitals taken for this visit.  Weight change: _0 @ Height:      Ht Readings from Last 3 Encounters:  07/08/20 _1  (1.651 m)  11/08/19 _2  (1.651 m)  09/20/19 _3  (1.651 m)    General appearance: alert, cooperative and appears stated age Head: Normocephalic, without obvious abnormality, atraumatic Neck: no adenopathy, supple, symmetrical, trachea midline and thyroid {CHL AMB PHY EX THYROID NORM [phone]} Lungs: clear to auscultation bilaterally Cardiovascular: regular rate and rhythm Breasts: {Exam; breast:13139} Abdomen: soft, non-tender; non distended,  no masses,  no organomegaly Extremities: extremities normal, atraumatic, no cyanosis or edema Skin: Skin color, texture, turgor normal. No rashes or lesions Lymph nodes: Cervical, supraclavicular, and axillary nodes normal. No abnormal inguinal nodes palpated Neurologic: Grossly normal   Pelvic: External genitalia:  no lesions              Urethra:  normal  appearing urethra with no masses, tenderness or lesions              Bartholins and Skenes: normal                 Vagina: normal appearing vagina with normal color and discharge, no lesions              Cervix: {CHL AMB PHY EX CERVIX NORM DEFAULT:[phone]}               Bimanual Exam:  Uterus:  {CHL AMB PHY EX UTERUS NORM DEFAULT:[phone]}  Adnexa: {CHL AMB PHY EX ADNEXA NO MASS [phone]}               Rectovaginal: Confirms               Anus:  normal sphincter tone, no lesions  *** chaperoned for the exam.  A:  Well Woman with normal exam  P:

## 2020-11-21 ENCOUNTER — Ambulatory Visit: Payer: BC Managed Care – PPO | Admitting: Obstetrics and Gynecology

## 2020-11-21 DIAGNOSIS — Z0289 Encounter for other administrative examinations: Secondary | ICD-10-CM

## 2020-11-22 ENCOUNTER — Encounter: Payer: Self-pay | Admitting: Obstetrics and Gynecology

## 2020-11-22 ENCOUNTER — Other Ambulatory Visit (HOSPITAL_COMMUNITY)
Admission: RE | Admit: 2020-11-22 | Discharge: 2020-11-22 | Disposition: A | Discharge status: Home / Self Care | Payer: BC Managed Care – PPO | Source: Ambulatory Visit | Attending: Obstetrics and Gynecology | Admitting: Obstetrics and Gynecology

## 2020-11-22 ENCOUNTER — Ambulatory Visit (INDEPENDENT_AMBULATORY_CARE_PROVIDER_SITE_OTHER): Payer: BC Managed Care – PPO | Admitting: Obstetrics and Gynecology

## 2020-11-22 ENCOUNTER — Other Ambulatory Visit: Payer: Self-pay

## 2020-11-22 VITALS — BP 116/64 | HR 88 | Ht 65.0 in | Wt 261.0 lb

## 2020-11-22 DIAGNOSIS — N952 Postmenopausal atrophic vaginitis: Secondary | ICD-10-CM

## 2020-11-22 DIAGNOSIS — Z1211 Encounter for screening for malignant neoplasm of colon: Secondary | ICD-10-CM | POA: Diagnosis not present

## 2020-11-22 DIAGNOSIS — N912 Amenorrhea, unspecified: Secondary | ICD-10-CM

## 2020-11-22 DIAGNOSIS — Z113 Encounter for screening for infections with a predominantly sexual mode of transmission: Secondary | ICD-10-CM

## 2020-11-22 DIAGNOSIS — Z30431 Encounter for routine checking of intrauterine contraceptive device: Secondary | ICD-10-CM

## 2020-11-22 DIAGNOSIS — Z124 Encounter for screening for malignant neoplasm of cervix: Secondary | ICD-10-CM | POA: Insufficient documentation

## 2020-11-22 DIAGNOSIS — N941 Unspecified dyspareunia: Secondary | ICD-10-CM

## 2020-11-22 DIAGNOSIS — L680 Hirsutism: Secondary | ICD-10-CM

## 2020-11-22 DIAGNOSIS — B009 Herpesviral infection, unspecified: Secondary | ICD-10-CM

## 2020-11-22 DIAGNOSIS — Z01419 Encounter for gynecological examination (general) (routine) without abnormal findings: Secondary | ICD-10-CM

## 2020-11-22 MED ORDER — ESTRADIOL 10 MCG VA TABS: ORAL_TABLET | VAGINAL | 4 refills | Status: DC

## 2020-11-22 MED ORDER — VALACYCLOVIR HCL 500 MG PO TABS: ORAL_TABLET | ORAL | 4 refills | Status: AC

## 2020-11-22 NOTE — Patient Instructions (Addendum)
Try uberlube for lubrication  EXERCISE   We recommended that you start or continue a regular exercise program for good health. Physical activity is anything that gets your body moving, some is better than none. The CDC recommends 150 minutes per week of Moderate-Intensity Aerobic Activity and 2 or more days of Muscle Strengthening Activity.  Benefits of exercise are limitless: helps weight loss/weight maintenance, improves mood and energy, helps with depression and anxiety, improves sleep, tones and strengthens muscles, improves balance, improves bone density, protects from chronic conditions such as heart disease, high blood pressure and diabetes and so much more. To learn more visit: https://www.cdc.gov/physicalactivity/index.html  DIET: Good nutrition starts with a healthy diet of fruits, vegetables, whole grains, and lean protein sources. Drink plenty of water for hydration. Minimize empty calories, sodium, sweets. For more information about dietary recommendations visit: https://health.gov/our-work/nutrition-physical-activity/dietary-guidelines and https://www.myplate.gov/  ALCOHOL:  Women should limit their alcohol intake to no more than 7 drinks/beers/glasses of wine (combined, not each!) per week. Moderation of alcohol intake to this level decreases your risk of breast cancer and liver damage.  If you are concerned that you may have a problem, or your friends have told you they are concerned about your drinking, there are many resources to help. A well-known program that is free, effective, and available to all people all over the nation is Alcoholics Anonymous.  Check out this site to learn more: https://www.aa.org/   CALCIUM AND VITAMIN D:  Adequate intake of calcium and Vitamin D are recommended for bone health.  You should be getting between 1000-1200 mg of calcium and 800 units of Vitamin D daily between diet and supplements  PAP SMEARS:  Pap smears, to check for cervical cancer or  precancers,  have traditionally been done yearly, scientific advances have shown that most women can have pap smears less often.  However, every woman still should have a physical exam from her gynecologist every year. It will include a breast check, inspection of the vulva and vagina to check for abnormal growths or skin changes, a visual exam of the cervix, and then an exam to evaluate the size and shape of the uterus and ovaries. We will also provide age appropriate advice regarding health maintenance, like when you should have certain vaccines, screening for sexually transmitted diseases, bone density testing, colonoscopy, mammograms, etc.   MAMMOGRAMS:  All women over 40 years old should have a routine mammogram.   COLON CANCER SCREENING: Now recommend starting at age 45. At this time colonoscopy is not covered for routine screening until 50. There are take home tests that can be done between 45-49.   COLONOSCOPY:  Colonoscopy to screen for colon cancer is recommended for all women at age 50.  We know, you hate the idea of the prep.  We agree, BUT, having colon cancer and not knowing it is worse!!  Colon cancer so often starts as a polyp that can be seen and removed at colonscopy, which can quite literally save your life!  And if your first colonoscopy is normal and you have no family history of colon cancer, most women don't have to have it again for 10 years.  Once every ten years, you can do something that may end up saving your life, right?  We will be happy to help you get it scheduled when you are ready.  Be sure to check your insurance coverage so you understand how much it will cost.  It may be covered as a preventative service at no   cost, but you should check your particular policy.      Breast Self-Awareness Breast self-awareness means being familiar with how your breasts look and feel. It involves checking your breasts regularly and reporting any changes to your health care  provider. Practicing breast self-awareness is important. A change in your breasts can be a sign of a serious medical problem. Being familiar with how your breasts look and feel allows you to find any problems early, when treatment is more likely to be successful. All women should practice breast self-awareness, including women who have had breast implants. How to do a breast self-exam One way to learn what is normal for your breasts and whether your breasts are changing is to do a breast self-exam. To do a breast self-exam: Look for Changes  Remove all the clothing above your waist. Stand in front of a mirror in a room with good lighting. Put your hands on your hips. Push your hands firmly downward. Compare your breasts in the mirror. Look for differences between them (asymmetry), such as: Differences in shape. Differences in size. Puckers, dips, and bumps in one breast and not the other. Look at each breast for changes in your skin, such as: Redness. Scaly areas. Look for changes in your nipples, such as: Discharge. Bleeding. Dimpling. Redness. A change in position. Feel for Changes Carefully feel your breasts for lumps and changes. It is best to do this while lying on your back on the floor and again while sitting or standing in the shower or tub with soapy water on your skin. Feel each breast in the following way: Place the arm on the side of the breast you are examining above your head. Feel your breast with the other hand. Start in the nipple area and make  inch (2 cm) overlapping circles to feel your breast. Use the pads of your three middle fingers to do this. Apply light pressure, then medium pressure, then firm pressure. The light pressure will allow you to feel the tissue closest to the skin. The medium pressure will allow you to feel the tissue that is a little deeper. The firm pressure will allow you to feel the tissue close to the ribs. Continue the overlapping circles,  moving downward over the breast until you feel your ribs below your breast. Move one finger-width toward the center of the body. Continue to use the  inch (2 cm) overlapping circles to feel your breast as you move slowly up toward your collarbone. Continue the up and down exam using all three pressures until you reach your armpit.  Write Down What You Find  Write down what is normal for each breast and any changes that you find. Keep a written record with breast changes or normal findings for each breast. By writing this information down, you do not need to depend only on memory for size, tenderness, or location. Write down where you are in your menstrual cycle, if you are still menstruating. If you are having trouble noticing differences in your breasts, do not get discouraged. With time you will become more familiar with the variations in your breasts and more comfortable with the exam. How often should I examine my breasts? Examine your breasts every month. If you are breastfeeding, the best time to examine your breasts is after a feeding or after using a breast pump. If you menstruate, the best time to examine your breasts is 5-7 days after your period is over. During your period, your breasts are lumpier,   and it may be more difficult to notice changes. When should I see my health care provider? See your health care provider if you notice: A change in shape or size of your breasts or nipples. A change in the skin of your breast or nipples, such as a reddened or scaly area. Unusual discharge from your nipples. A lump or thick area that was not there before. Pain in your breasts. Anything that concerns you.  

## 2020-11-22 NOTE — Progress Notes (Signed)
55 y.o. [id number] Legally Separated Black or African American Not Hispanic or Latino female here for annual exam. Patient Is having vaginal dryness. She is also experiencing excessive facial hair. Patient is having pain with sex and some vaginal bleeding after sex.   In 8/18 the patient had a hysteroscopy, D&C, mirnea IUD insertion for AUB.  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  No cycles with the IUD. No vasomotor symptoms. She has entry dyspareunia, lubricant helps a little but not enough. Feels she is tearing a little and can have spots of blood. Same partner for 6 months. They use condoms. No deep dyspareunia.  She c/o hair growth on her chin and face, seems to be growing in faster. Used to wax every 2 weeks, now needs to do it daily.   No bowel or bladder c/o.   She has lost 50-60 lbs with medication.     No LMP recorded. (Menstrual status: IUD).          Sexually active: Yes.    The current method of family planning is IUD.    Exercising: No.  The patient does not participate in regular exercise at present. Smoker:  no  Health Maintenance: Pap:  2017 normal per patient  History of abnormal Pap:  yes MMG:  2015 WNL per patient  BMD:   Never  Colonoscopy: never  TDaP:  not sure  Gardasil: NA   reports that she has never smoked. She has never used smokeless tobacco. She reports previous alcohol use. She reports that she does not use drugs. Rare ETOH. She owns a mental health agency. Daughter is 63, Son is 11. Son is at [employer]. Daughter works for [employer]   Past Medical History:  Diagnosis Date   Allergic rhinitis    Anemia    Anxiety    Arthritis    Asthma, mild intermittent 2/69/4854   Complication of anesthesia    difficulty waking up from anesthesia   Depression    Diabetes mellitus type 2 in obese [facility])    Echocardiogram shows left ventricular diastolic dysfunction    Fluid retention    Hyperlipidemia    Hypertension    Morbid obesity ([place])    Pneumonia    history  of   Sleep apnea    mild sleep, no CPAP needed at this time    Past Surgical History:  Procedure Laterality Date   ABDOMINAL SURGERY     chronic abd wall abscess   CESAREAN SECTION     x's 2   CHOLECYSTECTOMY     DILATATION & CURETTAGE/HYSTEROSCOPY WITH MYOSURE N/A 12/08/2016   Procedure: DILATATION & CURETTAGE/HYSTEROSCOPY WITH MYOSURE;  Surgeon: Salvadore Dom, MD;  Location: [facility];  Service: Gynecology;  Laterality: N/A;   INTRAUTERINE DEVICE (IUD) INSERTION N/A 12/08/2016   Procedure: MIRENA INTRAUTERINE DEVICE (IUD) INSERTION (IUD FROM OFFICE);  Surgeon: Salvadore Dom, MD;  Location: [facility];  Service: Gynecology;  Laterality: N/A;   KNEE ARTHROSCOPY Right    LIPOMA EXCISION  08/03/2011   Procedure: EXCISION LIPOMA;  Surgeon: Harl Bowie, MD;  Location: [facility];  Service: General;  Laterality: Right;  wide excision chronic abscess Right lower abdominal wall   OOPHORECTOMY     R ovary removed at [facility]      Current Outpatient Medications  Medication Sig Dispense Refill   albuterol (VENTOLIN HFA) 108 (90 Base) MCG/ACT inhaler TAKE 2 PUFFS BY MOUTH EVERY 6 HOURS AS NEEDED FOR  WHEEZE OR SHORTNESS OF BREATH 8.5 g 1   amoxicillin-clavulanate (AUGMENTIN) 875-125 MG tablet Take 1 tablet by mouth 2 (two) times daily. (Patient not taking: Reported on 07/08/2020) 20 tablet 0   atorvastatin (LIPITOR) 40 MG tablet TAKE 1 TABLET BY MOUTH DAILY. NEEDS OFFICE VISIT AND LABS 90 tablet 1   BAYER MICROLET LANCETS lancets Use as instructed to check sugar up to 6 times daily 600 each 5   Blood Glucose Monitoring Suppl (BAYER CONTOUR MONITOR) w/Device KIT Use to check sugar daily 1 each 0   citalopram (CELEXA) 40 MG tablet TAKE 1 TABLET BY MOUTH EVERY DAY 90 tablet 1   furosemide (LASIX) 20 MG tablet Take 1 tablet (20 mg total) by mouth daily. Patient needs appointment for any future refills. Please call office at [phone] to schedule appointment. 2nd  attempt. 30 tablet 0   glucose blood (BAYER CONTOUR TEST) test strip Use as instructed to check sugar 6 times daily 600 each 5   insulin aspart (NOVOLOG FLEXPEN) 100 UNIT/ML FlexPen INJECT 25-30 UNITS 3 TIMES A DAY *15 MINUTES BEFORE MEALS* 24 mL 1   Insulin Pen Needle 31G X 5 MM MISC Use 4x a day 400 each 3   INVOKANA 300 MG TABS tablet TAKE 1 TABLET BY MOUTH EVERY DAY 90 tablet 3   lidocaine (XYLOCAINE) 5 % ointment Apply 1 application topically 3 (three) times daily. 1.25 g 0   metFORMIN (GLUCOPHAGE-XR) 500 MG 24 hr tablet TAKE 2 TABLETS BY MOUTH TWICE A DAY 360 tablet 0   metoprolol succinate (TOPROL-XL) 25 MG 24 hr tablet TAKE 1 TABLET BY MOUTH DAILY. TAKE WITH 50 MG FOR TOTAL OF 75 MG. 90 tablet 0   metoprolol succinate (TOPROL-XL) 50 MG 24 hr tablet TAKE 1 TABLET BY MOUTH DAILY WITH (25 MG) TOTAL 75 MG.TAKE WITH OR IMMEDIATELY FOLLOWING A MEAL. 90 tablet 0   Multiple Vitamins-Minerals (MULTIVITAMIN WITH MINERALS) tablet Take 1 tablet by mouth daily.     Semaglutide, 1 MG/DOSE, (OZEMPIC, 1 MG/DOSE,) 4 MG/3ML SOPN Inject into the skin.     spironolactone (ALDACTONE) 25 MG tablet Take 1 tablet (25 mg total) by mouth 2 (two) times daily. Please make yearly appt with Dr. Tamala Julian for July 2022 for future refills. Thank you 1st attempt 180 tablet 0   TOUJEO SOLOSTAR 300 UNIT/ML SOPN INJECT 75 UNITS INTO THE SKIN DAILY 22.5 mL 2   valACYclovir (VALTREX) 500 MG tablet Take 1 tablet (500 mg total) by mouth 2 (two) times daily. X 3-5 days 30 tablet 0   No current facility-administered medications for this visit.    Family History  Problem Relation Age of Onset   Diabetes Mother    Hypertension Mother    Other Mother        renal failure   Diabetes Other    Hypertension Father    Cancer Father     Review of Systems  Genitourinary:  Positive for vaginal bleeding and vaginal pain.  All other systems reviewed and are negative.  Exam:   There were no vitals taken for this visit.  Weight  change: '@WEIGHTCHANGE' @ Height:      116/64, P 88, wt 261, Ht 5'5", BMI 43.3 Ht Readings from Last 3 Encounters:  07/08/20 '5\' 5"'  (1.651 m)  11/08/19 '5\' 5"'  (1.651 m)  09/20/19 '5\' 5"'  (1.651 m)    General appearance: alert, cooperative and appears stated age Head: Normocephalic, without obvious abnormality, atraumatic Neck: no adenopathy, supple, symmetrical, trachea midline and thyroid  normal to inspection and palpation Lungs: clear to auscultation bilaterally Cardiovascular: regular rate and rhythm Breasts: normal appearance, no masses or tenderness Abdomen: soft, non-tender; non distended,  no masses,  no organomegaly Extremities: extremities normal, atraumatic, no cyanosis or edema Skin: Skin color, texture, turgor normal. No rashes or lesions Lymph nodes: Cervical, supraclavicular, and axillary nodes normal. No abnormal inguinal nodes palpated Neurologic: Grossly normal   Pelvic: External genitalia:  no lesions              Urethra:  normal appearing urethra with no masses, tenderness or lesions              Bartholins and Skenes: normal                 Vagina: atrophic appearing vagina with normal color and discharge, no lesions              Cervix: no lesions and IUD string 3-4 cm               Bimanual Exam:  Uterus:   no masses or tenderness              Adnexa: no mass, fullness, tenderness               Rectovaginal: Confirms               Anus:  normal sphincter tone, no lesions  Gae Dry chaperoned for the exam.  1. Well woman exam Discussed breast self exam Discussed calcium and vit D intake Mammogram due, # given to call  2. Screening for cervical cancer - Cytology - PAP with hpv/GC/CT/Trich  3. Colon cancer screening Reviewed options - Ambulatory referral to Gastroenterology  4. Hirsutism - 17-Hydroxyprogesterone - Testosterone, Total, LC/MS/MS  5. HSV (herpes simplex virus) infection Desires suppression - valACYclovir (VALTREX) 500 MG tablet; Take  one tablet daily, increase to one tablet po BID x 3 days prn outbreak  Dispense: 90 tablet; Refill: 4  6. Screening examination for STD (sexually transmitted disease) - RPR - HIV Antibody (routine testing w rflx) - Cytology - PAP  7. Dyspareunia, female Suspect from atrophy Will start vaginal estrogen Discussed types of vaginal lubrication   8. Vaginal atrophy - Follicle stimulating hormone - Estradiol 10 MCG TABS vaginal tablet; Place one tablet vaginally qhs x 1 week, then change to 2 x a week at hs.  Dispense: 24 tablet; Refill: 4  9. IUD check up Doing well, would not pull at this time  10. Amenorrhea - Follicle stimulating hormone

## 2020-11-25 LAB — CYTOLOGY - PAP
Adequacy: ABSENT
Chlamydia: NEGATIVE
Comment: NEGATIVE
Comment: NEGATIVE
Comment: NEGATIVE
Comment: NORMAL
Diagnosis: NEGATIVE
High risk HPV: NEGATIVE
Neisseria Gonorrhea: NEGATIVE
Trichomonas: NEGATIVE

## 2020-11-27 ENCOUNTER — Other Ambulatory Visit: Payer: Self-pay

## 2020-11-27 MED ORDER — FLUCONAZOLE 150 MG PO TABS: ORAL_TABLET | ORAL | 0 refills | Status: DC

## 2020-12-02 LAB — 17-HYDROXYPROGESTERONE: 17-OH-Progesterone, LC/MS/MS: 13 ng/dL

## 2020-12-02 LAB — TESTOSTERONE, TOTAL, LC/MS/MS: Testosterone, Total, LC-MS-MS: 7 ng/dL (ref 2–45)

## 2020-12-02 LAB — FOLLICLE STIMULATING HORMONE: FSH: 78.8 m[IU]/mL

## 2020-12-02 LAB — HIV ANTIBODY (ROUTINE TESTING W REFLEX): HIV 1&2 Ab, 4th Generation: NONREACTIVE

## 2020-12-02 LAB — RPR: RPR Ser Ql: NONREACTIVE

## 2020-12-11 ENCOUNTER — Other Ambulatory Visit: Payer: Self-pay | Admitting: Family Medicine

## 2020-12-11 DIAGNOSIS — E1169 Type 2 diabetes mellitus with other specified complication: Secondary | ICD-10-CM

## 2020-12-11 DIAGNOSIS — F418 Other specified anxiety disorders: Secondary | ICD-10-CM

## 2020-12-11 DIAGNOSIS — E785 Hyperlipidemia, unspecified: Secondary | ICD-10-CM

## 2020-12-17 DIAGNOSIS — Z20822 Contact with and (suspected) exposure to covid-19: Secondary | ICD-10-CM | POA: Diagnosis not present

## 2020-12-17 DIAGNOSIS — J069 Acute upper respiratory infection, unspecified: Secondary | ICD-10-CM | POA: Diagnosis not present

## 2020-12-17 DIAGNOSIS — J029 Acute pharyngitis, unspecified: Secondary | ICD-10-CM | POA: Diagnosis not present

## 2020-12-17 DIAGNOSIS — H10413 Chronic giant papillary conjunctivitis, bilateral: Secondary | ICD-10-CM | POA: Diagnosis not present

## 2021-01-01 ENCOUNTER — Other Ambulatory Visit: Payer: Self-pay | Admitting: Interventional Cardiology

## 2021-01-14 ENCOUNTER — Other Ambulatory Visit: Payer: Self-pay | Admitting: Interventional Cardiology

## 2021-01-25 ENCOUNTER — Other Ambulatory Visit: Payer: Self-pay | Admitting: Obstetrics and Gynecology

## 2021-01-25 ENCOUNTER — Other Ambulatory Visit: Payer: Self-pay | Admitting: Interventional Cardiology

## 2021-01-25 DIAGNOSIS — B009 Herpesviral infection, unspecified: Secondary | ICD-10-CM

## 2021-02-05 ENCOUNTER — Other Ambulatory Visit: Payer: Self-pay | Admitting: Interventional Cardiology

## 2021-02-06 ENCOUNTER — Telehealth: Payer: Self-pay | Admitting: Interventional Cardiology

## 2021-02-06 MED ORDER — FUROSEMIDE 20 MG PO TABS: 20.0000 mg | ORAL_TABLET | Freq: Every day | ORAL | 0 refills | Status: DC

## 2021-02-06 NOTE — Telephone Encounter (Signed)
*  STAT* If patient is at the pharmacy, call can be transferred to refill team.   1. Which medications need to be refilled? (please list name of each medication and dose if known) furosemide (LASIX) 20 MG tablet  2. Which pharmacy/location (including street and city if local pharmacy) is medication to be sent to? [id number] - [place], [place] - [address] AT [address]  3. Do they need a 30 day or 90 day supply? 90

## 2021-02-06 NOTE — Telephone Encounter (Signed)
Pt's medication was sent to pt's pharmacy as requested. Confirmation received.  °

## 2021-03-11 ENCOUNTER — Other Ambulatory Visit: Payer: Self-pay | Admitting: Obstetrics and Gynecology

## 2021-03-12 NOTE — Telephone Encounter (Signed)
I would recommend that she be seen. Please put her on Heather Myers's schedule today or tomorrow (in an open slot).

## 2021-03-12 NOTE — Telephone Encounter (Signed)
Pt confirmed is requesting refill for fluconazole due to current sx of possible yeast infection. Experiencing discharge/itching/irritation. Rx in for 2tabs 0RF. Tg

## 2021-03-12 NOTE — Telephone Encounter (Signed)
Message sent to appointments to call and schedule. °

## 2021-03-13 ENCOUNTER — Other Ambulatory Visit: Payer: Self-pay

## 2021-03-13 ENCOUNTER — Encounter: Payer: Self-pay | Admitting: Nurse Practitioner

## 2021-03-13 ENCOUNTER — Ambulatory Visit (INDEPENDENT_AMBULATORY_CARE_PROVIDER_SITE_OTHER): Payer: BC Managed Care – PPO | Admitting: Nurse Practitioner

## 2021-03-13 VITALS — BP 134/82

## 2021-03-13 DIAGNOSIS — Z23 Encounter for immunization: Secondary | ICD-10-CM

## 2021-03-13 DIAGNOSIS — N898 Other specified noninflammatory disorders of vagina: Secondary | ICD-10-CM | POA: Diagnosis not present

## 2021-03-13 DIAGNOSIS — R300 Dysuria: Secondary | ICD-10-CM

## 2021-03-13 DIAGNOSIS — B9689 Other specified bacterial agents as the cause of diseases classified elsewhere: Secondary | ICD-10-CM

## 2021-03-13 DIAGNOSIS — N760 Acute vaginitis: Secondary | ICD-10-CM | POA: Diagnosis not present

## 2021-03-13 LAB — WET PREP FOR TRICH, YEAST, CLUE

## 2021-03-13 LAB — URINALYSIS, COMPLETE W/RFL CULTURE
Bacteria, UA: NONE SEEN /HPF
Bilirubin Urine: NEGATIVE
Hgb urine dipstick: NEGATIVE
Hyaline Cast: NONE SEEN /LPF
Ketones, ur: NEGATIVE
Leukocyte Esterase: NEGATIVE
Nitrites, Initial: NEGATIVE
Protein, ur: NEGATIVE
RBC / HPF: NONE SEEN /HPF (ref 0–2)
Specific Gravity, Urine: 1.029 (ref 1.001–1.035)
WBC, UA: NONE SEEN /HPF (ref 0–5)
pH: 5.5 (ref 5.0–8.0)

## 2021-03-13 LAB — NO CULTURE INDICATED

## 2021-03-13 MED ORDER — METRONIDAZOLE 0.75 % VA GEL: 1.0000 | Freq: Every day | VAGINAL | 0 refills | Status: AC

## 2021-03-13 NOTE — Progress Notes (Signed)
   Acute Office Visit  Subjective:    Patient ID: Heather Myers, female    DOB: Dec 04, 1965, 55 y.o.   MRN: [id number]   HPI 55 y.o. presents today for vaginal itching, discharge, dysuria, frequency, and urgency that started 4 days ago. She used 3-day Monistat with no improvement. Requests flu shot today.   Review of Systems  Constitutional: Negative.   Genitourinary:  Positive for dysuria, frequency, urgency and vaginal discharge.       Vaginal odor      Objective:    Physical Exam Constitutional:      Appearance: Normal appearance.  Abdominal:     Tenderness: There is no right CVA tenderness or left CVA tenderness.  Genitourinary:    General: Normal vulva.     Vagina: Normal.     Cervix: Normal.    BP 134/82 (Cuff Size: Large)  Wt Readings from Last 3 Encounters:  11/22/20 261 lb (118.4 kg)  07/08/20 267 lb (121.1 kg)  11/23/19 (!) 267 lb (121.1 kg)   Wet prep + clue cells UA negative     Assessment & Plan:   Problem List Items Addressed This Visit   None Visit Diagnoses     Bacterial vaginosis    -  Primary   Relevant Medications   metroNIDAZOLE (METROGEL) 0.75 % vaginal gel   Vagina itching       Relevant Orders   WET PREP FOR TRICH, YEAST, CLUE (Completed)   Dysuria       Relevant Orders   Urinalysis,Complete w/RFL Culture (Completed)   Need for immunization against influenza       Relevant Orders   Flu Vaccine QUAD 44mo+IM (Fluarix, Fluzone & Alfiuria Quad PF) (Completed)      Plan: Wet prep positive for clue cells - Metrogel 0.75% nightly x 5 nights. UA unremarkable. Will return if symptoms worsen or do not improve. She is agreeable to plan.      Olivia Mackie DNP, 4:15 PM 03/13/2021

## 2021-03-14 ENCOUNTER — Encounter: Payer: Self-pay | Admitting: Internal Medicine

## 2021-04-02 DIAGNOSIS — E1165 Type 2 diabetes mellitus with hyperglycemia: Secondary | ICD-10-CM | POA: Diagnosis not present

## 2021-04-02 DIAGNOSIS — Z794 Long term (current) use of insulin: Secondary | ICD-10-CM | POA: Diagnosis not present

## 2021-04-02 DIAGNOSIS — Z7985 Long-term (current) use of injectable non-insulin antidiabetic drugs: Secondary | ICD-10-CM | POA: Diagnosis not present

## 2021-04-02 DIAGNOSIS — Z7984 Long term (current) use of oral hypoglycemic drugs: Secondary | ICD-10-CM | POA: Diagnosis not present

## 2021-04-02 DIAGNOSIS — Z6841 Body Mass Index (BMI) 40.0 and over, adult: Secondary | ICD-10-CM | POA: Diagnosis not present

## 2021-04-03 ENCOUNTER — Encounter: Payer: Self-pay | Admitting: Obstetrics and Gynecology

## 2021-04-04 ENCOUNTER — Other Ambulatory Visit: Payer: Self-pay | Admitting: Interventional Cardiology

## 2021-04-07 ENCOUNTER — Other Ambulatory Visit: Payer: Self-pay | Admitting: Nurse Practitioner

## 2021-04-07 DIAGNOSIS — B9689 Other specified bacterial agents as the cause of diseases classified elsewhere: Secondary | ICD-10-CM

## 2021-04-07 MED ORDER — METRONIDAZOLE 500 MG PO TABS: 500.0000 mg | ORAL_TABLET | Freq: Two times a day (BID) | ORAL | 0 refills | Status: DC

## 2021-04-12 ENCOUNTER — Other Ambulatory Visit: Payer: Self-pay | Admitting: Interventional Cardiology

## 2021-04-25 ENCOUNTER — Other Ambulatory Visit: Payer: Self-pay | Admitting: Interventional Cardiology

## 2021-04-30 NOTE — Progress Notes (Deleted)
Cardiology Office Note    Date:  04/30/2021   ID:  Heather Myers, DOB 09-06-1965, MRN [id number]   PCP:  Martinique, Betty G, [place]  Cardiologist:  Sinclair Grooms, MD   Advanced Practice Provider:  No care team member to display Electrophysiologist:  None   [id number]   No chief complaint on file.   History of Present Illness:  Heather Myers is a 56 y.o. female has a history of morbid obesity, fluid retention, mild OSA (by prior sleep study), DM, HTN, HLD (followed by PCP), and iron deficiency anemia  (related to prior heavy menstruation).  She has been followed by Dr. Tamala Julian for dyspnea. Nuclear stress test 07/2010 showed no ischemia, probably motion artifact, possibility of EF abnormality, but clarified with normal echo. Last 2D echo 05/2016 showed EF 55-60%, grade 1 DD. She has a history of fluid retention treated with Lasix. Back in 2018, there was a request from dermatology to add spironolactone in her regimen as a means to control hair loss and acne. At that visit, Lasix was decreased to 20mg  daily and spironolactone was added at 50mg  daily.     Past Medical History:  Diagnosis Date   Allergic rhinitis    Anemia    Anxiety    Arthritis    Asthma, mild intermittent [id number]   Complication of anesthesia    difficulty waking up from anesthesia   Depression    Diabetes mellitus type 2 in obese [facility])    Echocardiogram shows left ventricular diastolic dysfunction    Fluid retention    Hyperlipidemia    Hypertension    Morbid obesity ([place])    Pneumonia    history of   Sleep apnea    mild sleep, no CPAP needed at this time    Past Surgical History:  Procedure Laterality Date   ABDOMINAL SURGERY     chronic abd wall abscess   CESAREAN SECTION     x's 2   CHOLECYSTECTOMY     DILATATION & CURETTAGE/HYSTEROSCOPY WITH MYOSURE N/A 12/08/2016   Procedure: DILATATION & CURETTAGE/HYSTEROSCOPY WITH MYOSURE;  Surgeon:  Salvadore Dom, MD;  Location: [facility];  Service: Gynecology;  Laterality: N/A;   INTRAUTERINE DEVICE (IUD) INSERTION N/A 12/08/2016   Procedure: MIRENA INTRAUTERINE DEVICE (IUD) INSERTION (IUD FROM OFFICE);  Surgeon: Salvadore Dom, MD;  Location: [facility];  Service: Gynecology;  Laterality: N/A;   KNEE ARTHROSCOPY Right    LIPOMA EXCISION  08/03/2011   Procedure: EXCISION LIPOMA;  Surgeon: Harl Bowie, MD;  Location: [facility];  Service: General;  Laterality: Right;  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     Allergies:   Ramipril   Social History   Socioeconomic History   Marital status: Legally Separated    Spouse name: Not on file   Number of children: Not on file   Years of education: Not on file   Highest education level: Not on file  Occupational History   Not on file  Tobacco Use   Smoking status: Never   Smokeless tobacco: Never  Substance and Sexual Activity   Alcohol use: Not Currently   Drug use: No   Sexual activity: Not  Currently    Partners: Male    Birth control/protection: I.U.D.  Other Topics Concern   Not on file  Social History Narrative   Not on file   Social Determinants of Health   Financial Resource Strain: Not on file  Food Insecurity: Not on file  Transportation Needs: Not on file  Physical Activity: Not on file  Stress: Not on file  Social Connections: Not on file     Family History:  The patient's ***family history includes Cancer in her father; Diabetes in her mother and another family member; Hypertension in her father and mother; Other in her mother.   ROS:   Please see the history of present illness.    ROS All other systems reviewed and are negative.   PHYSICAL EXAM:   VS:  There  were no vitals taken for this visit.  Physical Exam  GEN: Well nourished, well developed, in no acute distress  HEENT: normal  Neck: no JVD, carotid bruits, or masses Cardiac:RRR; no murmurs, rubs, or gallops  Respiratory:  clear to auscultation bilaterally, normal work of breathing GI: soft, nontender, nondistended, + BS Ext: without cyanosis, clubbing, or edema, Good distal pulses bilaterally MS: no deformity or atrophy  Skin: warm and dry, no rash Neuro:  Alert and Oriented x 3, Strength and sensation are intact Psych: euthymic mood, full affect  Wt Readings from Last 3 Encounters:  11/22/20 261 lb (118.4 kg)  07/08/20 267 lb (121.1 kg)  11/23/19 (!) 267 lb (121.1 kg)      Studies/Labs Reviewed:   EKG:  EKG is*** ordered today.  The ekg ordered today demonstrates ***  Recent Labs: No results found for requested labs within last 8760 hours.   Lipid Panel    Component Value Date/Time   CHOL 156 03/03/2018 1312   TRIG 188 (H) 03/03/2018 1312   HDL 43 03/03/2018 1312   CHOLHDL 3.6 03/03/2018 1312   CHOLHDL 6 06/15/2017 1444   VLDL 25.8 06/15/2017 1444   LDLCALC 75 03/03/2018 1312    Additional studies/ records that were reviewed today include:  Echo Study Conclusions 05/2016  - Left ventricle: The cavity size was normal. Systolic function was    normal. The estimated ejection fraction was in the range of 55%    to 60%. Wall motion was normal; there were no regional wall    motion abnormalities. Doppler parameters are consistent with    abnormal left ventricular relaxation (grade 1 diastolic    dysfunction). Acoustic contrast opacification revealed no    evidence of thrombus.        Risk Assessment/Calculations:   {Does this patient have ATRIAL FIBRILLATION?:[phone]}     ASSESSMENT:    No diagnosis found.   PLAN:  In order of problems listed above: 1. Chronic DOE - last echo stable from 2018 - does have diastolic dysfunction - symptoms are stable.  Has good BP control. Weight is down.    2. HTN - BP is good - medicines refilled today and will continue with the Aldactone, Lasix and Lopressor.    3. HLD - on statin - lab per PCP   4. Morbid obesity - actively losing weight - encouraged her to continue with her efforts.    5. Palpitations - not really an issue - she will continue with her Toprol.      Shared Decision Making/Informed Consent   {Are you ordering a CV Procedure (e.g. stress test, cath, DCCV, TEE, etc)?   Press F2        :  [id number]    Medication Adjustments/Labs and Tests Ordered: Current medicines are reviewed at length with the patient today.  Concerns regarding medicines are outlined above.  Medication changes, Labs and Tests ordered today are listed in the Patient Instructions below. There are no Patient Instructions on file for this visit.   Signed, Ermalinda Barrios, PA-C  04/30/2021 3:11 PM    [facility] [place], [place], [place]  [address] Phone: [phone]; Fax: [phone]

## 2021-05-03 ENCOUNTER — Other Ambulatory Visit: Payer: Self-pay | Admitting: Interventional Cardiology

## 2021-05-05 ENCOUNTER — Ambulatory Visit (AMBULATORY_SURGERY_CENTER): Payer: BC Managed Care – PPO

## 2021-05-05 ENCOUNTER — Other Ambulatory Visit: Payer: Self-pay

## 2021-05-05 VITALS — Ht 64.0 in | Wt 265.0 lb

## 2021-05-05 DIAGNOSIS — Z1211 Encounter for screening for malignant neoplasm of colon: Secondary | ICD-10-CM

## 2021-05-05 MED ORDER — NA SULFATE-K SULFATE-MG SULF 17.5-3.13-1.6 GM/177ML PO SOLN
1.0000 | Freq: Once | ORAL | 0 refills | Status: AC
Start: 2021-05-05 — End: 2021-05-05

## 2021-05-05 NOTE — Progress Notes (Signed)
Pre visit completed via phone call; Patient verified name, DOB, and address; No egg or soy allergy known to patient  No issues known to pt with past sedation with any surgeries or procedures---- patient reports she is "hard to wake up" Patient denies ever being told they had issues or difficulty with intubation  No FH of Malignant Hyperthermia Pt is not on diet pills Pt is not on home 02  Pt is not on blood thinners  Pt denies issues with constipation at this time; No A fib or A flutter Pt is fully vaccinated for Covid x 2; NO PA's for preps discussed with pt in PV today  Discussed with pt there will be an out-of-pocket cost for prep and that varies from $0 to 70 + dollars - pt verbalized understanding  Due to the COVID-19 pandemic we are asking patients to follow certain guidelines in PV and the [facility]   Pt aware of COVID protocols and [facility] guidelines

## 2021-05-07 ENCOUNTER — Ambulatory Visit: Payer: BC Managed Care – PPO | Admitting: Physician Assistant

## 2021-05-09 ENCOUNTER — Other Ambulatory Visit: Payer: Self-pay | Admitting: Interventional Cardiology

## 2021-05-13 ENCOUNTER — Encounter: Payer: Self-pay | Admitting: Internal Medicine

## 2021-05-19 ENCOUNTER — Telehealth: Payer: Self-pay | Admitting: Internal Medicine

## 2021-05-19 ENCOUNTER — Encounter: Payer: BC Managed Care – PPO | Admitting: Internal Medicine

## 2021-06-03 ENCOUNTER — Other Ambulatory Visit: Payer: Self-pay | Admitting: Interventional Cardiology

## 2021-06-05 ENCOUNTER — Ambulatory Visit: Payer: Self-pay

## 2021-06-05 ENCOUNTER — Ambulatory Visit (INDEPENDENT_AMBULATORY_CARE_PROVIDER_SITE_OTHER): Payer: BC Managed Care – PPO | Admitting: Physician Assistant

## 2021-06-05 ENCOUNTER — Encounter: Payer: Self-pay | Admitting: Physician Assistant

## 2021-06-05 ENCOUNTER — Other Ambulatory Visit: Payer: Self-pay

## 2021-06-05 VITALS — Ht 66.25 in | Wt 268.6 lb

## 2021-06-05 DIAGNOSIS — M1711 Unilateral primary osteoarthritis, right knee: Secondary | ICD-10-CM

## 2021-06-05 DIAGNOSIS — M1712 Unilateral primary osteoarthritis, left knee: Secondary | ICD-10-CM

## 2021-06-05 DIAGNOSIS — M170 Bilateral primary osteoarthritis of knee: Secondary | ICD-10-CM | POA: Diagnosis not present

## 2021-06-05 MED ORDER — METHYLPREDNISOLONE ACETATE 40 MG/ML IJ SUSP
40.0000 mg | INTRAMUSCULAR | Status: AC | PRN
  Administered 2021-06-05: 40 mg via INTRA_ARTICULAR

## 2021-06-05 MED ORDER — TRAMADOL HCL 50 MG PO TABS: 50.0000 mg | ORAL_TABLET | Freq: Four times a day (QID) | ORAL | 0 refills | Status: DC | PRN

## 2021-06-05 MED ORDER — LIDOCAINE HCL 1 % IJ SOLN
3.0000 mL | INTRAMUSCULAR | Status: AC | PRN
  Administered 2021-06-05: 3 mL

## 2021-06-05 NOTE — Progress Notes (Signed)
° °  Procedure Note  Patient: Heather Myers             Date of Birth: [birth year]           MRN: [id number]             Visit Date: 06/05/2021 HPI: Heather Myers is a 56 year old female who comes in today for complaint of bilateral knee pain.  She was last seen by Dr. Ninfa Linden in August 2019 for her knees.  At that time she was given a steroid injection in her right knee and supplemental injection in her left knee.  She states she has had no pain in either knee until about a week and a half ago.  She has had no new injury to either knee.  She does have known arthritis of both knees mainly patellofemoral.  She is diabetic reports last hemoglobin A1c was 8.42 months ago.  Physical exam: General well-developed well-nourished female who ambulates without any assistive device. Bilateral knees: No abnormal warmth erythema or effusion.  Patellofemoral crepitus left knee.  Global tenderness left knee right knee no tenderness with palpation.  No instability valgus varus stressing of either knee.  Good range of motion of both knees.  Radiographs bilateral knees AP lateral views: Right knee periarticular spurring lateral and medial joint line.  Moderate patellofemoral arthritic changes.  No acute fracture.  Right knee is well located.  Left knee 2 views show no acute fractures.  Knee is well located.  Periarticular spurring is medial lateral joint line but medial lateral compartments are overall well-preserved otherwise.  Severe patellofemoral arthritic changes.  Procedures: Visit Diagnoses:  1. Primary osteoarthritis of left knee   2. Primary osteoarthritis of right knee     Large Joint Inj: L knee on 06/05/2021 5:31 PM Indications: pain Details: 22 G 1.5 in needle, anterolateral approach  Arthrogram: No  Medications: 3 mL lidocaine 1 %; 40 mg methylPREDNISolone acetate 40 MG/ML Outcome: tolerated well, no immediate complications Procedure, treatment alternatives, risks and benefits  explained, specific risks discussed. Consent was given by the patient. Immediately prior to procedure a time out was called to verify the correct patient, procedure, equipment, support staff and site/side marked as required. Patient was prepped and draped in the usual sterile fashion.    Plan: Given that her poorly controlled diabetes offered her injection for the knee due to the fact the left knee is bothering her the most she decided to go with this knee.  Try to gain approval for supplemental injections both knees and have her back once available.  Questions were encouraged and answered at length.  Patient did ask for pain medicine mainly take at night she is given tramadol to take sparingly.  She will continue using Voltaren gel on her knees.  Questions were encouraged and answered

## 2021-06-06 ENCOUNTER — Telehealth: Payer: Self-pay

## 2021-06-06 NOTE — Telephone Encounter (Signed)
Please get auth for bilateral knee gel injection 

## 2021-06-09 NOTE — Telephone Encounter (Signed)
Noted  

## 2021-06-26 ENCOUNTER — Telehealth: Payer: Self-pay | Admitting: Interventional Cardiology

## 2021-06-26 ENCOUNTER — Other Ambulatory Visit: Payer: Self-pay | Admitting: Interventional Cardiology

## 2021-06-26 MED ORDER — METOPROLOL SUCCINATE ER 25 MG PO TB24: ORAL_TABLET | ORAL | 1 refills | Status: DC

## 2021-06-26 MED ORDER — FUROSEMIDE 20 MG PO TABS: 20.0000 mg | ORAL_TABLET | Freq: Every day | ORAL | 1 refills | Status: DC

## 2021-06-26 MED ORDER — METOPROLOL SUCCINATE ER 50 MG PO TB24: ORAL_TABLET | ORAL | 1 refills | Status: DC

## 2021-06-26 NOTE — Telephone Encounter (Signed)
?*  STAT* If patient is at the pharmacy, call can be transferred to refill team. ? ? ?1. Which medications need to be refilled? (please list name of each medication and dose if known)  ?metoprolol succinate (TOPROL-XL) 50 MG 24 hr tablet ?metoprolol succinate (TOPROL-XL) 25 MG 24 hr tablet ?furosemide (LASIX) 20 MG tablet ? ?2. Which pharmacy/location (including street and city if local pharmacy) is medication to be sent to? ?[id number] - [place] [place] - [address] [facility] [address] ? ?3. Do they need a 30 day or 90 day supply? 90 ? ?Patient is scheduled to see Chelsea Aus 08/18/21 at 8:00  ?

## 2021-06-26 NOTE — Telephone Encounter (Signed)
Pt's medications were sent to pt's pharmacy as requested. Confirmation received.  

## 2021-07-02 ENCOUNTER — Other Ambulatory Visit: Payer: Self-pay | Admitting: Obstetrics and Gynecology

## 2021-07-02 ENCOUNTER — Other Ambulatory Visit: Payer: Self-pay | Admitting: Interventional Cardiology

## 2021-07-02 ENCOUNTER — Other Ambulatory Visit: Payer: Self-pay | Admitting: Family Medicine

## 2021-07-02 DIAGNOSIS — E1169 Type 2 diabetes mellitus with other specified complication: Secondary | ICD-10-CM

## 2021-07-02 DIAGNOSIS — B009 Herpesviral infection, unspecified: Secondary | ICD-10-CM

## 2021-07-02 DIAGNOSIS — F418 Other specified anxiety disorders: Secondary | ICD-10-CM

## 2021-07-02 MED ORDER — METOPROLOL SUCCINATE ER 25 MG PO TB24: ORAL_TABLET | ORAL | 3 refills | Status: DC

## 2021-07-02 NOTE — Telephone Encounter (Signed)
Last AEX 11/22/20--Rx sent for 90day supply with 4 RF.  ? ?Next AEX due end of 10/2021--early 11/2021. ? ?Mychart msg sent to pt: ? ?"Good afternoon Heather Myers. Iam writing in regards to a prescription [facility]ill [facility] we [facility]. Dr. Oscar La had sent in on 11/22/20 the prescription for 90 days supply with 4 refills = 450. So we are wondering if you still have plenty for now or is this something you need asap? Let us know, thanks." ?

## 2021-07-07 ENCOUNTER — Other Ambulatory Visit: Payer: Self-pay | Admitting: Interventional Cardiology

## 2021-07-07 ENCOUNTER — Telehealth: Payer: Self-pay | Admitting: Orthopaedic Surgery

## 2021-07-07 NOTE — Telephone Encounter (Signed)
Please advise 

## 2021-07-07 NOTE — Telephone Encounter (Signed)
Pt calling to ask about an update about gel injs ordered over a month ago. Pt was told she was would be called back within 2 week from previous appt and has not heard anything back. Pt said she is in severe pain and really needs injs and was given cort inj in one knee but not the other at previous appt. The best call back number is [phone].  ?

## 2021-07-08 NOTE — Telephone Encounter (Signed)
Talked with patient and appointment has been scheduled.  

## 2021-07-09 ENCOUNTER — Ambulatory Visit: Payer: BC Managed Care – PPO | Admitting: Physician Assistant

## 2021-07-09 ENCOUNTER — Encounter: Payer: Self-pay | Admitting: Physician Assistant

## 2021-07-09 DIAGNOSIS — M1712 Unilateral primary osteoarthritis, left knee: Secondary | ICD-10-CM

## 2021-07-09 DIAGNOSIS — M1711 Unilateral primary osteoarthritis, right knee: Secondary | ICD-10-CM

## 2021-07-09 DIAGNOSIS — M170 Bilateral primary osteoarthritis of knee: Secondary | ICD-10-CM | POA: Diagnosis not present

## 2021-07-09 MED ORDER — LIDOCAINE HCL 1 % IJ SOLN
3.0000 mL | INTRAMUSCULAR | Status: AC | PRN
  Administered 2021-07-09: 3 mL

## 2021-07-09 MED ORDER — HYLAN G-F 20 48 MG/6ML IX SOSY
48.0000 mg | PREFILLED_SYRINGE | INTRA_ARTICULAR | Status: AC | PRN
  Administered 2021-07-09: 48 mg via INTRA_ARTICULAR

## 2021-07-09 NOTE — Progress Notes (Signed)
? ?  Procedure Note ? ?Patient: Heather Myers             ?Date of Birth: [birth year]           ?MRN: [id number]             ?Visit Date: 07/09/2021 ?HPI: Mrs. Harwood returns today for scheduled Synvisc 1 injections both knees.  She has had prior supplemental injections and is done well with this.  She has had no known injury to either knee.  Today her right knee is bothering her more so than the left.  She has no upcoming surgery for either knee.  She has known osteoarthritis both knees mainly involving the patellofemoral joint. ? ?Physical exam: Bilateral knees no abnormal warmth erythema or effusion.  Right knee she has tenderness medial lateral joint line.  Good range of motion of both knees. ? ?Procedures: ?Visit Diagnoses:  ?1. Primary osteoarthritis of left knee   ?2. Primary osteoarthritis of right knee   ? ? ?Large Joint Inj: bilateral knee on 07/09/2021 11:29 AM ?Indications: pain ?Details: 22 G 1.5 in needle, superolateral approach ? ?Arthrogram: No ? ?Medications (Right): 3 mL lidocaine 1 %; 48 mg Hylan 48 MG/6ML ?Medications (Left): 3 mL lidocaine 1 %; 48 mg Hylan 48 MG/6ML ?Outcome: tolerated well, no immediate complications ?Procedure, treatment alternatives, risks and benefits explained, specific risks discussed. Consent was given by the patient. Immediately prior to procedure a time out was called to verify the correct patient, procedure, equipment, support staff and site/side marked as required. Patient was prepped and draped in the usual sterile fashion.  ? ? ?Plan: She will follow-up with [place] as needed.  She knows to wait at least 6 months between supplemental injections.  Questions were encouraged and answered at length today. ? ? ?

## 2021-07-15 ENCOUNTER — Telehealth: Payer: Self-pay

## 2021-07-15 NOTE — Telephone Encounter (Signed)
Approved for SynviscOne, bilateral knee. ?Buy & Bill ?Covered at 100% after Co-pay ?Co-pay of $40.00- $80.00 ?PA Approval# [id number] ?Valid 07/11/2021- 01/06/2022 ?

## 2021-07-18 ENCOUNTER — Encounter: Payer: Self-pay | Admitting: Family

## 2021-07-19 ENCOUNTER — Other Ambulatory Visit: Payer: Self-pay | Admitting: Interventional Cardiology

## 2021-07-25 ENCOUNTER — Telehealth: Payer: Self-pay | Admitting: Internal Medicine

## 2021-07-25 ENCOUNTER — Encounter: Payer: BC Managed Care – PPO | Admitting: Internal Medicine

## 2021-07-25 DIAGNOSIS — H2000 Unspecified acute and subacute iridocyclitis: Secondary | ICD-10-CM | POA: Diagnosis not present

## 2021-07-25 DIAGNOSIS — H10412 Chronic giant papillary conjunctivitis, left eye: Secondary | ICD-10-CM | POA: Diagnosis not present

## 2021-07-25 NOTE — Telephone Encounter (Signed)
No show for Colonoscopy today. Attempted to call to inquire if she is still planning to come in. ?

## 2021-07-25 NOTE — Telephone Encounter (Signed)
Spoke to pt, she tells me she honestly does not recall scheduling this appointment. She No Showed on 05-19-21 and rescheduled for today. She says she never received prep instructions in the mail and there was no prep medications at her pharmacy. She apologizes for missing appointment. Will hold off on rescheduling at this time, will call back when she has her calendar. ?

## 2021-07-25 NOTE — Telephone Encounter (Signed)
Thanks for the follow-up. ?If she does decide to reschedule, it would be best to give her an in person previsit appointment. ?

## 2021-07-30 DIAGNOSIS — Z7984 Long term (current) use of oral hypoglycemic drugs: Secondary | ICD-10-CM | POA: Diagnosis not present

## 2021-07-30 DIAGNOSIS — E1165 Type 2 diabetes mellitus with hyperglycemia: Secondary | ICD-10-CM | POA: Diagnosis not present

## 2021-07-30 DIAGNOSIS — Z794 Long term (current) use of insulin: Secondary | ICD-10-CM | POA: Diagnosis not present

## 2021-08-15 ENCOUNTER — Other Ambulatory Visit: Payer: Self-pay | Admitting: Family Medicine

## 2021-08-15 ENCOUNTER — Other Ambulatory Visit: Payer: Self-pay | Admitting: Interventional Cardiology

## 2021-08-15 DIAGNOSIS — E1169 Type 2 diabetes mellitus with other specified complication: Secondary | ICD-10-CM

## 2021-08-18 ENCOUNTER — Ambulatory Visit: Payer: BC Managed Care – PPO | Admitting: Physician Assistant

## 2021-08-18 NOTE — Progress Notes (Deleted)
Cardiology Office Note:    Date:  08/18/2021   ID:  Heather Myers, DOB [birth year], MRN [id number]  PCP:  Martin[place], Betty G, MD  [facility] Cardiologist:  Sinclair Grooms, MD  [facility] Electrophysiologist:  None   Chief Complaint: 18 months follow up  History of Present Illness:    Heather Myers is a 56 y.o. female with a hx of  morbid obesity, fluid retention, mild OSA (by prior sleep study), DM, HTN, HLD (followed by PCP), and iron deficiency anemia  (related to prior heavy menstruation) presents for follow up.   Nuclear stress test 07/2010 showed no ischemia, probably motion artifact, possibility of EF abnormality, but clarified with normal echo. Last 2D echo 05/2016 showed EF 55-60%, grade 1 DD. She has a history of fluid retention treated with Lasix. Back in 2018, there was a request from dermatology to add spironolactone in her regimen as a means to control hair loss and acne. At that visit, Lasix was decreased to 20mg  daily and spironolactone was added at 50mg  daily. She has a h/o palpitations controlled with metoprolol.  Last seen 10/2019.  Past Medical History:  Diagnosis Date   Allergic rhinitis    Anemia    Anxiety    Arthritis    Asthma, mild intermittent 12/19/2015   uses inhaler as needed   Complication of anesthesia    difficulty waking up from anesthesia   Depression    Diabetes mellitus type 2 in obese [facility])    Echocardiogram shows left ventricular diastolic dysfunction    Fluid retention    Hyperlipidemia    on meds   Hypertension    on meds   Morbid obesity ([place])    Pneumonia    history of   Seasonal allergies    Sleep apnea    mild sleep, no CPAP needed at this time    Past Surgical History:  Procedure Laterality Date   ABDOMINAL SURGERY     chronic abd wall abscess   CESAREAN SECTION     x's 2   CHOLECYSTECTOMY     DILATATION & CURETTAGE/HYSTEROSCOPY WITH MYOSURE N/A 12/08/2016   Procedure: DILATATION &  CURETTAGE/HYSTEROSCOPY WITH MYOSURE;  Surgeon: Salvadore Dom, MD;  Location: [facility];  Service: Gynecology;  Laterality: N/A;   INTRAUTERINE DEVICE (IUD) INSERTION N/A 12/08/2016   Procedure: MIRENA INTRAUTERINE DEVICE (IUD) INSERTION (IUD FROM OFFICE);  Surgeon: Salvadore Dom, MD;  Location: [facility];  Service: Gynecology;  Laterality: N/A;   KNEE ARTHROSCOPY Right    LIPOMA EXCISION  08/03/2011   Procedure: EXCISION LIPOMA;  Surgeon: Harl Bowie, MD;  Location: [facility];  Service: General;  Laterality: Right;  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     Allergies:   Ramipril   Social History   Socioeconomic History   Marital status: Legally Separated    Spouse name: Not on file   Number of children: Not on file   Years of education: Not on file   Highest education level: Not on file  Occupational History   Not on file  Tobacco Use   Smoking status: Never   Smokeless tobacco: Never  Vaping Use   Vaping Use: Never used  Substance and Sexual Activity  Alcohol use: Not Currently   Drug use: No   Sexual activity: Not Currently    Partners: Male    Birth control/protection: I.U.D.  Other Topics Concern   Not on file  Social History Narrative   Not on file   Social Determinants of Health   Financial Resource Strain: Not on file  Food Insecurity: Not on file  Transportation Needs: Not on file  Physical Activity: Not on file  Stress: Not on file  Social Connections: Not on file     Family History: The patient's family history includes Cancer in her father; Diabetes in her mother and another family member; Hypertension in her father and mother; Other in her mother. There is no history of Colon polyps, Colon cancer,  Esophageal cancer, Rectal cancer, or Stomach cancer.  ***  ROS:   Please see the history of present illness.    All other systems reviewed and are negative. ***  EKGs/Labs/Other Studies Reviewed:    The following studies were reviewed today:  Echo Study Conclusions 05/2016  - Left ventricle: The cavity size was normal. Systolic function was    normal. The estimated ejection fraction was in the range of 55%    to 60%. Wall motion was normal; there were no regional wall    motion abnormalities. Doppler parameters are consistent with    abnormal left ventricular relaxation (grade 1 diastolic    dysfunction). Acoustic contrast opacification revealed no    evidence of thrombus.   EKG:  EKG is *** ordered today.  The ekg ordered today demonstrates ***  Recent Labs: No results found for requested labs within last 8760 hours.  Recent Lipid Panel    Component Value Date/Time   CHOL 156 03/03/2018 1312   TRIG 188 (H) 03/03/2018 1312   HDL 43 03/03/2018 1312   CHOLHDL 3.6 03/03/2018 1312   CHOLHDL 6 06/15/2017 1444   VLDL 25.8 06/15/2017 1444   LDLCALC 75 03/03/2018 1312     Risk Assessment/Calculations:   {Does this patient have ATRIAL FIBRILLATION?:[phone]}   Physical Exam:    VS:  There were no vitals taken for this visit.    Wt Readings from Last 3 Encounters:  06/05/21 268 lb 9.6 oz (121.8 kg)  05/05/21 265 lb (120.2 kg)  11/22/20 261 lb (118.4 kg)     GEN: *** Well nourished, well developed in no acute distress HEENT: Normal NECK: No JVD; No carotid bruits LYMPHATICS: No lymphadenopathy CARDIAC: ***RRR, no murmurs, rubs, gallops RESPIRATORY:  Clear to auscultation without rales, wheezing or rhonchi  ABDOMEN: Soft, non-tender, non-distended MUSCULOSKELETAL:  No edema; No deformity  SKIN: Warm and dry NEUROLOGIC:  Alert and oriented x 3 PSYCHIATRIC:  Normal affect   ASSESSMENT AND PLAN:    HTN  2. HLD - followed by PCP  3. Morbid obesity    Medication Adjustments/Labs and Tests Ordered: Current medicines are reviewed at length with the patient today.  Concerns regarding medicines are outlined above.  No orders of the defined types were placed in this encounter.  No orders of the defined types were placed in this encounter.   There are no Patient Instructions on file for this visit.   Jarrett Soho, [place]  08/18/2021 7:59 AM    [facility]

## 2021-08-19 ENCOUNTER — Other Ambulatory Visit: Payer: Self-pay | Admitting: Family Medicine

## 2021-08-19 DIAGNOSIS — F418 Other specified anxiety disorders: Secondary | ICD-10-CM

## 2021-09-08 ENCOUNTER — Other Ambulatory Visit: Payer: Self-pay | Admitting: Family Medicine

## 2021-09-08 ENCOUNTER — Other Ambulatory Visit: Payer: Self-pay | Admitting: Interventional Cardiology

## 2021-09-08 DIAGNOSIS — F418 Other specified anxiety disorders: Secondary | ICD-10-CM

## 2021-09-15 DIAGNOSIS — E1165 Type 2 diabetes mellitus with hyperglycemia: Secondary | ICD-10-CM | POA: Diagnosis not present

## 2021-10-03 ENCOUNTER — Other Ambulatory Visit: Payer: Self-pay | Admitting: Interventional Cardiology

## 2021-10-20 ENCOUNTER — Telehealth: Payer: Self-pay | Admitting: Interventional Cardiology

## 2021-10-20 MED ORDER — FUROSEMIDE 20 MG PO TABS: 20.0000 mg | ORAL_TABLET | Freq: Every day | ORAL | 0 refills | Status: DC

## 2021-10-20 NOTE — Telephone Encounter (Signed)
*  STAT* If patient is at the pharmacy, call can be transferred to refill team.   1. Which medications need to be refilled? (please list name of each medication and dose if known)  furosemide (LASIX) 20 MG tablet  2. Which pharmacy/location (including street and city if local pharmacy) is medication to be sent to? [id number] [place], [place] - [address] [facility] [address]  3. Do they need a 30 day or 90 day supply? 90 day  Patient is completely out of medication. She has an appointment 6/29

## 2021-10-21 NOTE — Progress Notes (Signed)
Chief Complaint  Patient presents with   Follow-up   Nausea    & constipation x 3 days, along with chills & hot flashes. No fever.    HPI: [name] is a 56 y.o. female with history of DM 2, hyperlipidemia, hypertension, iron deficiency anemia, anxiety disorder, depression, and asthma here today for follow-up. She also has a few other concerns today.  Last seen on 06/07/19. Since her last visit she has been following with endocrinologist and cardiologist. She is seeing her cardiologist tomorrow.  Anxiety: Currently she is on citalopram 40 mg daily, which she has been taking for over 10 years. She has tried to wean off medication a few times but symptoms reoccurred. She ran out of citalopram 2 weeks ago. Medication helps with symptoms, she has tolerated well with no side effects.  Hyperlipidemia: Currently she is on atorvastatin 40 mg daily.  Lab Results  Component Value Date   CHOL 156 03/03/2018   HDL 43 03/03/2018   LDLCALC 75 03/03/2018   TRIG 188 (H) 03/03/2018   CHOLHDL 3.6 03/03/2018   -She is complaining of "severe" constipation, had a small bowel movement 2 days ago after taking OTC laxative and doing a fleet enema. States that she has not pass gas in a couple days, since her last bowel movement. She has had constipation in the past, not as severe, took Linzess a few years ago.  Severe nausea for the past 3 days, 4 episode of vomiting today. She is vomiting water and ginger ale she has drunken. Symptoms exacerbated by oral intake.  Negative for hematemesis. + Burping. Negative for heartburn.  LLQ abdominal pain,cramps, intermittent, 4/10. Pain is not radiated. She has not had fever but has had chills for 4-5 days. No urinary symptoms.  Also she "stuck something" in her left foot this past Monday when she was walking,she felt sudden pain. She noted like a splinter. Tender upon palpation and when walking. She has not noted ulcers.  Review of  Systems  Constitutional:  Positive for activity change, appetite change and fatigue.  HENT:  Negative for congestion, mouth sores, rhinorrhea, sore throat and trouble swallowing.   Gastrointestinal:  Negative for abdominal distention and rectal pain.  Endocrine: Negative for polydipsia, polyphagia and polyuria.  Genitourinary:  Negative for decreased urine volume, dysuria and hematuria.  Skin:  Negative for rash.  Neurological:  Negative for syncope, weakness and headaches.  Psychiatric/Behavioral:  Negative for confusion. The patient is nervous/anxious.   Rest of ROS see pertinent positives and negatives in HPI.  Current Outpatient Medications on File Prior to Visit  Medication Sig Dispense Refill   albuterol (VENTOLIN HFA) 108 (90 Base) MCG/ACT inhaler TAKE 2 PUFFS BY MOUTH EVERY 6 HOURS AS NEEDED FOR WHEEZE OR SHORTNESS OF BREATH 8.5 g 1   BAYER MICROLET LANCETS lancets Use as instructed to check sugar up to 6 times daily 600 each 5   Blood Glucose Monitoring Suppl (BAYER CONTOUR MONITOR) w/Device KIT Use to check sugar daily 1 each 0   Estradiol 10 MCG TABS vaginal tablet Place one tablet vaginally qhs x 1 week, then change to 2 x a week at hs. 24 tablet 4   furosemide (LASIX) 20 MG tablet Take 1 tablet (20 mg total) by mouth daily. Pt needs to keep upcoming appt in June 2023 with Cardiologist before anymore refills. Final Attempt 30 tablet 0   glucose blood (BAYER CONTOUR TEST) test strip Use as instructed to check sugar 6 times daily 600  each 5   insulin aspart (NOVOLOG FLEXPEN) 100 UNIT/ML FlexPen INJECT 25-30 UNITS 3 TIMES A DAY *15 MINUTES BEFORE MEALS* 24 mL 1   Insulin Pen Needle 31G X 5 MM MISC Use 4x a day 400 each 3   INVOKANA 300 MG TABS tablet TAKE 1 TABLET BY MOUTH EVERY DAY 90 tablet 3   metFORMIN (GLUCOPHAGE-XR) 500 MG 24 hr tablet TAKE 2 TABLETS BY MOUTH TWICE A DAY 360 tablet 0   metoprolol succinate (TOPROL-XL) 25 MG 24 hr tablet TAKE 1 TABLET BY MOUTH EVERY DAY WITH 50MG   TABLET FOR TOTAL OF 75MG  90 tablet 3   metoprolol succinate (TOPROL-XL) 50 MG 24 hr tablet TAKE 1 TABLET BY MOUTH EVERY DAY WITH 25MG  TABLET (75MG  TOTAL) WITH OR IMMEDIATELY FOLLOWING A MEAL 90 tablet 3   Multiple Vitamins-Minerals (MULTIVITAMIN WITH MINERALS) tablet Take 1 tablet by mouth daily.     Semaglutide, 1 MG/DOSE, (OZEMPIC, 1 MG/DOSE,) 4 MG/3ML SOPN Inject into the skin.     spironolactone (ALDACTONE) 25 MG tablet Take 1 tablet (25 mg total) by mouth 2 (two) times daily. Please keep upcoming appt for future refills. 180 tablet 0   TOUJEO SOLOSTAR 300 UNIT/ML SOPN INJECT 75 UNITS INTO THE SKIN DAILY 22.5 mL 2   traMADol (ULTRAM) 50 MG tablet Take 1 tablet (50 mg total) by mouth every 6 (six) hours as needed. 30 tablet 0   valACYclovir (VALTREX) 500 MG tablet Take one tablet daily, increase to one tablet po BID x 3 days prn outbreak 90 tablet 4   No current facility-administered medications on file prior to visit.   Past Medical History:  Diagnosis Date   Allergic rhinitis    Anemia    Anxiety    Arthritis    Asthma, mild intermittent 12/19/2015   uses inhaler as needed   Complication of anesthesia    difficulty waking up from anesthesia   Depression    Diabetes mellitus type 2 in obese [facility])    Echocardiogram shows left ventricular diastolic dysfunction    Fluid retention    Hyperlipidemia    on meds   Hypertension    on meds   Morbid obesity (HCC)    Pneumonia    history of   Seasonal allergies    Sleep apnea    mild sleep, no CPAP needed at this time   Allergies  Allergen Reactions   Ramipril Shortness Of Breath    Social History   Socioeconomic History   Marital status: Legally Separated    Spouse name: Not on file   Number of children: Not on file   Years of education: Not on file   Highest education level: Not on file  Occupational History   Not on file  Tobacco Use   Smoking status: Never   Smokeless tobacco: Never  Vaping Use   Vaping Use: Never  used  Substance and Sexual Activity   Alcohol use: Not Currently   Drug use: No   Sexual activity: Not Currently    Partners: Male    Birth control/protection: I.U.D.  Other Topics Concern   Not on file  Social History Narrative   Not on file   Social Determinants of Health   Financial Resource Strain: Not on file  Food Insecurity: Not on file  Transportation Needs: Not on file  Physical Activity: Not on file  Stress: Not on file  Social Connections: Not on file   Vitals:   10/22/21 1348  BP: 130/80  Pulse: 76  Resp: 12  Temp: 98.5 F (36.9 C)  SpO2: 99%   Body mass index is 42.61 kg/m.  Physical Exam Vitals and nursing note reviewed.  Constitutional:      General: She is not in acute distress.    Appearance: She is well-developed. She is not ill-appearing or toxic-appearing.  HENT:     Head: Normocephalic and atraumatic.     Mouth/Throat:     Mouth: Mucous membranes are moist.     Pharynx: Oropharynx is clear.  Eyes:     Conjunctiva/sclera: Conjunctivae normal.  Cardiovascular:     Rate and Rhythm: Normal rate and regular rhythm.     Pulses:          Dorsalis pedis pulses are 2+ on the right side and 2+ on the left side.     Heart sounds: No murmur heard. Pulmonary:     Effort: Pulmonary effort is normal. No respiratory distress.     Breath sounds: Normal breath sounds.  Abdominal:     General: Bowel sounds are decreased.     Palpations: Abdomen is soft. There is no hepatomegaly or mass.     Tenderness: There is no abdominal tenderness.  Musculoskeletal:       Feet:  Lymphadenopathy:     Cervical: No cervical adenopathy.  Skin:    General: Skin is warm.     Findings: No erythema or rash.  Neurological:     General: No focal deficit present.     Mental Status: She is alert and oriented to person, place, and time.     Cranial Nerves: No cranial nerve deficit.     Comments: Antalgic gait, not assisted.  Psychiatric:        Mood and Affect: Mood is  anxious.   ASSESSMENT AND PLAN:  Ms.Taleigha was seen today for follow-up and nausea.  Diagnoses and all orders for this visit: Orders Placed This Encounter  Procedures   DG Abd 2 Views   CBC with Differential/Platelet   Lipase   Comprehensive metabolic panel   Ambulatory referral to Podiatry   POCT urine pregnancy   Lab Results  Component Value Date   CREATININE 0.72 10/22/2021   BUN 16 10/22/2021   NA 135 10/22/2021   K 3.9 10/22/2021   CL 96 10/22/2021   CO2 25 10/22/2021   Lab Results  Component Value Date   ALT 22 10/22/2021   AST 29 10/22/2021   ALKPHOS 113 10/22/2021   BILITOT 1.1 10/22/2021   Lab Results  Component Value Date   LIPASE 13.0 10/22/2021   Lab Results  Component Value Date   WBC 9.5 10/22/2021   HGB 15.6 (H) 10/22/2021   HCT 46.5 (H) 10/22/2021   MCV 96.1 10/22/2021   PLT 379.0 10/22/2021   Abdominal pain, LLQ We discussed possible etiologies. Pain was not elicited during examination today. Peristalsis slightly decreased. Plain abdominal x-ray ordered today. She was clearly instructed about warning signs and she understands that if x-ray has signs of possible obstruction, she will be instructed to go to the ER.  Abdominal X ray I do not appreciate air-fluid levels or loop dilation. Moderate amount of stool in colon.  Nausea and vomiting in adult Greatly improved after ondansetron 4 mg IM x1, which was given after verbal consent. She will continue Zofran 4 mg every 8 hours as needed for nausea and vomiting. Recommend liquid/soft diet for 24 hours and advance as tolerated. Small and frequent sips throughout the day  for oral hydration. Instructed about warning signs.  -     ondansetron (ZOFRAN-ODT) 4 MG disintegrating tablet; Take 1 tablet (4 mg total) by mouth every 8 (eight) hours as needed for up to 5 days for nausea or vomiting. -     Lipase  Constipation, unspecified constipation type Getting worse. For now we will recommend  MiraLAX daily. Adequate hydration. Missed colonoscopy appt 07/25/21.  Foreign body in left foot, initial encounter After verbal consent I tried to remove foreign body (scraping area with a needle and try to get it with a needle holder) , unsuccessful. Recommend appointment with podiatrist. Continue appropriate foot care.  Hyperlipidemia associated with type 2 diabetes mellitus (HCC) FLP has not been checked since 02/2018. She is not fasting today and has an appt with her cardiologist tomorrow. Continue Atorvastatin 40 mg daily.  Anxiety disorder She has not taken Celexa 40 mg for 2 weeks.  Medication has helped with problem. Resume Celexa same dose. Stressed the importance of following as recommended.  If symptoms continue being well controlled with medication, it is appropriate to follow annually.  Type 2 diabetes mellitus with hyperglycemia, with long-term current use of insulin [facility]) Following with endocrinologist.  I spent a total of 59 minutes in both face to face and non face to face activities for this visit on the date of this encounter (excluding the time she was on observation after Zofran IM). During this time history was obtained and documented, examination was performed, prior labs reviewed, and assessment/plan discussed.  Return in about 1 week (around 10/29/2021).  [facility]lee Campus G. [place], MD  [facility]. [place] office.

## 2021-10-22 ENCOUNTER — Ambulatory Visit: Payer: BC Managed Care – PPO | Admitting: Podiatry

## 2021-10-22 ENCOUNTER — Encounter: Payer: Self-pay | Admitting: Family Medicine

## 2021-10-22 ENCOUNTER — Ambulatory Visit (INDEPENDENT_AMBULATORY_CARE_PROVIDER_SITE_OTHER): Payer: BC Managed Care – PPO

## 2021-10-22 ENCOUNTER — Ambulatory Visit (INDEPENDENT_AMBULATORY_CARE_PROVIDER_SITE_OTHER): Payer: BC Managed Care – PPO | Admitting: Family Medicine

## 2021-10-22 VITALS — BP 130/80 | HR 76 | Temp 98.5°F | Resp 12 | Ht 66.25 in | Wt 266.0 lb

## 2021-10-22 DIAGNOSIS — R1032 Left lower quadrant pain: Secondary | ICD-10-CM

## 2021-10-22 DIAGNOSIS — E1165 Type 2 diabetes mellitus with hyperglycemia: Secondary | ICD-10-CM

## 2021-10-22 DIAGNOSIS — S90852A Superficial foreign body, left foot, initial encounter: Secondary | ICD-10-CM

## 2021-10-22 DIAGNOSIS — Z794 Long term (current) use of insulin: Secondary | ICD-10-CM

## 2021-10-22 DIAGNOSIS — K5900 Constipation, unspecified: Secondary | ICD-10-CM

## 2021-10-22 DIAGNOSIS — R112 Nausea with vomiting, unspecified: Secondary | ICD-10-CM

## 2021-10-22 DIAGNOSIS — E785 Hyperlipidemia, unspecified: Secondary | ICD-10-CM | POA: Diagnosis not present

## 2021-10-22 DIAGNOSIS — E1169 Type 2 diabetes mellitus with other specified complication: Secondary | ICD-10-CM

## 2021-10-22 DIAGNOSIS — F418 Other specified anxiety disorders: Secondary | ICD-10-CM

## 2021-10-22 MED ORDER — ONDANSETRON 4 MG PO TBDP: 4.0000 mg | ORAL_TABLET | Freq: Three times a day (TID) | ORAL | 0 refills | Status: AC | PRN

## 2021-10-22 MED ORDER — ATORVASTATIN CALCIUM 40 MG PO TABS: ORAL_TABLET | ORAL | 2 refills | Status: DC

## 2021-10-22 MED ORDER — ONDANSETRON HCL 4 MG/2ML IJ SOLN
4.0000 mg | Freq: Once | INTRAMUSCULAR | Status: AC
  Administered 2021-10-22: 4 mg via INTRAMUSCULAR

## 2021-10-22 MED ORDER — CITALOPRAM HYDROBROMIDE 40 MG PO TABS: 40.0000 mg | ORAL_TABLET | Freq: Every day | ORAL | 3 refills | Status: DC

## 2021-10-22 NOTE — Assessment & Plan Note (Addendum)
She has not taken Celexa 40 mg for 2 weeks.  Medication has helped with problem. Resume Celexa same dose. Stressed the importance of following as recommended.  If symptoms continue being well controlled with medication, it is appropriate to follow annually.

## 2021-10-22 NOTE — Patient Instructions (Addendum)
A few things to remember from today's visit:   Abdominal pain, LLQ - Plan: CBC with Differential/Platelet, Comprehensive metabolic panel, DG Abd 2 Views, POCT urine pregnancy  Type 2 diabetes mellitus with hyperglycemia, with long-term current use of insulin (HCC), Chronic  Hyperlipidemia associated with type 2 diabetes mellitus (HCC) - Plan: Comprehensive metabolic panel  Anxiety disorder, unspecified type  Nausea and vomiting in adult - Plan: ondansetron (ZOFRAN) injection 4 mg, Lipase, DG Abd 2 Views, POCT urine pregnancy  Constipation, unspecified constipation type - Plan: DG Abd 2 Views  Foreign body in left foot, initial encounter - Plan: Ambulatory referral to Podiatry  Other specified anxiety disorders - Plan: citalopram (CELEXA) 40 MG tablet  If you need refills please call your pharmacy. Do not use My Chart to request refills or for acute issues that need immediate attention.   Miralax daily and oral hydration. Soft diet for 24 hours and start advancing as tolerated. If symptoms worse you need to go to the ED. Zofran every 8 hours.  Please be sure medication list is accurate. If a new problem present, please set up appointment sooner than planned today.  Constipation, Adult Constipation is when a person has trouble pooping (having a bowel movement). When you have this condition, you may poop fewer than 3 times a week. Your poop (stool) may also be dry, hard, or bigger than normal. Follow these instructions at home: Eating and drinking  Eat foods that have a lot of fiber, such as: Fresh fruits and vegetables. Whole grains. Beans. Eat less of foods that are low in fiber and high in fat and sugar, such as: [place] fries. Hamburgers. Cookies. Candy. Soda. Drink enough fluid to keep your pee (urine) pale yellow. General instructions Exercise regularly or as told by your doctor. Try to do 150 minutes of exercise each week. Go to the restroom when you feel like you  need to poop. Do not hold it in. Take over-the-counter and prescription medicines only as told by your doctor. These include any fiber supplements. When you poop: Do deep breathing while relaxing your lower belly (abdomen). Relax your pelvic floor. The pelvic floor is a group of muscles that support the rectum, bladder, and intestines (as well as the uterus in women). Watch your condition for any changes. Tell your doctor if you notice any. Keep all follow-up visits as told by your doctor. This is important. Contact a doctor if: You have pain that gets worse. You have a fever. You have not pooped for 4 days. You vomit. You are not hungry. You lose weight. You are bleeding from the opening of the butt (anus). You have thin, pencil-like poop. Get help right away if: You have a fever, and your symptoms suddenly get worse. You leak poop or have blood in your poop. Your belly feels hard or bigger than normal (bloated). You have very bad belly pain. You feel dizzy or you faint. Summary Constipation is when a person poops fewer than 3 times a week, has trouble pooping, or has poop that is dry, hard, or bigger than normal. Eat foods that have a lot of fiber. Drink enough fluid to keep your pee (urine) pale yellow. Take over-the-counter and prescription medicines only as told by your doctor. These include any fiber supplements. This information is not intended to replace advice given to you by your health care provider. Make sure you discuss any questions you have with your health care provider. Document Revised: 03/01/2019 Document Reviewed: 03/01/2019 Elsevier  Patient Education  2023 [employer].

## 2021-10-22 NOTE — Assessment & Plan Note (Addendum)
FLP has not been checked since 02/2018. She is not fasting today and has an appt with her cardiologist tomorrow. Continue Atorvastatin 40 mg daily.

## 2021-10-23 ENCOUNTER — Ambulatory Visit (INDEPENDENT_AMBULATORY_CARE_PROVIDER_SITE_OTHER): Payer: BC Managed Care – PPO

## 2021-10-23 ENCOUNTER — Ambulatory Visit: Payer: BC Managed Care – PPO | Admitting: Nurse Practitioner

## 2021-10-23 ENCOUNTER — Encounter: Payer: Self-pay | Admitting: Podiatry

## 2021-10-23 ENCOUNTER — Ambulatory Visit: Payer: BC Managed Care – PPO | Admitting: Podiatry

## 2021-10-23 DIAGNOSIS — S90852A Superficial foreign body, left foot, initial encounter: Secondary | ICD-10-CM | POA: Diagnosis not present

## 2021-10-23 LAB — COMPREHENSIVE METABOLIC PANEL
ALT: 22 U/L (ref 0–35)
AST: 29 U/L (ref 0–37)
Albumin: 4.4 g/dL (ref 3.5–5.2)
Alkaline Phosphatase: 113 U/L (ref 39–117)
BUN: 16 mg/dL (ref 6–23)
CO2: 25 mEq/L (ref 19–32)
Calcium: 10.5 mg/dL (ref 8.4–10.5)
Chloride: 96 mEq/L (ref 96–112)
Creatinine, Ser: 0.72 mg/dL (ref 0.40–1.20)
GFR: 95.09 mL/min (ref >60.00)
Glucose, Bld: 199 mg/dL — ABNORMAL HIGH (ref 70–99)
Potassium: 3.9 mEq/L (ref 3.5–5.1)
Sodium: 135 mEq/L (ref 135–145)
Total Bilirubin: 1.1 mg/dL (ref 0.2–1.2)
Total Protein: 8.5 g/dL — ABNORMAL HIGH (ref 6.0–8.3)

## 2021-10-23 LAB — CBC WITH DIFFERENTIAL/PLATELET
Basophils Absolute: 0 10*3/uL (ref 0.0–0.1)
Basophils Relative: 0.5 % (ref 0.0–3.0)
Eosinophils Absolute: 0.1 10*3/uL (ref 0.0–0.7)
Eosinophils Relative: 1.2 % (ref 0.0–5.0)
HCT: 46.5 % — ABNORMAL HIGH (ref 36.0–46.0)
Hemoglobin: 15.6 g/dL — ABNORMAL HIGH (ref 12.0–15.0)
Lymphocytes Relative: 29.9 % (ref 12.0–46.0)
Lymphs Abs: 2.8 10*3/uL (ref 0.7–4.0)
MCHC: 33.5 g/dL (ref 30.0–36.0)
MCV: 96.1 fl (ref 78.0–100.0)
Monocytes Absolute: 0.6 10*3/uL (ref 0.1–1.0)
Monocytes Relative: 6.9 % (ref 3.0–12.0)
Neutro Abs: 5.8 10*3/uL (ref 1.4–7.7)
Neutrophils Relative %: 61.5 % (ref 43.0–77.0)
Platelets: 379 10*3/uL (ref 150.0–400.0)
RBC: 4.84 Mil/uL (ref 3.87–5.11)
RDW: 13.6 % (ref 11.5–15.5)
WBC: 9.5 10*3/uL (ref 4.0–10.5)

## 2021-10-23 LAB — LIPASE: Lipase: 13 U/L (ref 11.0–59.0)

## 2021-10-23 NOTE — Progress Notes (Deleted)
Cardiology Office Note:    Date:  10/23/2021   ID:  Heather Myers, DOB 02/19/1966, MRN [id number]  PCP:  [place], Betty G, MD   [facility] Providers Cardiologist:  Lesleigh Noe, MD { Click to update primary MD,subspecialty MD or APP then REFRESH:1}    Referring MD: [place], Betty G, MD   Chief Complaint: ***  History of Present Illness:    Heather Myers is a *** 56 y.o. female with a hx of morbid obesity, fluid retention, mild OSA by prior sleep study, diabetes, hypertension, hyperlipidemia, and iron deficiency anemia (related to prior heavy menstruation).    Has been followed by cardiology for dyspnea.  Nuclear stress test 07/2010 showed no ischemia, probably motion artifact, possibility of EF abnormality, but clarified with normal echo.  Last 2D echo 05/2016 showed EF 55 to 60%, grade 1 DD.  History of fluid retention treated with Lasix.  Request in 2018 from dermatology to add spironolactone as a regimen to control hair loss and acne.  At that time Lasix was decreased to 20 mg daily and spironolactone was added at 50 mg daily.  History of palpitations, on metoprolol.  She was last seen in our office on 11/08/2019 by Norma Fredrickson, NP.  Medications were refilled and she was advised to follow-up in 1 year.  Today, she is here   Past Medical History:  Diagnosis Date   Allergic rhinitis    Anemia    Anxiety    Arthritis    Asthma, mild intermittent 12/19/2015   uses inhaler as needed   Complication of anesthesia    difficulty waking up from anesthesia   Depression    Diabetes mellitus type 2 in obese [facility])    Echocardiogram shows left ventricular diastolic dysfunction    Fluid retention    Hyperlipidemia    on meds   Hypertension    on meds   Morbid obesity (HCC)    Pneumonia    history of   Seasonal allergies    Sleep apnea    mild sleep, no CPAP needed at this time    Past Surgical History:  Procedure Laterality Date   ABDOMINAL SURGERY      chronic abd wall abscess   CESAREAN SECTION     x's 2   CHOLECYSTECTOMY     DILATATION & CURETTAGE/HYSTEROSCOPY WITH MYOSURE N/A 12/08/2016   Procedure: DILATATION & CURETTAGE/HYSTEROSCOPY WITH MYOSURE;  Surgeon: Romualdo Bolk, MD;  Location: [facility];  Service: Gynecology;  Laterality: N/A;   INTRAUTERINE DEVICE (IUD) INSERTION N/A 12/08/2016   Procedure: MIRENA INTRAUTERINE DEVICE (IUD) INSERTION (IUD FROM OFFICE);  Surgeon: Romualdo Bolk, MD;  Location: [facility];  Service: Gynecology;  Laterality: N/A;   KNEE ARTHROSCOPY Right    LIPOMA EXCISION  08/03/2011   Procedure: EXCISION LIPOMA;  Surgeon: Shelly Rubenstein, MD;  Location: [facility];  Service: General;  Laterality: Right;  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     Allergies:   Ramipril   Social History   Socioeconomic History   Marital status: Legally Separated    Spouse name: Not on file   Number of children: Not on file   Years of education: Not on file   Highest  education level: Not on file  Occupational History   Not on file  Tobacco Use   Smoking status: Never   Smokeless tobacco: Never  Vaping Use   Vaping Use: Never used  Substance and Sexual Activity   Alcohol use: Not Currently   Drug use: No   Sexual activity: Not Currently    Partners: Male    Birth control/protection: I.U.D.  Other Topics Concern   Not on file  Social History Narrative   Not on file   Social Determinants of Health   Financial Resource Strain: Not on file  Food Insecurity: Not on file  Transportation Needs: Not on file  Physical Activity: Not on file  Stress: Not on file  Social Connections: Not on file     Family History: The patient's ***family history includes Cancer in her  father; Diabetes in her mother and another family member; Hypertension in her father and mother; Other in her mother. There is no history of Colon polyps, Colon cancer, Esophageal cancer, Rectal cancer, or Stomach cancer.  ROS:   Please see the history of present illness.    *** All other systems reviewed and are negative.  Labs/Other Studies Reviewed:    The following studies were reviewed today:  Echo 05/2016  - Left ventricle: The cavity size was normal. Systolic function was    normal. The estimated ejection fraction was in the range of 55%    to 60%. Wall motion was normal; there were no regional wall    motion abnormalities. Doppler parameters are consistent with    abnormal left ventricular relaxation (grade 1 diastolic    dysfunction). Acoustic contrast opacification revealed no    evidence ofthrombus.    Recent Labs: No results found for requested labs within last 365 days.  Recent Lipid Panel    Component Value Date/Time   CHOL 156 03/03/2018 1312   TRIG 188 (H) 03/03/2018 1312   HDL 43 03/03/2018 1312   CHOLHDL 3.6 03/03/2018 1312   CHOLHDL 6 06/15/2017 1444   VLDL 25.8 06/15/2017 1444   LDLCALC 75 03/03/2018 1312     Risk Assessment/Calculations:   {Does this patient have ATRIAL FIBRILLATION?:[id number]}       Physical Exam:    VS:  There were no vitals taken for this visit.    Wt Readings from Last 3 Encounters:  10/22/21 266 lb (120.7 kg)  06/05/21 268 lb 9.6 oz (121.8 kg)  05/05/21 265 lb (120.2 kg)     GEN: *** Well nourished, well developed in no acute distress HEENT: Normal NECK: No JVD; No carotid bruits CARDIAC: ***RRR, no murmurs, rubs, gallops RESPIRATORY:  Clear to auscultation without rales, wheezing or rhonchi  ABDOMEN: Soft, non-tender, non-distended MUSCULOSKELETAL:  No edema; No deformity. *** pedal pulses, ***bilaterally SKIN: Warm and dry NEUROLOGIC:  Alert and oriented x 3 PSYCHIATRIC:  Normal affect   EKG:  EKG is ***  ordered today.  The ekg ordered today demonstrates ***  Diagnoses:    No diagnosis found. Assessment and Plan:     Hypertension:  Chronic dyspnea/diastolic dysfunction:  Morbid obesity:  Hyperlipidemia:  {Are you ordering a CV Procedure (e.g. stress test, cath, DCCV, TEE, etc)?   Press F2        :[id number]}    Medication Adjustments/Labs and Tests Ordered: Current medicines are reviewed at length with the patient today.  Concerns regarding medicines are outlined above.  No orders of the defined types were placed in this encounter.  No orders of  the defined types were placed in this encounter.   There are no Patient Instructions on file for this visit.   Signed, Emmaline Life, NP  10/23/2021 6:22 AM    [facility]

## 2021-10-23 NOTE — Progress Notes (Signed)
Subjective:  Patient ID: Heather Myers, female    DOB: [birth year],  MRN: [id number] HPI Chief Complaint  Patient presents with   Foot Pain    Plantar forefoot left - possible foreign body, pain started Saturday, but doesn't remember stepping on anything, very tender, PCP tried to remove-unsuccessful   Diabetes    Last a1c was 7.8   New Patient (Initial Visit)    56 y.o. female presents with the above complaint.   ROS: Denies fever chills nausea vomiting muscle aches pains calf pain back pain chest pain shortness of breath.  Past Medical History:  Diagnosis Date   Allergic rhinitis    Anemia    Anxiety    Arthritis    Asthma, mild intermittent 12/19/2015   uses inhaler as needed   Complication of anesthesia    difficulty waking up from anesthesia   Depression    Diabetes mellitus type 2 in obese [facility])    Echocardiogram shows left ventricular diastolic dysfunction    Fluid retention    Hyperlipidemia    on meds   Hypertension    on meds   Morbid obesity ([place])    Pneumonia    history of   Seasonal allergies    Sleep apnea    mild sleep, no CPAP needed at this time   Past Surgical History:  Procedure Laterality Date   ABDOMINAL SURGERY     chronic abd wall abscess   CESAREAN SECTION     x's 2   CHOLECYSTECTOMY     DILATATION & CURETTAGE/HYSTEROSCOPY WITH MYOSURE N/A 12/08/2016   Procedure: DILATATION & CURETTAGE/HYSTEROSCOPY WITH MYOSURE;  Surgeon: Salvadore Dom, MD;  Location: [facility] [place] [facility];  Service: Gynecology;  Laterality: N/A;   INTRAUTERINE DEVICE (IUD) INSERTION N/A 12/08/2016   Procedure: MIRENA INTRAUTERINE DEVICE (IUD) INSERTION (IUD FROM OFFICE);  Surgeon: Salvadore Dom, MD;  Location: [facility];  Service: Gynecology;  Laterality: N/A;   KNEE ARTHROSCOPY Right    LIPOMA EXCISION  08/03/2011   Procedure: EXCISION LIPOMA;  Surgeon: Harl Bowie, MD;  Location: [place];  Service: General;  Laterality: Right;  wide excision  chronic abscess Right lower abdominal wall   OOPHORECTOMY     R ovary removed at [facility]      Current Outpatient Medications:    albuterol (VENTOLIN HFA) 108 (90 Base) MCG/ACT inhaler, TAKE 2 PUFFS BY MOUTH EVERY 6 HOURS AS NEEDED FOR WHEEZE OR SHORTNESS OF BREATH, Disp: 8.5 g, Rfl: 1   atorvastatin (LIPITOR) 40 MG tablet, TAKE 1 TABLET BY MOUTH DAILY. NEEDS OFFICE VISIT AND LABS, Disp: 90 tablet, Rfl: 2   BAYER MICROLET LANCETS lancets, Use as instructed to check sugar up to 6 times daily, Disp: 600 each, Rfl: 5   Blood Glucose Monitoring Suppl (BAYER CONTOUR MONITOR) w/Device KIT, Use to check sugar daily, Disp: 1 each, Rfl: 0   citalopram (CELEXA) 40 MG tablet, Take 1 tablet (40 mg total) by mouth daily., Disp: 90 tablet, Rfl: 3   Estradiol 10 MCG TABS vaginal tablet, Place one tablet vaginally qhs x 1 week, then change to 2 x a week at hs., Disp: 24 tablet, Rfl: 4   furosemide (LASIX) 20 MG tablet, Take 1 tablet (20 mg total) by mouth daily. Pt needs to keep upcoming appt in June 2023 with Cardiologist before anymore refills. Final Attempt, Disp: 30 tablet, Rfl: 0   glucose blood (BAYER CONTOUR TEST) test strip, Use as instructed to check sugar 6  times daily, Disp: 600 each, Rfl: 5   insulin aspart (NOVOLOG FLEXPEN) 100 UNIT/ML FlexPen, INJECT 25-30 UNITS 3 TIMES A DAY *15 MINUTES BEFORE MEALS*, Disp: 24 mL, Rfl: 1   Insulin Pen Needle 31G X 5 MM MISC, Use 4x a day, Disp: 400 each, Rfl: 3   INVOKANA 300 MG TABS tablet, TAKE 1 TABLET BY MOUTH EVERY DAY, Disp: 90 tablet, Rfl: 3   metFORMIN (GLUCOPHAGE-XR) 500 MG 24 hr tablet, TAKE 2 TABLETS BY MOUTH TWICE A DAY, Disp: 360 tablet, Rfl: 0   metoprolol succinate (TOPROL-XL) 25 MG 24 hr tablet, TAKE 1 TABLET BY MOUTH EVERY DAY WITH 50MG TABLET FOR TOTAL OF 75MG, Disp: 90 tablet, Rfl: 3   metoprolol succinate (TOPROL-XL) 50 MG 24 hr tablet, TAKE 1 TABLET BY MOUTH EVERY DAY WITH 25MG TABLET (75MG TOTAL) WITH OR IMMEDIATELY FOLLOWING A MEAL,  Disp: 90 tablet, Rfl: 3   Multiple Vitamins-Minerals (MULTIVITAMIN WITH MINERALS) tablet, Take 1 tablet by mouth daily., Disp: , Rfl:    ondansetron (ZOFRAN-ODT) 4 MG disintegrating tablet, Take 1 tablet (4 mg total) by mouth every 8 (eight) hours as needed for up to 5 days for nausea or vomiting., Disp: 15 tablet, Rfl: 0   Semaglutide, 1 MG/DOSE, (OZEMPIC, 1 MG/DOSE,) 4 MG/3ML SOPN, Inject into the skin., Disp: , Rfl:    spironolactone (ALDACTONE) 25 MG tablet, Take 1 tablet (25 mg total) by mouth 2 (two) times daily. Please keep upcoming appt for future refills., Disp: 180 tablet, Rfl: 0   TOUJEO SOLOSTAR 300 UNIT/ML SOPN, INJECT 75 UNITS INTO THE SKIN DAILY, Disp: 22.5 mL, Rfl: 2   traMADol (ULTRAM) 50 MG tablet, Take 1 tablet (50 mg total) by mouth every 6 (six) hours as needed., Disp: 30 tablet, Rfl: 0   valACYclovir (VALTREX) 500 MG tablet, Take one tablet daily, increase to one tablet po BID x 3 days prn outbreak, Disp: 90 tablet, Rfl: 4  Allergies  Allergen Reactions   Ramipril Shortness Of Breath   Review of Systems Objective:  There were no vitals filed for this visit.  General: Well developed, nourished, in no acute distress, alert and oriented x3   Dermatological: Skin is warm, dry and supple bilateral. Nails x 10 are well maintained; remaining integument appears unremarkable at this time. There are no open sores, no preulcerative lesions, no rash or signs of infection present.  She has a small puncture wound beneath her third toe in the pad of the forefoot.  Does not demonstrate any erythema edema cellulitis drainage or odor just a small dark puncture wound.  Attempts have been made by the primary care provider to remove any foreign body was not able to do so most likely some of this is blood.  Vascular: Dorsalis Pedis artery and Posterior Tibial artery pedal pulses are 2/4 bilateral with immedate capillary fill time. Pedal hair growth present. No varicosities and no lower extremity  edema present bilateral.   Neruologic: Grossly intact via light touch bilateral. Vibratory intact via tuning fork bilateral. Protective threshold with Semmes Wienstein monofilament intact to all pedal sites bilateral. Patellar and Achilles deep tendon reflexes 2+ bilateral. No Babinski or clonus noted bilateral.   Musculoskeletal: No gross boney pedal deformities bilateral. No pain, crepitus, or limitation noted with foot and ankle range of motion bilateral. Muscular strength 5/5 in all groups tested bilateral.  Gait: Unassisted, Nonantalgic.    Radiographs:  Radiographs taken today do demonstrate a very small foreign body in the pad of the forefoot  consistent with either a piece of glass or metal but it is just over a millimeter in length.  Assessment & Plan:   Assessment: Foreign body left foot.  Plan: After sterile Betadine skin prep I was able to debride the skin and carefully remove a small piece of what appeared to be a metal spur.  There is no purulence no malodor no signs of infection.  I had her encouraged her to cover with a Band-Aid and antibiotic ointment soak in a little Epsom salts and warm water and watch for signs and symptoms of infection.  If there are any she will notify us immediately or be directed to the emergency department.     Bluford Sedler T. [place], [place]

## 2021-11-11 ENCOUNTER — Other Ambulatory Visit: Payer: Self-pay | Admitting: Interventional Cardiology

## 2021-11-11 NOTE — Progress Notes (Signed)
Cardiology Office Note:    Date:  11/13/2021   ID:  Heather Myers, DOB 02-16-1966, MRN [id number]  PCP:  Myers, Heather G, MD  Cardiologist:  Sinclair Grooms, MD   Referring MD: Myers, Heather G, MD   No chief complaint on file.   History of Present Illness:    Heather Myers is a 56 y.o. female with a hx of primary hypertension, dyspnea on exertion, diabetes mellitus II, morbid obesity, family history of kidney failure, and prior diagnosis of mild obstructive sleep apnea not requiring C Pap (56 year old information)..  Doing well.  Not having dyspnea.  I have not personally seen her since 2018.  She is seen extenders.  She chastised is me for not recognizing that she is lost 40 pounds since I saw her last in 2018.  She credits Ozempic for this.  Past Medical History:  Diagnosis Date   Allergic rhinitis    Anemia    Anxiety    Arthritis    Asthma, mild intermittent 12/19/2015   uses inhaler as needed   Complication of anesthesia    difficulty waking up from anesthesia   Depression    Diabetes mellitus type 2 in obese [facility])    Echocardiogram shows left ventricular diastolic dysfunction    Fluid retention    Hyperlipidemia    on meds   Hypertension    on meds   Morbid obesity ([place])    Pneumonia    history of   Seasonal allergies    Sleep apnea    mild sleep, no CPAP needed at this time    Past Surgical History:  Procedure Laterality Date   ABDOMINAL SURGERY     chronic abd wall abscess   CESAREAN SECTION     x's 2   CHOLECYSTECTOMY     DILATATION & CURETTAGE/HYSTEROSCOPY WITH MYOSURE N/A 12/08/2016   Procedure: DILATATION & CURETTAGE/HYSTEROSCOPY WITH MYOSURE;  Surgeon: Salvadore Dom, MD;  Location: [place] [facility];  Service: Gynecology;  Laterality: N/A;   INTRAUTERINE DEVICE (IUD) INSERTION N/A 12/08/2016   Procedure: MIRENA INTRAUTERINE DEVICE (IUD) INSERTION (IUD FROM OFFICE);  Surgeon: Salvadore Dom, MD;  Location: [place] [facility];  Service:  Gynecology;  Laterality: N/A;   KNEE ARTHROSCOPY Right    LIPOMA EXCISION  08/03/2011   Procedure: EXCISION LIPOMA;  Surgeon: Harl Bowie, MD;  Location: [place];  Service: General;  Laterality: Right;  wide excision chronic abscess Right lower abdominal wall   OOPHORECTOMY     R ovary removed at [facility]      Current Medications: Current Meds  Medication Sig   albuterol (VENTOLIN HFA) 108 (90 Base) MCG/ACT inhaler TAKE 2 PUFFS BY MOUTH EVERY 6 HOURS AS NEEDED FOR WHEEZE OR SHORTNESS OF BREATH   atorvastatin (LIPITOR) 40 MG tablet TAKE 1 TABLET BY MOUTH DAILY. NEEDS OFFICE VISIT AND LABS   BAYER MICROLET LANCETS lancets Use as instructed to check sugar up to 6 times daily   Blood Glucose Monitoring Suppl (BAYER CONTOUR MONITOR) w/Device KIT Use to check sugar daily   citalopram (CELEXA) 40 MG tablet Take 1 tablet (40 mg total) by mouth daily.   Continuous Blood Gluc Sensor (DEXCOM G7 SENSOR) MISC Apply topically as directed.   dapagliflozin propanediol (FARXIGA) 10 MG TABS tablet Take 1 tablet (10 mg total) by mouth daily before breakfast.   Estradiol 10 MCG TABS vaginal tablet Place one tablet vaginally qhs x 1 week, then change to  2 x a week at hs.   glucose blood (BAYER CONTOUR TEST) test strip Use as instructed to check sugar 6 times daily   insulin aspart (NOVOLOG FLEXPEN) 100 UNIT/ML FlexPen INJECT 25-30 UNITS 3 TIMES A DAY *15 MINUTES BEFORE MEALS*   Insulin Pen Needle 31G X 5 MM MISC Use 4x a day   metFORMIN (GLUCOPHAGE-XR) 500 MG 24 hr tablet TAKE 2 TABLETS BY MOUTH TWICE A DAY   Multiple Vitamins-Minerals (MULTIVITAMIN WITH MINERALS) tablet Take 1 tablet by mouth daily.   Semaglutide, 1 MG/DOSE, (OZEMPIC, 1 MG/DOSE,) 4 MG/3ML SOPN Inject into the skin.   TOUJEO SOLOSTAR 300 UNIT/ML SOPN INJECT 75 UNITS INTO THE SKIN DAILY   traMADol (ULTRAM) 50 MG tablet Take 1 tablet (50 mg total) by mouth every 6 (six) hours as needed.   valACYclovir (VALTREX) 500  MG tablet Take one tablet daily, increase to one tablet po BID x 3 days prn outbreak   [DISCONTINUED] furosemide (LASIX) 20 MG tablet Take 1 tablet (20 mg total) by mouth daily. Pt needs to keep upcoming appt in June 2023 with Cardiologist before anymore refills. Final Attempt   [DISCONTINUED] INVOKANA 300 MG TABS tablet TAKE 1 TABLET BY MOUTH EVERY DAY   [DISCONTINUED] metoprolol succinate (TOPROL-XL) 25 MG 24 hr tablet TAKE 1 TABLET BY MOUTH EVERY DAY WITH 50MG TABLET FOR TOTAL OF 75MG   [DISCONTINUED] metoprolol succinate (TOPROL-XL) 50 MG 24 hr tablet TAKE 1 TABLET BY MOUTH EVERY DAY WITH 25MG TABLET (75MG TOTAL) WITH OR IMMEDIATELY FOLLOWING A MEAL   [DISCONTINUED] spironolactone (ALDACTONE) 25 MG tablet Take 1 tablet (25 mg total) by mouth 2 (two) times daily. Pt needs to keep upcoming appt in July for further refills     Allergies:   Ramipril   Social History   Socioeconomic History   Marital status: Legally Separated    Spouse name: Not on file   Number of children: Not on file   Years of education: Not on file   Highest education level: Not on file  Occupational History   Not on file  Tobacco Use   Smoking status: Never   Smokeless tobacco: Never  Vaping Use   Vaping Use: Never used  Substance and Sexual Activity   Alcohol use: Not Currently   Drug use: No   Sexual activity: Not Currently    Partners: Male    Birth control/protection: I.U.D.  Other Topics Concern   Not on file  Social History Narrative   Not on file   Social Determinants of Health   Financial Resource Strain: Not on file  Food Insecurity: Not on file  Transportation Needs: Not on file  Physical Activity: Not on file  Stress: Not on file  Social Connections: Not on file     Family History: The patient's family history includes Cancer in her father; Diabetes in her mother and another family member; Hypertension in her father and mother; Other in her mother. There is no history of Colon polyps,  Colon cancer, Esophageal cancer, Rectal cancer, or Stomach cancer.  ROS:   Please see the history of present illness.    Denies orthopnea, lower extremity swelling, dyspnea on exertion.  Feels better with 40 pound weight loss.  All other systems reviewed and are negative.  EKGs/Labs/Other Studies Reviewed:     ECHOCARDIOGRAM 2018 Study Conclusions   - Left ventricle: The cavity size was normal. Systolic function was    normal. The estimated ejection fraction was in the range of 55%  to 60%. Wall motion was normal; there were no regional wall    motion abnormalities. Doppler parameters are consistent with    abnormal left ventricular relaxation (grade 1 diastolic    dysfunction). Acoustic contrast opacification revealed no    evidence ofthrombus.   EKG:  EKG performed November 14, 2019 demonstrates normal sinus rhythm, atrial abnormality, poor R wave progression.  Today's tracing is very similar at a heart rate of 101 bpm.  Biatrial abnormality is noted.  No significant change other than increased heart rate.  Recent Labs: 10/22/2021: ALT 22; BUN 16; Creatinine, Ser 0.72; Hemoglobin 15.6; Platelets 379.0; Potassium 3.9; Sodium 135  Recent Lipid Panel    Component Value Date/Time   CHOL 156 03/03/2018 1312   TRIG 188 (H) 03/03/2018 1312   HDL 43 03/03/2018 1312   CHOLHDL 3.6 03/03/2018 1312   CHOLHDL 6 06/15/2017 1444   VLDL 25.8 06/15/2017 1444   LDLCALC 75 03/03/2018 1312    Physical Exam:    VS:  BP 112/62   Pulse (!) 101   Ht '5\' 5"'  (1.651 m)   Wt 266 lb (120.7 kg)   SpO2 96%   BMI 44.26 kg/m     Wt Readings from Last 3 Encounters:  11/13/21 266 lb (120.7 kg)  10/22/21 266 lb (120.7 kg)  06/05/21 268 lb 9.6 oz (121.8 kg)     GEN: Morbid but improved. No acute distress HEENT: Normal NECK: No JVD. LYMPHATICS: No lymphadenopathy CARDIAC: No murmur. RRR heart rate is slightly faster than normal.  No gallop, or edema. VASCULAR:  Normal Pulses. No  bruits. RESPIRATORY:  Clear to auscultation without rales, wheezing or rhonchi  ABDOMEN: Soft, non-tender, non-distended, No pulsatile mass, MUSCULOSKELETAL: No deformity  SKIN: Warm and dry NEUROLOGIC:  Alert and oriented x 3 PSYCHIATRIC:  Normal affect   ASSESSMENT:    1. Diastolic dysfunction   2. Hyperlipidemia associated with type 2 diabetes mellitus ([place])   3. Essential hypertension   4. Morbid obesity ([place])   5. Controlled type 2 diabetes mellitus without complication, without long-term current use of insulin (HCC)    PLAN:    In order of problems listed above:  Repeat 2D Doppler echocardiogram to compare systolic and diastolic function with a 6568 study.  Somewhat concerned about her resting increase in heart rate on 75 mg of Toprol-XL.  She is on [place]. Continue Lipitor 40 mg/day. Excellent blood pressure control on Toprol-XL, furosemide, and Aldactone. Continue Ozempic Continue Ozempic, insulin, weight loss, exercise.   Guideline directed therapy for left ventricular diastolic dysfunction: Angiotensin receptor-neprilysin inhibitor (ARNI)-Entresto; Mineralocorticoid receptor antagonist (MRA) therapy -spironolactone or eplerenone.  SGLT-2 agents -  Dapagliflozin [place]) or Empagliflozin ([place]).These therapies have been shown to improve clinical outcomes including reduction of rehospitalization, survival, and acute heart failure.    Medication Adjustments/Labs and Tests Ordered: Current medicines are reviewed at length with the patient today.  Concerns regarding medicines are outlined above.  Orders Placed This Encounter  Procedures   EKG 12-Lead   ECHOCARDIOGRAM COMPLETE   Meds ordered this encounter  Medications   furosemide (LASIX) 20 MG tablet    Sig: Take 1 tablet (20 mg total) by mouth daily. Pt needs to keep upcoming appt in June 2023 with Cardiologist before anymore refills. Final Attempt    Dispense:  90 tablet    Refill:  3    Pt needs to keep  upcoming appt in June 2023 with Cardiologist before anymore refills. Final Attempt   metoprolol succinate (TOPROL-XL)  25 MG 24 hr tablet    Sig: TAKE 1 TABLET BY MOUTH EVERY DAY WITH 50MG TABLET FOR TOTAL OF 75MG    Dispense:  90 tablet    Refill:  3    Pt needs to keep upcoming appt in April, 2023 with Cardiologist for further refills   metoprolol succinate (TOPROL-XL) 50 MG 24 hr tablet    Sig: Take with or immediately following a meal.    Dispense:  90 tablet    Refill:  3   spironolactone (ALDACTONE) 25 MG tablet    Sig: Take 1 tablet (25 mg total) by mouth 2 (two) times daily.    Dispense:  180 tablet    Refill:  3   dapagliflozin propanediol (FARXIGA) 10 MG TABS tablet    Sig: Take 1 tablet (10 mg total) by mouth daily before breakfast.    Patient Instructions  Medication Instructions:  Your physician recommends that you continue on your current medications as directed. Please refer to the Current Medication list given to you today.  *If you need a refill on your cardiac medications before your next appointment, please call your pharmacy*  Lab Work: NONE  Testing/Procedures: Your physician has requested that you have an echocardiogram. Echocardiography is a painless test that uses sound waves to create images of your heart. It provides your doctor with information about the size and shape of your heart and how well your heart's chambers and valves are working. This procedure takes approximately one hour. There are no restrictions for this procedure.  Follow-Up: At [facility], you and your health needs are our priority.  As part of our continuing mission to provide you with exceptional heart care, we have created designated Provider Care Teams.  These Care Teams include your primary Cardiologist (physician) and Advanced Practice Providers (APPs -  Physician Assistants and Nurse Practitioners) who all work together to provide you with the care you need, when you need  it.  Your next appointment:   1 year(s)  The format for your next appointment:   In Person  Provider:   Gwyndolyn Kaufman, MD  Important Information About Sugar         Signed, Sinclair Grooms, MD  11/13/2021 11:48 AM    [place]

## 2021-11-13 ENCOUNTER — Other Ambulatory Visit: Payer: Self-pay | Admitting: Interventional Cardiology

## 2021-11-13 ENCOUNTER — Ambulatory Visit: Payer: BC Managed Care – PPO | Admitting: Interventional Cardiology

## 2021-11-13 ENCOUNTER — Encounter: Payer: Self-pay | Admitting: Interventional Cardiology

## 2021-11-13 VITALS — BP 112/62 | HR 101 | Ht 65.0 in | Wt 266.0 lb

## 2021-11-13 DIAGNOSIS — I10 Essential (primary) hypertension: Secondary | ICD-10-CM

## 2021-11-13 DIAGNOSIS — E119 Type 2 diabetes mellitus without complications: Secondary | ICD-10-CM

## 2021-11-13 DIAGNOSIS — I5189 Other ill-defined heart diseases: Secondary | ICD-10-CM | POA: Diagnosis not present

## 2021-11-13 DIAGNOSIS — E1169 Type 2 diabetes mellitus with other specified complication: Secondary | ICD-10-CM | POA: Diagnosis not present

## 2021-11-13 DIAGNOSIS — E785 Hyperlipidemia, unspecified: Secondary | ICD-10-CM

## 2021-11-13 MED ORDER — METOPROLOL SUCCINATE ER 50 MG PO TB24: ORAL_TABLET | ORAL | 3 refills | Status: DC

## 2021-11-13 MED ORDER — METOPROLOL SUCCINATE ER 25 MG PO TB24: ORAL_TABLET | ORAL | 3 refills | Status: DC

## 2021-11-13 MED ORDER — DAPAGLIFLOZIN PROPANEDIOL 10 MG PO TABS: 10.0000 mg | ORAL_TABLET | Freq: Every day | ORAL | Status: DC

## 2021-11-13 MED ORDER — SPIRONOLACTONE 25 MG PO TABS: 25.0000 mg | ORAL_TABLET | Freq: Two times a day (BID) | ORAL | 3 refills | Status: DC

## 2021-11-13 MED ORDER — FUROSEMIDE 20 MG PO TABS: 20.0000 mg | ORAL_TABLET | Freq: Every day | ORAL | 3 refills | Status: DC

## 2021-11-13 NOTE — Patient Instructions (Signed)
Medication Instructions:  Your physician recommends that you continue on your current medications as directed. Please refer to the Current Medication list given to you today.  *If you need a refill on your cardiac medications before your next appointment, please call your pharmacy*  Lab Work: NONE  Testing/Procedures: Your physician has requested that you have an echocardiogram. Echocardiography is a painless test that uses sound waves to create images of your heart. It provides your doctor with information about the size and shape of your heart and how well your heart's chambers and valves are working. This procedure takes approximately one hour. There are no restrictions for this procedure.  Follow-Up: At [facility], you and your health needs are our priority.  As part of our continuing mission to provide you with exceptional heart care, we have created designated Provider Care Teams.  These Care Teams include your primary Cardiologist (physician) and Advanced Practice Providers (APPs -  Physician Assistants and Nurse Practitioners) who all work together to provide you with the care you need, when you need it.  Your next appointment:   1 year(s)  The format for your next appointment:   In Person  Provider:   Laurance Flatten, MD  Important Information About Sugar

## 2021-11-17 ENCOUNTER — Other Ambulatory Visit: Payer: Self-pay | Admitting: Obstetrics and Gynecology

## 2021-11-17 DIAGNOSIS — N952 Postmenopausal atrophic vaginitis: Secondary | ICD-10-CM

## 2021-11-27 ENCOUNTER — Other Ambulatory Visit (HOSPITAL_COMMUNITY): Payer: BC Managed Care – PPO

## 2021-11-27 ENCOUNTER — Encounter: Payer: Self-pay | Admitting: Cardiology

## 2021-11-27 NOTE — Progress Notes (Unsigned)
Patient ID: Heather Myers, female   DOB: [birth year], 56 y.o.   MRN: [id number]  Verified appointment "no show" status with Synetta Fail at 1:01pm.

## 2021-12-02 ENCOUNTER — Ambulatory Visit: Payer: BC Managed Care – PPO | Admitting: Physician Assistant

## 2021-12-04 ENCOUNTER — Encounter (HOSPITAL_COMMUNITY): Payer: Self-pay | Admitting: Interventional Cardiology

## 2021-12-04 ENCOUNTER — Other Ambulatory Visit (HOSPITAL_COMMUNITY): Payer: BC Managed Care – PPO

## 2022-01-17 ENCOUNTER — Other Ambulatory Visit: Payer: Self-pay | Admitting: Obstetrics and Gynecology

## 2022-01-17 DIAGNOSIS — N952 Postmenopausal atrophic vaginitis: Secondary | ICD-10-CM

## 2022-01-19 NOTE — Telephone Encounter (Signed)
JJ pt. Pt now scheduled for annual on 01/26/2022--Claudia also reminded her to schedule her mammogram.

## 2022-01-19 NOTE — Telephone Encounter (Signed)
Last AEX 11/22/20--nothing scheduled.  Last mammo reported-2015-WNL per pt.   Will send pt mychart msg.

## 2022-01-26 ENCOUNTER — Ambulatory Visit: Payer: BC Managed Care – PPO | Admitting: Obstetrics and Gynecology

## 2022-01-26 DIAGNOSIS — Z0289 Encounter for other administrative examinations: Secondary | ICD-10-CM

## 2022-02-04 DIAGNOSIS — H5213 Myopia, bilateral: Secondary | ICD-10-CM | POA: Diagnosis not present

## 2022-02-04 DIAGNOSIS — E119 Type 2 diabetes mellitus without complications: Secondary | ICD-10-CM | POA: Diagnosis not present

## 2022-02-04 LAB — HM DIABETES EYE EXAM

## 2022-02-06 ENCOUNTER — Encounter: Payer: Self-pay | Admitting: Family Medicine

## 2022-02-09 DIAGNOSIS — Z7985 Long-term (current) use of injectable non-insulin antidiabetic drugs: Secondary | ICD-10-CM | POA: Diagnosis not present

## 2022-02-09 DIAGNOSIS — E1165 Type 2 diabetes mellitus with hyperglycemia: Secondary | ICD-10-CM | POA: Diagnosis not present

## 2022-02-09 DIAGNOSIS — Z794 Long term (current) use of insulin: Secondary | ICD-10-CM | POA: Diagnosis not present

## 2022-02-09 DIAGNOSIS — Z6841 Body Mass Index (BMI) 40.0 and over, adult: Secondary | ICD-10-CM | POA: Diagnosis not present

## 2022-02-09 DIAGNOSIS — Z7984 Long term (current) use of oral hypoglycemic drugs: Secondary | ICD-10-CM | POA: Diagnosis not present

## 2022-02-09 LAB — HEMOGLOBIN A1C: Hemoglobin A1C: 7.8

## 2022-03-24 ENCOUNTER — Ambulatory Visit (INDEPENDENT_AMBULATORY_CARE_PROVIDER_SITE_OTHER): Payer: BC Managed Care – PPO | Admitting: Family Medicine

## 2022-03-24 ENCOUNTER — Encounter: Payer: Self-pay | Admitting: Family Medicine

## 2022-03-24 VITALS — BP 120/70 | HR 100 | Temp 98.0°F | Resp 16 | Ht 65.0 in | Wt 273.0 lb

## 2022-03-24 DIAGNOSIS — L258 Unspecified contact dermatitis due to other agents: Secondary | ICD-10-CM | POA: Diagnosis not present

## 2022-03-24 DIAGNOSIS — M1711 Unilateral primary osteoarthritis, right knee: Secondary | ICD-10-CM

## 2022-03-24 DIAGNOSIS — L309 Dermatitis, unspecified: Secondary | ICD-10-CM

## 2022-03-24 MED ORDER — TRIAMCINOLONE ACETONIDE 0.1 % EX CREA: TOPICAL_CREAM | CUTANEOUS | 0 refills | Status: DC

## 2022-03-24 MED ORDER — PREDNISONE 20 MG PO TABS: ORAL_TABLET | ORAL | 0 refills | Status: AC

## 2022-03-24 NOTE — Progress Notes (Unsigned)
ACUTE VISIT No chief complaint on file.  HPI: [name] is a 56 y.o. female with medical history significant for DM 2, asthma, hypertension, and depression here today complaining of pruritic skin rash as described above. HPI ***denies any new medication, detergent, soap, or body product. No known insect bite or outdoor exposures to plants. No sick contact. No Hx of eczema or similar rash in the past.  OTC medication for this problem: ***  Review of Systems See other pertinent positives and negatives in HPI.  Current Outpatient Medications on File Prior to Visit  Medication Sig Dispense Refill   albuterol (VENTOLIN HFA) 108 (90 Base) MCG/ACT inhaler TAKE 2 PUFFS BY MOUTH EVERY 6 HOURS AS NEEDED FOR WHEEZE OR SHORTNESS OF BREATH 8.5 g 1   atorvastatin (LIPITOR) 40 MG tablet TAKE 1 TABLET BY MOUTH DAILY. NEEDS OFFICE VISIT AND LABS 90 tablet 2   BAYER MICROLET LANCETS lancets Use as instructed to check sugar up to 6 times daily 600 each 5   Blood Glucose Monitoring Suppl (BAYER CONTOUR MONITOR) w/Device KIT Use to check sugar daily 1 each 0   citalopram (CELEXA) 40 MG tablet Take 1 tablet (40 mg total) by mouth daily. 90 tablet 3   Continuous Blood Gluc Sensor (DEXCOM G7 SENSOR) MISC Apply topically as directed.     dapagliflozin propanediol (FARXIGA) 10 MG TABS tablet Take 1 tablet (10 mg total) by mouth daily before breakfast.     Estradiol (YUVAFEM) 10 MCG TABS vaginal tablet Place one tablet (10 mcg) per vagina at bedtime twice a week. 8 tablet 0   furosemide (LASIX) 20 MG tablet Take 1 tablet (20 mg total) by mouth daily. Pt needs to keep upcoming appt in June 2023 with Cardiologist before anymore refills. Final Attempt 90 tablet 3   glucose blood (BAYER CONTOUR TEST) test strip Use as instructed to check sugar 6 times daily 600 each 5   insulin aspart (NOVOLOG FLEXPEN) 100 UNIT/ML FlexPen INJECT 25-30 UNITS 3 TIMES A DAY *15 MINUTES BEFORE MEALS* 24 mL 1    Insulin Pen Needle 31G X 5 MM MISC Use 4x a day 400 each 3   metFORMIN (GLUCOPHAGE-XR) 500 MG 24 hr tablet TAKE 2 TABLETS BY MOUTH TWICE A DAY 360 tablet 0   metoprolol succinate (TOPROL-XL) 25 MG 24 hr tablet TAKE 1 TABLET BY MOUTH EVERY DAY WITH 50MG TABLET FOR TOTAL OF 75MG 90 tablet 3   metoprolol succinate (TOPROL-XL) 50 MG 24 hr tablet Take with or immediately following a meal. 90 tablet 3   Multiple Vitamins-Minerals (MULTIVITAMIN WITH MINERALS) tablet Take 1 tablet by mouth daily.     Semaglutide, 1 MG/DOSE, (OZEMPIC, 1 MG/DOSE,) 4 MG/3ML SOPN Inject into the skin.     spironolactone (ALDACTONE) 25 MG tablet Take 1 tablet (25 mg total) by mouth 2 (two) times daily. 180 tablet 3   TOUJEO SOLOSTAR 300 UNIT/ML SOPN INJECT 75 UNITS INTO THE SKIN DAILY 22.5 mL 2   traMADol (ULTRAM) 50 MG tablet Take 1 tablet (50 mg total) by mouth every 6 (six) hours as needed. 30 tablet 0   valACYclovir (VALTREX) 500 MG tablet Take one tablet daily, increase to one tablet po BID x 3 days prn outbreak 90 tablet 4   No current facility-administered medications on file prior to visit.    Past Medical History:  Diagnosis Date   Allergic rhinitis    Anemia    Anxiety    Arthritis    Asthma, mild  intermittent 12/19/2015   uses inhaler as needed   Complication of anesthesia    difficulty waking up from anesthesia   Depression    Diabetes mellitus type 2 in obese [facility])    Echocardiogram shows left ventricular diastolic dysfunction    Fluid retention    Hyperlipidemia    on meds   Hypertension    on meds   Morbid obesity (HCC)    Pneumonia    history of   Seasonal allergies    Sleep apnea    mild sleep, no CPAP needed at this time   Allergies  Allergen Reactions   Ramipril Shortness Of Breath    Social History   Socioeconomic History   Marital status: Legally Separated    Spouse name: Not on file   Number of children: Not on file   Years of education: Not on file   Highest education  level: Not on file  Occupational History   Not on file  Tobacco Use   Smoking status: Never   Smokeless tobacco: Never  Vaping Use   Vaping Use: Never used  Substance and Sexual Activity   Alcohol use: Not Currently   Drug use: No   Sexual activity: Not Currently    Partners: Male    Birth control/protection: I.U.D.  Other Topics Concern   Not on file  Social History Narrative   Not on file   Social Determinants of Health   Financial Resource Strain: Not on file  Food Insecurity: Not on file  Transportation Needs: Not on file  Physical Activity: Not on file  Stress: Not on file  Social Connections: Not on file    There were no vitals filed for this visit. There is no height or weight on file to calculate BMI.  Physical Exam Constitutional:      General: She is not in acute distress.    Appearance: She is well-developed.  HENT:     Head: Atraumatic.  Eyes:     Conjunctiva/sclera: Conjunctivae normal.  Pulmonary:     Effort: Pulmonary effort is normal. No respiratory distress.     Breath sounds: Normal breath sounds.  Lymphadenopathy:     Cervical: No cervical adenopathy.  Skin:    General: Skin is warm.     Findings: Rash present.          Comments: Micropapular confluent, not erythematous.  Neurological:     Mental Status: She is alert and oriented to person, place, and time.  Psychiatric:        Speech: Speech normal.     Comments: Well groomed, good eye contact.     ASSESSMENT AND PLAN: There are no diagnoses linked to this encounter.  No follow-ups on file.  Heather Greenough G. Martinique, MD  [facility]. [place] office.  Discharge Instructions   None

## 2022-03-24 NOTE — Patient Instructions (Addendum)
A few things to remember from today's visit:  Acute eczema - Plan: predniSONE (DELTASONE) 20 MG tablet, triamcinolone cream (KENALOG) 0.1 %  Contact dermatitis due to other agent, unspecified contact dermatitis type - Plan: triamcinolone cream (KENALOG) 0.1 %  Osteoarthritis of right knee, unspecified osteoarthritis type  A cooper sleeve around right knee may help with pain, avoid braces. Follow with ortho is pain is not better. Monitor blood sugars closely. Take Prednisone with breakfast. Benadryl 25 mg at bedtime, Zyrtec 10 mg in the morning and Pepcid 20 mg 2 times daily for 10 days to help with itching.  Is rash is worse or not resolved in a couple weeks, you will nee to see dermatologist.  If you need refills for medications you take chronically, please call your pharmacy. Do not use My Chart to request refills or for acute issues that need immediate attention. If you send a my chart message, it may take a few days to be addressed, specially if I am not in the office.  Please be sure medication list is accurate. If a new problem present, please set up appointment sooner than planned today.

## 2022-04-08 ENCOUNTER — Encounter: Payer: Self-pay | Admitting: Family Medicine

## 2022-04-08 DIAGNOSIS — L309 Dermatitis, unspecified: Secondary | ICD-10-CM

## 2022-04-08 DIAGNOSIS — L258 Unspecified contact dermatitis due to other agents: Secondary | ICD-10-CM

## 2022-04-08 NOTE — Progress Notes (Unsigned)
ACUTE VISIT Chief Complaint  Patient presents with   Eczema   HPI: [name] is a 56 y.o. female PMHx significant for DM 2, asthma, hypertension, anxiety,and depression  here today complaining of persistent pruritic rash. Rash was initially noted around her right knee then forearms and legs after wearing a knee brace to help with knee pain, which has resolved. Rash has spread to back and arms. No associated symptoms. She was seen for this problem on 03/24/22. Dermatology referral has been placed but the earliest she can be seen is 05/2022.  Rash This is a recurrent problem. The current episode started 1 to 4 weeks ago. The problem has been gradually worsening since onset. Associated symptoms include fatigue. Pertinent negatives include no congestion, cough, diarrhea, eye pain, facial edema, joint pain, nail changes, rhinorrhea, shortness of breath, sore throat or vomiting. Past treatments include oral steroids, topical steroids, moisturizer and antihistamine. The treatment provided moderate relief. Her past medical history is significant for asthma and eczema.   She completed treatment with prednisone, topical triamcinolone, and Benadryl. Prednisone seemed to help initially, but the rash has returned, also caused elevation of blood sgars,needed to adjust her insulin. Benadryl provided temporary relief from pruritus but caused drowsiness. She has not been taking Pepcid or Zyrtec.  States that the only new medication is mounjaro, changed from [place], she already had rash at the time she started medication.  She denies any associated fever, sore throat, flu-like symptoms, or insect bites. There are no lesions in her mouth or other mucosa surfaces.  Review of Systems  Constitutional:  Positive for fatigue. Negative for activity change and appetite change.  HENT:  Negative for congestion, rhinorrhea and sore throat.   Eyes:  Negative for pain, discharge and redness.   Respiratory:  Negative for cough and shortness of breath.   Gastrointestinal:  Negative for diarrhea and vomiting.  Genitourinary:  Negative for decreased urine volume, dysuria and hematuria.  Musculoskeletal:  Negative for joint pain, joint swelling and myalgias.  Skin:  Positive for rash. Negative for nail changes.  Neurological:  Negative for syncope, weakness and headaches.  Psychiatric/Behavioral:  Positive for sleep disturbance. The patient is nervous/anxious.   See other pertinent positives and negatives in HPI.  Current Outpatient Medications on File Prior to Visit  Medication Sig Dispense Refill   atorvastatin (LIPITOR) 40 MG tablet TAKE 1 TABLET BY MOUTH DAILY. NEEDS OFFICE VISIT AND LABS 90 tablet 2   BAYER MICROLET LANCETS lancets Use as instructed to check sugar up to 6 times daily 600 each 5   Blood Glucose Monitoring Suppl (BAYER CONTOUR MONITOR) w/Device KIT Use to check sugar daily 1 each 0   citalopram (CELEXA) 40 MG tablet Take 1 tablet (40 mg total) by mouth daily. 90 tablet 3   Continuous Blood Gluc Sensor (DEXCOM G7 SENSOR) MISC Apply topically as directed.     dapagliflozin propanediol (FARXIGA) 10 MG TABS tablet Take 1 tablet (10 mg total) by mouth daily before breakfast.     Estradiol (YUVAFEM) 10 MCG TABS vaginal tablet Place one tablet (10 mcg) per vagina at bedtime twice a week. 8 tablet 0   furosemide (LASIX) 20 MG tablet Take 1 tablet (20 mg total) by mouth daily. Pt needs to keep upcoming appt in June 2023 with Cardiologist before anymore refills. Final Attempt 90 tablet 3   glucose blood (BAYER CONTOUR TEST) test strip Use as instructed to check sugar 6 times daily 600 each 5   insulin aspart (  NOVOLOG FLEXPEN) 100 UNIT/ML FlexPen INJECT 25-30 UNITS 3 TIMES A DAY *15 MINUTES BEFORE MEALS* 24 mL 1   Insulin Pen Needle 31G X 5 MM MISC Use 4x a day 400 each 3   metFORMIN (GLUCOPHAGE-XR) 500 MG 24 hr tablet TAKE 2 TABLETS BY MOUTH TWICE A DAY 360 tablet 0    metoprolol succinate (TOPROL-XL) 25 MG 24 hr tablet TAKE 1 TABLET BY MOUTH EVERY DAY WITH 50MG TABLET FOR TOTAL OF 75MG 90 tablet 3   metoprolol succinate (TOPROL-XL) 50 MG 24 hr tablet Take with or immediately following a meal. 90 tablet 3   Multiple Vitamins-Minerals (MULTIVITAMIN WITH MINERALS) tablet Take 1 tablet by mouth daily.     spironolactone (ALDACTONE) 25 MG tablet Take 1 tablet (25 mg total) by mouth 2 (two) times daily. 180 tablet 3   tirzepatide (MOUNJARO) 7.5 MG/0.5ML Pen Inject 7.5 mg into the skin once a week.     TOUJEO SOLOSTAR 300 UNIT/ML SOPN INJECT 75 UNITS INTO THE SKIN DAILY 22.5 mL 2   traMADol (ULTRAM) 50 MG tablet Take 1 tablet (50 mg total) by mouth every 6 (six) hours as needed. 30 tablet 0   valACYclovir (VALTREX) 500 MG tablet Take one tablet daily, increase to one tablet po BID x 3 days prn outbreak 90 tablet 4   No current facility-administered medications on file prior to visit.    Past Medical History:  Diagnosis Date   Allergic rhinitis    Anemia    Anxiety    Arthritis    Asthma, mild intermittent 12/19/2015   uses inhaler as needed   Complication of anesthesia    difficulty waking up from anesthesia   Depression    Diabetes mellitus type 2 in obese [facility])    Echocardiogram shows left ventricular diastolic dysfunction    Fluid retention    Hyperlipidemia    on meds   Hypertension    on meds   Morbid obesity (HCC)    Pneumonia    history of   Seasonal allergies    Sleep apnea    mild sleep, no CPAP needed at this time   Allergies  Allergen Reactions   Ramipril Shortness Of Breath    Social History   Socioeconomic History   Marital status: Legally Separated    Spouse name: Not on file   Number of children: Not on file   Years of education: Not on file   Highest education level: Not on file  Occupational History   Not on file  Tobacco Use   Smoking status: Never   Smokeless tobacco: Never  Vaping Use   Vaping Use: Never used   Substance and Sexual Activity   Alcohol use: Not Currently   Drug use: No   Sexual activity: Not Currently    Partners: Male    Birth control/protection: I.U.D.  Other Topics Concern   Not on file  Social History Narrative   Not on file   Social Determinants of Health   Financial Resource Strain: Not on file  Food Insecurity: Not on file  Transportation Needs: Not on file  Physical Activity: Not on file  Stress: Not on file  Social Connections: Not on file   Vitals:   04/10/22 1057  BP: 128/80  Pulse: 100  Resp: 16  SpO2: 99%   Body mass index is 45.95 kg/m.  Physical Exam Vitals and nursing note reviewed.  Constitutional:      General: She is not in acute distress.  Appearance: She is well-developed.  HENT:     Head: Normocephalic and atraumatic.     Mouth/Throat:     Mouth: Mucous membranes are moist.     Pharynx: Oropharynx is clear.  Eyes:     Conjunctiva/sclera: Conjunctivae normal.  Cardiovascular:     Rate and Rhythm: Normal rate and regular rhythm.     Heart sounds: No murmur heard. Pulmonary:     Effort: Pulmonary effort is normal. No respiratory distress.     Breath sounds: Normal breath sounds.  Lymphadenopathy:     Cervical: No cervical adenopathy.  Skin:    General: Skin is warm.     Findings: Rash present. No ecchymosis.          Comments: Micropapular, erythematous confluent rash on forearms,behind knees, mid lower back,and around shoulders.  Neurological:     General: No focal deficit present.     Mental Status: She is alert and oriented to person, place, and time.  Psychiatric:     Comments: Well groomed, good eye contact.   ASSESSMENT AND PLAN: Ms. Nissley was seen today for persistent pruritic rash.  Pruritic rash -     hydrOXYzine Pamoate; Take 1 capsule (25 mg total) by mouth every 8 (eight) hours as needed for itching.  Dispense: 45 capsule; Refill: 0 -     Famotidine; Take 1 tablet (20 mg total) by mouth 2 (two)  times daily.  Dispense: 60 tablet; Refill: 0  Eczema, unspecified type -     Clobetasol Propionate; Apply 1 Application topically 2 (two) times daily.  Dispense: 45 g; Refill: 1 -     hydrOXYzine Pamoate; Take 1 capsule (25 mg total) by mouth every 8 (eight) hours as needed for itching.  Dispense: 45 capsule; Refill: 0  Rash is getting worse. I still think it is eczema exacerbation. We discussed other possible etiologies. She does not have systemic symptoms, so I do not think blood work is needed at this time. Pepcic 20 mg bid and Zyrtec 10 mg am recommended. Change Benadryl to Hydroxyzine 25 mg tid prn, mainly at bedtime, 1-2 tabs. Triamcinolone cream changed to Clobetasol cream bid, small amount bid for 14 days at the time. We discussed some side effects of medications. She sees endocrinologist at [facility], dermatologist may be able to see her sooner there, so recommend calling and let us know if a new referral is needed. Instructed about warning signs.  I spent a total of 31 minutes in both face to face and non face to face activities for this visit on the date of this encounter. During this time history was obtained and documented, examination was performed, and assessment/plan discussed.  Return if symptoms worsen or fail to improve, for keep next appointment.  Murry Diaz G. Martinique, MD  [facility]. [place] office.

## 2022-04-10 ENCOUNTER — Encounter: Payer: Self-pay | Admitting: Family Medicine

## 2022-04-10 ENCOUNTER — Ambulatory Visit (INDEPENDENT_AMBULATORY_CARE_PROVIDER_SITE_OTHER): Payer: BC Managed Care – PPO | Admitting: Family Medicine

## 2022-04-10 VITALS — BP 128/80 | HR 100 | Resp 16 | Ht 65.0 in | Wt 276.1 lb

## 2022-04-10 DIAGNOSIS — L282 Other prurigo: Secondary | ICD-10-CM | POA: Diagnosis not present

## 2022-04-10 DIAGNOSIS — L309 Dermatitis, unspecified: Secondary | ICD-10-CM | POA: Diagnosis not present

## 2022-04-10 MED ORDER — CLOBETASOL PROPIONATE 0.05 % EX CREA: 1.0000 | TOPICAL_CREAM | Freq: Two times a day (BID) | CUTANEOUS | 1 refills | Status: DC

## 2022-04-10 MED ORDER — HYDROXYZINE PAMOATE 25 MG PO CAPS: 25.0000 mg | ORAL_CAPSULE | Freq: Three times a day (TID) | ORAL | 0 refills | Status: DC | PRN

## 2022-04-10 MED ORDER — FAMOTIDINE 20 MG PO TABS: 20.0000 mg | ORAL_TABLET | Freq: Two times a day (BID) | ORAL | 0 refills | Status: DC

## 2022-04-10 NOTE — Patient Instructions (Addendum)
A few things to remember from today's visit:  Pruritic rash - Plan: hydrOXYzine (VISTARIL) 25 MG capsule, famotidine (PEPCID) 20 MG tablet  Eczema, unspecified type - Plan: clobetasol cream (TEMOVATE) 0.05 %, hydrOXYzine (VISTARIL) 25 MG capsule  Triamcinolone changed for clobetasol to use small amount 2 times daily for 14 days at time. Hydroxyzine at bedtime mainly, during the day of you are home. Zyrtec 10 mg daily in the morning. Pepcid 20 mg 2 times daily.  Sarna lotion for itching. Call us if you need a new referral to see dermatologist at [facility].  If you need refills for medications you take chronically, please call your pharmacy. Do not use My Chart to request refills or for acute issues that need immediate attention. If you send a my chart message, it may take a few days to be addressed, specially if I am not in the office.  Please be sure medication list is accurate. If a new problem present, please set up appointment sooner than planned today.

## 2022-04-30 DIAGNOSIS — H10413 Chronic giant papillary conjunctivitis, bilateral: Secondary | ICD-10-CM | POA: Diagnosis not present

## 2022-04-30 DIAGNOSIS — H5713 Ocular pain, bilateral: Secondary | ICD-10-CM | POA: Diagnosis not present

## 2022-06-02 ENCOUNTER — Other Ambulatory Visit: Payer: Self-pay | Admitting: Family Medicine

## 2022-06-02 DIAGNOSIS — L282 Other prurigo: Secondary | ICD-10-CM

## 2022-06-02 DIAGNOSIS — L309 Dermatitis, unspecified: Secondary | ICD-10-CM

## 2022-06-02 DIAGNOSIS — L258 Unspecified contact dermatitis due to other agents: Secondary | ICD-10-CM

## 2022-06-17 ENCOUNTER — Encounter: Payer: Self-pay | Admitting: Family Medicine

## 2022-06-17 ENCOUNTER — Telehealth (INDEPENDENT_AMBULATORY_CARE_PROVIDER_SITE_OTHER): Payer: BC Managed Care – PPO | Admitting: Family Medicine

## 2022-06-17 VITALS — Ht 65.0 in | Wt 276.1 lb

## 2022-06-17 DIAGNOSIS — J019 Acute sinusitis, unspecified: Secondary | ICD-10-CM | POA: Diagnosis not present

## 2022-06-17 MED ORDER — AMOXICILLIN-POT CLAVULANATE 875-125 MG PO TABS: 1.0000 | ORAL_TABLET | Freq: Two times a day (BID) | ORAL | 0 refills | Status: DC

## 2022-06-17 NOTE — Progress Notes (Signed)
Patient ID: Heather Myers, female   DOB: May 13, 1965, 57 y.o.   MRN: [id number]   Virtual Visit via Video Note  I connected with Elijio Miles on 06/17/22 at  5:00 PM EST by a video enabled telemedicine application and verified that I am speaking with the correct person using two identifiers.  Location patient: home Location provider:work or home office Persons participating in the virtual visit: patient, provider  I discussed the limitations of evaluation and management by telemedicine and the availability of in person appointments. The patient expressed understanding and agreed to proceed.   HPI: Seen with onset last Tuesday of sore throat which lasted about 2 to 3 days.  Her sore throat seem to be improving but then she developed some mild cough and laryngitis followed by nasal congestion.  COVID test was negative.  No fever.  She has been taking over-the-counter Mucinex without much improvement.  Starting Monday she had some bloody sinus drainage.  No headaches.  Feels worse today than she did several days ago.  No hemoptysis.   ROS: See pertinent positives and negatives per HPI.  Past Medical History:  Diagnosis Date   Allergic rhinitis    Anemia    Anxiety    Arthritis    Asthma, mild intermittent 12/19/2015   uses inhaler as needed   Complication of anesthesia    difficulty waking up from anesthesia   Depression    Diabetes mellitus type 2 in obese [facility])    Echocardiogram shows left ventricular diastolic dysfunction    Fluid retention    Hyperlipidemia    on meds   Hypertension    on meds   Morbid obesity ([place])    Pneumonia    history of   Seasonal allergies    Sleep apnea    mild sleep, no CPAP needed at this time    Past Surgical History:  Procedure Laterality Date   ABDOMINAL SURGERY     chronic abd wall abscess   CESAREAN SECTION     x's 2   CHOLECYSTECTOMY     DILATATION & CURETTAGE/HYSTEROSCOPY WITH MYOSURE N/A 12/08/2016   Procedure:  DILATATION & CURETTAGE/HYSTEROSCOPY WITH MYOSURE;  Surgeon: Salvadore Dom, MD;  Location: [facility];  Service: Gynecology;  Laterality: N/A;   INTRAUTERINE DEVICE (IUD) INSERTION N/A 12/08/2016   Procedure: MIRENA INTRAUTERINE DEVICE (IUD) INSERTION (IUD FROM OFFICE);  Surgeon: Salvadore Dom, MD;  Location: [facility];  Service: Gynecology;  Laterality: N/A;   KNEE ARTHROSCOPY Right    LIPOMA EXCISION  08/03/2011   Procedure: EXCISION LIPOMA;  Surgeon: Harl Bowie, MD;  Location: [facility];  Service: General;  Laterality: Right;  wide excision chronic abscess Right lower abdominal wall   OOPHORECTOMY     R ovary removed at [place] History  Problem Relation Age of Onset   Diabetes Mother    Hypertension Mother    Other Mother        renal failure   Hypertension Father    Cancer Father    Diabetes Other    Colon polyps Neg Hx    Colon cancer Neg Hx    Esophageal cancer Neg Hx    Rectal cancer Neg Hx    Stomach cancer Neg Hx     SOCIAL HX: Non-smoker   Current Outpatient Medications:    amoxicillin-clavulanate (AUGMENTIN) 875-125 MG tablet, Take 1 tablet by mouth 2 (two) times daily., Disp: 20 tablet, Rfl:  0   atorvastatin (LIPITOR) 40 MG tablet, TAKE 1 TABLET BY MOUTH DAILY. NEEDS OFFICE VISIT AND LABS, Disp: 90 tablet, Rfl: 2   BAYER MICROLET LANCETS lancets, Use as instructed to check sugar up to 6 times daily, Disp: 600 each, Rfl: 5   Blood Glucose Monitoring Suppl (BAYER CONTOUR MONITOR) w/Device KIT, Use to check sugar daily, Disp: 1 each, Rfl: 0   citalopram (CELEXA) 40 MG tablet, Take 1 tablet (40 mg total) by mouth daily., Disp: 90 tablet, Rfl: 3   clobetasol cream (TEMOVATE) [id number] %, Apply 1 Application topically 2 (two) times daily., Disp: 45 g, Rfl: 1   Continuous Blood Gluc Sensor (DEXCOM G7 SENSOR) MISC, Apply topically as directed., Disp: , Rfl:    dapagliflozin propanediol (FARXIGA) 10 MG TABS tablet, Take 1 tablet (10 mg  total) by mouth daily before breakfast., Disp: , Rfl:    Estradiol (YUVAFEM) 10 MCG TABS vaginal tablet, Place one tablet (10 mcg) per vagina at bedtime twice a week., Disp: 8 tablet, Rfl: 0   furosemide (LASIX) 20 MG tablet, Take 1 tablet (20 mg total) by mouth daily. Pt needs to keep upcoming appt in June 2023 with Cardiologist before anymore refills. Final Attempt, Disp: 90 tablet, Rfl: 3   glucose blood (BAYER CONTOUR TEST) test strip, Use as instructed to check sugar 6 times daily, Disp: 600 each, Rfl: 5   hydrOXYzine (VISTARIL) 25 MG capsule, Take 1 capsule (25 mg total) by mouth every 8 (eight) hours as needed for itching., Disp: 45 capsule, Rfl: 0   insulin aspart (NOVOLOG FLEXPEN) 100 UNIT/ML FlexPen, INJECT 25-30 UNITS 3 TIMES A DAY *15 MINUTES BEFORE MEALS*, Disp: 24 mL, Rfl: 1   Insulin Pen Needle 31G X 5 MM MISC, Use 4x a day, Disp: 400 each, Rfl: 3   metFORMIN (GLUCOPHAGE-XR) 500 MG 24 hr tablet, TAKE 2 TABLETS BY MOUTH TWICE A DAY, Disp: 360 tablet, Rfl: 0   metoprolol succinate (TOPROL-XL) 25 MG 24 hr tablet, TAKE 1 TABLET BY MOUTH EVERY DAY WITH 50MG TABLET FOR TOTAL OF 75MG, Disp: 90 tablet, Rfl: 3   metoprolol succinate (TOPROL-XL) 50 MG 24 hr tablet, Take with or immediately following a meal., Disp: 90 tablet, Rfl: 3   Multiple Vitamins-Minerals (MULTIVITAMIN WITH MINERALS) tablet, Take 1 tablet by mouth daily., Disp: , Rfl:    spironolactone (ALDACTONE) 25 MG tablet, Take 1 tablet (25 mg total) by mouth 2 (two) times daily., Disp: 180 tablet, Rfl: 3   tirzepatide (MOUNJARO) 7.5 MG/0.5ML Pen, Inject 7.5 mg into the skin once a week., Disp: , Rfl:    TOUJEO SOLOSTAR 300 UNIT/ML SOPN, INJECT 75 UNITS INTO THE SKIN DAILY, Disp: 22.5 mL, Rfl: 2   traMADol (ULTRAM) 50 MG tablet, Take 1 tablet (50 mg total) by mouth every 6 (six) hours as needed., Disp: 30 tablet, Rfl: 0   valACYclovir (VALTREX) 500 MG tablet, Take one tablet daily, increase to one tablet po BID x 3 days prn outbreak,  Disp: 90 tablet, Rfl: 4   famotidine (PEPCID) 20 MG tablet, Take 1 tablet (20 mg total) by mouth 2 (two) times daily., Disp: 60 tablet, Rfl: 0  EXAM:  VITALS per patient if applicable:  GENERAL: alert, oriented, appears well and in no acute distress  HEENT: atraumatic, conjunttiva clear, no obvious abnormalities on inspection of external nose and ears  NECK: normal movements of the head and neck  LUNGS: on inspection no signs of respiratory distress, breathing rate appears normal, no obvious gross  SOB, gasping or wheezing  CV: no obvious cyanosis  MS: moves all visible extremities without noticeable abnormality  PSYCH/NEURO: pleasant and cooperative, no obvious depression or anxiety, speech and thought processing grossly intact  ASSESSMENT AND PLAN:  Discussed the following assessment and plan:  Acute sinusitis, recurrence not specified, unspecified location  -Suspect current infection started off viral but she is now developing bloody sinus drainage and feels somewhat worse.  Start Augmentin 875 mg twice daily for 10 days.  Take with food. -Plenty fluids and rest -Continue nasal saline irrigation -Follow-up for any persistent or worsening symptoms    I discussed the assessment and treatment plan with the patient. The patient was provided an opportunity to ask questions and all were answered. The patient agreed with the plan and demonstrated an understanding of the instructions.   The patient was advised to call back or seek an in-person evaluation if the symptoms worsen or if the condition fails to improve as anticipated.     Carolann Littler, MD

## 2022-07-02 ENCOUNTER — Encounter: Payer: Self-pay | Admitting: Radiology

## 2022-07-11 ENCOUNTER — Other Ambulatory Visit: Payer: Self-pay | Admitting: Family Medicine

## 2022-07-11 DIAGNOSIS — F418 Other specified anxiety disorders: Secondary | ICD-10-CM

## 2022-07-13 DIAGNOSIS — L089 Local infection of the skin and subcutaneous tissue, unspecified: Secondary | ICD-10-CM | POA: Diagnosis not present

## 2022-07-13 DIAGNOSIS — E1165 Type 2 diabetes mellitus with hyperglycemia: Secondary | ICD-10-CM | POA: Diagnosis not present

## 2022-07-13 DIAGNOSIS — Z794 Long term (current) use of insulin: Secondary | ICD-10-CM | POA: Diagnosis not present

## 2022-07-25 ENCOUNTER — Other Ambulatory Visit: Payer: Self-pay | Admitting: Family Medicine

## 2022-07-25 DIAGNOSIS — L282 Other prurigo: Secondary | ICD-10-CM

## 2022-07-25 DIAGNOSIS — L309 Dermatitis, unspecified: Secondary | ICD-10-CM

## 2022-07-27 ENCOUNTER — Ambulatory Visit (INDEPENDENT_AMBULATORY_CARE_PROVIDER_SITE_OTHER): Payer: BC Managed Care – PPO | Admitting: Family Medicine

## 2022-07-27 ENCOUNTER — Encounter: Payer: Self-pay | Admitting: Family Medicine

## 2022-07-27 ENCOUNTER — Telehealth: Payer: Self-pay | Admitting: Family Medicine

## 2022-07-27 VITALS — BP 128/72 | HR 104 | Temp 99.0°F | Ht 65.0 in | Wt 277.2 lb

## 2022-07-27 DIAGNOSIS — E119 Type 2 diabetes mellitus without complications: Secondary | ICD-10-CM | POA: Diagnosis not present

## 2022-07-27 DIAGNOSIS — L02818 Cutaneous abscess of other sites: Secondary | ICD-10-CM | POA: Diagnosis not present

## 2022-07-27 MED ORDER — CEFTRIAXONE SODIUM 500 MG IJ SOLR
500.0000 mg | Freq: Once | INTRAMUSCULAR | Status: AC
Start: 2022-07-27 — End: 2022-07-27
  Administered 2022-07-27: 500 mg via INTRAMUSCULAR

## 2022-07-27 MED ORDER — CEFTRIAXONE SODIUM 1 G IJ SOLR
1.0000 g | Freq: Once | INTRAMUSCULAR | Status: DC
Start: 2022-07-27 — End: 2022-07-27

## 2022-07-27 MED ORDER — SULFAMETHOXAZOLE-TRIMETHOPRIM 800-160 MG PO TABS
1.0000 | ORAL_TABLET | Freq: Two times a day (BID) | ORAL | 0 refills | Status: DC
Start: 2022-07-27 — End: 2022-07-27

## 2022-07-27 MED ORDER — SULFAMETHOXAZOLE-TRIMETHOPRIM 800-160 MG PO TABS: 1.0000 | ORAL_TABLET | Freq: Two times a day (BID) | ORAL | 0 refills | Status: DC

## 2022-07-27 NOTE — Patient Instructions (Addendum)
-  Rocephin 1gm IM provided in office.  -START Bactrim DS 800-160, 1 tablet by mouth twice a day. (Every 12 hours) -Placed a stat referral to general surgeon. Your appointment is with [place] at 3:45 for 4 pm appointment. Telephone number is [phone].  -Continue with warm compresses, 4-6 times a day, 20 minutes at a time.  -If abscess becomes worst before general surgery appointment, please follow up with emergency department.

## 2022-07-27 NOTE — Progress Notes (Signed)
Acute Office Visit   Subjective:  Patient ID: Heather Myers, female    DOB: Jan 14, 1966, 57 y.o.   MRN: [id number]  Chief Complaint  Patient presents with   Mass    Pt reports she has raised area on RT side of her stomach. Pt has been seen at another practice for this and was given antibiotics for boil under breast 3 weeks ago. Breast area cleared up and stomach did not.    HPI Patient is a 57 year old African American female presenting with an abscess on right upper abd. She was seen on 03/18 at [facility]. Treated with Amoxicillin 875mg  PO BID for 5 days by Lalla Brothers, NP for skin infection of the left breast fold. She reports at this time, she had a small area on her right upper abd. She reports the area on her left breast fold healed, but  the small area on abd didn't improve. She reports it has got larger in size a dime to fifty cent piece, dark, and now is fluid filled. She reports she has used OTC Boil Ease and warm compresses. Denies drainage, skin is not open. Denies fever, but warm to touch. She has changed sleeping from her right side, thinking it was a bedsore. She reports she has a history of abscess with tunneling involved.   ROS See HPI above     Objective:   BP 128/72   Pulse (!) 104   Temp 99 F (37.2 C)   Ht 5\' 5"  (1.651 m)   Wt 277 lb 4 oz (125.8 kg)   SpO2 98%   BMI 46.14 kg/m    Physical Exam Vitals reviewed.  Constitutional:      General: She is not in acute distress.    Appearance: Normal appearance. She is obese. She is not ill-appearing, toxic-appearing or diaphoretic.  Eyes:     General:        Right eye: No discharge.        Left eye: No discharge.     Conjunctiva/sclera: Conjunctivae normal.  Cardiovascular:     Rate and Rhythm: Regular rhythm.     Heart sounds: Normal heart sounds. No murmur heard.    No friction rub. No gallop.  Pulmonary:     Effort: Pulmonary effort is normal.  No respiratory distress.     Breath sounds: Normal breath sounds.  Musculoskeletal:        General: Normal range of motion.  Skin:    General: Skin is warm and dry.     Findings: Abscess (Right upper abd-picture included.) present.     Comments: Tender to touch.  Neurological:     General: No focal deficit present.     Mental Status: She is alert and oriented to person, place, and time. Mental status is at baseline.  Psychiatric:        Mood and Affect: Mood normal.        Behavior: Behavior normal.        Thought Content: Thought content normal.        Judgment: Judgment normal.       Assessment & Plan:  Cutaneous abscess of other site -     Ambulatory referral to General Surgery -     cefTRIAXone Sodium -     cefTRIAXone Sodium  -Rocephin 1gm IM provided in office.  -START Bactrim DS 800-160, 1 tablet by mouth twice a day. (Every 12 hours) -Placed a stat referral  to general surgeon. Concerned with abscess could be tunneling with history of this and having diabetes. Your appointment is with [place] at 3:45 for 4 pm appointment on Wednesday. Telephone number is [phone].  -Continue with warm compresses, 4-6 times a day, 20 minutes at a time.  -If abscess becomes worst before general surgery appointment, please follow up with emergency department.  -Review supportive care and red flags that would prompt her to go to the emergency department.  -Patient expressed understanding and is in agreement with plan.   No follow-ups on file.  Valarie Merino, NP

## 2022-07-27 NOTE — Telephone Encounter (Signed)
I called and spoke with patient. She previously had a boil under her breast that her endocrinologist took a look at and prescribed 5 days of Amoxicillin for; this one cleared up. Now she has a boil on her stomach, filled with some type of fluid. It is hard on the edges and hot to the touch. Pt denies any fever aside from the boil being hot to touch. Patient has tried warm compresses but they have not helped. Patient is a diabetic, PCP is out of office today and there are no other openings with any providers here. Scheduled patient to see Valarie Merino, NP at [facility] to have the boil looked.  FYI to PCP & Di Kindle so she has an idea of what is going on.

## 2022-07-27 NOTE — Telephone Encounter (Signed)
Pt was sent to triage nurse and was told to go to UC or per pt to see md tomorrow. Pt is  dm and has sore on her stomach that  has  fluid in it. Please advise

## 2022-07-28 DIAGNOSIS — L02211 Cutaneous abscess of abdominal wall: Secondary | ICD-10-CM | POA: Diagnosis not present

## 2022-08-06 ENCOUNTER — Other Ambulatory Visit: Payer: Self-pay | Admitting: Family Medicine

## 2022-08-06 DIAGNOSIS — E1169 Type 2 diabetes mellitus with other specified complication: Secondary | ICD-10-CM

## 2022-09-06 ENCOUNTER — Other Ambulatory Visit: Payer: Self-pay | Admitting: Family Medicine

## 2022-09-06 DIAGNOSIS — L282 Other prurigo: Secondary | ICD-10-CM

## 2022-09-09 ENCOUNTER — Other Ambulatory Visit: Payer: Self-pay | Admitting: *Deleted

## 2022-09-09 MED ORDER — SPIRONOLACTONE 25 MG PO TABS: 25.0000 mg | ORAL_TABLET | Freq: Two times a day (BID) | ORAL | 1 refills | Status: DC

## 2022-09-26 ENCOUNTER — Other Ambulatory Visit: Payer: Self-pay | Admitting: Family Medicine

## 2022-09-26 DIAGNOSIS — L282 Other prurigo: Secondary | ICD-10-CM

## 2022-10-07 ENCOUNTER — Other Ambulatory Visit: Payer: Self-pay

## 2022-10-07 MED ORDER — FUROSEMIDE 20 MG PO TABS: 20.0000 mg | ORAL_TABLET | Freq: Every day | ORAL | 0 refills | Status: DC

## 2022-10-21 ENCOUNTER — Other Ambulatory Visit: Payer: Self-pay

## 2022-10-21 MED ORDER — SPIRONOLACTONE 25 MG PO TABS: 25.0000 mg | ORAL_TABLET | Freq: Two times a day (BID) | ORAL | 0 refills | Status: DC

## 2022-11-18 ENCOUNTER — Other Ambulatory Visit: Payer: Self-pay

## 2022-11-18 MED ORDER — METOPROLOL SUCCINATE ER 25 MG PO TB24: ORAL_TABLET | ORAL | 0 refills | Status: DC

## 2022-11-18 MED ORDER — METOPROLOL SUCCINATE ER 50 MG PO TB24: ORAL_TABLET | ORAL | 0 refills | Status: DC

## 2022-11-29 ENCOUNTER — Other Ambulatory Visit: Payer: Self-pay | Admitting: Family Medicine

## 2022-11-29 DIAGNOSIS — L282 Other prurigo: Secondary | ICD-10-CM

## 2022-12-30 ENCOUNTER — Telehealth: Payer: Self-pay | Admitting: Orthopaedic Surgery

## 2022-12-30 NOTE — Telephone Encounter (Signed)
Patient called asked if she can get set up for the gel injection for both knees?   Patient  also asked if she can get something called into her pharmacy for pain? Patient said her knees are in a lot of pain. The number to contact patient is [phone]

## 2022-12-31 ENCOUNTER — Other Ambulatory Visit: Payer: Self-pay | Admitting: Physician Assistant

## 2022-12-31 MED ORDER — MELOXICAM 7.5 MG PO TABS: 7.5000 mg | ORAL_TABLET | Freq: Every day | ORAL | 0 refills | Status: DC

## 2022-12-31 MED ORDER — TRAMADOL HCL 50 MG PO TABS: 50.0000 mg | ORAL_TABLET | Freq: Four times a day (QID) | ORAL | 0 refills | Status: DC | PRN

## 2022-12-31 NOTE — Telephone Encounter (Signed)
I called and talked to the pt. She stated tramadol and tylenol #3 makes her sleepy. She wanted to know if she can take anything else. I discussed the possibility of trying meloxicam or celebrex with tylenol. She would like to try this. OK to send in? If so what mg?

## 2022-12-31 NOTE — Telephone Encounter (Signed)
Sent to pharmacy, April can you please get auth for the gel inj?

## 2023-01-01 NOTE — Telephone Encounter (Signed)
VOB submitted for Orthovisc, bilateral knee 

## 2023-01-05 ENCOUNTER — Other Ambulatory Visit: Payer: Self-pay | Admitting: Family Medicine

## 2023-01-05 DIAGNOSIS — E1169 Type 2 diabetes mellitus with other specified complication: Secondary | ICD-10-CM

## 2023-01-05 DIAGNOSIS — F418 Other specified anxiety disorders: Secondary | ICD-10-CM

## 2023-01-14 ENCOUNTER — Other Ambulatory Visit: Payer: Self-pay | Admitting: Physician Assistant

## 2023-01-19 ENCOUNTER — Telehealth: Payer: Self-pay

## 2023-01-19 NOTE — Telephone Encounter (Signed)
Faxed completed PA form to [facility] at [phone] for Orthovisc, bilateral knee. PA pending

## 2023-01-21 ENCOUNTER — Other Ambulatory Visit: Payer: Self-pay | Admitting: Physician Assistant

## 2023-01-23 ENCOUNTER — Other Ambulatory Visit: Payer: Self-pay | Admitting: Physician Assistant

## 2023-01-28 ENCOUNTER — Other Ambulatory Visit: Payer: Self-pay | Admitting: Physician Assistant

## 2023-02-01 ENCOUNTER — Telehealth: Payer: Self-pay | Admitting: Internal Medicine

## 2023-02-01 MED ORDER — METOPROLOL SUCCINATE ER 25 MG PO TB24: ORAL_TABLET | ORAL | 0 refills | Status: DC

## 2023-02-01 MED ORDER — METOPROLOL SUCCINATE ER 50 MG PO TB24: ORAL_TABLET | ORAL | 0 refills | Status: DC

## 2023-02-01 MED ORDER — SPIRONOLACTONE 25 MG PO TABS: 25.0000 mg | ORAL_TABLET | Freq: Two times a day (BID) | ORAL | 0 refills | Status: DC

## 2023-02-01 NOTE — Telephone Encounter (Signed)
Pt's medications were sent to pt's pharmacy as requested. Confirmation received.  

## 2023-02-01 NOTE — Telephone Encounter (Signed)
*  STAT* If patient is at the pharmacy, call can be transferred to refill team.   1. Which medications need to be refilled? (please list name of each medication and dose if known)   metoprolol succinate (TOPROL-XL) 25 MG 24 hr tablet    metoprolol succinate (TOPROL-XL) 50 MG 24 hr tablet    spironolactone (ALDACTONE) 25 MG tablet   2. Which pharmacy/location (including street and city if local pharmacy) is medication to be sent to? [id number] - [place], [place] - [address] AT [address] [facility] [address]    3. Do they need a 30 day or 90 day supply? 30 day

## 2023-02-02 ENCOUNTER — Other Ambulatory Visit: Payer: Self-pay

## 2023-02-02 MED ORDER — FUROSEMIDE 20 MG PO TABS: 20.0000 mg | ORAL_TABLET | Freq: Every day | ORAL | 0 refills | Status: DC

## 2023-02-10 DIAGNOSIS — Z794 Long term (current) use of insulin: Secondary | ICD-10-CM | POA: Diagnosis not present

## 2023-02-10 DIAGNOSIS — Z23 Encounter for immunization: Secondary | ICD-10-CM | POA: Diagnosis not present

## 2023-02-10 DIAGNOSIS — E1169 Type 2 diabetes mellitus with other specified complication: Secondary | ICD-10-CM | POA: Diagnosis not present

## 2023-02-10 DIAGNOSIS — E1165 Type 2 diabetes mellitus with hyperglycemia: Secondary | ICD-10-CM | POA: Diagnosis not present

## 2023-02-23 ENCOUNTER — Other Ambulatory Visit: Payer: Self-pay | Admitting: Cardiology

## 2023-03-04 ENCOUNTER — Ambulatory Visit: Payer: BC Managed Care – PPO | Attending: Cardiology | Admitting: Cardiology

## 2023-03-05 ENCOUNTER — Encounter: Payer: Self-pay | Admitting: Cardiology

## 2023-03-09 ENCOUNTER — Other Ambulatory Visit: Payer: Self-pay | Admitting: Physician Assistant

## 2023-03-11 ENCOUNTER — Other Ambulatory Visit: Payer: Self-pay

## 2023-03-11 DIAGNOSIS — M1711 Unilateral primary osteoarthritis, right knee: Secondary | ICD-10-CM

## 2023-03-11 DIAGNOSIS — M1712 Unilateral primary osteoarthritis, left knee: Secondary | ICD-10-CM

## 2023-03-15 ENCOUNTER — Ambulatory Visit: Payer: BC Managed Care – PPO | Admitting: Physician Assistant

## 2023-03-17 ENCOUNTER — Other Ambulatory Visit: Payer: Self-pay | Admitting: Cardiology

## 2023-03-26 ENCOUNTER — Other Ambulatory Visit: Payer: Self-pay | Admitting: Cardiology

## 2023-04-01 ENCOUNTER — Other Ambulatory Visit: Payer: Self-pay | Admitting: Internal Medicine

## 2023-04-01 ENCOUNTER — Telehealth: Payer: Self-pay | Admitting: Physician Assistant

## 2023-04-01 NOTE — Telephone Encounter (Signed)
 *  STAT* If patient is at the pharmacy, call can be transferred to refill team.   1. Which medications need to be refilled? (please list name of each medication and dose if known)  furosemide (LASIX) 20 MG tablet  spironolactone (ALDACTONE) 25 MG tablet  metoprolol succinate (TOPROL-XL) 25 MG 24 hr tablet  metoprolol succinate (TOPROL-XL) 50 MG 24 hr tablet   2. Which pharmacy/location (including street and city if local pharmacy) is medication to be sent to?  [id number] - , [place] - [address] AT [address] [facility] [address]    3. Do they need a 30 day or 90 day supply?  Patient needs enough to get her to February appointment with [facility]

## 2023-04-05 MED ORDER — METOPROLOL SUCCINATE ER 50 MG PO TB24: ORAL_TABLET | ORAL | 0 refills | Status: DC

## 2023-04-05 MED ORDER — FUROSEMIDE 20 MG PO TABS: 20.0000 mg | ORAL_TABLET | Freq: Every day | ORAL | 0 refills | Status: DC

## 2023-04-05 MED ORDER — SPIRONOLACTONE 25 MG PO TABS: 25.0000 mg | ORAL_TABLET | Freq: Two times a day (BID) | ORAL | 0 refills | Status: DC

## 2023-04-05 MED ORDER — METOPROLOL SUCCINATE ER 25 MG PO TB24: ORAL_TABLET | ORAL | 0 refills | Status: DC

## 2023-04-08 ENCOUNTER — Other Ambulatory Visit: Payer: Self-pay | Admitting: Family Medicine

## 2023-04-08 DIAGNOSIS — F418 Other specified anxiety disorders: Secondary | ICD-10-CM

## 2023-04-11 ENCOUNTER — Other Ambulatory Visit: Payer: Self-pay | Admitting: Family Medicine

## 2023-04-11 DIAGNOSIS — E1169 Type 2 diabetes mellitus with other specified complication: Secondary | ICD-10-CM

## 2023-04-14 ENCOUNTER — Ambulatory Visit: Payer: BC Managed Care – PPO | Admitting: Family Medicine

## 2023-04-14 ENCOUNTER — Encounter: Payer: Self-pay | Admitting: Family Medicine

## 2023-04-14 VITALS — BP 122/70 | HR 88 | Temp 98.4°F | Resp 16 | Ht 65.0 in | Wt 276.0 lb

## 2023-04-14 DIAGNOSIS — R051 Acute cough: Secondary | ICD-10-CM

## 2023-04-14 DIAGNOSIS — J040 Acute laryngitis: Secondary | ICD-10-CM | POA: Diagnosis not present

## 2023-04-14 DIAGNOSIS — E785 Hyperlipidemia, unspecified: Secondary | ICD-10-CM

## 2023-04-14 DIAGNOSIS — F419 Anxiety disorder, unspecified: Secondary | ICD-10-CM

## 2023-04-14 DIAGNOSIS — E1169 Type 2 diabetes mellitus with other specified complication: Secondary | ICD-10-CM

## 2023-04-14 DIAGNOSIS — J069 Acute upper respiratory infection, unspecified: Secondary | ICD-10-CM

## 2023-04-14 MED ORDER — HYDROCOD POLI-CHLORPHE POLI ER 10-8 MG/5ML PO SUER: 5.0000 mL | Freq: Two times a day (BID) | ORAL | 0 refills | Status: AC | PRN

## 2023-04-14 MED ORDER — FLUTICASONE PROPIONATE 50 MCG/ACT NA SUSP: 1.0000 | Freq: Two times a day (BID) | NASAL | 1 refills | Status: AC

## 2023-04-14 MED ORDER — BENZONATATE 100 MG PO CAPS: 100.0000 mg | ORAL_CAPSULE | Freq: Two times a day (BID) | ORAL | 0 refills | Status: AC | PRN

## 2023-04-14 NOTE — Assessment & Plan Note (Addendum)
Last LDL 75 in 03/16/2018. Currently on Lipitor 40 mg daily. She is not fasting today. She has an appointment with cardiologist on 06/03/2023.

## 2023-04-14 NOTE — Patient Instructions (Addendum)
A few things to remember from today's visit:  URI, acute - Plan: fluticasone (FLONASE) 50 MCG/ACT nasal spray  Laryngitis, acute  Acute cough - Plan: chlorpheniramine-HYDROcodone (TUSSIONEX) 10-8 MG/5ML, benzonatate (TESSALON) 100 MG capsule  Anxiety disorder, unspecified type No changes in Celexa. Flonase at bedtime for 10-14 days. Tussionex at bedtime. Nasal irrigations and voice rest.  If you need refills for medications you take chronically, please call your pharmacy. Do not use My Chart to request refills or for acute issues that need immediate attention. If you send a my chart message, it may take a few days to be addressed, specially if I am not in the office.  Please be sure medication list is accurate. If a new problem present, please set up appointment sooner than planned today.

## 2023-04-14 NOTE — Progress Notes (Signed)
ACUTE VISIT Chief Complaint  Patient presents with   Nasal Congestion    Ongoing since Saturday, has lost her voice as well.    Fatigue   Cough   HPI: Heather Myers is a 57 y.o. female with a PMHx significant for HTN, asthma, HLD, DM II, allergic rhinitis, DJD, eczema, OA, anxiety, depression, and iron deficiency anemia, who is here today complaining of respiratory symptoms as described above.  Patient complains of fatigue, rhinorrhea, nasal congestion, and productive cough since 12/14.  She endorses associated sore throat, body aches, and nighttime wheezing.  Symptoms are interfering with her sleep.  She has not taken a Covid test.   She has noticed several sick coworkers at work recently.   She mentions she has seasonal allergies, but denies history of asthma. She has used inhalers in spurts before.  She has been taking alka-seltzer cold medicine, Nyquil, Mucinex, and Delsym Pertinent negatives include blood in her sputum, headache, stridor, or fever.   She was last seen on 04/10/2022 for acute visit. Last follow-up in 2019.  Anxiety:  She is still taking Celexa 40 mg daily. She thinks it helps and notices changes in mood if she skips a dose. Negative for depressed mood.  DM2: Since her last visit she been following with endocrinology regularly. Hyperlipidemia: Currently she is on atorvastatin 40 mg daily.  Lab Results  Component Value Date   CHOL 156 03/03/2018   HDL 43 03/03/2018   LDLCALC 75 03/03/2018   TRIG 188 (H) 03/03/2018   CHOLHDL 3.6 03/03/2018   Hypertension: Currently on metoprolol succinate 50 mg and 25 mg daily (75 mg) and spironolactone 25 mg daily. She follows with cardiologist.  02/10/23:  Contains abnormal data Comprehensive Metabolic Panel Specimen: Blood - Venous structure (body structure) Component Ref Range & Units 2 mo ago Comments  Sodium 136 - 145 mmol/L 140   Potassium 3.5 - 5.1 mmol/L 3.9 NO VISIBLE HEMOLYSIS   Chloride 98 - 107 mmol/L 104   CO2 21 - 31 mmol/L 26   Anion Gap 6 - 14 mmol/L 10   Glucose, Random 70 - 99 mg/dL 563 High    Blood Urea Nitrogen (BUN) 7 - 25 mg/dL 14   Creatinine 8.75 - 1.20 mg/dL 6.43   eGFR >32 RJ/JOA/4.16S0 >90    Review of Systems  Constitutional:  Positive for activity change, appetite change and fatigue. Negative for fever.  HENT:  Positive for congestion, postnasal drip, rhinorrhea and voice change.   Eyes:  Negative for discharge and redness.  Respiratory:  Negative for shortness of breath.   Cardiovascular:  Negative for chest pain, palpitations and leg swelling.  Gastrointestinal:  Negative for abdominal pain, nausea and vomiting.  Genitourinary:  Negative for decreased urine volume, dysuria and hematuria.  Allergic/Immunologic: Positive for environmental allergies.  Neurological:  Negative for syncope, weakness and headaches.  See other pertinent positives and negatives in HPI.  Current Outpatient Medications on File Prior to Visit  Medication Sig Dispense Refill   atorvastatin (LIPITOR) 40 MG tablet TAKE 1 TABLET BY MOUTH EVERY DAY 90 tablet 0   BAYER MICROLET LANCETS lancets Use as instructed to check sugar up to 6 times daily 600 each 5   Blood Glucose Monitoring Suppl (BAYER CONTOUR MONITOR) w/Device KIT Use to check sugar daily 1 each 0   citalopram (CELEXA) 40 MG tablet TAKE 1 TABLET BY MOUTH EVERY DAY 90 tablet 0   clobetasol cream (TEMOVATE) 0.05 % APPLY TO AFFECTED AREA TWICE A  DAY 45 g 1   Continuous Blood Gluc Sensor (DEXCOM G7 SENSOR) MISC Apply topically as directed.     dapagliflozin propanediol (FARXIGA) 10 MG TABS tablet Take 1 tablet (10 mg total) by mouth daily before breakfast.     Estradiol (YUVAFEM) 10 MCG TABS vaginal tablet Place one tablet (10 mcg) per vagina at bedtime twice a week. 8 tablet 0   famotidine (PEPCID) 20 MG tablet TAKE 1 TABLET BY MOUTH TWICE A DAY 60 tablet 0   furosemide (LASIX) 20 MG tablet Take 1 tablet  (20 mg total) by mouth daily. 90 tablet 0   glucose blood (BAYER CONTOUR TEST) test strip Use as instructed to check sugar 6 times daily 600 each 5   insulin aspart (NOVOLOG FLEXPEN) 100 UNIT/ML FlexPen INJECT 25-30 UNITS 3 TIMES A DAY *15 MINUTES BEFORE MEALS* 24 mL 1   Insulin Pen Needle 31G X 5 MM MISC Use 4x a day 400 each 3   meloxicam (MOBIC) 7.5 MG tablet TAKE 1 TABLET BY MOUTH EVERY DAY 30 tablet 0   metFORMIN (GLUCOPHAGE-XR) 500 MG 24 hr tablet TAKE 2 TABLETS BY MOUTH TWICE A DAY 360 tablet 0   metoprolol succinate (TOPROL-XL) 25 MG 24 hr tablet TAKE 1 TABLET BY MOUTH EVERY DAY WITH 50MG  TABLET FOR TOTAL OF 75MG  90 tablet 0   metoprolol succinate (TOPROL-XL) 50 MG 24 hr tablet Take 1 tablet by mouth daily with 25 mg tablet for total of 75 mg. Take with or immediately following a meal. 90 tablet 0   Multiple Vitamins-Minerals (MULTIVITAMIN WITH MINERALS) tablet Take 1 tablet by mouth daily.     spironolactone (ALDACTONE) 25 MG tablet Take 1 tablet (25 mg total) by mouth 2 (two) times daily. 180 tablet 0   tirzepatide (MOUNJARO) 7.5 MG/0.5ML Pen Inject 7.5 mg into the skin once a week.     TOUJEO SOLOSTAR 300 UNIT/ML SOPN INJECT 75 UNITS INTO THE SKIN DAILY 22.5 mL 2   traMADol (ULTRAM) 50 MG tablet Take 1 tablet (50 mg total) by mouth every 6 (six) hours as needed. 30 tablet 0   valACYclovir (VALTREX) 500 MG tablet Take one tablet daily, increase to one tablet po BID x 3 days prn outbreak 90 tablet 4   No current facility-administered medications on file prior to visit.    Past Medical History:  Diagnosis Date   Allergic rhinitis    Anemia    Anxiety    Arthritis    Asthma, mild intermittent 12/19/2015   uses inhaler as needed   Complication of anesthesia    difficulty waking up from anesthesia   Depression    Diabetes mellitus type 2 in obese    Echocardiogram shows left ventricular diastolic dysfunction    Fluid retention    Hyperlipidemia    on meds   Hypertension    on  meds   Morbid obesity (HCC)    Pneumonia    history of   Seasonal allergies    Sleep apnea    mild sleep, no CPAP needed at this time   Allergies  Allergen Reactions   Ramipril Shortness Of Breath    Social History   Socioeconomic History   Marital status: Legally Separated    Spouse name: Not on file   Number of children: Not on file   Years of education: Not on file   Highest education level: Not on file  Occupational History   Not on file  Tobacco Use   Smoking status:  Never   Smokeless tobacco: Never  Vaping Use   Vaping status: Never Used  Substance and Sexual Activity   Alcohol use: Not Currently   Drug use: No   Sexual activity: Not Currently    Partners: Male    Birth control/protection: I.U.D.  Other Topics Concern   Not on file  Social History Narrative   Not on file   Social Drivers of Health   Financial Resource Strain: Not on file  Food Insecurity: Low Risk  (02/10/2023)   Received from [facility]   Hunger Vital Sign    Worried About Running Out of Food in the Last Year: Never true    Ran Out of Food in the Last Year: Never true  Transportation Needs: No Transportation Needs (02/10/2023)   Received from [facility]    In the past 12 months, has lack of reliable transportation kept you from medical appointments, meetings, work or from getting things needed for daily living? : No  Physical Activity: Not on file  Stress: Not on file  Social Connections: Not on file    Vitals:   04/14/23 1403  BP: 122/70  Pulse: 88  Resp: 16  Temp: 98.4 F (36.9 C)  SpO2: 96%   Body mass index is 45.93 kg/m. Wt Readings from Last 3 Encounters:  04/14/23 276 lb (125.2 kg)  07/27/22 277 lb 4 oz (125.8 kg)  06/17/22 276 lb 2.1 oz (125.3 kg)    Physical Exam Vitals and nursing note reviewed.  Constitutional:      General: She is not in acute distress.    Appearance: She is well-developed. She is not ill-appearing.  HENT:      Head: Atraumatic.     Right Ear: Tympanic membrane, ear canal and external ear normal.     Left Ear: Tympanic membrane, ear canal and external ear normal.     Nose: Rhinorrhea present.     Right Turbinates: Enlarged.     Left Turbinates: Enlarged.     Mouth/Throat:     Mouth: Mucous membranes are moist.     Pharynx: Uvula midline. Posterior oropharyngeal erythema (Mild) and postnasal drip present. No oropharyngeal exudate or uvula swelling.     Tonsils: No tonsillar exudate.     Comments: Mild dysphonia. Eyes:     Conjunctiva/sclera: Conjunctivae normal.  Cardiovascular:     Rate and Rhythm: Normal rate and regular rhythm.     Heart sounds: No murmur heard. Pulmonary:     Effort: Pulmonary effort is normal. No respiratory distress.     Breath sounds: Normal breath sounds. No stridor. No wheezing.  Lymphadenopathy:     Head:     Right side of head: No submandibular adenopathy.     Left side of head: No submandibular adenopathy.     Cervical: No cervical adenopathy.  Skin:    General: Skin is warm.     Findings: No erythema or rash.  Neurological:     General: No focal deficit present.     Mental Status: She is alert and oriented to person, place, and time.  Psychiatric:        Mood and Affect: Mood and affect normal.    ASSESSMENT AND PLAN:  Ms. Wendall Mola was seen today for congestion, difficulty breathing, and voice changes.   URI, acute Symptoms suggests a viral etiology, so symptomatic treatment recommended. Adequate hydration. Flonase nasal spray daily at bedtime for 10 to 14 days and nasal saline irrigations to  help with nasal congestion. Instructed to monitor for signs of complications, including new onset of fever among some, clearly instructed about warning signs. F/U as needed.  -     Fluticasone Propionate; Place 1 spray into both nostrils 2 (two) times daily.  Dispense: 16 g; Refill: 1  Laryngitis, acute We discussed diagnosis and prognosis. Voice rest  recommended. Instructed about warning signs.  Acute cough Explained that cough and congestion can last a few more days and even weeks after acute symptoms have resolved. Lung auscultation negative today, she reports some wheezing but it seems to be caused by nasal congestion.  We discussed the option of a short course of prednisone, after discussion of some side effects she is not interested in taking this medication. I do not think imaging is needed at this time. Symptomatic treatment with Tussionex, especially at night so she can sleep, and benzonatate recommended. Adequate hydration. OTC Mucinex may also help.  -     Hydrocod Poli-Chlorphe Poli ER; Take 5 mLs by mouth every 12 (twelve) hours as needed for up to 10 days for cough.  Dispense: 80 mL; Refill: 0 -     Benzonatate; Take 1 capsule (100 mg total) by mouth 2 (two) times daily as needed for up to 10 days.  Dispense: 20 capsule; Refill: 0  Anxiety disorder, unspecified type Assessment & Plan: She has been on Celexa for about 10 years, she still feels like medication is helping and would like to continue. Continue Celexa 40 mg daily. As far as problem is stable annual follow-up can be continued.   Hyperlipidemia associated with type 2 diabetes mellitus (HCC) Assessment & Plan: Last LDL 75 in 03/16/2018. Currently on Lipitor 40 mg daily. She is not fasting today. She has an appointment with cardiologist on 06/03/2023.  Return in about 1 year (around 04/13/2024) for chronic problems.  I, Rolla Etienne Wierda, acting as a scribe for Terreon Ekholm [place], MD., have documented all relevant documentation on the behalf of Rosemary Pentecost [place], MD, as directed by  Parv Manthey [place], MD while in the presence of [name] Ardila [place], MD.   I, Manvir Prabhu [place], MD, have reviewed all documentation for this visit. The documentation on 04/14/23 for the exam, diagnosis, procedures, and orders are all accurate and complete.  Nishanth Mccaughan G. [place], MD  [facility]. [place] office.

## 2023-04-14 NOTE — Assessment & Plan Note (Signed)
She has been on Celexa for about 10 years, she still feels like medication is helping and would like to continue. Continue Celexa 40 mg daily. As far as problem is stable annual follow-up can be continued.

## 2023-04-19 ENCOUNTER — Telehealth: Payer: Self-pay

## 2023-04-19 NOTE — Telephone Encounter (Signed)
Copied from CRM [id number]. Topic: Clinical - Medical Advice >> Apr 19, 2023  3:48 PM Gaetano Hawthorne wrote: Reason for CRM: Patient was seen by Dr. [place] last Monday - she is still experiencing fatigue, nasal congestion, producing mucus and coughing. The cough syrup is helping her sleep, however, she still feels extremely fatigued. Patient was hoping that she would be feeling better by now but she is still not 100%.

## 2023-04-20 MED ORDER — DOXYCYCLINE HYCLATE 100 MG PO TABS
100.0000 mg | ORAL_TABLET | Freq: Two times a day (BID) | ORAL | 0 refills | Status: AC
Start: 2023-04-20 — End: 2023-04-27

## 2023-04-20 NOTE — Addendum Note (Signed)
Addended by: Weyman Croon E on: 04/20/2023 10:07 AM   Modules accepted: Orders

## 2023-04-20 NOTE — Telephone Encounter (Signed)
The cough and congestion may take weeks to completely resolve. She can continue symptomatic treatment as we discussed and monitor for fever. I do not think abx is going to help because it seems viral. A Rx for Doxycycline (100 mg bid x 7 d) can be sent for her to start if symptoms get worse but will not help in case of viral illness. Thanks, BJ

## 2023-04-20 NOTE — Telephone Encounter (Signed)
I called and spoke with patient. We went over the information below and she verbalized understanding. Rx sent in to pt's preferred pharmacy.

## 2023-05-12 ENCOUNTER — Ambulatory Visit: Payer: BC Managed Care – PPO | Admitting: Obstetrics and Gynecology

## 2023-05-13 ENCOUNTER — Ambulatory Visit (INDEPENDENT_AMBULATORY_CARE_PROVIDER_SITE_OTHER): Payer: BC Managed Care – PPO | Admitting: Obstetrics and Gynecology

## 2023-05-13 ENCOUNTER — Encounter: Payer: Self-pay | Admitting: Obstetrics and Gynecology

## 2023-05-13 VITALS — BP 114/76 | HR 80 | Temp 97.9°F

## 2023-05-13 DIAGNOSIS — Z1211 Encounter for screening for malignant neoplasm of colon: Secondary | ICD-10-CM

## 2023-05-13 DIAGNOSIS — Z30432 Encounter for removal of intrauterine contraceptive device: Secondary | ICD-10-CM

## 2023-05-13 DIAGNOSIS — R102 Pelvic and perineal pain: Secondary | ICD-10-CM | POA: Diagnosis not present

## 2023-05-13 DIAGNOSIS — E1165 Type 2 diabetes mellitus with hyperglycemia: Secondary | ICD-10-CM | POA: Diagnosis not present

## 2023-05-13 DIAGNOSIS — N926 Irregular menstruation, unspecified: Secondary | ICD-10-CM | POA: Diagnosis not present

## 2023-05-13 DIAGNOSIS — E785 Hyperlipidemia, unspecified: Secondary | ICD-10-CM | POA: Diagnosis not present

## 2023-05-13 DIAGNOSIS — I10 Essential (primary) hypertension: Secondary | ICD-10-CM | POA: Diagnosis not present

## 2023-05-13 DIAGNOSIS — R1031 Right lower quadrant pain: Secondary | ICD-10-CM

## 2023-05-13 LAB — URINALYSIS, COMPLETE W/RFL CULTURE
Bacteria, UA: NONE SEEN /[HPF]
Bilirubin Urine: NEGATIVE
Hgb urine dipstick: NEGATIVE
Hyaline Cast: NONE SEEN /[LPF]
Ketones, ur: NEGATIVE
Leukocyte Esterase: NEGATIVE
Nitrites, Initial: NEGATIVE
Protein, ur: NEGATIVE
RBC / HPF: NONE SEEN /[HPF] (ref 0–2)
Specific Gravity, Urine: 1.01 (ref 1.001–1.035)
WBC, UA: NONE SEEN /[HPF] (ref 0–5)
pH: 5.5 (ref 5.0–8.0)

## 2023-05-13 LAB — NO CULTURE INDICATED

## 2023-05-13 MED ORDER — CYCLOBENZAPRINE HCL 5 MG PO TABS: 5.0000 mg | ORAL_TABLET | Freq: Three times a day (TID) | ORAL | 0 refills | Status: DC | PRN

## 2023-05-13 NOTE — Progress Notes (Addendum)
Acute Office Visit  Subjective:    Patient ID: Heather Myers, female    DOB: 08-May-1965, 58 y.o.   MRN: [id number]   HPI 59 y.o. presents today for pelvic pain (Pelvic pain//jj/Pt c/o pelvic pain on rt side & it feels swollen. Occasional throbbing shooting pain) .mirena IUD placed with D&C hysteroscopy in 2018 She had it placed for menorrhagia soaking through tampon and 2pads at a time and on iv iron for the anemia. Patient with diabetes, arthritis and HTN.  Pain for 2 weeks and constant. Radiates down right leg.  Feels swollen at night. Worse with sitting up, standing or lying down. Denies any fevers, dysuria, n/v/d Is having memory issues since going through covid Patient thinks her ovary was removed  No LMP recorded. (Menstrual status: IUD).    Review of Systems     Objective:    OBGyn Exam  BP 114/76   Pulse 80   Temp 97.9 F (36.6 C) (Oral)   SpO2 99%  Wt Readings from Last 3 Encounters:  04/14/23 276 lb (125.2 kg)  07/27/22 277 lb 4 oz (125.8 kg)  06/17/22 276 lb 2.1 oz (125.3 kg)       Past Medical History:  Diagnosis Date   Allergic rhinitis    Anemia    Anxiety    Arthritis    Asthma, mild intermittent 12/19/2015   uses inhaler as needed   Complication of anesthesia    difficulty waking up from anesthesia   Depression    Diabetes mellitus type 2 in obese    Echocardiogram shows left ventricular diastolic dysfunction    Fluid retention    Hyperlipidemia    on meds   Hypertension    on meds   Morbid obesity (HCC)    Pneumonia    history of   Seasonal allergies    Sleep apnea    mild sleep, no CPAP needed at this time   Past Surgical History:  Procedure Laterality Date   ABDOMINAL SURGERY     chronic abd wall abscess   CESAREAN SECTION     x's 2   CHOLECYSTECTOMY     DILATATION & CURETTAGE/HYSTEROSCOPY WITH MYOSURE N/A 12/08/2016   Procedure: DILATATION & CURETTAGE/HYSTEROSCOPY WITH MYOSURE;  Surgeon: Romualdo Bolk, MD;   Location: [facility];  Service: Gynecology;  Laterality: N/A;   INTRAUTERINE DEVICE (IUD) INSERTION N/A 12/08/2016   Procedure: MIRENA INTRAUTERINE DEVICE (IUD) INSERTION (IUD FROM OFFICE);  Surgeon: Romualdo Bolk, MD;  Location: [facility];  Service: Gynecology;  Laterality: N/A;   KNEE ARTHROSCOPY Right    LIPOMA EXCISION  08/03/2011   Procedure: EXCISION LIPOMA;  Surgeon: Shelly Rubenstein, MD;  Location: [facility];  Service: General;  Laterality: Right;  wide excision chronic abscess Right lower abdominal wall   OOPHORECTOMY     R ovary [facility]     Abdomen: inguinal area tender to deep palpation. No obvious masses No rebound Prior ltcs scar and umbilical incision scarring noted.  Patient informed chaperone available to be present for breast and/or pelvic exam. Patient has requested no chaperone to be present. Patient has been advised what will be completed during breast and pelvic exam.   Assessment & Plan:  Pelvic pain, mirena IUD placed in 2018 To get TC of pelvis to rule out a hernia. Discussed other causes related to gyn such as fibroids, IUD, infection, ovarian, GU, muscular etc.  Also with history of multiple surgeries scar  tissue can cause source of pain and in differential.  Urine soley with glucose.  Encouraged good fsbs control. To remove IUD next visit with annual exam and PUS Referral for colonoscopy and mammogram placed as well.  Counseled on importance. Dr. Arman Filter Tresa Res

## 2023-05-14 LAB — COMPREHENSIVE METABOLIC PANEL
AG Ratio: 1.2 (calc) (ref 1.0–2.5)
ALT: 12 U/L (ref 6–29)
AST: 18 U/L (ref 10–35)
Albumin: 4 g/dL (ref 3.6–5.1)
Alkaline phosphatase (APISO): 115 U/L (ref 37–153)
BUN: 13 mg/dL (ref 7–25)
CO2: 24 mmol/L (ref 20–32)
Calcium: 9.9 mg/dL (ref 8.6–10.4)
Chloride: 103 mmol/L (ref 98–110)
Creat: 0.73 mg/dL (ref 0.50–1.03)
Globulin: 3.3 g/dL (ref 1.9–3.7)
Glucose, Bld: 156 mg/dL — ABNORMAL HIGH (ref 65–99)
Potassium: 4.2 mmol/L (ref 3.5–5.3)
Sodium: 139 mmol/L (ref 135–146)
Total Bilirubin: 0.8 mg/dL (ref 0.2–1.2)
Total Protein: 7.3 g/dL (ref 6.1–8.1)

## 2023-05-14 LAB — HEMOGLOBIN A1C
Hgb A1c MFr Bld: 8.9 %{Hb} — ABNORMAL HIGH (ref <5.7)
Mean Plasma Glucose: 209 mg/dL
eAG (mmol/L): 11.6 mmol/L

## 2023-05-14 LAB — CBC
HCT: 45 % (ref 35.0–45.0)
Hemoglobin: 14.9 g/dL (ref 11.7–15.5)
MCH: 30.7 pg (ref 27.0–33.0)
MCHC: 33.1 g/dL (ref 32.0–36.0)
MCV: 92.8 fL (ref 80.0–100.0)
MPV: 9.8 fL (ref 7.5–12.5)
Platelets: 360 10*3/uL (ref 140–400)
RBC: 4.85 10*6/uL (ref 3.80–5.10)
RDW: 11.9 % (ref 11.0–15.0)
WBC: 7.3 10*3/uL (ref 3.8–10.8)

## 2023-05-14 LAB — FOLLICLE STIMULATING HORMONE: FSH: 72.4 m[IU]/mL

## 2023-05-14 LAB — ESTRADIOL: Estradiol: 16 pg/mL

## 2023-05-19 ENCOUNTER — Telehealth: Payer: Self-pay

## 2023-05-19 ENCOUNTER — Other Ambulatory Visit: Payer: Self-pay

## 2023-05-19 DIAGNOSIS — M1712 Unilateral primary osteoarthritis, left knee: Secondary | ICD-10-CM

## 2023-05-19 DIAGNOSIS — M1711 Unilateral primary osteoarthritis, right knee: Secondary | ICD-10-CM

## 2023-05-19 NOTE — Telephone Encounter (Signed)
Patient is approved to have orthovisc, bilateral knee.   All information can be found in the referrals tab

## 2023-05-21 ENCOUNTER — Telehealth: Payer: Self-pay | Admitting: Radiology

## 2023-05-21 NOTE — Telephone Encounter (Signed)
Patient is approved to have orthovisc, bilateral knee.   All information can be found in the referrals tab  We have been attempting to contact this patient since September for her gel injections. We have attempted to call multiple times, no answer, & voicemail is always full. Attempts to contact via MyChart, have failed due to response time is several weeks past original message from patient.   We have approval for gel injections again, I have attempted to call patient again to schedule. I have called both numbers listed. Cellphone & Work. Cellphone - no answer, voicemail full. Work - I asked for patient and was told wrong number by whoever answered.   Is this patient someone you can attempt to contact? As we have had several problems with trying to contact and schedule.  Thank you.

## 2023-05-25 ENCOUNTER — Other Ambulatory Visit: Payer: BC Managed Care – PPO | Admitting: Obstetrics and Gynecology

## 2023-05-25 ENCOUNTER — Other Ambulatory Visit: Payer: BC Managed Care – PPO

## 2023-05-27 ENCOUNTER — Other Ambulatory Visit: Payer: BC Managed Care – PPO

## 2023-05-31 ENCOUNTER — Ambulatory Visit: Payer: BC Managed Care – PPO | Admitting: Physician Assistant

## 2023-05-31 ENCOUNTER — Telehealth: Payer: BC Managed Care – PPO | Admitting: Obstetrics and Gynecology

## 2023-06-03 ENCOUNTER — Ambulatory Visit: Payer: BC Managed Care – PPO | Admitting: Physician Assistant

## 2023-06-07 ENCOUNTER — Ambulatory Visit: Payer: BC Managed Care – PPO | Admitting: Physician Assistant

## 2023-06-07 ENCOUNTER — Telehealth: Payer: Self-pay | Admitting: Physician Assistant

## 2023-06-07 NOTE — Telephone Encounter (Signed)
 Pt called stating over the last week she was unable to stand and walk on her RT foot, pt has been using a cane ever since. Pt is also having mild swelling in ankle, soreness and pain in her heel. Pt would like a returned phone call for medical advice or have this matter addressed at her next appt.

## 2023-06-08 ENCOUNTER — Ambulatory Visit (INDEPENDENT_AMBULATORY_CARE_PROVIDER_SITE_OTHER): Payer: BC Managed Care – PPO

## 2023-06-08 DIAGNOSIS — R102 Pelvic and perineal pain: Secondary | ICD-10-CM | POA: Diagnosis not present

## 2023-06-09 ENCOUNTER — Telehealth (INDEPENDENT_AMBULATORY_CARE_PROVIDER_SITE_OTHER): Payer: BC Managed Care – PPO | Admitting: Obstetrics and Gynecology

## 2023-06-09 DIAGNOSIS — R102 Pelvic and perineal pain: Secondary | ICD-10-CM

## 2023-06-09 DIAGNOSIS — R1031 Right lower quadrant pain: Secondary | ICD-10-CM

## 2023-06-09 NOTE — Progress Notes (Signed)
I discussed the limitations, risks, security and privacy concerns of performing an evaluation and management service by telephone and the availability of in person appointments. I also discussed with the patient that there may be a patient responsible charge related to this service. The patient expressed understanding and agreed to proceed. Patient was at her home and I was present at [facility]. Visit conducted two way telephone call.  Patient with RLQ pain.  Comes and goes several times a day for a month.  Rated at a 8/10  FSH 72 PUS IUD in place, PACS [facility] Image Link   Show images for US PELVIC COMPLETE WITH TRANSVAGINAL Study Result  Narrative & Impression  Vaginal [place] Anteverted 6.80cm uterus Left ovary measuring 1.57cm Thin symmetrical endometrium 3.72mm No masses or thickening seen   RO surgically absent per patient RT adnexa- a 1.6x1.4cm simple cyst is seen No free fluid   From  58 y.o. presents today for pelvic pain (Pelvic pain//jj/Pt c/o pelvic pain on rt side & it feels swollen. Occasional throbbing shooting pain) .mirena IUD placed with D&C hysteroscopy in 2018 She had it placed for menorrhagia soaking through tampon and 2pads at a time and on iv iron for the anemia. Patient with diabetes, arthritis and HTN.  Pain for 2 weeks and constant. Radiates down right leg.  Feels swollen at night. Worse with sitting up, standing or lying down. Denies any fevers, dysuria, n/v/d Is having memory issues since going through covid Patient thinks her ovary was removed  Reports insomnia No energy She is considering HRT management  A/p pelvic pain To remove IUD to see if this is contributing to pain CT to rule out other causes of the pain Discussed considering HRT later after the pain source is found.  She agreed. To begin calcium and magnesium and vit D  10 minutes spent on reviewing records, imaging,  and one on one patient time and counseling patient  and documentation Dr. Karma Greaser

## 2023-06-10 ENCOUNTER — Encounter: Payer: Self-pay | Admitting: Physician Assistant

## 2023-06-10 ENCOUNTER — Ambulatory Visit: Payer: BC Managed Care – PPO | Admitting: Physician Assistant

## 2023-06-10 DIAGNOSIS — M1712 Unilateral primary osteoarthritis, left knee: Secondary | ICD-10-CM

## 2023-06-10 DIAGNOSIS — M722 Plantar fascial fibromatosis: Secondary | ICD-10-CM | POA: Diagnosis not present

## 2023-06-10 DIAGNOSIS — M170 Bilateral primary osteoarthritis of knee: Secondary | ICD-10-CM | POA: Diagnosis not present

## 2023-06-10 DIAGNOSIS — M1711 Unilateral primary osteoarthritis, right knee: Secondary | ICD-10-CM

## 2023-06-10 MED ORDER — HYALURONAN 30 MG/2ML IX SOSY
30.0000 mg | PREFILLED_SYRINGE | INTRA_ARTICULAR | Status: AC | PRN
  Administered 2023-06-10: 30 mg via INTRA_ARTICULAR

## 2023-06-10 NOTE — Progress Notes (Signed)
HPI: Heather Myers comes in today for scheduled Orthovisc injections bilateral knees.  However she states that her main complaint today is her right foot.  She reports that last week she was in [place] and stood up had severe pain in her foot such that she was unable to bear weight.  She has tried [employer] and massage gun which helps some.  She ranks her pain over the last 2 days to be 6 out of 10 pain.  Pain prior to this was 10 out of 10 at onset.  She had no injury. Bilateral knees scheduled for Orthovisc injections has known osteoarthritis both knees.  She has failed other treatments including NSAIDs and cortisone Co. steroid injections.  Currently right knee is more bothersome than the left.  Review of systems: See HPI otherwise negative  Physical exam: General Well-developed well-nourished female no acute distress mood and affect appropriate.  Psych alert and oriented x 3 Bilateral knees good range of motion no abnormal warmth erythema or effusion. Bilateral feet 5 out of 5 strength with inversion eversion against resistance.  Dorsiflexion plantarflexion intact bilateral ankles.  No tenderness over the posterior tibial tendon peroneal tendons bilaterally.  No tenderness over the Achilles bilateral..  Tenderness over the medial tubercle of the calcaneus right foot only.  Tight gastrocs bilaterally.  Dorsal pedal pulses are 2+ and equal and symmetric bilaterally.  Impression: Left knee osteoarthritis Right knee osteoarthritis Right foot plantar fasciitis  Plan: Discussed stretching with her for plantar fasciitis.  Also discussed shoewear.  Will send her to formal physical therapy for gastrocsoleus stretching modalities and home exercise program.  She tolerated the injections in both knees today.  She is advised to use over-the-counter NSAIDs and Tylenol tonight for knee pain.  Also ice the knee.  She will follow-up with [place] in 6 weeks if she still having pain despite these conservative  measures.    Procedure Note  Patient: Heather Myers             Date of Birth: 08-11-1965           MRN: [id number]             Visit Date: 06/10/2023  Procedures: Visit Diagnoses:  1. Plantar fasciitis of right foot   2. Primary osteoarthritis of left knee   3. Primary osteoarthritis of right knee     Large Joint Inj: bilateral knee on 06/10/2023 5:29 PM Indications: pain Details: 22 G 1.5 in needle, anterolateral approach  Arthrogram: No  Medications (Right): 30 mg Hyaluronan 30 MG/2ML Medications (Left): 30 mg Hyaluronan 30 MG/2ML Outcome: tolerated well, no immediate complications Procedure, treatment alternatives, risks and benefits explained, specific risks discussed. Consent was given by the patient. Immediately prior to procedure a time out was called to verify the correct patient, procedure, equipment, support staff and site/side marked as required. Patient was prepped and draped in the usual sterile fashion.

## 2023-06-11 ENCOUNTER — Ambulatory Visit: Payer: BC Managed Care – PPO | Admitting: Obstetrics and Gynecology

## 2023-06-11 NOTE — Progress Notes (Deleted)
 Cardiology Office Note:    Date:  06/11/2023   ID:  Heather Myers, DOB Jul 12, 1965, MRN [id number]  PCP:  [place], Betty G, MD   [facility] Providers Cardiologist:  Lesleigh Noe, MD (Inactive) { Click to update primary MD,subspecialty MD or APP then REFRESH:1}    Referring MD: [place], Betty G, MD   No chief complaint on file. ***  History of Present Illness:    Heather Myers is a 58 y.o. female with a hx of HTN, chronic DOE, DM2, morbid obesity, family hx of kidney disease with failure, and mild OSA not on CPAP who is being seen today for annual follow up.   [name] was previously followed by Dr. Katrinka Blazing for her care. Echocardiogram from 05/2016 showed normal LVEF at 55-60% with G1DD, and no valvular disease. At the time of last OV she was noted to be doing well with a >40lb weight loss due to Ozempic. Resting heart rates were above baseline despite being treated with Toprol 75mg  daily. Repeat echo was ordered however this was not performed.   Today    HTN:  DM2:   Morbid obesity: Lost approximately 40lbs on last OV due to Ozempic. BMI    Past Medical History:  Diagnosis Date   Allergic rhinitis    Anemia    Anxiety    Arthritis    Asthma, mild intermittent 12/19/2015   uses inhaler as needed   Complication of anesthesia    difficulty waking up from anesthesia   Depression    Diabetes mellitus type 2 in obese    Echocardiogram shows left ventricular diastolic dysfunction    Fluid retention    Hyperlipidemia    on meds   Hypertension    on meds   Morbid obesity (HCC)    Pneumonia    history of   Seasonal allergies    Sleep apnea    mild sleep, no CPAP needed at this time    Past Surgical History:  Procedure Laterality Date   ABDOMINAL SURGERY     chronic abd wall abscess   CESAREAN SECTION     x's 2   CHOLECYSTECTOMY     DILATATION & CURETTAGE/HYSTEROSCOPY WITH MYOSURE N/A 12/08/2016   Procedure: DILATATION &  CURETTAGE/HYSTEROSCOPY WITH MYOSURE;  Surgeon: Romualdo Bolk, MD;  Location: [facility];  Service: Gynecology;  Laterality: N/A;   INTRAUTERINE DEVICE (IUD) INSERTION N/A 12/08/2016   Procedure: MIRENA INTRAUTERINE DEVICE (IUD) INSERTION (IUD FROM OFFICE);  Surgeon: Romualdo Bolk, MD;  Location: [facility];  Service: Gynecology;  Laterality: N/A;   KNEE ARTHROSCOPY Right    LIPOMA EXCISION  08/03/2011   Procedure: EXCISION LIPOMA;  Surgeon: Shelly Rubenstein, MD;  Location: [facility];  Service: General;  Laterality: Right;  wide excision chronic abscess Right lower abdominal wall   OOPHORECTOMY     R ovary removed at [facility]      Current Medications: No outpatient medications have been marked as taking for the 06/14/23 encounter (Appointment) with Filbert Schilder, NP.     Allergies:   Ramipril   Social History   Socioeconomic History   Marital status: Divorced    Spouse name: Not on file   Number of children: Not on file   Years of education: Not on file   Highest education level: Not on file  Occupational History   Not on file  Tobacco Use   Smoking status: Never   Smokeless  tobacco: Never  Vaping Use   Vaping status: Never Used  Substance and Sexual Activity   Alcohol use: Not Currently   Drug use: No   Sexual activity: Yes    Partners: Male    Birth control/protection: I.U.D.    Comment: mirena inserted 01-26-17  Other Topics Concern   Not on file  Social History Narrative   Not on file   Social Drivers of Health   Financial Resource Strain: Not on file  Food Insecurity: Low Risk  (02/10/2023)   Received from [facility]   Hunger Vital Sign    Worried About Running Out of Food in the Last Year: Never true    Ran Out of Food in the Last Year: Never true  Transportation Needs: No Transportation Needs (02/10/2023)   Received from [facility]    In the past 12 months, has lack of reliable transportation kept you from  medical appointments, meetings, work or from getting things needed for daily living? : No  Physical Activity: Not on file  Stress: Not on file  Social Connections: Not on file     Family History: The patient's ***family history includes Cancer in her father; Diabetes in her mother and another family member; Hypertension in her father and mother; Other in her mother. There is no history of Colon polyps, Colon cancer, Esophageal cancer, Rectal cancer, or Stomach cancer.  ROS:   Please see the history of present illness.    *** All other systems reviewed and are negative.  EKGs/Labs/Other Studies Reviewed:    The following studies were reviewed today: ***      Recent Labs: 05/13/2023: ALT 12; BUN 13; Creat 0.73; Hemoglobin 14.9; Platelets 360; Potassium 4.2; Sodium 139  Recent Lipid Panel    Component Value Date/Time   CHOL 156 03/03/2018 1312   TRIG 188 (H) 03/03/2018 1312   HDL 43 03/03/2018 1312   CHOLHDL 3.6 03/03/2018 1312   CHOLHDL 6 06/15/2017 1444   VLDL 25.8 06/15/2017 1444   LDLCALC 75 03/03/2018 1312     Risk Assessment/Calculations:   {Does this patient have ATRIAL FIBRILLATION?:[phone]}  No BP recorded.  {Refresh Note OR Click here to enter BP  :1}***         Physical Exam:    VS:  There were no vitals taken for this visit.    Wt Readings from Last 3 Encounters:  04/14/23 276 lb (125.2 kg)  07/27/22 277 lb 4 oz (125.8 kg)  06/17/22 276 lb 2.1 oz (125.3 kg)     GEN: *** Well nourished, well developed in no acute distress HEENT: Normal NECK: No JVD; No carotid bruits LYMPHATICS: No lymphadenopathy CARDIAC: ***RRR, no murmurs, rubs, gallops RESPIRATORY:  Clear to auscultation without rales, wheezing or rhonchi  ABDOMEN: Soft, non-tender, non-distended MUSCULOSKELETAL:  No edema; No deformity  SKIN: Warm and dry NEUROLOGIC:  Alert and oriented x 3 PSYCHIATRIC:  Normal affect   ASSESSMENT:    No diagnosis found. PLAN:    In order of problems  listed above:  ***      {Are you ordering a CV Procedure (e.g. stress test, cath, DCCV, TEE, etc)?   Press F2        :[id number]}    Medication Adjustments/Labs and Tests Ordered: Current medicines are reviewed at length with the patient today.  Concerns regarding medicines are outlined above.  No orders of the defined types were placed in this encounter.  No orders of the defined types were  placed in this encounter.   There are no Patient Instructions on file for this visit.   Signed, Georgie Chard, NP  06/11/2023 3:50 PM    [facility]

## 2023-06-14 ENCOUNTER — Ambulatory Visit: Payer: BC Managed Care – PPO | Admitting: Cardiology

## 2023-07-02 ENCOUNTER — Other Ambulatory Visit: Payer: Self-pay | Admitting: Cardiology

## 2023-07-05 ENCOUNTER — Ambulatory Visit (HOSPITAL_BASED_OUTPATIENT_CLINIC_OR_DEPARTMENT_OTHER): Payer: BC Managed Care – PPO | Attending: Physician Assistant | Admitting: Physical Therapy

## 2023-07-05 DIAGNOSIS — H5213 Myopia, bilateral: Secondary | ICD-10-CM | POA: Diagnosis not present

## 2023-07-05 DIAGNOSIS — E119 Type 2 diabetes mellitus without complications: Secondary | ICD-10-CM | POA: Diagnosis not present

## 2023-07-05 LAB — HM DIABETES EYE EXAM

## 2023-07-10 ENCOUNTER — Other Ambulatory Visit: Payer: Self-pay | Admitting: Family Medicine

## 2023-07-10 ENCOUNTER — Other Ambulatory Visit: Payer: Self-pay | Admitting: Cardiology

## 2023-07-10 DIAGNOSIS — F418 Other specified anxiety disorders: Secondary | ICD-10-CM

## 2023-07-10 DIAGNOSIS — E1169 Type 2 diabetes mellitus with other specified complication: Secondary | ICD-10-CM

## 2023-07-13 ENCOUNTER — Ambulatory Visit: Payer: BC Managed Care – PPO | Admitting: Obstetrics and Gynecology

## 2023-07-27 ENCOUNTER — Other Ambulatory Visit: Payer: Self-pay | Admitting: Cardiology

## 2023-07-27 NOTE — Progress Notes (Deleted)
 Cardiology Office Note    Patient Name: Heather Myers Date of Encounter: 07/27/2023  Primary Care Provider:  [place], Betty G, MD Primary Cardiologist:  Lesleigh Noe, MD (Inactive) Primary Electrophysiologist: None   Past Medical History    Past Medical History:  Diagnosis Date   Allergic rhinitis    Anemia    Anxiety    Arthritis    Asthma, mild intermittent 12/19/2015   uses inhaler as needed   Complication of anesthesia    difficulty waking up from anesthesia   Depression    Diabetes mellitus type 2 in obese    Echocardiogram shows left ventricular diastolic dysfunction    Fluid retention    Hyperlipidemia    on meds   Hypertension    on meds   Morbid obesity (HCC)    Pneumonia    history of   Seasonal allergies    Sleep apnea    mild sleep, no CPAP needed at this time    History of Present Illness  Heather Myers is a 58 y.o. female with a PMH of HTN, HLD, DM type II, morbid obesity, mild OSA, IDA who presents today for follow-up.  She was seen initially by Dr. Graciela Husbands in 2012 for complaint of tachycardia and palpitations.  She also endorsed complaints of chest pain.  She underwent a nuclear stress test that showed no ischemia with probable motion artifact possibly EF abnormality.  She had a 2D echo completed 2018 that showed EF of 55 to 60% with grade 1 DD.  She has a history of fluid retention and was treated with Lasix which was decreased to 20 mg daily and spironolactone was added at 50 mg daily.  She was last seen by Dr. Katrinka Blazing on 11/13/2021 and reported doing well with no shortness of breath.  She had lost a total of 40 pounds with Ozempic and was ordered a 2D echo but was not completed.    Patient denies chest pain, palpitations, dyspnea, PND, orthopnea, nausea, vomiting, dizziness, syncope, edema, weight gain, or early satiety.   Discussed the use of AI scribe software for clinical note transcription with the patient, who gave verbal  consent to proceed.  History of Present Illness    ***Notes: -Last ischemic evaluation:  Review of Systems  Please see the history of present illness.    All other systems reviewed and are otherwise negative except as noted above.  Physical Exam    Wt Readings from Last 3 Encounters:  04/14/23 276 lb (125.2 kg)  07/27/22 277 lb 4 oz (125.8 kg)  06/17/22 276 lb 2.1 oz (125.3 kg)   ZO:XWRUE were no vitals filed for this visit.,There is no height or weight on file to calculate BMI. GEN: Well nourished, well developed in no acute distress Neck: No JVD; No carotid bruits Pulmonary: Clear to auscultation without rales, wheezing or rhonchi  Cardiovascular: Normal rate. Regular rhythm. Normal S1. Normal S2.   Murmurs: There is no murmur.  ABDOMEN: Soft, non-tender, non-distended EXTREMITIES:  No edema; No deformity   EKG/LABS/ Recent Cardiac Studies   ECG personally reviewed by me today - ***  Risk Assessment/Calculations:   {Does this patient have ATRIAL FIBRILLATION?[phone]}      Lab Results  Component Value Date   WBC 7.3 05/13/2023   HGB 14.9 05/13/2023   HCT 45.0 05/13/2023   MCV 92.8 05/13/2023   PLT 360 05/13/2023   Lab Results  Component Value Date   CREATININE 0.73 05/13/2023  BUN 13 05/13/2023   NA 139 05/13/2023   K 4.2 05/13/2023   CL 103 05/13/2023   CO2 24 05/13/2023   Lab Results  Component Value Date   CHOL 156 03/03/2018   HDL 43 03/03/2018   LDLCALC 75 03/03/2018   TRIG 188 (H) 03/03/2018   CHOLHDL 3.6 03/03/2018    Lab Results  Component Value Date   HGBA1C 8.9 (H) 05/13/2023   Assessment & Plan    1.  Essential hypertension  2.  Hyperlipidemia  3.  Diastolic dysfunction  4.  Morbid obesity  5.  DM type II      Disposition: Follow-up with Lesleigh Noe, MD (Inactive) or APP in *** months {Are you ordering a CV Procedure (e.g. stress test, cath, DCCV, TEE, etc)?   Press F2        :[id number]}   Signed, Napoleon Form,  Leodis Rains, NP 07/27/2023, 7:36 PM [facility]

## 2023-07-28 ENCOUNTER — Ambulatory Visit: Payer: BC Managed Care – PPO | Attending: Nurse Practitioner | Admitting: Nurse Practitioner

## 2023-07-28 DIAGNOSIS — E119 Type 2 diabetes mellitus without complications: Secondary | ICD-10-CM

## 2023-07-28 DIAGNOSIS — I5189 Other ill-defined heart diseases: Secondary | ICD-10-CM

## 2023-07-28 DIAGNOSIS — E785 Hyperlipidemia, unspecified: Secondary | ICD-10-CM

## 2023-07-28 DIAGNOSIS — I10 Essential (primary) hypertension: Secondary | ICD-10-CM

## 2023-07-29 ENCOUNTER — Encounter: Payer: Self-pay | Admitting: Nurse Practitioner

## 2023-08-05 ENCOUNTER — Other Ambulatory Visit: Payer: Self-pay | Admitting: Cardiology

## 2023-08-05 ENCOUNTER — Other Ambulatory Visit: Payer: Self-pay | Admitting: Physician Assistant

## 2023-08-05 NOTE — Telephone Encounter (Signed)
 Former Dr. Katrinka Blazing Pt. She has scheduled and then cancelled or noshown several appts. We have been refilling up until these appt dates. She hasn't seen a new Cardiologist yet. Does any Cardiologist want to refill? Please advise

## 2023-08-09 NOTE — Telephone Encounter (Signed)
Pt needs to be seen for further refills

## 2023-08-20 ENCOUNTER — Other Ambulatory Visit: Payer: Self-pay | Admitting: Cardiology

## 2023-08-23 NOTE — Telephone Encounter (Signed)
 Dr. Dorathy Gals Pt. She is overdue, hasn't been seen since 2023. She has cancelled or noshowed numerous appts. Does Dr. Renna Cary want to refill? Please advise.

## 2023-08-25 NOTE — Telephone Encounter (Signed)
 Please obtain from PCP - not seen here since 2023

## 2023-08-27 ENCOUNTER — Telehealth: Payer: Self-pay | Admitting: Internal Medicine

## 2023-08-27 ENCOUNTER — Other Ambulatory Visit: Payer: Self-pay | Admitting: Cardiology

## 2023-08-27 ENCOUNTER — Encounter (HOSPITAL_BASED_OUTPATIENT_CLINIC_OR_DEPARTMENT_OTHER): Payer: Self-pay | Admitting: Physician Assistant

## 2023-08-27 ENCOUNTER — Ambulatory Visit (HOSPITAL_BASED_OUTPATIENT_CLINIC_OR_DEPARTMENT_OTHER): Admitting: Physician Assistant

## 2023-08-27 ENCOUNTER — Other Ambulatory Visit: Payer: Self-pay | Admitting: Nurse Practitioner

## 2023-08-27 ENCOUNTER — Ambulatory Visit (HOSPITAL_BASED_OUTPATIENT_CLINIC_OR_DEPARTMENT_OTHER)

## 2023-08-27 DIAGNOSIS — M79645 Pain in left finger(s): Secondary | ICD-10-CM

## 2023-08-27 DIAGNOSIS — M1812 Unilateral primary osteoarthritis of first carpometacarpal joint, left hand: Secondary | ICD-10-CM | POA: Diagnosis not present

## 2023-08-27 MED ORDER — METOPROLOL SUCCINATE ER 50 MG PO TB24: ORAL_TABLET | ORAL | 0 refills | Status: DC

## 2023-08-27 MED ORDER — LIDOCAINE HCL 1 % IJ SOLN
0.5000 mL | INTRAMUSCULAR | Status: AC | PRN
  Administered 2023-08-27: .5 mL

## 2023-08-27 MED ORDER — FUROSEMIDE 20 MG PO TABS
20.0000 mg | ORAL_TABLET | Freq: Every day | ORAL | 0 refills | Status: DC
Start: 2023-08-27 — End: 2023-10-05

## 2023-08-27 MED ORDER — METOPROLOL SUCCINATE ER 25 MG PO TB24: ORAL_TABLET | ORAL | 0 refills | Status: DC

## 2023-08-27 MED ORDER — METHYLPREDNISOLONE ACETATE 40 MG/ML IJ SUSP
20.0000 mg | INTRAMUSCULAR | Status: AC | PRN
  Administered 2023-08-27: 20 mg

## 2023-08-27 NOTE — Telephone Encounter (Signed)
 RN spoke to patient. She is aware to have 90 day supply sent to the pharmacy- -. She is aware that a 30 day supply  has been sent to the pharmacy   She verbalized undersatnading

## 2023-08-27 NOTE — Telephone Encounter (Signed)
*  STAT* If patient is at the pharmacy, call can be transferred to refill team.   1. Which medications need to be refilled? (please list name of each medication and dose if known)   furosemide  (LASIX ) 20 MG tablet   metoprolol  succinate (TOPROL -XL) 25 MG 24 hr tablet    metoprolol  succinate (TOPROL -XL) 50 MG 24 hr tablet    2. Which pharmacy/location (including street and city if local pharmacy) is medication to be sent to?  [id number] - [place], [place] - [address] AT [address] [facility] [address]    3. Do they need a 30 day or 90 day supply? 90   Patient's has appt on 5/5

## 2023-08-27 NOTE — Progress Notes (Signed)
 Office Visit Note   Patient: Heather Myers           Date of Birth: 07/05/1965           MRN: [id number] Visit Date: 08/27/2023              Requested by: [place], Betty G, MD [address] [place],  [place] [address] PCP: [place], Betty G, MD   Assessment & Plan: Visit Diagnoses:  1. Thumb pain, left     Plan: Patient is a pleasant 58 year old woman who is a patient of [facility].  She comes in today with a 1 day history of locking and pain at the base of her left thumb.  She denies any injuries.  She recalls having her thumb injected in the past several years ago seem to help findings consistent with more than likely trigger thumb.  We will go forward injected today.  She will contact us  if she does not improve trigger finger injection was completed she did have relief following this.  Of note she is diabetic with a hemoglobin A1c of 8.9.  This was done a couple months ago she has now been on a continuous monitor we will only give her 20 mg steroid.  Follow-Up Instructions: Return if symptoms worsen or fail to improve.   Orders:  Orders Placed This Encounter  Procedures   DG Finger Thumb Left   No orders of the defined types were placed in this encounter.     Procedures: Hand/UE Inj: L thumb A1 for trigger finger on 08/27/2023 1:35 PM Indications: diagnostic and therapeutic Details: 25 G needle, volar approach Medications: 0.5 mL lidocaine  1 %; 20 mg methylPREDNISolone  acetate 40 MG/ML Outcome: tolerated well, no immediate complications Procedure, treatment alternatives, risks and benefits explained, specific risks discussed. Consent was given by the patient.       Clinical Data: No additional findings.   Subjective: Chief Complaint  Patient presents with   Left Thumb - Pain    HPI Patient is a pleasant 58 year old woman right-hand-dominant comes in today with left thumb pain.  She said yesterday her thumb locked locked up while she was putting on  piece clothing she says it hurts at the base with motion she has tried ice she think she maybe had an issue several years ago and had an injection she does have a history of diabetes  Review of Systems  All other systems reviewed and are negative.    Objective: Vital Signs: There were no vitals taken for this visit.  Physical Exam Constitutional:      Appearance: Normal appearance.  Pulmonary:     Effort: Pulmonary effort is normal.  Neurological:     General: No focal deficit present.     Mental Status: She is alert and oriented to person, place, and time.  Psychiatric:        Mood and Affect: Mood normal.        Behavior: Behavior normal.     Ortho Exam Examination of her left thumb she has a strong radial pulse she has brisk capillary refill she does have some triggering with flexion extension.  She is extremely tender palpation over the A1 pulley Specialty Comments:  No specialty comments available.  Imaging: No results found.   PMFS History: Patient Active Problem List   Diagnosis Date Noted   Morbid obesity with body mass index (BMI) of 45.0 to 49.9 in adult [facility]) 07/13/2022   Uncontrolled type 2 diabetes mellitus  with hyperglycemia (HCC) 06/10/2018   Eczema 08/11/2017   Unilateral primary osteoarthritis, left knee 11/05/2016   Insulin  dependent type 2 diabetes mellitus, uncontrolled 12/19/2015   Asthma, mild intermittent 12/19/2015   Bilateral lower extremity edema 12/19/2015   DJD (degenerative joint disease) of knee 06/29/2013   Dysmenorrhea 03/30/2013   Dyspnea 06/11/2010   Morbid obesity (HCC) 12/21/2008   Palpitations 12/21/2008   GENITAL HERPES 03/22/2007   Hyperlipidemia associated with type 2 diabetes mellitus (HCC) 03/22/2007   Iron deficiency anemia 03/22/2007   Anxiety disorder 03/22/2007   DEPRESSION 03/22/2007   Essential hypertension 03/22/2007   ALLERGIC RHINITIS 03/22/2007   DISC DISEASE, CERVICAL 03/22/2007   POSITIVE PPD 03/22/2007    Past Medical History:  Diagnosis Date   Allergic rhinitis    Anemia    Anxiety    Arthritis    Asthma, mild intermittent 12/19/2015   uses inhaler as needed   Complication of anesthesia    difficulty waking up from anesthesia   Depression    Diabetes mellitus type 2 in obese    Echocardiogram shows left ventricular diastolic dysfunction    Fluid retention    Hyperlipidemia    on meds   Hypertension    on meds   Morbid obesity (HCC)    Pneumonia    history of   Seasonal allergies    Sleep apnea    mild sleep, no CPAP needed at this time    Family History  Problem Relation Age of Onset   Diabetes Mother    Hypertension Mother    Other Mother        renal failure   Hypertension Father    Cancer Father    Diabetes Other    Colon polyps Neg Hx    Colon cancer Neg Hx    Esophageal cancer Neg Hx    Rectal cancer Neg Hx    Stomach cancer Neg Hx     Past Surgical History:  Procedure Laterality Date   ABDOMINAL SURGERY     chronic abd wall abscess   CESAREAN SECTION     x's 2   CHOLECYSTECTOMY     DILATATION & CURETTAGE/HYSTEROSCOPY WITH MYOSURE N/A 12/08/2016   Procedure: DILATATION & CURETTAGE/HYSTEROSCOPY WITH MYOSURE;  Surgeon: Wanita Gutta, MD;  Location: [facility];  Service: Gynecology;  Laterality: N/A;   INTRAUTERINE DEVICE (IUD) INSERTION N/A 12/08/2016   Procedure: MIRENA  INTRAUTERINE DEVICE (IUD) INSERTION (IUD FROM OFFICE);  Surgeon: Jertson, Jill Evelyn, MD;  Location: [facility];  Service: Gynecology;  Laterality: N/A;   KNEE ARTHROSCOPY Right    LIPOMA EXCISION  08/03/2011   Procedure: EXCISION LIPOMA;  Surgeon: Rogena Class, MD;  Location: [facility];  Service: General;  Laterality: Right;  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    Comment: mirena  inserted 01-26-17

## 2023-08-30 ENCOUNTER — Ambulatory Visit: Attending: Internal Medicine | Admitting: Internal Medicine

## 2023-08-30 NOTE — Progress Notes (Deleted)
 Cardiology Office Note:   Date:  08/30/2023  ID:  Heather Myers, DOB [birth year], MRN [id number] PCP:  [place], Betty G, MD  [facility] Providers Cardiologist:  Alyssa Backbone, MD Referring MD: [place], Betty G, MD  Chief Complaint/Reason for Referral: Follow-up for hypertension ASSESSMENT:    1. Type 2 diabetes mellitus without complication, with long-term current use of insulin  (HCC)   2. Hyperlipidemia associated with type 2 diabetes mellitus (HCC)   3. Hypertension associated with diabetes (HCC)   4. Diastolic dysfunction   5. BMI 40.0-44.9, adult (HCC)     PLAN:   In order of problems listed above: T2DM: Start aspirin 81 mg, continue Farxiga  10 mg, atorvastatin  40 mg.  Start losartan 12.5 mg daily.*** Hyperlipidemia: Continue atorvastatin  40 mg; check lipid panel, LFTs, LP(a) today.*** Hypertension: Continue spironolactone  25 mg and Toprol  75 mg daily; start losartan 12.5 mg at bedtime.*** Diastolic dysfunction: Continue spironolactone  25 mg, Toprol  75 mg, Farxiga  10 mg and Lasix  20 mg.  Losartan?*** Elevated BMI: Continue Mounjaro 10 mg/week.         {Are you ordering a CV Procedure (e.g. stress test, cath, DCCV, TEE, etc)?   Press F2        :[id number]}   Dispo:  No follow-ups on file.      Medication Adjustments/Labs and Tests Ordered: Current medicines are reviewed at length with the patient today.  Concerns regarding medicines are outlined above.  The following changes have been made:  {PLAN; NO CHANGE:13088:s}   Labs/tests ordered: No orders of the defined types were placed in this encounter.   Medication Changes: No orders of the defined types were placed in this encounter.   Current medicines are reviewed at length with the patient today.  The patient {ACTIONS; HAS/DOES NOT HAVE:19233} concerns regarding medicines.  I spent *** minutes reviewing all clinical data during and prior to this visit including all relevant imaging studies, laboratories,  clinical information from other health systems and prior notes from both Cardiology and other specialties, interviewing the patient, conducting a complete physical examination, and coordinating care in order to formulate a comprehensive and personalized evaluation and treatment plan.   History of Present Illness:      FOCUSED PROBLEM LIST:   Hypertension Intolerant of ACE inhibitors T2DM On insulin  Hyperlipidemia Diastolic dysfunction G1 DD, no significant valve issues, EF 55 to 60% TTE 2018 BMI 44  May 2025:  Patient consents to use of AI scribe.*** The patient is a 58 year old female with above listed medical problems here for routine cardiology follow-up.  She was last seen in 2023.  At that point in time her blood pressure was well-controlled.  No changes were made to her medical regimen.          Current Medications: No outpatient medications have been marked as taking for the 08/30/23 encounter (Appointment) with Heather Rutten K, MD.     Review of Systems:   Please see the history of present illness.    All other systems reviewed and are negative.     EKGs/Labs/Other Test Reviewed:   EKG: 2023 sinus tachycardia left atrial enlargement  EKG Interpretation Date/Time:    Ventricular Rate:    PR Interval:    QRS Duration:    QT Interval:    QTC Calculation:   R Axis:      Text Interpretation:           Risk Assessment/Calculations:   {Does this patient have ATRIAL FIBRILLATION?:[phone]}  Physical Exam:   VS:  There were no vitals taken for this visit.   No BP recorded.  {Refresh Note OR Click here to enter BP  :1}***   Wt Readings from Last 3 Encounters:  04/14/23 276 lb (125.2 kg)  07/27/22 277 lb 4 oz (125.8 kg)  06/17/22 276 lb 2.1 oz (125.3 kg)      GENERAL:  No apparent distress, AOx3 HEENT:  No carotid bruits, +2 carotid impulses, no scleral icterus CAR: RRR Irregular RR*** no murmurs***, gallops, rubs, or thrills RES:  Clear to  auscultation bilaterally ABD:  Soft, nontender, nondistended, positive bowel sounds x 4 VASC:  +2 radial pulses, +2 carotid pulses NEURO:  CN 2-12 grossly intact; motor and sensory grossly intact PSYCH:  No active depression or anxiety EXT:  No edema, ecchymosis, or cyanosis  Signed, Heather Myers Myers Heather Wauters, MD  08/30/2023 8:07 AM    [facility] [address] [place], [place], [place]  [address] Phone: [phone]; Fax: [phone]   Note:  This document was prepared using Dragon voice recognition software and may include unintentional dictation errors.

## 2023-08-31 ENCOUNTER — Encounter: Payer: Self-pay | Admitting: Internal Medicine

## 2023-09-22 ENCOUNTER — Other Ambulatory Visit: Payer: Self-pay | Admitting: Internal Medicine

## 2023-09-27 ENCOUNTER — Telehealth: Payer: Self-pay | Admitting: Internal Medicine

## 2023-09-27 ENCOUNTER — Other Ambulatory Visit: Payer: Self-pay | Admitting: Nurse Practitioner

## 2023-09-27 MED ORDER — METOPROLOL SUCCINATE ER 50 MG PO TB24: ORAL_TABLET | ORAL | 1 refills | Status: DC

## 2023-09-27 MED ORDER — METOPROLOL SUCCINATE ER 25 MG PO TB24: ORAL_TABLET | ORAL | 1 refills | Status: DC

## 2023-09-27 NOTE — Telephone Encounter (Signed)
*  STAT* If patient is at the pharmacy, call can be transferred to refill team.   1. Which medications need to be refilled? (please list name of each medication and dose if known)   metoprolol  succinate (TOPROL -XL) 50 MG 24 hr tablet   2. Would you like to learn more about the convenience, safety, & potential cost savings by using the [facility]?   3. Are you open to using the [facility] (Type [facility]. ).  4. Which pharmacy/location (including street and city if local pharmacy) is medication to be sent to?  [id number] - [place], [place] - [address] [facility] [address]    5. Do they need a 30 day or 90 day supply?   Patient stated she is completely out of this medication.

## 2023-09-27 NOTE — Telephone Encounter (Signed)
 Additional - Patient has appointment scheduled on 7/9 with K. Johnson.

## 2023-09-27 NOTE — Telephone Encounter (Signed)
 Pt's medications were sent to pt's pharmacy as requested. Confirmation received.

## 2023-10-03 ENCOUNTER — Other Ambulatory Visit: Payer: Self-pay | Admitting: Internal Medicine

## 2023-10-09 ENCOUNTER — Other Ambulatory Visit: Payer: Self-pay | Admitting: Cardiology

## 2023-10-19 ENCOUNTER — Ambulatory Visit: Admitting: Physician Assistant

## 2023-10-19 ENCOUNTER — Encounter: Payer: Self-pay | Admitting: Physician Assistant

## 2023-10-19 ENCOUNTER — Other Ambulatory Visit (INDEPENDENT_AMBULATORY_CARE_PROVIDER_SITE_OTHER): Payer: Self-pay

## 2023-10-19 DIAGNOSIS — M79645 Pain in left finger(s): Secondary | ICD-10-CM | POA: Diagnosis not present

## 2023-10-19 DIAGNOSIS — M25571 Pain in right ankle and joints of right foot: Secondary | ICD-10-CM

## 2023-10-19 NOTE — Addendum Note (Signed)
 Addended by: RODGERS LACY on: 10/19/2023 03:33 PM   Modules accepted: Orders

## 2023-10-19 NOTE — Progress Notes (Signed)
 Office Visit Note   Patient: Heather Myers           Date of Birth: February 01, 1966           MRN: [id number] Visit Date: 10/19/2023              Requested by: [place], Betty G, MD [address] [place],  [place] [address] PCP: [place], Betty G, MD   Assessment & Plan: Visit Diagnoses:  1. Pain in right ankle and joints of right foot     Plan: Patient is a pleasant 58 year old woman who I injected for trigger thumb about not quite 2 months ago.  She does say that she got some relief but it is returned.  She has had this going on for several years now and has had an injection prior to mine.  With regards to this I have talked about referring her for possible surgical consultation with Dr. Walt and she like to do this. Secondarily she comes in today for ankle pain she sustained with an eversion injury which was very slight but from her explanation did cause her to have pain on the medial side of her ankle.  Her x-rays look good.  She does have some bruising and tenderness over the deltoid.  She has a history of plantar fasciitis but does not act like a plantar fascia rupture.  Will put her in a boot she will use one of her arch supports.  Will reevaluate her in 2 weeks if she has continued problems could consider therapy or an MRI  Follow-Up Instructions: Return in about 2 weeks (around 11/02/2023).   Orders:  Orders Placed This Encounter  Procedures   XR Ankle Complete Right   No orders of the defined types were placed in this encounter.     Procedures: No procedures performed   Clinical Data: No additional findings.   Subjective: No chief complaint on file.   HPI patient is a 58 year old woman who comes in today with a chief complaint of medial right ankle pain this occurred about 56 days ago when she had an inversion type injury when she was cleaning her floor.  Denies feeling any pop.  But had progressive swelling.  Pain is on the medial side of her ankle has a  history of plantar fasciitis.  Also coming in for recurrent trigger thumb.  She did get an injection and has had injection in the past and has recurrence of painful symptoms.  Review of Systems  All other systems reviewed and are negative.    Objective: Vital Signs: There were no vitals taken for this visit.  Physical Exam Constitutional:      Appearance: Normal appearance.   Skin:    General: Skin is warm and dry.   Neurological:     General: No focal deficit present.     Mental Status: She is alert and oriented to person, place, and time.   Psychiatric:        Mood and Affect: Mood normal.        Behavior: Behavior normal.     Ortho Exam Examination of her right ankle she has some bruising on the medial side of her ankle she has good dorsiflexion plantarflexion inversion and eversion.  She is focally tender over the deltoid ligament.  No tenderness over the bottom of her foot or over the plantar fascia insertion.  Compartments are soft and compressible negative Homans' sign no lateral sided tenderness Specialty Comments:  No specialty  comments available.  Imaging: XR Ankle Complete Right Result Date: 10/19/2023 Radiographs of her ankle do not demonstrate any fractures well-maintained alignment    PMFS History: Patient Active Problem List   Diagnosis Date Noted   Morbid obesity with body mass index (BMI) of 45.0 to 49.9 in adult [facility]) 07/13/2022   Uncontrolled type 2 diabetes mellitus with hyperglycemia (HCC) 06/10/2018   Eczema 08/11/2017   Unilateral primary osteoarthritis, left knee 11/05/2016   Insulin  dependent type 2 diabetes mellitus, uncontrolled 12/19/2015   Asthma, mild intermittent 12/19/2015   Bilateral lower extremity edema 12/19/2015   DJD (degenerative joint disease) of knee 06/29/2013   Dysmenorrhea 03/30/2013   Dyspnea 06/11/2010   Morbid obesity (HCC) 12/21/2008   Palpitations 12/21/2008   GENITAL HERPES 03/22/2007   Hyperlipidemia associated  with type 2 diabetes mellitus (HCC) 03/22/2007   Iron deficiency anemia 03/22/2007   Anxiety disorder 03/22/2007   DEPRESSION 03/22/2007   Essential hypertension 03/22/2007   ALLERGIC RHINITIS 03/22/2007   DISC DISEASE, CERVICAL 03/22/2007   POSITIVE PPD 03/22/2007   Past Medical History:  Diagnosis Date   Allergic rhinitis    Anemia    Anxiety    Arthritis    Asthma, mild intermittent 12/19/2015   uses inhaler as needed   Complication of anesthesia    difficulty waking up from anesthesia   Depression    Diabetes mellitus type 2 in obese    Echocardiogram shows left ventricular diastolic dysfunction    Fluid retention    Hyperlipidemia    on meds   Hypertension    on meds   Morbid obesity (HCC)    Pneumonia    history of   Seasonal allergies    Sleep apnea    mild sleep, no CPAP needed at this time    Family History  Problem Relation Age of Onset   Diabetes Mother    Hypertension Mother    Other Mother        renal failure   Hypertension Father    Cancer Father    Diabetes Other    Colon polyps Neg Hx    Colon cancer Neg Hx    Esophageal cancer Neg Hx    Rectal cancer Neg Hx    Stomach cancer Neg Hx     Past Surgical History:  Procedure Laterality Date   ABDOMINAL SURGERY     chronic abd wall abscess   CESAREAN SECTION     x's 2   CHOLECYSTECTOMY     DILATATION & CURETTAGE/HYSTEROSCOPY WITH MYOSURE N/A 12/08/2016   Procedure: DILATATION & CURETTAGE/HYSTEROSCOPY WITH MYOSURE;  Surgeon: Jannis Kate Norris, MD;  Location: [facility];  Service: Gynecology;  Laterality: N/A;   INTRAUTERINE DEVICE (IUD) INSERTION N/A 12/08/2016   Procedure: MIRENA  INTRAUTERINE DEVICE (IUD) INSERTION (IUD FROM OFFICE);  Surgeon: Jertson, Jill Evelyn, MD;  Location: [facility];  Service: Gynecology;  Laterality: N/A;   KNEE ARTHROSCOPY Right    LIPOMA EXCISION  08/03/2011   Procedure: EXCISION LIPOMA;  Surgeon: Vicenta DELENA Poli, MD;  Location: [facility];  Service:  General;  Laterality: Right;  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    Comment: mirena  inserted 01-26-17

## 2023-10-23 NOTE — Progress Notes (Signed)
 Cardiology Office Note   Date:  11/03/2023  ID:  Heather Myers, DOB 13-Mar-1966, MRN [id number] PCP: [place], Betty G, MD  [facility] Providers Cardiologist:  Victory LELON Claudene DOUGLAS, MD (Inactive)    History of Present Illness Heather Myers is a 58 y.o. female with a past medical history of HTN, type 2 DM, morbid obesity, mild OSA. Patient previously followed by Dr. Claudene and presents today for an overdue annual follow up appointment   Patient previously had echocardiogram in 2018 that showed EF 55-60%, no regional wall motion abnormalities, grade I DD. She was last seen by Dr. Claudene in 10/2021. At that time, she was doing well. Had lost 40 lbs on ozempic. Remained on metoprolol , farxiga , lipitor, lasix , aldactone .   Today patient, patient presents to reestablish care after last being seen in 2023.  Patient reports that she has overall been doing well from a cardiac standpoint.  Denies chest pain, shortness of breath, palpitations, dizziness, syncope, near syncope.  Has occasional lower extremity swelling but this is well-controlled with her medications.  She did hurt her ankle a few weeks ago and is currently in a brace, has had some swelling with this injury.  She does notice that she has been more fatigued than usual.  Was previously told she had mild sleep apnea but was not started on CPAP about 10 years ago.  Has not had a sleep study since then. BP has been well controlled. Her PCP checks her labs.   Studies Reviewed Cardiac Studies & Procedures   ______________________________________________________________________________________________     ECHOCARDIOGRAM  ECHOCARDIOGRAM COMPLETE 06/22/2016  Narrative *[facility]* [address] [place], [place] [address] [phone]  ------------------------------------------------------------------- Transthoracic Echocardiography  Patient:    [place], [place] MR #:       [id number] Study Date:  06/22/2016 Gender:     F Age:        50 Height:     162.6 cm Weight:     134.1 kg BSA:        2.54 m^2 Pt. Status: Room:  ATTENDING    Victory MICAEL Claudene, MD TISA     Victory MICAEL Claudene, MD REFERRING    Victory MICAEL Claudene, MD SONOGRAPHER  Shanda Boers, RDCS PERFORMING   [facility], Outpatient  cc:  ------------------------------------------------------------------- LV EF: 55% -   60%  ------------------------------------------------------------------- Indications:      (R06.00).  ------------------------------------------------------------------- History:   PMH:  OSA. Acquired from the patient and from the patient&'s chart.  Dyspnea, exertional dyspnea, and bilateral lower extremity edema.  Risk factors:  Hypertension. Diabetes mellitus. Morbidly obese. Dyslipidemia.  ------------------------------------------------------------------- Study Conclusions  - Left ventricle: The cavity size was normal. Systolic function was normal. The estimated ejection fraction was in the range of 55% to 60%. Wall motion was normal; there were no regional wall motion abnormalities. Doppler parameters are consistent with abnormal left ventricular relaxation (grade 1 diastolic dysfunction). Acoustic contrast opacification revealed no evidence ofthrombus.  ------------------------------------------------------------------- Labs, prior tests, procedures, and surgery: Echocardiography (2012).     EF was 70%.  ------------------------------------------------------------------- Study data:  The previous study was not available, so comparison was made to the report of 2012.  Study status:  Routine. Procedure:  The patient reported no pain pre or post test. Transthoracic echocardiography for diagnosis, for left ventricular function evaluation, for right ventricular function evaluation, and for assessment of valvular function. Image quality was adequate. The study was technically difficult, as a result of  poor sound wave  transmission and body habitus. Intravenous contrast (Definity ) was administered to opacify the LV.  Study completion:  There were no complications.          Transthoracic echocardiography.  M-mode, complete 2D, spectral Doppler, and color Doppler.  Birthdate: Patient birthdate: 10-09-1965.  Age:  Patient is 58 yr old.  Sex: Gender: female.    BMI: 50.7 kg/m^2.  Blood pressure:     124/84 Patient status:  Outpatient.  Study date:  Study date: 06/22/2016. Study time: 01:47 PM.  Location:  [facility]  -------------------------------------------------------------------  ------------------------------------------------------------------- Left ventricle:  The cavity size was normal. Systolic function was normal. The estimated ejection fraction was in the range of 55% to 60%. Wall motion was normal; there were no regional wall motion abnormalities.  Acoustic contrast opacification revealed no evidence ofthrombus. The pulmonary vein flow pattern was normal. Doppler parameters are consistent with abnormal left ventricular relaxation (grade 1 diastolic dysfunction). There was no evidence of elevated ventricular filling pressure by Doppler parameters.  ------------------------------------------------------------------- Aortic valve:   Trileaflet; normal thickness leaflets. Mobility was not restricted.  Doppler:  Transvalvular velocity was within the normal range. There was no stenosis. There was no regurgitation.  ------------------------------------------------------------------- Aorta:  Aortic root: The aortic root was normal in size.  ------------------------------------------------------------------- Mitral valve:   Structurally normal valve.   Mobility was not restricted.  Doppler:  Transvalvular velocity was within the normal range. There was no evidence for stenosis. There was no regurgitation.    Peak gradient (D): 3 mm  Hg.  ------------------------------------------------------------------- Left atrium:  The atrium was normal in size.  ------------------------------------------------------------------- Right ventricle:  The cavity size was normal. Wall thickness was normal. Systolic function was normal.  ------------------------------------------------------------------- Pulmonic valve:   Poorly visualized.  Doppler:  Transvalvular velocity was within the normal range. There was no evidence for stenosis.  ------------------------------------------------------------------- Tricuspid valve:   Structurally normal valve.    Doppler: Transvalvular velocity was within the normal range. There was no regurgitation.  ------------------------------------------------------------------- Pulmonary artery:   The main pulmonary artery was normal-sized. Systolic pressure was within the normal range.  ------------------------------------------------------------------- Right atrium:  The atrium was normal in size.  ------------------------------------------------------------------- Pericardium:  There was no pericardial effusion.  ------------------------------------------------------------------- Systemic veins: Inferior vena cava: The vessel was normal in size. The respirophasic diameter changes were in the normal range (>= 50%), consistent with normal central venous pressure.  ------------------------------------------------------------------- Measurements  Left ventricle                         Value        Reference LV ID, ED, PLAX chordal                45.2  mm     43 - 52 LV ID, ES, PLAX chordal                26.4  mm     23 - 38 LV fx shortening, PLAX chordal         42    %      >=29 LV PW thickness, ED                    9.32  mm     --------- IVS/LV PW ratio, ED                    1.05         <=1.3 Stroke volume, 2D  82    ml     --------- Stroke volume/bsa, 2D                   32    ml/m^2 --------- LV e&', lateral                         9.46  cm/s   --------- LV E/e&', lateral                       8.67         --------- LV e&', medial                          6.96  cm/s   --------- LV E/e&', medial                        11.78        --------- LV e&', average                         8.21  cm/s   --------- LV E/e&', average                       9.99         ---------  Ventricular septum                     Value        Reference IVS thickness, ED                      9.83  mm     ---------  LVOT                                   Value        Reference LVOT ID, S                             21    mm     --------- LVOT area                              3.46  cm^2   --------- LVOT peak velocity, S                  114   cm/s   --------- LVOT mean velocity, S                  21    cm/s   --------- LVOT VTI, S                            23.7  cm     --------- LVOT peak gradient, S                  5     mm Hg  ---------  Aorta                                  Value        Reference Aortic root  ID, ED                     26    mm     ---------  Left atrium                            Value        Reference LA ID, A-P, ES                         37    mm     --------- LA ID/bsa, A-P                         1.45  cm/m^2 <=2.2 LA volume, S                           32.3  ml     --------- LA volume/bsa, S                       12.7  ml/m^2 --------- LA volume, ES, 1-p A4C                 22.5  ml     --------- LA volume/bsa, ES, 1-p A4C             8.8   ml/m^2 --------- LA volume, ES, 1-p A2C                 45.4  ml     --------- LA volume/bsa, ES, 1-p A2C             17.8  ml/m^2 ---------  Mitral valve                           Value        Reference Mitral E-wave peak velocity            82    cm/s   --------- Mitral A-wave peak velocity            101   cm/s   --------- Mitral deceleration time       (H)     250   ms     150 - 230 Mitral peak  gradient, D                3     mm Hg  --------- Mitral E/A ratio, peak                 0.8          ---------  Pulmonary arteries                     Value        Reference PA pressure, S, DP                     25    mm Hg  <=30  Tricuspid valve                        Value        Reference Tricuspid regurg peak velocity         236   cm/s   --------- Tricuspid peak RV-RA gradient  22    mm Hg  ---------  Systemic veins                         Value        Reference Estimated CVP                          3     mm Hg  ---------  Right ventricle                        Value        Reference TAPSE                                  28.7  mm     --------- RV pressure, S, DP                     25    mm Hg  <=30 RV s&', lateral, S                      11.5  cm/s   ---------  Legend: (L)  and  (H)  mark values outside specified reference range.  ------------------------------------------------------------------- Prepared and Electronically Authenticated by  Jerel Balding, MD 2018-02-26T16:12:28          ______________________________________________________________________________________________      Risk Assessment/Calculations       STOP-Bang Score:  5      Physical Exam VS:  BP 112/80   Pulse 98   Ht 5' 5 (1.651 m)   Wt 276 lb 9.6 oz (125.5 kg)   SpO2 96%   BMI 46.03 kg/m        Wt Readings from Last 3 Encounters:  11/03/23 276 lb 9.6 oz (125.5 kg)  04/14/23 276 lb (125.2 kg)  07/27/22 277 lb 4 oz (125.8 kg)    GEN: Well nourished, well developed in no acute distress. Sitting comfortably on the exam table  NECK: No JVD  CARDIAC:  RRR, no murmurs, rubs, gallops. Radial pulses 2+ bilaterally  RESPIRATORY:  Clear to auscultation without rales, wheezing or rhonchi. Normal WOB on room air   ABDOMEN: Soft, non-tender, non-distended EXTREMITIES:  No edema in BLE; No deformity   ASSESSMENT AND PLAN  Diastolic dysfunction - Grade I DD noted on  echocardiogram in 2018  - Patient is euvolemic on exam today.  Denies shortness of breath, orthopnea - Continue spironolactone  25 mg BID, Jardiance 25 mg daily, Lasix  20 mg daily - Creatinine 0.73 and potassium 4.2 in 04/2023  HLD  - Lipids have not been checked since 2019.  At that time, LDL 75 - Ordered lipid panel today - ALT 12 in 04/2023 - Continue Lipitor 40 mg daily  HTN  - BP well-controlled.  Denies dizziness - Continue Lasix  20 mg daily, metoprolol  succinate 75 mg daily, spironolactone  25 mg BID  Type 2 DM  - Managed by PCP.  A1c 8.9 in 04/2023 - On Jardiance 25 mg daily, insulin , metformin   OSA  - Was previously told she had mild sleep apnea about 10 years ago.  Was not started on CPAP at that time. -Reports that she has been more fatigued than usual -Ordered sleep study   Dispo: Follow up in 12 months to establish care with Dr. Raford Fus, [place]  FABIENE Louder, PA-C

## 2023-10-28 NOTE — Progress Notes (Signed)
 [facility] Quality Team Note  Name: Heather Myers Date of Birth: 02/09/1966 MRN: [id number] Date: 10/28/2023  [facility] Quality Team has reviewed this patient's chart, please see recommendations below:  [facility] Quality Other; (CHART REVIEWED, A1C OUT OF RANGE. PATIENT NEEDS URINE MICROALBUMIN/CREATININE RATIO FOR GAP CLOSURE.)

## 2023-11-03 ENCOUNTER — Ambulatory Visit: Admitting: Physician Assistant

## 2023-11-03 ENCOUNTER — Encounter: Payer: Self-pay | Admitting: Cardiology

## 2023-11-03 ENCOUNTER — Ambulatory Visit: Attending: Cardiovascular Disease | Admitting: Cardiology

## 2023-11-03 VITALS — BP 112/80 | HR 98 | Ht 65.0 in | Wt 276.6 lb

## 2023-11-03 DIAGNOSIS — Z794 Long term (current) use of insulin: Secondary | ICD-10-CM

## 2023-11-03 DIAGNOSIS — I10 Essential (primary) hypertension: Secondary | ICD-10-CM

## 2023-11-03 DIAGNOSIS — I5189 Other ill-defined heart diseases: Secondary | ICD-10-CM | POA: Diagnosis not present

## 2023-11-03 DIAGNOSIS — E1169 Type 2 diabetes mellitus with other specified complication: Secondary | ICD-10-CM | POA: Diagnosis not present

## 2023-11-03 DIAGNOSIS — E119 Type 2 diabetes mellitus without complications: Secondary | ICD-10-CM

## 2023-11-03 DIAGNOSIS — G4730 Sleep apnea, unspecified: Secondary | ICD-10-CM | POA: Diagnosis not present

## 2023-11-03 DIAGNOSIS — E785 Hyperlipidemia, unspecified: Secondary | ICD-10-CM

## 2023-11-03 MED ORDER — METOPROLOL SUCCINATE ER 25 MG PO TB24: ORAL_TABLET | ORAL | 3 refills | Status: AC

## 2023-11-03 MED ORDER — ATORVASTATIN CALCIUM 40 MG PO TABS: 40.0000 mg | ORAL_TABLET | Freq: Every day | ORAL | 3 refills | Status: AC

## 2023-11-03 MED ORDER — METOPROLOL SUCCINATE ER 50 MG PO TB24: ORAL_TABLET | ORAL | 3 refills | Status: AC

## 2023-11-03 MED ORDER — SPIRONOLACTONE 25 MG PO TABS: 25.0000 mg | ORAL_TABLET | Freq: Two times a day (BID) | ORAL | 3 refills | Status: AC

## 2023-11-03 MED ORDER — FUROSEMIDE 20 MG PO TABS: 20.0000 mg | ORAL_TABLET | Freq: Every day | ORAL | 3 refills | Status: AC

## 2023-11-03 NOTE — Progress Notes (Signed)
 Patient agreement reviewed and signed on 11/03/2023.  WatchPAT issued to patient on 11/03/2023 by Leontine Fess, CMA. Patient aware to not open the WatchPAT box until contacted with the activation PIN. Patient profile initialized in CloudPAT on 11/03/2023 by Connell Boers, LPN. Device serial number: [id number]

## 2023-11-08 ENCOUNTER — Ambulatory Visit: Payer: Self-pay

## 2023-11-08 ENCOUNTER — Encounter: Payer: Self-pay | Admitting: Family Medicine

## 2023-11-08 ENCOUNTER — Ambulatory Visit (INDEPENDENT_AMBULATORY_CARE_PROVIDER_SITE_OTHER): Admitting: Family Medicine

## 2023-11-08 VITALS — BP 146/62 | HR 92 | Temp 98.3°F | Wt 276.0 lb

## 2023-11-08 DIAGNOSIS — L259 Unspecified contact dermatitis, unspecified cause: Secondary | ICD-10-CM

## 2023-11-08 MED ORDER — TRIAMCINOLONE ACETONIDE 0.1 % EX CREA: 1.0000 | TOPICAL_CREAM | Freq: Two times a day (BID) | CUTANEOUS | 0 refills | Status: DC

## 2023-11-08 MED ORDER — PREDNISONE 10 MG PO TABS
ORAL_TABLET | ORAL | 0 refills | Status: DC
Start: 2023-11-08 — End: 2024-03-15

## 2023-11-08 NOTE — Progress Notes (Signed)
 Established Patient Office Visit  Subjective   Patient ID: Heather Myers, female    DOB: 09-27-1965  Age: 58 y.o. MRN: [id number]  Chief Complaint  Patient presents with   Rash    HPI   Heather Myers is seen with right ankle rash for past couple days.  She states that she had ankle injury recently with orthopedics and had some type of orthopedic boot on.  She noticed a lot of heat and not long after she placed the boot on noticed the rash.  She does have type 2 diabetes.  Last A1c 7.6%.  Followed by endocrinologist.  Rash is very pruritic.  She had been using some Neosporin and hydrocortisone cream without relief.  No fever.  Past Medical History:  Diagnosis Date   Allergic rhinitis    Anemia    Anxiety    Arthritis    Asthma, mild intermittent 12/19/2015   uses inhaler as needed   Complication of anesthesia    difficulty waking up from anesthesia   Depression    Diabetes mellitus type 2 in obese    Echocardiogram shows left ventricular diastolic dysfunction    Fluid retention    Hyperlipidemia    on meds   Hypertension    on meds   Morbid obesity (HCC)    Pneumonia    history of   Seasonal allergies    Sleep apnea    mild sleep, no CPAP needed at this time   Past Surgical History:  Procedure Laterality Date   ABDOMINAL SURGERY     chronic abd wall abscess   CESAREAN SECTION     x's 2   CHOLECYSTECTOMY     DILATATION & CURETTAGE/HYSTEROSCOPY WITH MYOSURE N/A 12/08/2016   Procedure: DILATATION & CURETTAGE/HYSTEROSCOPY WITH MYOSURE;  Surgeon: Heather Kate Norris, MD;  Location: [facility];  Service: Gynecology;  Laterality: N/A;   INTRAUTERINE DEVICE (IUD) INSERTION N/A 12/08/2016   Procedure: MIRENA  INTRAUTERINE DEVICE (IUD) INSERTION (IUD FROM OFFICE);  Surgeon: Jertson, Jill Evelyn, MD;  Location: [facility];  Service: Gynecology;  Laterality: N/A;   KNEE ARTHROSCOPY Right    LIPOMA EXCISION  08/03/2011   Procedure: EXCISION LIPOMA;  Surgeon: Heather DELENA Poli, MD;   Location: [facility];  Service: General;  Laterality: Right;  wide excision chronic abscess Right lower abdominal wall   OOPHORECTOMY     R ovary removed at [facility]      reports that she has never smoked. She has never used smokeless tobacco. She reports that she does not currently use alcohol. She reports that she does not use drugs. family history includes Cancer in her father; Diabetes in her mother and another family member; Hypertension in her father and mother; Other in her mother. Allergies  Allergen Reactions   Ramipril Shortness Of Breath    Review of Systems  Constitutional:  Negative for chills and fever.      Objective:     BP (!) 146/62 (BP Location: Left Arm, Patient Position: Sitting, Cuff Size: Large)   Pulse 92   Temp 98.3 F (36.8 C) (Oral)   Wt 276 lb (125.2 kg)   SpO2 98%   BMI 45.93 kg/m  BP Readings from Last 3 Encounters:  11/08/23 (!) 146/62  11/03/23 112/80  05/13/23 114/76   Wt Readings from Last 3 Encounters:  11/08/23 276 lb (125.2 kg)  11/03/23 276 lb 9.6 oz (125.5 kg)  04/14/23 276 lb (125.2 kg)  Physical Exam Vitals reviewed.  Constitutional:      General: She is not in acute distress.    Appearance: She is not ill-appearing.  Cardiovascular:     Rate and Rhythm: Normal rate and regular rhythm.  Skin:    Findings: Rash present.     Comments: She has macular erythematous rash with slightly vesiculated surface mostly medial right ankle with some lateral involvement as well.  No warmth.  No pustulosis.  Neurological:     Mental Status: She is alert.      No results found for any visits on 11/08/23.    The ASCVD Risk score (Arnett DK, et al., 2019) failed to calculate for the following reasons:   Cannot find a previous HDL lab   Cannot find a previous total cholesterol lab    Assessment & Plan:   Probable contact dermatitis rash right ankle.  Probably related to recent orthopedic boot.  No signs of  cellulitis.  -Triamcinolone  0.1% cream twice daily - We discussed the fact that systemic steroids would likely be the most effective for treating her likely contact dermatitis rash.  She is diabetic and she is aware of increased risk of worsening diabetic control and steroids.  She will monitor closely.  Start prednisone  taper 40 mg daily and taper over 12 days  Heather Scarlet, MD

## 2023-11-08 NOTE — Telephone Encounter (Signed)
  FYI Only or Action Required?: FYI only for provider.  Patient was last seen in primary care on 04/14/2023 by [place], Betty G, MD.  Called Nurse Triage reporting Rash.  Symptoms began a week ago.  Interventions attempted: Nothing.  Symptoms are: unchanged.  Triage Disposition: No disposition on file.  Patient/caregiver understands and will follow disposition?:      Copied from CRM [id number]. Topic: Clinical - Red Word Triage >> Nov 08, 2023  8:14 AM Cleave MATSU wrote: Red Word that prompted transfer to Nurse Triage: severe outbreak on ankle very hot to the touch. Reason for Disposition  [1] Looks infected (e.g., spreading redness, pus) AND [2] no fever  Answer Assessment - Initial Assessment Questions 1. CALLER DIAGNOSIS: What do you think is causing the rash? (e.g., athlete's foot, chickenpox, hives, impetigo)     Started last Tuesday; looked like a heat rash, on right ankle and very hot to the touch 2. LOCALIZED OR WIDESPREAD:  Is the rash all over (widespread) or mostly just in one area of the body (localized)?      Right ankle 3. NEW MEDICINES: Are you taking any new medicine?     Na but states uses a brace to that ankle 4. APPEARANCE of RASH: What does the rash look like? What color is it?  Note: It is difficult to assess rash color in people with darker-colored skin. When this situation occurs, simply ask the caller to describe what they see.     Blisters, without drainage 5. FEVER: Do you have a fever? If Yes, ask: What is your temperature, how was it measured, and when did it start?     no 6. PREGNANCY: Is there any chance you are pregnant? When was your last menstrual period?     na  Answer Assessment - Initial Assessment Questions 1. APPEARANCE of RASH: What does the rash look like? (e.g., blisters, dry flaky skin, red spots, redness, sores)     Red and blistery 2. LOCATION: Where is the rash located?      Right ankle 3. NUMBER: How many spots  are there?      many 4. SIZE: How big are the spots? (e.g., inches, cm; or compare to size of pinhead, tip of pen, eraser, pea)      varies 5. ONSET: When did the rash start?      Last week 6. ITCHING: Does the rash itch? If Yes, ask: How bad is the itch?  (Scale 0-10; or none, mild, moderate, severe)     na 7. PAIN: Does the rash hurt? If Yes, ask: How bad is the pain?  (Scale 0-10; or none, mild, moderate, severe)     Sore to touch and warm 8. OTHER SYMPTOMS: Do you have any other symptoms? (e.g., fever)     na 9. PREGNANCY: Is there any chance you are pregnant? When was your last menstrual period?     na  Protocols used: Rash - Guideline Selection-A-AH, Rash or Redness - Localized-A-AH

## 2023-11-11 ENCOUNTER — Ambulatory Visit: Admitting: Orthopedic Surgery

## 2023-11-11 DIAGNOSIS — E1165 Type 2 diabetes mellitus with hyperglycemia: Secondary | ICD-10-CM

## 2023-11-11 DIAGNOSIS — M65312 Trigger thumb, left thumb: Secondary | ICD-10-CM

## 2023-11-11 LAB — POCT GLYCOSYLATED HEMOGLOBIN (HGB A1C)

## 2023-11-11 NOTE — Addendum Note (Signed)
 Addended by: Holly Iannaccone G on: 11/11/2023 11:07 AM   Modules accepted: Orders

## 2023-11-11 NOTE — Progress Notes (Signed)
 Heather Myers - 58 y.o. female MRN [id number]  Date of birth: [birth year]  Office Visit Note: Visit Date: 11/11/2023 PCP: [place], Betty G, MD Referred by: Persons, Ronal Dragon, PA  Subjective: No chief complaint on file.  HPI: Heather Myers is a pleasant 58 y.o. female who presents today for left thumb trigger digit that has been present now for multiple years, has recurred more significantly over the past few months.  Underwent injection a few months prior without significant relief.  States that she has ongoing clicking and catching of the digits, is hesitant to perform thumb IP motion at this time.  She has been utilizing a brace as well.  She has diabetic, recent A1c was 8.9 however she states that it was tested at an outside facility more recently and she believes it was below 8.  Pertinent ROS were reviewed with the patient and found to be negative unless otherwise specified above in HPI.   Visit Reason:Left trigger thumb Duration of symptoms:had it 25 years ago and had injections but returned 3-4 months ago Hand dominance: right Occupation: Owns a [employer] Diabetic: Yes-8.9 Smoking: No Heart/Lung History:none Blood Thinners: none  Prior Testing/EMG:xrays in epic Injections (Date):inj done on 5/2 by maryann Treatments:none Prior Surgery:none  Assessment & Plan: Visit Diagnoses:  1. Trigger thumb, left thumb     Plan: Extensive discussion was had with the patient today regarding her left thumb trigger digit.  We discussed the etiology and pathophysiology of stenosing tenosynovitis.  We discussed conservative versus surgical treatment modalities.  From a conservative standpoint, we discussed activity modification, splinting, therapy and injections.  From a surgical standpoint, we discussed the possibility for trigger digit release as well as all risk and benefits associated.  Given that patient has trialed conservative treatments such as prior  injections and activity modification with bracing with symptoms refractory to conservative care, patient is indicated for left thumb trigger digit release.    Risks and benefits of the procedure were discussed, risks including but not limited to infection, bleeding, scarring, stiffness, nerve injury, tendon injury, vascular injury, recurrence of symptoms and need for subsequent operation.  We also discussed the appropriate postoperative protocol and timeframe for return to activities and function.  Forms of anesthesia were also discussed.  Patient expressed understanding.  Understanding the above, she would like to proceed with left thumb trigger digit release under local anesthesia at the next available date.  I did state that we would like her A1c to be below at least 8.5 preoperatively, this will be retested today.  Follow-up: No follow-ups on file.   Meds & Orders: No orders of the defined types were placed in this encounter.  No orders of the defined types were placed in this encounter.    Procedures: No procedures performed      Clinical History: No specialty comments available.  She reports that she has never smoked. She has never used smokeless tobacco.  Recent Labs    05/13/23 1533  HGBA1C 8.9*    Objective:   Vital Signs: There were no vitals taken for this visit.  Physical Exam  Gen: Well-appearing, in no acute distress; non-toxic CV: Regular Rate. Well-perfused. Warm.  Resp: Breathing unlabored on room air; no wheezing. Psych: Fluid speech in conversation; appropriate affect; normal thought process  Ortho Exam Left hand: - Palpable nodule at the A1 pulley of the thumb, associated tenderness - Notable clicking with deep flexion of the thumb, IP range of motion  is limited 0-35 - Sensation intact distally, hand remains warm well-perfused   Imaging: No results found.  Past Medical/Family/Surgical/Social History: Medications & Allergies reviewed per EMR, new  medications updated. Patient Active Problem List   Diagnosis Date Noted   Morbid obesity with body mass index (BMI) of 45.0 to 49.9 in adult [facility]) 07/13/2022   Uncontrolled type 2 diabetes mellitus with hyperglycemia (HCC) 06/10/2018   Eczema 08/11/2017   Unilateral primary osteoarthritis, left knee 11/05/2016   Insulin  dependent type 2 diabetes mellitus, uncontrolled 12/19/2015   Asthma, mild intermittent 12/19/2015   Bilateral lower extremity edema 12/19/2015   DJD (degenerative joint disease) of knee 06/29/2013   Dysmenorrhea 03/30/2013   Dyspnea 06/11/2010   Morbid obesity (HCC) 12/21/2008   Palpitations 12/21/2008   GENITAL HERPES 03/22/2007   Hyperlipidemia associated with type 2 diabetes mellitus (HCC) 03/22/2007   Iron deficiency anemia 03/22/2007   Anxiety disorder 03/22/2007   DEPRESSION 03/22/2007   Essential hypertension 03/22/2007   ALLERGIC RHINITIS 03/22/2007   DISC DISEASE, CERVICAL 03/22/2007   POSITIVE PPD 03/22/2007   Past Medical History:  Diagnosis Date   Allergic rhinitis    Anemia    Anxiety    Arthritis    Asthma, mild intermittent 12/19/2015   uses inhaler as needed   Complication of anesthesia    difficulty waking up from anesthesia   Depression    Diabetes mellitus type 2 in obese    Echocardiogram shows left ventricular diastolic dysfunction    Fluid retention    Hyperlipidemia    on meds   Hypertension    on meds   Morbid obesity (HCC)    Pneumonia    history of   Seasonal allergies    Sleep apnea    mild sleep, no CPAP needed at this time   Family History  Problem Relation Age of Onset   Diabetes Mother    Hypertension Mother    Other Mother        renal failure   Hypertension Father    Cancer Father    Diabetes Other    Colon polyps Neg Hx    Colon cancer Neg Hx    Esophageal cancer Neg Hx    Rectal cancer Neg Hx    Stomach cancer Neg Hx    Past Surgical History:  Procedure Laterality Date   ABDOMINAL SURGERY      chronic abd wall abscess   CESAREAN SECTION     x's 2   CHOLECYSTECTOMY     DILATATION & CURETTAGE/HYSTEROSCOPY WITH MYOSURE N/A 12/08/2016   Procedure: DILATATION & CURETTAGE/HYSTEROSCOPY WITH MYOSURE;  Surgeon: Jannis Kate Norris, MD;  Location: [facility];  Service: Gynecology;  Laterality: N/A;   INTRAUTERINE DEVICE (IUD) INSERTION N/A 12/08/2016   Procedure: MIRENA  INTRAUTERINE DEVICE (IUD) INSERTION (IUD FROM OFFICE);  Surgeon: Jertson, Jill Evelyn, MD;  Location: [facility];  Service: Gynecology;  Laterality: N/A;   KNEE ARTHROSCOPY Right    LIPOMA EXCISION  08/03/2011   Procedure: EXCISION LIPOMA;  Surgeon: Vicenta DELENA Poli, MD;  Location: [facility];  Service: General;  Laterality: Right;  wide excision chronic abscess Right lower abdominal wall   OOPHORECTOMY     R [facility]     Social History   Occupational History   Not on file  Tobacco Use   Smoking status: Never   Smokeless tobacco: Never  Vaping Use   Vaping status: Never Used  Substance and Sexual Activity  Alcohol use: Not Currently   Drug use: No   Sexual activity: Yes    Partners: Male    Birth control/protection: I.U.D.    Comment: mirena  inserted 01-26-17    Noble Bodie Estela) Arlinda, M.D. [facility], Hand Surgery

## 2023-12-08 DIAGNOSIS — E1165 Type 2 diabetes mellitus with hyperglycemia: Secondary | ICD-10-CM | POA: Diagnosis not present

## 2023-12-08 DIAGNOSIS — Z794 Long term (current) use of insulin: Secondary | ICD-10-CM | POA: Diagnosis not present

## 2023-12-08 DIAGNOSIS — E1169 Type 2 diabetes mellitus with other specified complication: Secondary | ICD-10-CM | POA: Diagnosis not present

## 2023-12-08 LAB — HEMOGLOBIN A1C: Hemoglobin A1C: 7.7

## 2023-12-08 LAB — PROTEIN / CREATININE RATIO, URINE
Albumin, U: <7
Creatinine, Urine: 41

## 2024-01-14 ENCOUNTER — Telehealth: Payer: Self-pay

## 2024-01-14 NOTE — Telephone Encounter (Signed)
 Ordering provider: Rollo Louder Associated diagnoses: Sleep apnea, unspecified type (G47.30)  WatchPAT PA obtained on 01/14/2024 by Dena JAYSON Hesselbach, CMA. Authorization: Prior Authorization was required per [facility] Authorization number is [id number]. Patient notified of PIN (1234) on 01/14/2024 via Notification Method: MyChart message.  Phone note routed to covering staff for follow-up.

## 2024-02-28 ENCOUNTER — Encounter: Payer: Self-pay | Admitting: Radiology

## 2024-03-15 ENCOUNTER — Encounter: Payer: Self-pay | Admitting: Family Medicine

## 2024-03-15 ENCOUNTER — Ambulatory Visit: Payer: Self-pay

## 2024-03-15 ENCOUNTER — Ambulatory Visit (INDEPENDENT_AMBULATORY_CARE_PROVIDER_SITE_OTHER)

## 2024-03-15 ENCOUNTER — Ambulatory Visit: Admitting: Family Medicine

## 2024-03-15 VITALS — BP 138/68 | HR 91 | Temp 98.1°F | Resp 18 | Ht 65.0 in | Wt 275.2 lb

## 2024-03-15 DIAGNOSIS — M545 Low back pain, unspecified: Secondary | ICD-10-CM | POA: Diagnosis not present

## 2024-03-15 DIAGNOSIS — W19XXXA Unspecified fall, initial encounter: Secondary | ICD-10-CM

## 2024-03-15 DIAGNOSIS — H9193 Unspecified hearing loss, bilateral: Secondary | ICD-10-CM | POA: Diagnosis not present

## 2024-03-15 DIAGNOSIS — H6993 Unspecified Eustachian tube disorder, bilateral: Secondary | ICD-10-CM

## 2024-03-15 MED ORDER — METHOCARBAMOL 500 MG PO TABS: 500.0000 mg | ORAL_TABLET | Freq: Three times a day (TID) | ORAL | 0 refills | Status: DC | PRN

## 2024-03-15 MED ORDER — HYDROCODONE-ACETAMINOPHEN 5-325 MG PO TABS: 1.0000 | ORAL_TABLET | Freq: Two times a day (BID) | ORAL | 0 refills | Status: AC | PRN

## 2024-03-15 MED ORDER — CELECOXIB 100 MG PO CAPS: 100.0000 mg | ORAL_CAPSULE | Freq: Two times a day (BID) | ORAL | 0 refills | Status: AC

## 2024-03-15 NOTE — Progress Notes (Signed)
 "  ACUTE VISIT Chief Complaint  Patient presents with   Back Pain    Clemens off shower chair last Friday R sided   Ear Fullness   Discussed the use of AI scribe software for clinical note transcription with the patient, who gave verbal consent to proceed.  History of Present Illness Heather Myers is a 58 year old female a PMHx significant for HTN, asthma, HLD, DM II, allergic rhinitis, DJD, eczema, OA, anxiety, depression, and iron deficiency anemia who presents with back pain following a fall in the shower.  She experienced back pain after a fall in the shower last Friday when a shower bench fell apart, causing her to hit her back against the shower wall. The pain is primarily located on the right side of her back, described as throbbing and constant, with a pain level of 8.5 out of 10, initially a 10 out of 10. She has been using ice packs every two hours for thirty minutes and applying heat twice a day. She took methocarbamol , which she took in the past for back pain, one tablet every eight hours, which helped but caused drowsiness, and she has run out of this medication. She has also taken Flexeril , Meloxicam ,and tramadol  in the past. Negative for saddle anesthesia or bladder/bowel dysfunction.  In the past two days, she has experienced shooting pain radiating to her right buttock and the back of her hip. No numbness or tingling in LE's is present. The pain worsens when sitting or lying down and improves when standing. She has a history of back pain from 20-25 years ago, possibly related to a disc issue, but has not had recent back problems until this incident. Lab Results  Component Value Date   NA 139 05/13/2023   CL 103 05/13/2023   K 4.2 05/13/2023   CO2 24 05/13/2023   BUN 13 05/13/2023   CREATININE 0.73 05/13/2023   GFR 95.09 10/22/2021   CALCIUM  9.9 05/13/2023   ALBUMIN 4.4 10/22/2021   GLUCOSE 156 (H) 05/13/2023   -Additionally, she reports bilateral ear fullness  for a couple months with a slight change in hearing over the past month, requiring people to repeat themselves. No ear pain, recent URI's, travel, drainage, or ringing in the ears. She has noticed a lot of dead skin in her ears but no fluid discharge.  Review of Systems  Constitutional:  Positive for activity change. Negative for appetite change, chills and fever.  HENT:  Negative for congestion and sore throat.   Respiratory:  Negative for cough and wheezing.   Gastrointestinal:  Negative for abdominal pain, nausea and vomiting.  Genitourinary:  Negative for decreased urine volume, dysuria and hematuria.  Musculoskeletal:  Positive for back pain.  Skin:  Negative for rash.  Neurological:  Negative for syncope and weakness.  See other pertinent positives and negatives in HPI.  Current Outpatient Medications on File Prior to Visit  Medication Sig Dispense Refill   atorvastatin  (LIPITOR) 40 MG tablet Take 1 tablet (40 mg total) by mouth daily. 90 tablet 3   BAYER MICROLET LANCETS lancets Use as instructed to check sugar up to 6 times daily 600 each 5   Blood Glucose Monitoring Suppl (BAYER CONTOUR MONITOR) w/Device KIT Use to check sugar daily 1 each 0   citalopram  (CELEXA ) 40 MG tablet TAKE 1 TABLET BY MOUTH EVERY DAY 90 tablet 2   Continuous Blood Gluc Sensor (DEXCOM G7 SENSOR) MISC Apply topically as directed.     Estradiol  (YUVAFEM )  10 MCG TABS vaginal tablet Place one tablet (10 mcg) per vagina at bedtime twice a week. 8 tablet 0   fluticasone  (FLONASE ) 50 MCG/ACT nasal spray Place 1 spray into both nostrils 2 (two) times daily. 16 g 1   furosemide  (LASIX ) 20 MG tablet Take 1 tablet (20 mg total) by mouth daily. 90 tablet 3   glucose blood (BAYER CONTOUR TEST) test strip Use as instructed to check sugar 6 times daily 600 each 5   insulin  aspart (NOVOLOG  FLEXPEN) 100 UNIT/ML FlexPen INJECT 25-30 UNITS 3 TIMES A DAY *15 MINUTES BEFORE MEALS* 24 mL 1   Insulin  Pen Needle 31G X 5 MM MISC Use  4x a day 400 each 3   JARDIANCE 25 MG TABS tablet Take 25 mg by mouth daily.     metFORMIN  (GLUCOPHAGE -XR) 500 MG 24 hr tablet TAKE 2 TABLETS BY MOUTH TWICE A DAY 360 tablet 0   metoprolol  succinate (TOPROL -XL) 25 MG 24 hr tablet TAKE 1 TABLET BY MOUTH EVERY DAY WITH 50MG  TAB FOR A TOTAL OF 75MG  90 tablet 3   metoprolol  succinate (TOPROL -XL) 50 MG 24 hr tablet TAKE 1 TAB BY MOUTH DAILY WITH 25 MG TABLET FOR TOTAL OF 75 MG WITH OR IMMEDIATELY FOLLOWING A MEAL. 90 tablet 3   Multiple Vitamins-Minerals (MULTIVITAMIN WITH MINERALS) tablet Take 1 tablet by mouth daily.     spironolactone  (ALDACTONE ) 25 MG tablet Take 1 tablet (25 mg total) by mouth 2 (two) times daily. 180 tablet 3   tirzepatide (MOUNJARO) 12.5 MG/0.5ML Pen Inject 12.5 mg into the skin once a week.     TOUJEO  SOLOSTAR 300 UNIT/ML SOPN INJECT 75 UNITS INTO THE SKIN DAILY (Patient taking differently: Inject 80 Units into the skin daily.) 22.5 mL 2   valACYclovir  (VALTREX ) 500 MG tablet Take one tablet daily, increase to one tablet po BID x 3 days prn outbreak 90 tablet 4   No current facility-administered medications on file prior to visit.    Past Medical History:  Diagnosis Date   Allergic rhinitis    Anemia    Anxiety    Arthritis    Asthma, mild intermittent 12/19/2015   uses inhaler as needed   Complication of anesthesia    difficulty waking up from anesthesia   Depression    Diabetes mellitus type 2 in obese    Echocardiogram shows left ventricular diastolic dysfunction    Fluid retention    Hyperlipidemia    on meds   Hypertension    on meds   Morbid obesity (HCC)    Pneumonia    history of   Seasonal allergies    Sleep apnea    mild sleep, no CPAP needed at this time   Allergies  Allergen Reactions   Ramipril Shortness Of Breath    Social History   Socioeconomic History   Marital status: Divorced    Spouse name: Not on file   Number of children: Not on file   Years of education: Not on file    Highest education level: Not on file  Occupational History   Not on file  Tobacco Use   Smoking status: Never   Smokeless tobacco: Never  Vaping Use   Vaping status: Never Used  Substance and Sexual Activity   Alcohol use: Not Currently   Drug use: No   Sexual activity: Yes    Partners: Male    Birth control/protection: I.U.D.    Comment: mirena  inserted 01-26-17  Other Topics Concern  Not on file  Social History Narrative   Not on file   Social Drivers of Health   Financial Resource Strain: Not on file  Food Insecurity: Low Risk  (02/10/2023)   Received from [facility]   Hunger Vital Sign    Within the past 12 months, you worried that your food would run out before you got money to buy more: Never true    Within the past 12 months, the food you bought just didn't last and you didn't have money to get more. : Never true  Transportation Needs: No Transportation Needs (02/10/2023)   Received from [facility]    In the past 12 months, has lack of reliable transportation kept you from medical appointments, meetings, work or from getting things needed for daily living? : No  Physical Activity: Not on file  Stress: Not on file  Social Connections: Not on file   Vitals:   03/15/24 1446  BP: 138/68  Pulse: 91  Resp: 18  Temp: 98.1 F (36.7 C)  SpO2: 95%   Body mass index is 45.8 kg/m.  Physical Exam Vitals and nursing note reviewed.  Constitutional:      General: She is not in acute distress.    Appearance: She is well-developed. She is not ill-appearing.  HENT:     Head: Normocephalic and atraumatic.     Right Ear: Tympanic membrane, ear canal and external ear normal.     Left Ear: Tympanic membrane, ear canal and external ear normal.     Mouth/Throat:     Mouth: Mucous membranes are moist.     Pharynx: Oropharynx is clear.  Eyes:     Conjunctiva/sclera: Conjunctivae normal.  Cardiovascular:     Rate and Rhythm: Normal rate and regular  rhythm.     Heart sounds: No murmur heard. Pulmonary:     Effort: Pulmonary effort is normal. No respiratory distress.     Breath sounds: Normal breath sounds.  Abdominal:     Palpations: Abdomen is soft. There is no mass.     Tenderness: There is no abdominal tenderness.  Musculoskeletal:     Lumbar back: No tenderness or bony tenderness. Negative right straight leg raise test and negative left straight leg raise test.     Comments: No significant deformity appreciated. Pain elicited with movement on exam table during examination. No local edema or erythema appreciated, no suspicious lesions.  Lymphadenopathy:     Cervical: No cervical adenopathy.  Skin:    General: Skin is warm.     Findings: No erythema or rash.  Neurological:     General: No focal deficit present.     Mental Status: She is alert and oriented to person, place, and time.     Deep Tendon Reflexes:     Reflex Scores:      Patellar reflexes are 2+ on the right side and 2+ on the left side.    Comments: Antalgic gait, not assisted.  Psychiatric:        Mood and Affect: Mood and affect normal.    ASSESSMENT AND PLAN:  Ms. Fessenden was seen today for back pain after a fall and ear discomfort.  Acute right-sided low back pain, unspecified whether sciatica present After fall in the shower. Lumbar X ray ordered today. Hydrocodone -Acetaminophen  5-325 mg at bedtime for pain and during the day Celebrex  bid x 7-10 days. Topical Biofreeze may help. Some side effects of medications discussed. Monitor for new symptoms. Continue mobility  as tolerated.  -     DG Lumbar Spine Complete; Future -     Celecoxib ; Take 1 capsule (100 mg total) by mouth 2 (two) times daily for 10 days.  Dispense: 20 capsule; Refill: 0 -     HYDROcodone -Acetaminophen ; Take 1 tablet by mouth 2 (two) times daily as needed for up to 5 days for moderate pain (pain score 4-6).  Dispense: 10 tablet; Refill: 0 -     Methocarbamol ; Take 1 tablet  (500 mg total) by mouth every 8 (eight) hours as needed for up to 14 days for muscle spasms.  Dispense: 45 tablet; Refill: 0  Fall, initial encounter Fall precautions discussed.  -     DG Lumbar Spine Complete; Future  Dysfunction of both eustachian tubes We discussed Dx, prognosis,and treatment options. Autoinflation maneuvers a few times per day recommended. Because some associated hearing loss and problem has been going on for 1-2 months, ENT referral palced.  -     Ambulatory referral to ENT  Bilateral hearing loss, unspecified hearing loss type Mild.Conductive pattern. Instructed about warning signs. ENT appt with be arranged.  Hearing Screening   500Hz  1000Hz  2000Hz  4000Hz   Right ear Fail Fail Pass Pass  Left ear Fail Fail Pass Pass   -     Ambulatory referral to ENT  Return if symptoms worsen or fail to improve, for keep next appointment.  Rui Wordell G. Kanen Mottola, MD  [facility] [place] office.  "

## 2024-03-15 NOTE — Patient Instructions (Addendum)
 A few things to remember from today's visit:  Acute right-sided low back pain, unspecified whether sciatica present - Plan: DG Lumbar Spine Complete, celecoxib (CELEBREX) 100 MG capsule, HYDROcodone -acetaminophen  (NORCO/VICODIN) 5-325 MG tablet, methocarbamol  (ROBAXIN ) 500 MG tablet  Fall, initial encounter - Plan: DG Lumbar Spine Complete  Dysfunction of both eustachian tubes  Celebrex 2 times daily for 10 days. Muscle relaxant as needed, causes drowsiness. Monitor for new symptoms. Hydrocodone -Acetaminophen  at bedtime.  If you need refills for medications you take chronically, please call your pharmacy. Do not use My Chart to request refills or for acute issues that need immediate attention. If you send a my chart message, it may take a few days to be addressed, specially if I am not in the office.  Please be sure medication list is accurate. If a new problem present, please set up appointment sooner than planned today.

## 2024-03-15 NOTE — Telephone Encounter (Signed)
 FYI Only or Action Required?: FYI only for provider: appointment scheduled on 03/15/24.  Patient was last seen in primary care on 11/08/2023 by Heather Myers ORN, MD.  Called Nurse Triage reporting Back Pain.  Symptoms began several days ago.  Interventions attempted: OTC medications: Aspirin, Advil  and Rest, hydration, or home remedies.  Symptoms are: gradually improving.  Triage Disposition: See HCP Within 4 Hours (Or PCP Triage)  Patient/caregiver understands and will follow disposition?: Yes   Copied from CRM [id number]. Topic: Clinical - Red Word Triage >> Mar 15, 2024 10:40 AM [name] wrote: [facility] that prompted transfer to Nurse Triage: Fall. Patient fell off of her shower chair this past Friday, and now having lower right side back pain, stated it is severe. Reason for Disposition  [1] SEVERE pain (e.g., excruciating) AND [2] not improved 2 hours after pain medicine/ice packs  Answer Assessment - Initial Assessment Questions 1. MECHANISM: How did the injury happen? Note: Consider the possibility of domestic violence or elder abuse.     Slipped out of shower chair hitting her lower back on shower wall  2. ONSET: When did the injury happen? (e.g., minutes or hours ago)     Friday 3. LOCATION: What part of the back is injured?     Lower back 4. SEVERITY: Can you move the back normally?     yes 5. PAIN: Is there any pain? If Yes, ask: How bad is the pain? (Scale 0-10; or none, mild, moderate, severe)     Severe but improving since injury, using ASA and Advil  with some effect  6. SIZE: For cuts, bruises, or swelling, ask: How large is it? (e.g., inches or centimeters)     Denies but feels bruised 7. TETANUS: For any breaks in the skin, ask: When was your last tetanus booster?      8. NEUROLOGIC SYMPTOMS: Any weakness or numbness of the arms or legs?     denies 9. OTHER SYMPTOMS: Do you have any other symptoms? (e.g., abdomen pain, blood in urine)      Pain worse with laying down and sitting, feels better to stand  10. PREGNANCY: Is there any chance you are pregnant? When was your last menstrual period?  Protocols used: Back Injury-A-AH

## 2024-03-21 ENCOUNTER — Ambulatory Visit: Payer: Self-pay | Admitting: Family Medicine

## 2024-03-24 ENCOUNTER — Encounter (INDEPENDENT_AMBULATORY_CARE_PROVIDER_SITE_OTHER): Payer: Self-pay

## 2024-03-30 ENCOUNTER — Other Ambulatory Visit: Payer: Self-pay | Admitting: Family Medicine

## 2024-03-30 DIAGNOSIS — M545 Low back pain, unspecified: Secondary | ICD-10-CM

## 2024-04-16 ENCOUNTER — Other Ambulatory Visit: Payer: Self-pay | Admitting: Family Medicine

## 2024-04-16 DIAGNOSIS — F418 Other specified anxiety disorders: Secondary | ICD-10-CM

## 2024-04-17 ENCOUNTER — Encounter (INDEPENDENT_AMBULATORY_CARE_PROVIDER_SITE_OTHER): Payer: Self-pay

## 2024-05-23 ENCOUNTER — Other Ambulatory Visit: Payer: Self-pay | Admitting: Family Medicine

## 2024-05-23 DIAGNOSIS — M545 Low back pain, unspecified: Secondary | ICD-10-CM
